# Patient Record
Sex: Female | Born: 1940 | Race: White | Hispanic: No | Marital: Married | Smoking: Never smoker
Health system: Southern US, Community
[De-identification: ages and names within clinical notes are randomized; demographics above are authoritative.]

## PROBLEM LIST (undated history)

## (undated) DIAGNOSIS — E039 Hypothyroidism, unspecified: Secondary | ICD-10-CM

## (undated) DIAGNOSIS — I350 Nonrheumatic aortic (valve) stenosis: Secondary | ICD-10-CM

## (undated) DIAGNOSIS — K589 Irritable bowel syndrome without diarrhea: Secondary | ICD-10-CM

## (undated) DIAGNOSIS — H3530 Unspecified macular degeneration: Secondary | ICD-10-CM

## (undated) DIAGNOSIS — E079 Disorder of thyroid, unspecified: Secondary | ICD-10-CM

## (undated) DIAGNOSIS — I10 Essential (primary) hypertension: Secondary | ICD-10-CM

## (undated) DIAGNOSIS — R52 Pain, unspecified: Secondary | ICD-10-CM

## (undated) DIAGNOSIS — M1990 Unspecified osteoarthritis, unspecified site: Secondary | ICD-10-CM

## (undated) DIAGNOSIS — E785 Hyperlipidemia, unspecified: Secondary | ICD-10-CM

## (undated) DIAGNOSIS — E538 Deficiency of other specified B group vitamins: Secondary | ICD-10-CM

## (undated) DIAGNOSIS — R011 Cardiac murmur, unspecified: Secondary | ICD-10-CM

## (undated) DIAGNOSIS — O039 Complete or unspecified spontaneous abortion without complication: Secondary | ICD-10-CM

## (undated) HISTORY — DX: Cardiac murmur, unspecified: R01.1

## (undated) HISTORY — PX: EYE SURGERY: SHX253

## (undated) HISTORY — DX: Disorder of thyroid, unspecified: E07.9

## (undated) HISTORY — DX: Hyperlipidemia, unspecified: E78.5

## (undated) HISTORY — DX: Nonrheumatic aortic (valve) stenosis: I35.0

## (undated) HISTORY — DX: Irritable bowel syndrome, unspecified: K58.9

## (undated) HISTORY — PX: CATARACT EXTRACTION: SUR2

## (undated) HISTORY — DX: Essential (primary) hypertension: I10

## (undated) HISTORY — PX: OTHER SURGICAL HISTORY: SHX169

## (undated) HISTORY — DX: Complete or unspecified spontaneous abortion without complication: O03.9

## (undated) HISTORY — DX: Pain, unspecified: R52

## (undated) HISTORY — PX: DILATION AND CURETTAGE OF UTERUS: SHX78

## (undated) HISTORY — DX: Unspecified macular degeneration: H35.30

## (undated) HISTORY — DX: Deficiency of other specified B group vitamins: E53.8

## (undated) HISTORY — PX: THYROIDECTOMY: SHX17

## (undated) HISTORY — PX: SHOULDER SURGERY: SHX246

---

## 1975-07-11 HISTORY — PX: ABDOMINAL HYSTERECTOMY: SHX81

## 1997-10-23 ENCOUNTER — Inpatient Hospital Stay (HOSPITAL_COMMUNITY): Admission: RE | Admit: 1997-10-23 | Discharge: 1997-10-24 | Payer: Self-pay

## 1997-10-26 ENCOUNTER — Ambulatory Visit (HOSPITAL_COMMUNITY): Admission: RE | Admit: 1997-10-26 | Discharge: 1997-10-26 | Payer: Self-pay

## 1997-11-11 ENCOUNTER — Ambulatory Visit (HOSPITAL_COMMUNITY): Admission: RE | Admit: 1997-11-11 | Discharge: 1997-11-11 | Payer: Self-pay

## 1997-12-24 ENCOUNTER — Other Ambulatory Visit: Admission: RE | Admit: 1997-12-24 | Discharge: 1997-12-24 | Payer: Self-pay | Admitting: Gynecology

## 1998-12-28 ENCOUNTER — Other Ambulatory Visit: Admission: RE | Admit: 1998-12-28 | Discharge: 1998-12-28 | Payer: Self-pay | Admitting: Gynecology

## 1999-01-13 ENCOUNTER — Encounter: Admission: RE | Admit: 1999-01-13 | Discharge: 1999-04-13 | Payer: Self-pay

## 2000-01-06 ENCOUNTER — Other Ambulatory Visit: Admission: RE | Admit: 2000-01-06 | Discharge: 2000-01-06 | Payer: Self-pay | Admitting: Gynecology

## 2000-03-25 ENCOUNTER — Other Ambulatory Visit: Admission: RE | Admit: 2000-03-25 | Discharge: 2000-03-25 | Payer: Self-pay | Admitting: Gynecology

## 2000-04-12 ENCOUNTER — Other Ambulatory Visit: Admission: RE | Admit: 2000-04-12 | Discharge: 2000-04-12 | Payer: Self-pay | Admitting: Gynecology

## 2000-04-12 ENCOUNTER — Encounter (INDEPENDENT_AMBULATORY_CARE_PROVIDER_SITE_OTHER): Payer: Self-pay | Admitting: Specialist

## 2001-02-01 ENCOUNTER — Other Ambulatory Visit: Admission: RE | Admit: 2001-02-01 | Discharge: 2001-02-01 | Payer: Self-pay | Admitting: Gynecology

## 2001-03-14 ENCOUNTER — Other Ambulatory Visit: Admission: RE | Admit: 2001-03-14 | Discharge: 2001-03-14 | Payer: Self-pay | Admitting: Gynecology

## 2001-05-22 ENCOUNTER — Encounter: Admission: RE | Admit: 2001-05-22 | Discharge: 2001-05-22 | Payer: Self-pay | Admitting: Family Medicine

## 2001-05-22 ENCOUNTER — Encounter: Payer: Self-pay | Admitting: Family Medicine

## 2002-02-12 ENCOUNTER — Other Ambulatory Visit: Admission: RE | Admit: 2002-02-12 | Discharge: 2002-02-12 | Payer: Self-pay | Admitting: Gynecology

## 2002-02-27 ENCOUNTER — Ambulatory Visit (HOSPITAL_BASED_OUTPATIENT_CLINIC_OR_DEPARTMENT_OTHER): Admission: RE | Admit: 2002-02-27 | Discharge: 2002-02-27 | Payer: Self-pay | Admitting: Orthopedic Surgery

## 2003-02-17 ENCOUNTER — Other Ambulatory Visit: Admission: RE | Admit: 2003-02-17 | Discharge: 2003-02-17 | Payer: Self-pay | Admitting: Gynecology

## 2004-02-18 ENCOUNTER — Other Ambulatory Visit: Admission: RE | Admit: 2004-02-18 | Discharge: 2004-02-18 | Payer: Self-pay | Admitting: Gynecology

## 2005-02-21 ENCOUNTER — Other Ambulatory Visit: Admission: RE | Admit: 2005-02-21 | Discharge: 2005-02-21 | Payer: Self-pay | Admitting: Gynecology

## 2005-02-27 ENCOUNTER — Ambulatory Visit (HOSPITAL_COMMUNITY): Admission: RE | Admit: 2005-02-27 | Discharge: 2005-02-27 | Payer: Self-pay | Admitting: Specialist

## 2005-02-27 ENCOUNTER — Encounter (INDEPENDENT_AMBULATORY_CARE_PROVIDER_SITE_OTHER): Payer: Self-pay | Admitting: Specialist

## 2005-02-27 ENCOUNTER — Ambulatory Visit (HOSPITAL_BASED_OUTPATIENT_CLINIC_OR_DEPARTMENT_OTHER): Admission: RE | Admit: 2005-02-27 | Discharge: 2005-02-27 | Payer: Self-pay | Admitting: Specialist

## 2005-08-17 ENCOUNTER — Encounter: Admission: RE | Admit: 2005-08-17 | Discharge: 2005-11-15 | Payer: Self-pay | Admitting: Family Medicine

## 2006-02-21 ENCOUNTER — Other Ambulatory Visit: Admission: RE | Admit: 2006-02-21 | Discharge: 2006-02-21 | Payer: Self-pay | Admitting: Gynecology

## 2007-02-25 ENCOUNTER — Other Ambulatory Visit: Admission: RE | Admit: 2007-02-25 | Discharge: 2007-02-25 | Payer: Self-pay | Admitting: Gynecology

## 2007-07-11 HISTORY — PX: DOPPLER ECHOCARDIOGRAPHY: SHX263

## 2008-01-28 ENCOUNTER — Ambulatory Visit: Payer: Self-pay | Admitting: Gastroenterology

## 2008-02-18 ENCOUNTER — Ambulatory Visit: Payer: Self-pay | Admitting: Gastroenterology

## 2008-02-18 ENCOUNTER — Encounter: Payer: Self-pay | Admitting: Gastroenterology

## 2008-02-21 ENCOUNTER — Encounter: Payer: Self-pay | Admitting: Gastroenterology

## 2008-02-24 ENCOUNTER — Other Ambulatory Visit: Admission: RE | Admit: 2008-02-24 | Discharge: 2008-02-24 | Payer: Self-pay | Admitting: Gynecology

## 2008-04-02 ENCOUNTER — Ambulatory Visit: Payer: Self-pay | Admitting: Gynecology

## 2008-08-19 ENCOUNTER — Encounter: Admission: RE | Admit: 2008-08-19 | Discharge: 2008-08-19 | Payer: Self-pay | Admitting: Internal Medicine

## 2008-10-07 ENCOUNTER — Encounter: Admission: RE | Admit: 2008-10-07 | Discharge: 2008-10-07 | Payer: Self-pay | Admitting: Orthopaedic Surgery

## 2009-03-02 ENCOUNTER — Encounter: Payer: Self-pay | Admitting: Gynecology

## 2009-03-02 ENCOUNTER — Ambulatory Visit: Payer: Self-pay | Admitting: Gynecology

## 2009-03-02 ENCOUNTER — Other Ambulatory Visit: Admission: RE | Admit: 2009-03-02 | Discharge: 2009-03-02 | Payer: Self-pay | Admitting: Gynecology

## 2009-04-06 ENCOUNTER — Ambulatory Visit: Payer: Self-pay | Admitting: Gynecology

## 2010-03-03 ENCOUNTER — Other Ambulatory Visit: Admission: RE | Admit: 2010-03-03 | Discharge: 2010-03-03 | Payer: Self-pay | Admitting: Gynecology

## 2010-03-03 ENCOUNTER — Ambulatory Visit: Payer: Self-pay | Admitting: Gynecology

## 2010-03-19 ENCOUNTER — Encounter: Admission: RE | Admit: 2010-03-19 | Discharge: 2010-03-19 | Payer: Self-pay | Admitting: Orthopaedic Surgery

## 2010-07-12 ENCOUNTER — Inpatient Hospital Stay (HOSPITAL_COMMUNITY)
Admission: RE | Admit: 2010-07-12 | Discharge: 2010-07-14 | Payer: Self-pay | Source: Home / Self Care | Attending: Orthopaedic Surgery | Admitting: Orthopaedic Surgery

## 2010-07-12 HISTORY — PX: TOTAL KNEE ARTHROPLASTY: SHX125

## 2010-07-13 LAB — CBC
HCT: 29.1 % — ABNORMAL LOW (ref 36.0–46.0)
Hemoglobin: 9.8 g/dL — ABNORMAL LOW (ref 12.0–15.0)
MCH: 29.5 pg (ref 26.0–34.0)
MCHC: 33.7 g/dL (ref 30.0–36.0)
MCV: 87.7 fL (ref 78.0–100.0)
Platelets: 264 10*3/uL (ref 150–400)
RBC: 3.32 MIL/uL — ABNORMAL LOW (ref 3.87–5.11)
RDW: 14.1 % (ref 11.5–15.5)
WBC: 7.7 10*3/uL (ref 4.0–10.5)

## 2010-07-13 LAB — BASIC METABOLIC PANEL
BUN: 12 mg/dL (ref 6–23)
CO2: 31 mEq/L (ref 19–32)
Calcium: 8.5 mg/dL (ref 8.4–10.5)
Chloride: 102 mEq/L (ref 96–112)
Creatinine, Ser: 0.74 mg/dL (ref 0.4–1.2)
GFR calc Af Amer: >60 mL/min (ref >60)
GFR calc non Af Amer: >60 mL/min (ref >60)
Glucose, Bld: 88 mg/dL (ref 70–99)
Potassium: 4.1 mEq/L (ref 3.5–5.1)
Sodium: 138 mEq/L (ref 135–145)

## 2010-07-13 LAB — HEMOGLOBIN A1C
Hgb A1c MFr Bld: 5.8 % — ABNORMAL HIGH (ref <5.7)
Mean Plasma Glucose: 120 mg/dL — ABNORMAL HIGH (ref <117)

## 2010-07-13 LAB — PROTIME-INR
INR: 1.08 (ref 0.00–1.49)
Prothrombin Time: 14.2 seconds (ref 11.6–15.2)

## 2010-07-13 LAB — GLUCOSE, CAPILLARY
Glucose-Capillary: 90 mg/dL (ref 70–99)
Glucose-Capillary: 121 mg/dL — ABNORMAL HIGH (ref 70–99)
Glucose-Capillary: 130 mg/dL — ABNORMAL HIGH (ref 70–99)

## 2010-07-14 LAB — PROTIME-INR
INR: 1.62 — ABNORMAL HIGH (ref 0.00–1.49)
Prothrombin Time: 19.4 seconds — ABNORMAL HIGH (ref 11.6–15.2)

## 2010-07-14 LAB — BASIC METABOLIC PANEL
BUN: 11 mg/dL (ref 6–23)
CO2: 32 mEq/L (ref 19–32)
Calcium: 8.5 mg/dL (ref 8.4–10.5)
Chloride: 100 mEq/L (ref 96–112)
Creatinine, Ser: 0.8 mg/dL (ref 0.4–1.2)
GFR calc Af Amer: >60 mL/min (ref >60)
GFR calc non Af Amer: >60 mL/min (ref >60)
Glucose, Bld: 116 mg/dL — ABNORMAL HIGH (ref 70–99)
Potassium: 4 mEq/L (ref 3.5–5.1)
Sodium: 137 mEq/L (ref 135–145)

## 2010-07-14 LAB — CBC
HCT: 27.3 % — ABNORMAL LOW (ref 36.0–46.0)
Hemoglobin: 9.1 g/dL — ABNORMAL LOW (ref 12.0–15.0)
MCH: 29.2 pg (ref 26.0–34.0)
MCHC: 33.3 g/dL (ref 30.0–36.0)
MCV: 87.5 fL (ref 78.0–100.0)
Platelets: 247 10*3/uL (ref 150–400)
RBC: 3.12 MIL/uL — ABNORMAL LOW (ref 3.87–5.11)
RDW: 14.3 % (ref 11.5–15.5)
WBC: 11.1 10*3/uL — ABNORMAL HIGH (ref 4.0–10.5)

## 2010-07-14 LAB — GLUCOSE, CAPILLARY
Glucose-Capillary: 106 mg/dL — ABNORMAL HIGH (ref 70–99)
Glucose-Capillary: 117 mg/dL — ABNORMAL HIGH (ref 70–99)

## 2010-08-04 NOTE — Discharge Summary (Addendum)
NAME:  Dana Sandoval, Dana Sandoval              ACCOUNT NO.:  [id number]  MEDICAL RECORD NO.:  [id number]          PATIENT TYPE:  INP  LOCATION:  5006                         FACILITY:  [facility]  PHYSICIAN:  Lubertha Basque. Krissia Schreier, M.D.DATE OF BIRTH:  Nov 14, 1940  DATE OF ADMISSION:  07/12/2010 DATE OF DISCHARGE:  07/15/2010                              DISCHARGE SUMMARY   ADMITTING DIAGNOSES: 1. Right knee end-stage degenerative joint disease. 2. Adult-onset diabetes mellitus. 3. Hypothyroidism. 4. Hypercholesterolemia. 5. Hypertension.  DISCHARGE DIAGNOSES: 1. Right knee end-stage degenerative joint disease. 2. Adult-onset diabetes mellitus. 3. Hypothyroidism. 4. Hypercholesterolemia. 5. Hypertension.  OPERATIONS:  Right total knee replacement.  BRIEF HISTORY:  Dana Sandoval is a patient well known to our practice, 70 years old.  She has had increasing right knee discomfort and pain, trouble ambulating, trouble sleeping at nighttime.  X-rays reveal end- stage DJD, bone-on-bone arthritis.  She has failed antiinflammatory medicines as well as viscosupplementation injections and cortisone.  PERTINENT LABORATORY DATA AND X-RAY FINDINGS:  Last INR 1.62. Hemoglobin 9.1, hematocrit 27.3, and WBCs 11.1.  Sodium 137, glucose 116, BUN 11, and creatinine 0.80.  HOSPITAL COURSE:  She was admitted postoperatively and placed on the preprinted order set sheets for total joint replacement protocol.  Diet was advanced as tolerated.  IV of lactated Ringer's Ancef 1 g q.8 h. x3 doses, Lovenox and Coumadin per pharmacy DVT prophylaxis, and appropriate p.o. and IV pain medications, antiemetics, antispasmodics, physical therapy, weightbearing as tolerated, knee-high TED, incentive spirometer all were incorporated.  [facility] was the agency designated to provide home health, physical therapy and blood draws, and Coumadin management with an INR between 2 and 3 for 14 days' total.  On the first day  postop, her vital signs were stable, blood pressure was normal, temperature 98.  Lungs were clear.  Abdomen was soft.  Right knee with a drain in place.  Normal neurovascular status without sign of infection.  Catheter was discontinued later that day, voiding on her own without difficulty.  On the second day postop, she progressed well with physical therapy, was walking some 90 feet, and was wanting to go home.  Lungs were clear.  Vital signs were stable. Hemoglobin 9.1, INR 1.62.  Dressing was changed.  Drain was pulled. Wound was noted to be benign without sign of infection.  Calf soft and nontender.  Abdomen with positive bowel sounds.  She was passing gas, but had no bowel movement.  CONDITION ON DISCHARGE:  Improved.  FOLLOWUP:  She will remain on a low-sodium, heart-healthy diet. Medicines will be inclusive in the discharge medicine manager summary. Weightbearing as tolerated.  Genevieve Norlander for physical therapy.  May change her dressing daily.  Return to Dr. Nolon Nations office in 2 weeks from the time of surgery for staple removal.  Any sign of infection which includes increasing redness, drainage, or pain, call the office immediately at [phone] for an immediate appointment.     Lindwood Qua, P.A.   ______________________________ Lubertha Basque. Jerl Santos, M.D.    MC/MEDQ  D:  07/14/2010  T:  07/15/2010  Job:  [id number]  Electronically Signed by Casimiro Needle CARNAGHI P.A.  on 07/19/2010 10:31:05 AM Electronically Signed by Marcene Corning M.D. on 08/04/2010 03:15:34 PM

## 2010-09-19 LAB — BASIC METABOLIC PANEL
CO2: 31 mEq/L (ref 19–32)
Calcium: 9 mg/dL (ref 8.4–10.5)
Chloride: 103 mEq/L (ref 96–112)
Creatinine, Ser: 0.69 mg/dL (ref 0.4–1.2)
Glucose, Bld: 87 mg/dL (ref 70–99)
Sodium: 141 mEq/L (ref 135–145)

## 2010-09-19 LAB — CBC
Hemoglobin: 11.9 g/dL — ABNORMAL LOW (ref 12.0–15.0)
MCH: 29 pg (ref 26.0–34.0)
MCHC: 32.9 g/dL (ref 30.0–36.0)
MCV: 88.3 fL (ref 78.0–100.0)

## 2010-09-19 LAB — GLUCOSE, CAPILLARY
Glucose-Capillary: 136 mg/dL — ABNORMAL HIGH (ref 70–99)
Glucose-Capillary: 142 mg/dL — ABNORMAL HIGH (ref 70–99)

## 2010-09-19 LAB — PROTIME-INR
INR: 0.98 (ref 0.00–1.49)
Prothrombin Time: 13.2 seconds (ref 11.6–15.2)

## 2010-09-19 LAB — SURGICAL PCR SCREEN: Staphylococcus aureus: POSITIVE — AB

## 2010-11-25 NOTE — Op Note (Signed)
NAME:  Dana Sandoval, Dana Sandoval                        ACCOUNT NO.:  [id number]   MEDICAL RECORD NO.:  [id number]                   PATIENT TYPE:  AMB   LOCATION:  DSC                                  FACILITY:  [facility]   PHYSICIAN:  Loreta Ave, M.D.              DATE OF BIRTH:  11-03-1940   DATE OF PROCEDURE:  02/27/2002  DATE OF DISCHARGE:                                 OPERATIVE REPORT   PREOPERATIVE DIAGNOSES:  Degenerative arthritis, chondromalacia of the  patella, and medial and lateral meniscus tears, left knee.   POSTOPERATIVE DIAGNOSES:  Degenerative arthritis, chondromalacia of the  patella, and medial and lateral meniscus tears, left knee.   PROCEDURES:  Left knee examination under anesthesia and arthroscopy with  partial medial and lateral meniscectomy with chondroplasty, medial and  lateral compartments.  More extensive chondroplasty, patellofemoral joint.   SURGEON:  Loreta Ave, M.D.   ASSISTANT:  Arlys John D. Petrarca, P.A.-C.   ANESTHESIA:  General.   ESTIMATED BLOOD LOSS:  Minimal.   SPECIMENS:  None.   CULTURES:  None.   COMPLICATIONS:  None.   DRESSING:  Soft compressive.   TOURNIQUET:  Not employed.   DESCRIPTION OF PROCEDURE:  The patient brought to the operating room and  placed on the operating table in supine position.  After adequate anesthesia  had been obtained, tourniquet and leg holder applied, left leg.  Full motion  and stable knee.  Patellofemoral crepitus but without instability.  After  being prepped and draped in the usual sterile fashion, three portals  created, one superolateral, one each medial and lateral peripatellar.  Inflow catheter introduced, knee distended, arthroscope introduced, knee  inspected.  Extensive grade 3 changes, patellofemoral joint, treated with  chondroplasty to a stable surface.  No maltracking, no tethering.  Medial  compartment accessed.  Extensive degenerative tearing, posterior half medial  meniscus.   Saucerized out with baskets, tapered in smoothly with a shaver,  removing most of the posterior half.  Chondroplasty of grade 2 and 3  changes, femoral condyle.  Cruciate ligaments intact.  Lateral compartment  revealed degenerative tearing with a large flap off the posterior half of  the lateral meniscus.  This was saucerized out as well as tearing in the  central part saucerized out, tapered in smoothly.  Remaining meniscus  intact.  Grade 2 changes on the plateau debrided.  Hypertrophic synovitis  removed  throughout.  The entire knee inspected and no other significant findings  appreciated.  Instruments and fluid removed.  Portals and knee injected with  Marcaine.  Sterile compressive dressing applied after portals closed with  nylon.  Anesthesia reversed.  Brought to the recovery room.  Tolerated the  surgery well with no complications.  Loreta Ave, M.D.    DFM/MEDQ  D:  02/27/2002  T:  03/03/2002  Job:  [id number]

## 2010-11-25 NOTE — Op Note (Signed)
NAME:  Dana Sandoval, Dana Sandoval              ACCOUNT NO.:  [id number]   MEDICAL RECORD NO.:  [id number]          PATIENT TYPE:  AMB   LOCATION:  DSC                          FACILITY:  [facility]   PHYSICIAN:  Earvin Hansen L. Truesdale, M.D.DATE OF BIRTH:  11-30-1940   DATE OF PROCEDURE:  02/27/2005  DATE OF DISCHARGE:                                 OPERATIVE REPORT   INDICATIONS:  The patient is a 70 year old lady with enlarging masses about  the inner knee areas. They are large, measuring approximately 12 x 18  inches.  The areas have caused problems with the patient flexion of her knee  as well as causing irritation and rubbing when she walks.  She has had to  use medications in the area including topical steroids, lubricants and talcs  to reduce the friction as she walks.  The areas have gotten to the point of  increased pain and discomfort.   OPERATION/PROCEDURE:  Excision of the areas, liposuction assistance.   ANESTHESIA:  General.   DESCRIPTION OF PROCEDURE:  Preoperatively the patient was stood up and drawn  for the total extent of the masses on the right and left inner knees.  She  then underwent general anesthesia, was intubated orally.  Prep was done to  the thigh, feet, and leg areas with Hibiclens solution, walled off with  sterile towels and drapes so as to make a sterile field.  Xylocaine 0.5%,  1:200,000 concentration was injected locally within each area.  Incisions  were made on the areas and able to remove the areas with liposuction  assistance except for the central core.  The central core seemed to be  lipomatous and was also under the superficial fascia.  Incisions were made  over that with a knife and then using Metzenbaum scissors, sharp and blunt  dissection.  We were then able to dissect out that area which measured  approximately 4 x 6 inches.  Liposuction assistance was then used to remove  the peripheral part and the margins to smooth out the contour.  Same  procedure was  carried out on both sides.  The incisions were closed with 5-0  nylon except one to allow drainage.  The wounds were covered with Xeroform,  4 x 4's, ABDs, Hypafix, and Kerlix wraps, and Ace wraps.  She withstood the  procedures very well and was taken to recovery in excellent condition.  Estimated blood loss was less than 50 mL.  Tissues removed:  Over 1000 mL.      Yaakov Guthrie. Shon Hough, M.D.  Electronically Signed     GLT/MEDQ  D:  02/27/2005  T:  02/27/2005  Job:  [id number]

## 2011-02-14 ENCOUNTER — Other Ambulatory Visit: Payer: Self-pay | Admitting: *Deleted

## 2011-02-14 DIAGNOSIS — Z139 Encounter for screening, unspecified: Secondary | ICD-10-CM

## 2011-02-16 ENCOUNTER — Telehealth: Payer: Self-pay | Admitting: Cardiology

## 2011-02-16 DIAGNOSIS — I350 Nonrheumatic aortic (valve) stenosis: Secondary | ICD-10-CM

## 2011-02-16 NOTE — Telephone Encounter (Signed)
Chart in box 

## 2011-02-16 NOTE — Telephone Encounter (Signed)
Scheduled echo and ov for patient

## 2011-02-16 NOTE — Telephone Encounter (Signed)
Pt was supposed to schedule a 1year OV in March, pt now calling to schedule however pt would like to know if she needs to have a certain test that she doesn't remember what it is called prior to scheduling her new yearly OV, please call patient back to confirm what she will need to do or schedule prior to scheduling a new yearly OV

## 2011-02-24 ENCOUNTER — Encounter: Payer: Self-pay | Admitting: *Deleted

## 2011-03-03 ENCOUNTER — Encounter: Payer: Self-pay | Admitting: Cardiology

## 2011-03-08 ENCOUNTER — Encounter: Payer: Self-pay | Admitting: Gynecology

## 2011-03-10 ENCOUNTER — Encounter: Payer: Self-pay | Admitting: Gynecology

## 2011-03-15 ENCOUNTER — Encounter: Payer: Self-pay | Admitting: Gynecology

## 2011-03-16 ENCOUNTER — Ambulatory Visit (HOSPITAL_COMMUNITY): Payer: Medicare Other | Attending: Cardiology | Admitting: Radiology

## 2011-03-16 DIAGNOSIS — E669 Obesity, unspecified: Secondary | ICD-10-CM | POA: Insufficient documentation

## 2011-03-16 DIAGNOSIS — E785 Hyperlipidemia, unspecified: Secondary | ICD-10-CM | POA: Insufficient documentation

## 2011-03-16 DIAGNOSIS — E119 Type 2 diabetes mellitus without complications: Secondary | ICD-10-CM | POA: Insufficient documentation

## 2011-03-16 DIAGNOSIS — I350 Nonrheumatic aortic (valve) stenosis: Secondary | ICD-10-CM

## 2011-03-16 DIAGNOSIS — I359 Nonrheumatic aortic valve disorder, unspecified: Secondary | ICD-10-CM

## 2011-03-16 DIAGNOSIS — I080 Rheumatic disorders of both mitral and aortic valves: Secondary | ICD-10-CM | POA: Insufficient documentation

## 2011-03-16 DIAGNOSIS — I079 Rheumatic tricuspid valve disease, unspecified: Secondary | ICD-10-CM | POA: Insufficient documentation

## 2011-03-17 ENCOUNTER — Encounter: Payer: Self-pay | Admitting: Gynecology

## 2011-03-17 ENCOUNTER — Other Ambulatory Visit (HOSPITAL_COMMUNITY)
Admission: RE | Admit: 2011-03-17 | Discharge: 2011-03-17 | Disposition: A | Discharge status: Home / Self Care | Payer: Medicare Other | Source: Ambulatory Visit | Attending: Gynecology | Admitting: Gynecology

## 2011-03-17 ENCOUNTER — Ambulatory Visit (INDEPENDENT_AMBULATORY_CARE_PROVIDER_SITE_OTHER): Payer: Medicare Other | Admitting: Gynecology

## 2011-03-17 DIAGNOSIS — M8580 Other specified disorders of bone density and structure, unspecified site: Secondary | ICD-10-CM

## 2011-03-17 DIAGNOSIS — M949 Disorder of cartilage, unspecified: Secondary | ICD-10-CM

## 2011-03-17 DIAGNOSIS — Z01419 Encounter for gynecological examination (general) (routine) without abnormal findings: Secondary | ICD-10-CM

## 2011-03-17 DIAGNOSIS — N952 Postmenopausal atrophic vaginitis: Secondary | ICD-10-CM

## 2011-03-17 DIAGNOSIS — Z124 Encounter for screening for malignant neoplasm of cervix: Secondary | ICD-10-CM | POA: Insufficient documentation

## 2011-03-17 DIAGNOSIS — Z1211 Encounter for screening for malignant neoplasm of colon: Secondary | ICD-10-CM

## 2011-03-17 LAB — POC HEMOCCULT BLD/STL (OFFICE/1-CARD/DIAGNOSTIC): Fecal Occult Blood, POC: NEGATIVE

## 2011-03-17 NOTE — Progress Notes (Signed)
Dana Sandoval 1941-01-19 [id number]   History:    70 y.o.  for presented to the office today for her gynecological exam and to discuss recent bone density study due to the fact she's had a history of osteopenia in the past. She's been followed by Dr. Merri Brunette her internist who has been following her for hypercholesterolemia type 2 diabetes and hypothyroidism. Lab work was done by Dr. Katrinka Blazing last month sono additional lab work will be drawn today. She had a right total knee replacement in January of 2012. Her mammogram was done August 30 which was normal. She does her monthly self breast examinations. Patient been followed by the cardiologist for an aortic stenosis and has a followup appointment tomorrow to discuss recent echocardiogram. Her bone density study was done 03/09/2011 with a low as bone mineralization was at the right hip with a T score of -1.0 when compared with previous study she is statistically significant increasing bone mineralization of the lumbar spine could be attributed to degenerative sclerosis there has been slight interval decrease and bone mineralization of the left femoral neck.Frax analysis to determine patient's 10 year or fracture risk was under threshold does not requiring any additional treatment besides calcium and vitamin D .patient's colonoscopy was 2 years ago with Dr. Jarold Motto patient states that it was normal I to have a copy of that report.   Past medical history,surgical history, family history and social history were all reviewed and documented in the EPIC chart.  ROS:  Was performed and pertinent positives and negatives are included in the history.  Exam: chaperone present BP 128/80  Ht 5' 2.5" (1.588 m)  Wt 211 lb (95.709 kg)  BMI 37.98 kg/m2  Body mass index is 37.98 kg/(m^2).  General appearance : Well developed well nourished female. No acute distress HEENT: Neck supple, trachea midline, no carotid bruits, no thyroidmegaly Lungs: Clear to  auscultation, no rhonchi or wheezes, or rib retractions  Heart: systolic ejection murmur grade 3/6 left parasternal border  Breast:Examined in sitting and supine position were symmetrical in appearance, no palpable masses or tenderness,  no skin retraction, no nipple inversion, no nipple discharge, no skin discoloration, no axillary or supraclavicular lymphadenopathy Abdomen: no palpable masses or tenderness, no rebound or guarding Extremities: no edema or skin discoloration or tenderness  Pelvic:  Bartholin, Urethra, Skene Glands: Within normal limits             Vagina: No gross lesions or discharge  Cervix: absent   Uterus  absent   Adnexa  Without masses or tenderness  Anus and perineum  normal   Rectovaginal  normal sphincter tone without palpated masses or tenderness             Hemoccult  done pending at time of this dictation      Assessment/Plan:  70 y.o. female with unremarkable gynecological examination with the exception of vaginal atrophy. And a grade 3/6 systolic ejection murmur left parasternal border which is being followed by the cardiologist. She was instructed to take her calcium vitamin D and continue to engage in weightbearing exercises. Pap smear and Hemoccult testing was done today results pending at time of this dictation. We'll see her in one year or when necessary.    Ok Edwards MD, 11:18 AM 03/17/2011

## 2011-03-20 ENCOUNTER — Encounter: Payer: Self-pay | Admitting: Cardiology

## 2011-03-20 ENCOUNTER — Ambulatory Visit (INDEPENDENT_AMBULATORY_CARE_PROVIDER_SITE_OTHER): Payer: Medicare Other | Admitting: Cardiology

## 2011-03-20 DIAGNOSIS — E119 Type 2 diabetes mellitus without complications: Secondary | ICD-10-CM | POA: Insufficient documentation

## 2011-03-20 DIAGNOSIS — E785 Hyperlipidemia, unspecified: Secondary | ICD-10-CM | POA: Insufficient documentation

## 2011-03-20 DIAGNOSIS — I350 Nonrheumatic aortic (valve) stenosis: Secondary | ICD-10-CM | POA: Insufficient documentation

## 2011-03-20 DIAGNOSIS — I359 Nonrheumatic aortic valve disorder, unspecified: Secondary | ICD-10-CM

## 2011-03-20 NOTE — Progress Notes (Signed)
Dana Sandoval Date of Birth:  Feb 19, 1941 [facility] [address]   [address] [place], [place]  [address] [phone]           Fax   [phone]  History of Present Illness: This pleasant 70 year old Caucasian female is seen for a scheduled one year followup office visit.  She has a history of aortic stenosis.  She does not have any history of ischemic heart disease.  She had a normal LexiScan Cardiolite stress test on 09/13/09 which showed no ischemia and showed an ejection fraction of 83% and no regional wall motion abnormalities.  Her last echocardiogram was 09/07/09 showing mild left ventricular hypertrophy with normal left ventricle systolic function and with impaired relaxation as well as moderate to severe aortic stenosis with mild aortic insufficiency and moderate mitral annular calcification with trace mitral regurgitation.  Her right ventricular systolic pressure was slightly elevated at 39 mm mercury.  Since last visit she's had no intercurrent cardiac symptoms.  She underwent a successful right knee replacement in January 2000 and  Current Outpatient Prescriptions  Medication Sig Dispense Refill  . aspirin 81 MG tablet Take 81 mg by mouth daily. Every other night      . calcium gluconate 500 MG tablet Take 500 mg by mouth every morning.        . Cholecalciferol (VITAMIN D PO) Take by mouth every morning.        . ezetimibe-simvastatin (VYTORIN) 10-40 MG per tablet Take 1 tablet by mouth daily.        . Latanoprost (XALATAN OP) Apply to eye every morning.        Marland Kitchen levothyroxine (SYNTHROID, LEVOTHROID) 100 MCG tablet Take 100 mcg by mouth daily.        Marland Kitchen NITROGLYCERIN PO Take by mouth as needed.        . Omega-3 Fatty Acids (FISH OIL PO) Take by mouth daily.        . pioglitazone-metformin (ACTOPLUS MET) 15-500 MG per tablet Take 1 tablet by mouth 2 (two) times daily with a meal.        . ramipril (ALTACE) 10 MG capsule Take 10 mg by mouth daily.           Allergies  Allergen Reactions  . Codeine Itching  . Dilaudid (Hydromorphone Hcl) Itching    Patient Active Problem List  Diagnoses  . Osteopenia of the elderly    History  Smoking status  . Never Smoker   Smokeless tobacco  . Never Used    History  Alcohol Use No    Family History  Problem Relation Age of Onset  . Osteoporosis Mother   . Heart disease Mother   . Heart disease Father   . Cancer Father     BRAIN TUMOR  . Diabetes Maternal Aunt   . Diabetes Maternal Uncle     Review of Systems: Constitutional: no fever chills diaphoresis or fatigue or change in weight.  Head and neck: no hearing loss, no epistaxis, no photophobia or visual disturbance. Respiratory: No cough, shortness of breath or wheezing. Cardiovascular: No chest pain peripheral edema, palpitations. Gastrointestinal: No abdominal distention, no abdominal pain, no change in bowel habits hematochezia or melena. Genitourinary: No dysuria, no frequency, no urgency, no nocturia. Musculoskeletal:No arthralgias, no back pain, no gait disturbance or myalgias. Neurological: No dizziness, no headaches, no numbness, no seizures, no syncope, no weakness, no tremors. Hematologic: No lymphadenopathy, no easy bruising. Psychiatric: No confusion, no hallucinations,  no sleep disturbance.    Physical Exam: Filed Vitals:   03/20/11 1016  BP: 136/60  Pulse: 84   The general appearance feels a well-developed well-nourished woman in no distress.Pupils equal and reactive.   Extraocular Movements are full.  There is no scleral icterus.  The mouth and pharynx are normal.  The neck is supple.  The carotids reveal no bruits.  The jugular venous pressure is normal.  The thyroid is not enlarged.  There is no lymphadenopathy.  The chest is clear to percussion and auscultation. There are no rales or rhonchi. Expansion of the chest is symmetrical.    The heart reveals a harsh grade 3/6 systolic ejection murmur at the  base radiating to both carotids.  Carotid upstroke is normal.  There is a soft early decrescendo murmur of aortic insufficiency.  There is no gallop or rub. The abdomen is soft and nontender. Bowel sounds are normal. The liver and spleen are not enlarged. There Are no abdominal masses. There are no bruits.  The pedal pulses are good.  There is no phlebitis or edema.  There is no cyanosis or clubbing.  Strength is normal and symmetrical in all extremities.  There is no lateralizing weakness.  There are no sensory deficits.  The skin is warm and dry.  There is no rash.    Assessment / Plan: We reviewed the results of her echocardiogram today.  She will continue her same medication and be rechecked in one year for an office visit and EKG and then depending on symptoms we may wish to schedule a subsequent echocardiogram.

## 2011-03-20 NOTE — Assessment & Plan Note (Signed)
The patient has known moderate to severe aortic stenosis with mild aortic insufficiency.  She had a recent update of her echocardiogram done on 03/16/11.  It showed that her aortic stenosis has not progressed.  Her main systolic gradient across the aortic valve had been 24 and presently is 23.  Her left ventricular function was normal with an ejection fraction of 55-60%.  She has not been experiencing any of the cardinal symptoms of symptomatic aortic stenosis such as exertional angina, congestive heart failure, or exertional dizziness or syncope

## 2011-03-20 NOTE — Assessment & Plan Note (Signed)
The patient is diabetic.  She is on oral agents.  She's not having any hypoglycemia.  Her diabetes is followed by Tomi Bamberger nurse practitioner and the patient states that her hemoglobin A1c has been satisfactory

## 2011-03-20 NOTE — Assessment & Plan Note (Addendum)
The patient has a past history of dyslipidemia.She is on Vytorin 10/40 one daily.  She is not having any side effects from the statin therapy

## 2011-04-07 LAB — GLUCOSE, CAPILLARY: Glucose-Capillary: 95

## 2011-08-03 ENCOUNTER — Encounter: Payer: Self-pay | Admitting: Cardiology

## 2011-10-18 ENCOUNTER — Other Ambulatory Visit: Payer: Self-pay | Admitting: Orthopaedic Surgery

## 2011-10-18 DIAGNOSIS — M25511 Pain in right shoulder: Secondary | ICD-10-CM

## 2011-10-21 ENCOUNTER — Ambulatory Visit
Admission: RE | Admit: 2011-10-21 | Discharge: 2011-10-21 | Disposition: A | Discharge status: Home / Self Care | Payer: Medicare Other | Source: Ambulatory Visit | Attending: Orthopaedic Surgery | Admitting: Orthopaedic Surgery

## 2011-10-21 DIAGNOSIS — M25511 Pain in right shoulder: Secondary | ICD-10-CM

## 2012-03-13 ENCOUNTER — Encounter: Payer: Self-pay | Admitting: Gynecology

## 2012-03-15 ENCOUNTER — Ambulatory Visit: Payer: Medicare Other | Admitting: Cardiology

## 2012-03-18 ENCOUNTER — Encounter: Payer: Self-pay | Admitting: Gynecology

## 2012-03-18 ENCOUNTER — Ambulatory Visit (INDEPENDENT_AMBULATORY_CARE_PROVIDER_SITE_OTHER): Payer: Medicare Other | Admitting: Gynecology

## 2012-03-18 VITALS — BP 140/86 | Ht 62.0 in | Wt 204.0 lb

## 2012-03-18 DIAGNOSIS — N952 Postmenopausal atrophic vaginitis: Secondary | ICD-10-CM

## 2012-03-18 DIAGNOSIS — R198 Other specified symptoms and signs involving the digestive system and abdomen: Secondary | ICD-10-CM

## 2012-03-18 DIAGNOSIS — R1900 Intra-abdominal and pelvic swelling, mass and lump, unspecified site: Secondary | ICD-10-CM

## 2012-03-18 DIAGNOSIS — R635 Abnormal weight gain: Secondary | ICD-10-CM

## 2012-03-18 DIAGNOSIS — Z8742 Personal history of other diseases of the female genital tract: Secondary | ICD-10-CM | POA: Insufficient documentation

## 2012-03-18 DIAGNOSIS — Z8262 Family history of osteoporosis: Secondary | ICD-10-CM | POA: Insufficient documentation

## 2012-03-18 DIAGNOSIS — M949 Disorder of cartilage, unspecified: Secondary | ICD-10-CM

## 2012-03-18 DIAGNOSIS — M8580 Other specified disorders of bone density and structure, unspecified site: Secondary | ICD-10-CM

## 2012-03-18 NOTE — Progress Notes (Addendum)
Dana Sandoval 06/30/1941 [id number]   History:    72 y.o.  who has a history of osteopenia and vaginal atrophy and weight gain for several years. She is under the care of Dr. Merri Brunette who has recently done her lab work since she has been monitoring patient's dyslipidemia and diabetes type 2. Dr. Simeon Craft (cardiologist) has been following patient for her history of aortic stenosis see previous entry in epic chart system for details. Patient's last colonoscopy was in 2009 which was normal but several years prior she had benign polyps. Back in 2009 she was followed for a small left ovarian cyst which eventually resolved and she had a normal CA 125. Last bone density study September 2012 with the following:  Right hip T score of -1.0 Frax analysis ten-year fracture risk of hip 0.6% and risk of any major osteoporotic fracture 7.5%. A value supple threshold not requiring any treatment at this time. Patient had been on Boniva in the past for 2 years only. 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 The left femoral neck had 1.7% decrease in bone mineralization. Mammogram early this month was normal. Patient states that she does her monthly self breast examination. Patient with no prior history of abnormal Pap smear. Review records indicated she was weighing 211 is now down to 204 pounds. She believes her dTap Vaccine is up-to-date and she will check with her primary physician. She has had issues in the past and vaginal dryness on and off.  Past medical history,surgical history, family history and social history were all reviewed and documented in the EPIC chart.  Gynecologic History No LMP recorded. Patient has had a hysterectomy. Contraception: none Last Pap: 2012. Results were: normal Last mammogram: 2013. Results were: normal  Obstetric History OB History    Grav Para Term  Preterm Abortions TAB SAB Ect Mult Living   6 4 4  2  2   4      # Outc Date GA Lbr Len/2nd Wgt Sex Del Anes PTL Lv   1 TRM     F SVD  No Yes   2 TRM     F SVD  No Yes   3 TRM     F SVD  No Yes   4 TRM     M SVD  No Yes   5 SAB            6 SAB                ROS: A ROS was performed and pertinent positives and negatives are included in the history.  GENERAL: No fevers or chills. HEENT: No change in vision, no earache, sore throat or sinus congestion. NECK: No pain or stiffness. CARDIOVASCULAR: No chest pain or pressure. No palpitations. PULMONARY: No shortness of breath, cough or wheeze. GASTROINTESTINAL: No abdominal pain, nausea, vomiting or diarrhea, melena or bright red blood per rectum. GENITOURINARY: No urinary frequency, urgency, hesitancy or dysuria. MUSCULOSKELETAL: No joint or muscle pain, no back pain, no recent trauma. DERMATOLOGIC: No rash, no itching, no lesions. ENDOCRINE: No polyuria, polydipsia, no heat or cold intolerance. No recent change in weight. HEMATOLOGICAL: No anemia or easy bruising or bleeding. NEUROLOGIC: No headache, seizures, numbness, tingling or weakness. PSYCHIATRIC: No depression, no loss of interest in normal activity or change in sleep pattern.     Exam: chaperone present  BP 140/86  Ht  5\' 2"  (1.575 m)  Wt 204 lb (92.534 kg)  BMI 37.31 kg/m2  Body mass index is 37.31 kg/(m^2).  General appearance : Well developed well nourished female. No acute distress HEENT: Neck supple, trachea midline, no carotid bruits, no thyroidmegaly Lungs: Clear to auscultation, no rhonchi or wheezes, or rib retractions  Heart: Systolic ejection murmur 3/6  Breast:Examined in sitting and supine position were symmetrical in appearance, no palpable masses or tenderness,  no skin retraction, no nipple inversion, no nipple discharge, no skin discoloration, no axillary or supraclavicular lymphadenopathy Abdomen: no palpable masses or tenderness, no rebound or  guarding Extremities: no edema or skin discoloration or tenderness  Pelvic:  Bartholin, Urethra, Skene Glands: Within normal limits             Vagina: No gross lesions or discharge  Cervix: Absent  Uterus  absent, limited exam due to patient's abdominal girth              Adnexa  no masses or tenderness, limited due to patient's abdominal girth  Anus and perineum  normal   Rectovaginal  normal sphincter tone without palpated masses or tenderness             Hemoccult cards provided for the patient to submit to the office for testing     Assessment/Plan:  71 y.o. with history of osteopenia but according to the Frax analysis she does not need to be put on any antiresorptive agent at the present time. She will continue with her calcium and vitamin D and active lifestyle. She is not due for her bone density study until next year. I've asked her to submit to the office the Hemoccult cards for testing. She will return back within the next week for an ultrasound for better assessment of her adnexa due to the fact that her abdominal girth makes it somewhat difficult and she has had history of ovarian cyst back in 2010. No lab work was done today.    Ok Edwards MD, 5:18 PM 03/18/2012

## 2012-03-18 NOTE — Patient Instructions (Addendum)
Cholesterol Control Diet  Cholesterol levels in your body are determined significantly by your diet. Cholesterol levels may also be related to heart disease. The following material helps to explain this relationship and discusses what you can do to help keep your heart healthy. Not all cholesterol is bad. Low-density lipoprotein (LDL) cholesterol is the "bad" cholesterol. It may cause fatty deposits to build up inside your arteries. High-density lipoprotein (HDL) cholesterol is "good." It helps to remove the "bad" LDL cholesterol from your blood. Cholesterol is a very important risk factor for heart disease. Other risk factors are high blood pressure, smoking, stress, heredity, and weight. The heart muscle gets its supply of blood through the coronary arteries. If your LDL cholesterol is high and your HDL cholesterol is low, you are at risk for having fatty deposits build up in your coronary arteries. This leaves less room through which blood can flow. Without sufficient blood and oxygen, the heart muscle cannot function properly and you may feel chest pains (angina pectoris). When a coronary artery closes up entirely, a part of the heart muscle may die, causing a heart attack (myocardial infarction). CHECKING CHOLESTEROL When your caregiver sends your blood to a lab to be analyzed for cholesterol, a complete lipid (fat) profile may be done. With this test, the total amount of cholesterol and levels of LDL and HDL are determined. Triglycerides are a type of fat that circulates in the blood and can also be used to determine heart disease risk. The list below describes what the numbers should be: Test: Total Cholesterol.  Less than 200 mg/dl.  Test: LDL "bad cholesterol."  Less than 100 mg/dl.   Less than 70 mg/dl if you are at very high risk of a heart attack or sudden cardiac death.  Test: HDL "good cholesterol."  Greater than 50 mg/dl for women.    Greater than 40 mg/dl for men.  Test: Triglycerides.  Less than 150 mg/dl.  CONTROLLING CHOLESTEROL WITH DIET Although exercise and lifestyle factors are important, your diet is key. That is because certain foods are known to raise cholesterol and others to lower it. The goal is to balance foods for their effect on cholesterol and more importantly, to replace saturated and trans fat with other types of fat, such as monounsaturated fat, polyunsaturated fat, and omega-3 fatty acids. On average, a person should consume no more than 15 to 17 g of saturated fat daily. Saturated and trans fats are considered "bad" fats, and they will raise LDL cholesterol. Saturated fats are primarily found in animal products such as meats, butter, and cream. However, that does not mean you need to sacrifice all your favorite foods. Today, there are good tasting, low-fat, low-cholesterol substitutes for most of the things you like to eat. Choose low-fat or nonfat alternatives. Choose round or loin cuts of red meat, since these types of cuts are lowest in fat and cholesterol. Chicken (without the skin), fish, veal, and ground [place] breast are excellent choices. Eliminate fatty meats, such as hot dogs and salami. Even shellfish have little or no saturated fat. Have a 3 oz (85 g) portion when you eat lean meat, poultry, or fish. Trans fats are also called "partially hydrogenated oils." They are oils that have been scientifically manipulated so that they are solid at room temperature resulting in a longer shelf life and improved taste and texture of foods in which they are added. Trans fats are found in stick margarine, some tub margarines, cookies, crackers, and baked goods.  When baking and cooking, oils are an excellent substitute for butter. The monounsaturated oils are especially beneficial since it is believed they lower LDL and raise HDL. The oils you should avoid entirely are saturated tropical oils, such as coconut and  palm.  Remember to eat liberally from food groups that are naturally free of saturated and trans fat, including fish, fruit, vegetables, beans, grains (barley, rice, couscous, bulgur wheat), and pasta (without cream sauces).  IDENTIFYING FOODS THAT LOWER CHOLESTEROL  Soluble fiber may lower your cholesterol. This type of fiber is found in fruits such as apples, vegetables such as broccoli, potatoes, and carrots, legumes such as beans, peas, and lentils, and grains such as barley. Foods fortified with plant sterols (phytosterol) may also lower cholesterol. You should eat at least 2 g per day of these foods for a cholesterol lowering effect.  Read package labels to identify low-saturated fats, trans fats free, and low-fat foods at the supermarket. Select cheeses that have only 2 to 3 g saturated fat per ounce. Use a heart-healthy tub margarine that is free of trans fats or partially hydrogenated oil. When buying baked goods (cookies, crackers), avoid partially hydrogenated oils. Breads and muffins should be made from whole grains (whole-wheat or whole oat flour, instead of "flour" or "enriched flour"). Buy non-creamy canned soups with reduced salt and no added fats.  FOOD PREPARATION TECHNIQUES  Never deep-fry. If you must fry, either stir-fry, which uses very little fat, or use non-stick cooking sprays. When possible, broil, bake, or roast meats, and steam vegetables. Instead of dressing vegetables with butter or margarine, use lemon and herbs, applesauce and cinnamon (for squash and sweet potatoes), nonfat yogurt, salsa, and low-fat dressings for salads.  LOW-SATURATED FAT / LOW-FAT FOOD SUBSTITUTES Meats / Saturated Fat (g)  Avoid: Steak, marbled (3 oz/85 g) / 11 g   Choose: Steak, lean (3 oz/85 g) / 4 g   Avoid: Hamburger (3 oz/85 g) / 7 g   Choose: Hamburger, lean (3 oz/85 g) / 5 g   Avoid: Ham (3 oz/85 g) / 6 g   Choose: Ham, lean cut (3 oz/85 g) / 2.4 g   Avoid: Chicken, with skin, dark  meat (3 oz/85 g) / 4 g   Choose: Chicken, skin removed, dark meat (3 oz/85 g) / 2 g   Avoid: Chicken, with skin, light meat (3 oz/85 g) / 2.5 g   Choose: Chicken, skin removed, light meat (3 oz/85 g) / 1 g  Dairy / Saturated Fat (g)  Avoid: Whole milk (1 cup) / 5 g   Choose: Low-fat milk, 2% (1 cup) / 3 g   Choose: Low-fat milk, 1% (1 cup) / 1.5 g   Choose: Skim milk (1 cup) / 0.3 g   Avoid: Hard cheese (1 oz/28 g) / 6 g   Choose: Skim milk cheese (1 oz/28 g) / 2 to 3 g   Avoid: Cottage cheese, 4% fat (1 cup) / 6.5 g   Choose: Low-fat cottage cheese, 1% fat (1 cup) / 1.5 g   Avoid: Ice cream (1 cup) / 9 g   Choose: Sherbet (1 cup) / 2.5 g   Choose: Nonfat frozen yogurt (1 cup) / 0.3 g   Choose: Frozen fruit bar / trace   Avoid: Whipped cream (1 tbs) / 3.5 g   Choose: Nondairy whipped topping (1 tbs) / 1 g  Condiments / Saturated Fat (g)  Avoid: Mayonnaise (1 tbs) / 2 g   Choose: Low-fat  mayonnaise (1 tbs) / 1 g   Avoid: Butter (1 tbs) / 7 g   Choose: Extra light margarine (1 tbs) / 1 g   Avoid: Coconut oil (1 tbs) / 11.8 g   Choose: Olive oil (1 tbs) / 1.8 g   Choose: Corn oil (1 tbs) / 1.7 g   Choose: Safflower oil (1 tbs) / 1.2 g   Choose: Sunflower oil (1 tbs) / 1.4 g   Choose: Soybean oil (1 tbs) / 2.4 g   Choose: Canola oil (1 tbs) / 1 g  Document Released: 06/26/2005 Document Revised: 03/08/2011 Document Reviewed: 12/15/2010 [facility] Patient Information 2012 [place], [place].  Exercise to Lose Weight Exercise and a healthy diet may help you lose weight. Your doctor may suggest specific exercises. EXERCISE IDEAS AND TIPS  Choose low-cost things you enjoy doing, such as walking, bicycling, or exercising to workout videos.   Take stairs instead of the elevator.   Walk during your lunch break.   Park your car further away from work or school.   Go to a gym or an exercise class.   Start with 5 to 10 minutes of exercise each day. Build up to  30 minutes of exercise 4 to 6 days a week.   Wear shoes with good support and comfortable clothes.   Stretch before and after working out.   Work out until you breathe harder and your heart beats faster.   Drink extra water when you exercise.   Do not do so much that you hurt yourself, feel dizzy, or get very short of breath.  Exercises that burn about 150 calories:  Running 1  miles in 15 minutes.   Playing volleyball for 45 to 60 minutes.   Washing and waxing a car for 45 to 60 minutes.   Playing touch football for 45 minutes.   Walking 1  miles in 35 minutes.   Pushing a stroller 1  miles in 30 minutes.   Playing basketball for 30 minutes.   Raking leaves for 30 minutes.   Bicycling 5 miles in 30 minutes.   Walking 2 miles in 30 minutes.   Dancing for 30 minutes.   Shoveling snow for 15 minutes.   Swimming laps for 20 minutes.   Walking up stairs for 15 minutes.   Bicycling 4 miles in 15 minutes.   Gardening for 30 to 45 minutes.   Jumping rope for 15 minutes.   Washing windows or floors for 45 to 60 minutes.  Document Released: 07/29/2010 Document Revised: 03/08/2011 Document Reviewed: 07/29/2010 [employer] Patient Information 2012 Exit  Diphtheria, Tetanus, and Pertussis (DTaP) Vaccine What You Need to Know WHY GET VACCINATED? Diphtheria, tetanus, and pertussis are serious diseases caused by bacteria. Diphtheria and pertussis are spread from person to person. Tetanus enters the body through cuts or wounds. Diphtheria causes a thick covering in the back of the throat.  It can lead to breathing problems, paralysis, heart failure, and even death.  Tetanus (Lockjaw) causes painful tightening of the muscles, usually all over the body.  It can lead to "locking" of the jaw so the victim cannot open his or her mouth or swallow. Tetanus leads to death in about 2 out of 10 cases.  Pertussis (Whooping Cough) causes coughing spells so bad that it is hard for  infants to eat, drink, or breathe. These spells can last for weeks.  It can lead to pneumonia, seizures (jerking and staring spells), brain damage, and death.  Diphtheria, tetanus,  and pertussis vaccine (DTaP) can help prevent these diseases. Most children who are vaccinated with DTaP will be protected throughout childhood. Many more children would get these diseases if we stopped vaccinating. DTaP is a safer version of an older vaccine called DTP. DTP is no longer used in the [place]. WHO SHOULD GET DTAP VACCINE AND WHEN? Children should get 5 doses of DTaP vaccine, 1 dose at each of the following ages:  2 months.   4 months.   6 months.   15 to 18 months.   4 to 6 years.  DTaP may be given at the same time as other vaccines. SOME CHILDREN SHOULD NOT GET DTAP VACCINE OR SHOULD WAIT  Children with minor illnesses, such as a cold, may be vaccinated. But children who are moderately or severely ill should usually wait until they recover before getting DTaP vaccine.   Any child who had a life-threatening allergic reaction after a dose of DTaP should not get another dose.   Any child who suffered a brain or nervous system disease within 7 days after a dose of DTaP should not get another dose.   Talk with your caregiver if your child:   Had a seizure or collapsed after a dose of DTaP.   Cried non-stop for 3 hours or more after a dose of DTaP.   Had a fever over 105 F (40.6 C) after a dose of DTaP.   Ask your caregiver for more information. Some of these children should not get another dose of pertussis vaccine, but may get a vaccine without pertussis, called DT.  OLDER CHILDREN AND ADULTS  DTaP is not licensed for adolescents, adults, or children 47 years of age and older.   But older people still need protection. A vaccine called Tdap is similar to DTaP. A single dose of Tdap is recommended for people 11 through 73 years of age. Another vaccine, called Td, protects against  tetanus and diphtheria, but not pertussis. It is recommended every 10 years.  WHAT ARE THE RISKS FROM DTAP VACCINE?  Getting diphtheria, tetanus, or pertussis disease is much riskier than getting DTaP vaccine.   However, a vaccine, like any medicine, is capable of causing serious problems, such as severe allergic reactions. The risk of DTaP vaccine causing serious harm, or death, is extremely small.  Mild Problems (Common)  Fever (up to about 1 child in 4).   Redness or swelling where the shot was given (up to about 1 child in 4).   Soreness or tenderness where the shot was given (up to about 1 child in 4).  These problems occur more often after the 4th and 5th doses of the DTaP series than after earlier doses. Sometimes the 4th or 5th dose of DTaP vaccine is followed by swelling of the entire arm or leg in which the shot was given, lasting 1 to 7 days (up to about 1 child in 30). Other mild problems include:  Fussiness (up to about 1 child in 3).   Tiredness or poor appetite (up to about 1 child in 10).   Vomiting (up to about 1 child in 50).  These problems generally occur 1 to 3 days after the shot. Moderate Problems (Uncommon)  Seizure (jerking or staring) (about 1 child out of 14,000).   Non-stop crying, for 3 hours or more (up to about 1 child out of 1,000).   High fever, over 105 F (40.6 C) (about 1 child out of 16,000).  Severe Problems (  Very Rare)  Serious allergic reaction (less than 1 out of a million doses).   Several other severe problems have been reported after DTaP vaccine. These include:   Long-term seizures, coma, or lowered consciousness.   Permanent brain damage.  These are so rare it is hard to tell if they are caused by the vaccine. Controlling fever is especially important for children who have had seizures, for any reason. It is also important if another family member has had seizures. You can reduce fever and pain by giving your child an aspirin-free  pain reliever when the shot is given, and for the next 24 hours, following the package instructions. WHAT IF THERE IS A MODERATE OR SEVERE REACTION? What should I look for? Any unusual conditions, such as a serious allergic reaction, high fever, or unusual behavior. Serious allergic reactions are extremely rare with any vaccine. If one were to occur, it would most likely be within a few minutes to a few hours after the shot. Signs can include difficulty breathing, hoarseness or wheezing, hives, paleness, weakness, a fast heartbeat, or dizziness. If a high fever or seizure were to occur, it would usually be within a week after the shot. What should I do?  Call your caregiver or get the person to a caregiver right away.   Tell the caregiver what happened, the date and time it happened, and when the vaccination was given.   Ask the caregiver, nurse, or health department to file a Vaccine Adverse Event Reporting System (VAERS) form. Or, you can file this report through the VAERS website at www.vaers.LAgents.no or by calling 1-7340873475.  VAERS does not provide medical advice. THE NATIONAL VACCINE INJURY COMPENSATION PROGRAM  In the rare event that you or your child has a serious reaction to a vaccine, a federal program has been created to help you pay for the care of those who have been harmed.   For details about the National Vaccine Injury Compensation Program, call 9123483976 or visit the program's website at SpiritualWord.at  HOW CAN I LEARN MORE?  Ask your caregiver. They can give you the vaccine package insert or suggest other sources of information.   Call your local or state health department's immunization program.   Contact the Centers for Disease Control and Prevention (CDC):   Call (534)568-9198 (1-800-CDC-INFO).   Visit the [employer] at PicCapture.uy  CDC Diphtheria, Tetanus, and Pertussis (DTaP) Vaccine VIS  (11/23/05) Document Released: 04/23/2006 Document Revised: 06/15/2011 Document Reviewed: 04/23/2006 [facility] Patient Information 2012 [place], [place].Marland Kitchen   Shingles Vaccine What You Need to Know WHAT IS SHINGLES?  Shingles is a painful skin rash, often with blisters. It is also called Herpes Zoster or just Zoster.   A shingles rash usually appears on one side of the face or body and lasts from 2 to 4 weeks. Its main symptom is pain, which can be quite severe. Other symptoms of shingles can include fever, headache, chills, and upset stomach. Very rarely, a shingles infection can lead to pneumonia, hearing problems, blindness, brain inflammation (encephalitis), or death.   For about 1 person in 5, severe pain can continue even after the rash clears up. This is called post-herpetic neuralgia.   Shingles is caused by the Varicella Zoster virus. This is the same virus that causes chickenpox. Only someone who has had a case of chickenpox or rarely, has gotten chickenpox vaccine, can get shingles. The virus stays in your body. It can reappear many years later to cause a case  of shingles.   You cannot catch shingles from another person with shingles. However, a person who has never had chickenpox (or chickenpox vaccine) could get chickenpox from someone with shingles. This is not very common.   Shingles is far more common in people 14 and older than in younger people. It is also more common in people whose immune systems are weakened because of a disease such as cancer or drugs such as steroids or chemotherapy.   At least 1 million people get shingles per year in the [place].  SHINGLES VACCINE  A vaccine for shingles was licensed in 2006. In clinical trials, the vaccine reduced the risk of shingles by 50%. It can also reduce the pain in people who still get shingles after being vaccinated.   A single dose of shingles vaccine is recommended for adults 5 years of age and older.  SOME PEOPLE  SHOULD NOT GET SHINGLES VACCINE OR SHOULD WAIT A person should not get shingles vaccine if he or she:  Has ever had a life-threatening allergic reaction to gelatin, the antibiotic neomycin, or any other component of shingles vaccine. Tell your caregiver if you have any severe allergies.   Has a weakened immune system because of current:   AIDS or another disease that affects the immune system.   Treatment with drugs that affect the immune system, such as prolonged use of high-dose steroids.   Cancer treatment, such as radiation or chemotherapy.   Cancer affecting the bone marrow or lymphatic system, such as leukemia or lymphoma.   Is pregnant, or might be pregnant. Women should not become pregnant until at least 4 weeks after getting shingles vaccine.  Someone with a minor illness, such as a cold, may be vaccinated. Anyone with a moderate or severe acute illness should usually wait until he or she recovers before getting the vaccine. This includes anyone with a temperature of 101.3 F (38 C) or higher. WHAT ARE THE RISKS FROM SHINGLES VACCINE?  A vaccine, like any medicine, could possibly cause serious problems, such as severe allergic reactions. However, the risk of a vaccine causing serious harm, or death, is extremely small.   No serious problems have been identified with shingles vaccine.  Mild Problems  Redness, soreness, swelling, or itching at the site of the injection (about 1 person in 3).   Headache (about 1 person in 70).  Like all vaccines, shingles vaccine is being closely monitored for unusual or severe problems. WHAT IF THERE IS A MODERATE OR SEVERE REACTION? What should I look for? Any unusual condition, such as a severe allergic reaction or a high fever. If a severe allergic reaction occurred, it would be within a few minutes to an hour after the shot. Signs of a serious allergic reaction can include difficulty breathing, weakness, hoarseness or wheezing, a fast  heartbeat, hives, dizziness, paleness, or swelling of the throat. What should I do?  Call your caregiver, or get the person to a caregiver right away.   Tell the caregiver what happened, the date and time it happened, and when the vaccination was given.   Ask the caregiver to report the reaction by filing a Vaccine Adverse Event Reporting System (VAERS) form. Or, you can file this report through the VAERS web site at www.vaers.LAgents.no or by calling 1-223-232-2249.  VAERS does not provide medical advice. HOW CAN I LEARN MORE?  Ask your caregiver. He or she can give you the vaccine package insert or suggest other sources of information.  Contact the Centers for Disease Control and Prevention (CDC):   Call 226-250-6237 (1-800-CDC-INFO).   Visit the CDC website at PicCapture.uy  CDC Shingles Vaccine VIS (04/14/08) Document Released: 04/23/2006 Document Revised: 06/15/2011 Document Reviewed: 04/14/2008 [facility] Patient Information 2012 [place], [place].

## 2012-03-19 ENCOUNTER — Encounter: Payer: Self-pay | Admitting: Gynecology

## 2012-03-25 ENCOUNTER — Ambulatory Visit (INDEPENDENT_AMBULATORY_CARE_PROVIDER_SITE_OTHER): Payer: Medicare Other | Admitting: Gynecology

## 2012-03-25 ENCOUNTER — Encounter: Payer: Self-pay | Admitting: Gynecology

## 2012-03-25 ENCOUNTER — Ambulatory Visit (INDEPENDENT_AMBULATORY_CARE_PROVIDER_SITE_OTHER): Payer: Medicare Other

## 2012-03-25 DIAGNOSIS — N8300 Follicular cyst of ovary, unspecified side: Secondary | ICD-10-CM

## 2012-03-25 DIAGNOSIS — N83339 Acquired atrophy of ovary and fallopian tube, unspecified side: Secondary | ICD-10-CM

## 2012-03-25 DIAGNOSIS — R1900 Intra-abdominal and pelvic swelling, mass and lump, unspecified site: Secondary | ICD-10-CM

## 2012-03-25 DIAGNOSIS — R635 Abnormal weight gain: Secondary | ICD-10-CM

## 2012-03-25 DIAGNOSIS — R198 Other specified symptoms and signs involving the digestive system and abdomen: Secondary | ICD-10-CM

## 2012-03-25 DIAGNOSIS — N83209 Unspecified ovarian cyst, unspecified side: Secondary | ICD-10-CM | POA: Insufficient documentation

## 2012-03-25 NOTE — Progress Notes (Signed)
Patient is a 71 year old was seen in the office on September 9 for her annual gynecological examination see into data information for that visit. Back in 2009 she was followed for a small left ovarian cyst which eventually resolved and she had a normal CA 125. Patient presented to the office today for followup ultrasound. Patient is otherwise been asymptomatic.  Ultrasound report today: Absent uterus. Right ovary normal. Left ovary continued presence of a 7 x 9 mm cyst with internal low level echo, negative color flow, negative cul-de-sac fluid. The cyst has not changed in size since 2009.  I discussed with Dana Sandoval that this appears to be very small insignificant cyst that has been present for 4 years. Prior C 125 was normal and we'll repeat one  today. The benefits to now outweigh the risk for laparoscopic bilateral subbing oophorectomy on this 71 year old patient. If her CA 125 is normal we'll continue to follow with annual ultrasounds and CA 125. If she develops any pain or symptoms of bloating she'll report to the office immediately. Literature information was provided on ovarian cyst. All questions are answered we'll follow accordingly.

## 2012-03-25 NOTE — Patient Instructions (Addendum)
Ovarian Cyst The ovaries are small organs that are on each side of the uterus. The ovaries are the organs that produce the female hormones, estrogen and progesterone. An ovarian cyst is a sac filled with fluid that can vary in its size. It is normal for a small cyst to form in women who are in the childbearing age and who have menstrual periods. This type of cyst is called a follicle cyst that becomes an ovulation cyst (corpus luteum cyst) after it produces the women's egg. It later goes away on its own if the woman does not become pregnant. There are other kinds of ovarian cysts that may cause problems and may need to be treated. The most serious problem is a cyst with cancer. It should be noted that menopausal women who have an ovarian cyst are at a higher risk of it being a cancer cyst. They should be evaluated very quickly, thoroughly and followed closely. This is especially true in menopausal women because of the high rate of ovarian cancer in women in menopause. CAUSES AND TYPES OF OVARIAN CYSTS:  FUNCTIONAL CYST: The follicle/corpus luteum cyst is a functional cyst that occurs every month during ovulation with the menstrual cycle. They go away with the next menstrual cycle if the woman does not get pregnant. Usually, there are no symptoms with a functional cyst.   ENDOMETRIOMA CYST: This cyst develops from the lining of the uterus tissue. This cyst gets in or on the ovary. It grows every month from the bleeding during the menstrual period. It is also called a "chocolate cyst" because it becomes filled with blood that turns brown. This cyst can cause pain in the lower abdomen during intercourse and with your menstrual period.   CYSTADENOMA CYST: This cyst develops from the cells on the outside of the ovary. They usually are not cancerous. They can get very big and cause lower abdomen pain and pain with intercourse. This type of cyst can twist on itself, cut off its blood supply and cause severe pain.  It also can easily rupture and cause a lot of pain.   DERMOID CYST: This type of cyst is sometimes found in both ovaries. They are found to have different kinds of body tissue in the cyst. The tissue includes skin, teeth, hair, and/or cartilage. They usually do not have symptoms unless they get very big. Dermoid cysts are rarely cancerous.   POLYCYSTIC OVARY: This is a rare condition with hormone problems that produces many small cysts on both ovaries. The cysts are follicle-like cysts that never produce an egg and become a corpus luteum. It can cause an increase in body weight, infertility, acne, increase in body and facial hair and lack of menstrual periods or rare menstrual periods. Many women with this problem develop type 2 diabetes. The exact cause of this problem is unknown. A polycystic ovary is rarely cancerous.   THECA LUTEIN CYST: Occurs when too much hormone (human chorionic gonadotropin) is produced and over-stimulates the ovaries to produce an egg. They are frequently seen when doctors stimulate the ovaries for invitro-fertilization (test tube babies).   LUTEOMA CYST: This cyst is seen during pregnancy. Rarely it can cause an obstruction to the birth canal during labor and delivery. They usually go away after delivery.  SYMPTOMS   Pelvic pain or pressure.   Pain during sexual intercourse.   Increasing girth (swelling) of the abdomen.   Abnormal menstrual periods.   Increasing pain with menstrual periods.   You stop having   menstrual periods and you are not pregnant.  DIAGNOSIS  The diagnosis can be made during:  Routine or annual pelvic examination (common).   Ultrasound.   X-ray of the pelvis.   CT Scan.   MRI.   Blood tests.  TREATMENT   Treatment may only be to follow the cyst monthly for 2 to 3 months with your caregiver. Many go away on their own, especially functional cysts.   May be aspirated (drained) with a long needle with ultrasound, or by laparoscopy  (inserting a tube into the pelvis through a small incision).   The whole cyst can be removed by laparoscopy.   Sometimes the cyst may need to be removed through an incision in the lower abdomen.   Hormone treatment is sometimes used to help dissolve certain cysts.   Birth control pills are sometimes used to help dissolve certain cysts.  HOME CARE INSTRUCTIONS  Follow your caregiver's advice regarding:  Medicine.   Follow up visits to evaluate and treat the cyst.   You may need to come back or make an appointment with another caregiver, to find the exact cause of your cyst, if your caregiver is not a gynecologist.   Get your yearly and recommended pelvic examinations and Pap tests.   Let your caregiver know if you have had an ovarian cyst in the past.  SEEK MEDICAL CARE IF:   Your periods are late, irregular, they stop, or are painful.   Your stomach (abdomen) or pelvic pain does not go away.   Your stomach becomes larger or swollen.   You have pressure on your bladder or trouble emptying your bladder completely.   You have painful sexual intercourse.   You have feelings of fullness, pressure, or discomfort in your stomach.   You lose weight for no apparent reason.   You feel generally ill.   You become constipated.   You lose your appetite.   You develop acne.   You have an increase in body and facial hair.   You are gaining weight, without changing your exercise and eating habits.   You think you are pregnant.  SEEK IMMEDIATE MEDICAL CARE IF:   You have increasing abdominal pain.   You feel sick to your stomach (nausea) and/or vomit.   You develop a fever that comes on suddenly.   You develop abdominal pain during a bowel movement.   Your menstrual periods become heavier than usual.  Document Released: 06/26/2005 Document Revised: 06/15/2011 Document Reviewed: 04/29/2009 [employer] Patient Information 2012 [employer]. 

## 2012-03-26 ENCOUNTER — Telehealth: Payer: Self-pay | Admitting: Gynecology

## 2012-03-26 ENCOUNTER — Encounter: Payer: Self-pay | Admitting: Gynecology

## 2012-03-26 LAB — CA 125: CA 125: 5.7 U/mL (ref 0.0–30.2)

## 2012-03-26 NOTE — Telephone Encounter (Signed)
Patient informed CA-125 test was normal.

## 2012-03-27 ENCOUNTER — Other Ambulatory Visit: Payer: Self-pay | Admitting: Anesthesiology

## 2012-03-27 DIAGNOSIS — Z1211 Encounter for screening for malignant neoplasm of colon: Secondary | ICD-10-CM

## 2012-04-01 ENCOUNTER — Other Ambulatory Visit: Payer: Self-pay | Admitting: Gynecology

## 2012-04-01 DIAGNOSIS — Z1211 Encounter for screening for malignant neoplasm of colon: Secondary | ICD-10-CM

## 2012-04-03 ENCOUNTER — Encounter: Payer: Self-pay | Admitting: Cardiology

## 2012-04-03 ENCOUNTER — Ambulatory Visit (INDEPENDENT_AMBULATORY_CARE_PROVIDER_SITE_OTHER): Payer: Medicare Other | Admitting: Cardiology

## 2012-04-03 VITALS — BP 146/56 | HR 73 | Ht 62.0 in | Wt 205.1 lb

## 2012-04-03 DIAGNOSIS — I359 Nonrheumatic aortic valve disorder, unspecified: Secondary | ICD-10-CM

## 2012-04-03 DIAGNOSIS — I350 Nonrheumatic aortic (valve) stenosis: Secondary | ICD-10-CM

## 2012-04-03 NOTE — Assessment & Plan Note (Signed)
The patient has not been having any symptoms from her aortic stenosis.  She denies any chest pain or shortness of breath.  She's had no dizziness or syncope

## 2012-04-03 NOTE — Progress Notes (Signed)
Dana Sandoval Date of Birth:  14-Oct-1940 [facility] [address] [place], [place]  [address] [phone]  Fax   [phone]  HPI: This pleasant 71 year old Caucasian female is seen for a scheduled one year followup office visit. She has a history of aortic stenosis. She does not have any history of ischemic heart disease. She had a normal LexiScan Cardiolite stress test on 09/13/09 which showed no ischemia and showed an ejection fraction of 83% and no regional wall motion abnormalities. Her last echocardiogram was done on 03/16/11 and showed an ejection fraction of 55-60% with moderate aortic stenosis.   Current Outpatient Prescriptions  Medication Sig Dispense Refill  . aspirin 81 MG tablet Take 81 mg by mouth daily. Every other night      . calcium gluconate 500 MG tablet Take 500 mg by mouth every morning.        . Cholecalciferol (VITAMIN D PO) Take by mouth every morning.        . ezetimibe-simvastatin (VYTORIN) 10-40 MG per tablet Take 1 tablet by mouth daily.        . Latanoprost (XALATAN OP) Apply to eye every morning.        Marland Kitchen levothyroxine (SYNTHROID, LEVOTHROID) 100 MCG tablet Take 112 mcg by mouth daily.       Marland Kitchen NITROGLYCERIN PO Take by mouth as needed.        . Omega-3 Fatty Acids (FISH OIL PO) Take by mouth daily.        . pioglitazone-metformin (ACTOPLUS MET) 15-500 MG per tablet Take 1 tablet by mouth 2 (two) times daily with a meal.        . ramipril (ALTACE) 10 MG capsule Take 10 mg by mouth daily.          Allergies  Allergen Reactions  . Codeine Itching  . Dilaudid (Hydromorphone Hcl) Itching    Patient Active Problem List  Diagnosis  . Osteopenia of the elderly  . Aortic stenosis, moderate  . Dyslipidemia  . Diabetes mellitus  . Weight gain  . Family history of osteoporosis  . History of ovarian cyst  . Ovarian cyst    History  Smoking status  . Never Smoker   Smokeless tobacco  . Never Used    History  Alcohol Use  No    Family History  Problem Relation Age of Onset  . Osteoporosis Mother   . Heart disease Mother   . Heart disease Father   . Cancer Father     BRAIN TUMOR  . Diabetes Maternal Aunt   . Diabetes Maternal Uncle   . Cancer Brother     BRAIN TUMOR    Review of Systems: The patient denies any heat or cold intolerance.  No weight gain or weight loss.  The patient denies headaches or blurry vision.  There is no cough or sputum production.  The patient denies dizziness.  There is no hematuria or hematochezia.  The patient denies any muscle aches or arthritis.  The patient denies any rash.  The patient denies frequent falling or instability.  There is no history of depression or anxiety.  All other systems were reviewed and are negative.   Physical Exam: Filed Vitals:   04/03/12 1001  BP: 146/56  Pulse: 73   the general appearance reveals a well-developed well-nourished woman in no distress.The head and neck exam reveals pupils equal and reactive.  Extraocular movements are full.  There is no scleral icterus.  The mouth and  pharynx are normal.  The neck is supple.  The carotids reveal no bruits.  The jugular venous pressure is normal.  The  thyroid is not enlarged.  There is no lymphadenopathy.  The chest is clear to percussion and auscultation.  There are no rales or rhonchi.  Expansion of the chest is symmetrical.  The precordium is quiet.  The first heart sound is normal.  The second heart sound is physiologically split.  There is a grade 2/6 systolic ejection murmur of aortic stenosis loudest at the base.  There is no abnormal lift or heave.  The abdomen is soft and nontender.  The bowel sounds are normal.  The liver and spleen are not enlarged.  There are no abdominal masses.  There are no abdominal bruits.  Extremities reveal good pedal pulses.  There is no phlebitis or edema.  There is no cyanosis or clubbing.  Strength is normal and symmetrical in all extremities.  There is no  lateralizing weakness.  There are no sensory deficits.  The skin is warm and dry.  There is no rash.   EKG today shows normal sinus rhythm and no ischemic changes and is within normal limits  Assessment / Plan: The patient is to continue on present medication.  She will return in one year for followup office visit and she will get a two-dimensional echo before her visit

## 2012-04-03 NOTE — Patient Instructions (Addendum)
Your physician recommends that you continue on your current medications as directed. Please refer to the Current Medication list given to you today.  Your physician wants you to follow-up in: 1 year. You will receive a reminder letter in the mail two months in advance. If you don't receive a letter, please call our office to schedule the follow-up appointment.  

## 2012-04-09 ENCOUNTER — Ambulatory Visit: Payer: Medicare Other | Admitting: Cardiology

## 2012-09-26 ENCOUNTER — Encounter: Payer: Self-pay | Admitting: Cardiology

## 2013-03-19 ENCOUNTER — Encounter: Payer: Self-pay | Admitting: Gynecology

## 2013-04-03 ENCOUNTER — Ambulatory Visit (HOSPITAL_COMMUNITY): Payer: Medicare Other | Attending: Cardiology

## 2013-04-03 DIAGNOSIS — I350 Nonrheumatic aortic (valve) stenosis: Secondary | ICD-10-CM

## 2013-04-03 DIAGNOSIS — I359 Nonrheumatic aortic valve disorder, unspecified: Secondary | ICD-10-CM | POA: Insufficient documentation

## 2013-04-03 DIAGNOSIS — I079 Rheumatic tricuspid valve disease, unspecified: Secondary | ICD-10-CM | POA: Insufficient documentation

## 2013-04-03 DIAGNOSIS — I059 Rheumatic mitral valve disease, unspecified: Secondary | ICD-10-CM | POA: Insufficient documentation

## 2013-04-03 NOTE — Progress Notes (Signed)
Echocardiogram performed.  

## 2013-04-07 ENCOUNTER — Encounter: Payer: Self-pay | Admitting: Cardiology

## 2013-04-07 ENCOUNTER — Ambulatory Visit (INDEPENDENT_AMBULATORY_CARE_PROVIDER_SITE_OTHER): Payer: Medicare Other | Admitting: Cardiology

## 2013-04-07 VITALS — BP 122/60 | HR 71 | Ht 62.0 in | Wt 185.0 lb

## 2013-04-07 DIAGNOSIS — IMO0001 Reserved for inherently not codable concepts without codable children: Secondary | ICD-10-CM

## 2013-04-07 DIAGNOSIS — E7800 Pure hypercholesterolemia, unspecified: Secondary | ICD-10-CM

## 2013-04-07 DIAGNOSIS — I359 Nonrheumatic aortic valve disorder, unspecified: Secondary | ICD-10-CM

## 2013-04-07 DIAGNOSIS — I350 Nonrheumatic aortic (valve) stenosis: Secondary | ICD-10-CM

## 2013-04-07 DIAGNOSIS — E785 Hyperlipidemia, unspecified: Secondary | ICD-10-CM

## 2013-04-07 NOTE — Patient Instructions (Signed)
Your physician recommends that you continue on your current medications as directed. Please refer to the Current Medication list given to you today.  Your physician wants you to follow-up in: 1 year ov You will receive a reminder letter in the mail two months in advance. If you don't receive a letter, please call our office to schedule the follow-up appointment.  

## 2013-04-07 NOTE — Assessment & Plan Note (Signed)
The patient brought in copies of recent lab work done at Dr. Merri Brunette office.  Her lab work is excellent with total cholesterol 118 and LDL cholesterol 63.  She has lost 20 pounds since last year and this has also helped her lipid panel status.

## 2013-04-07 NOTE — Assessment & Plan Note (Signed)
The patient is not having any symptoms from her diabetes.  No hypoglycemic episodes

## 2013-04-07 NOTE — Addendum Note (Signed)
Addended by: Regis Bill B on: 04/07/2013 11:24 AM   Modules accepted: Orders

## 2013-04-07 NOTE — Progress Notes (Signed)
Dana Sandoval Date of Birth:  05/02/1941 [facility] [address] [place], [place]  [address] [phone]  Fax   [phone]  HPI: This pleasant 72 year old Caucasian female is seen for a scheduled one year followup office visit. She has a history of aortic stenosis. She does not have any history of ischemic heart disease. She had a normal LexiScan Cardiolite stress test on 09/13/09 which showed no ischemia and showed an ejection fraction of 83% and no regional wall motion abnormalities.  The patient has a history of diabetes.  She is not having any hypoglycemic episodes.  She has a history of hypercholesterolemia.  Since last visit one year ago she has intentionally lost 20 pounds.  Current Outpatient Prescriptions  Medication Sig Dispense Refill  . ACCU-CHEK AVIVA PLUS test strip       . aspirin 81 MG tablet Take 81 mg by mouth daily. Every other night      . atorvastatin (LIPITOR) 40 MG tablet       . calcium gluconate 500 MG tablet Take 500 mg by mouth every morning.        . Cholecalciferol (VITAMIN D PO) Take by mouth every morning.        Marland Kitchen levothyroxine (SYNTHROID, LEVOTHROID) 100 MCG tablet Take 112 mcg by mouth daily.       . metFORMIN (GLUCOPHAGE-XR) 500 MG 24 hr tablet       . NITROGLYCERIN PO Take by mouth as needed.        . Omega-3 Fatty Acids (FISH OIL PO) Take by mouth daily.        . ramipril (ALTACE) 10 MG capsule Take 10 mg by mouth daily.         No current facility-administered medications for this visit.    Allergies  Allergen Reactions  . Codeine Itching  . Dilaudid [Hydromorphone Hcl] Itching    Patient Active Problem List   Diagnosis Date Noted  . Ovarian cyst 03/25/2012  . Weight gain 03/18/2012  . Family history of osteoporosis 03/18/2012  . History of ovarian cyst 03/18/2012  . Aortic stenosis, moderate 03/20/2011  . Dyslipidemia 03/20/2011  . Diabetes mellitus 03/20/2011  . Osteopenia of the elderly 03/17/2011     History  Smoking status  . Never Smoker   Smokeless tobacco  . Never Used    History  Alcohol Use No    Family History  Problem Relation Age of Onset  . Osteoporosis Mother   . Heart disease Mother   . Heart disease Father   . Cancer Father     BRAIN TUMOR  . Diabetes Maternal Aunt   . Diabetes Maternal Uncle   . Cancer Brother     BRAIN TUMOR    Review of Systems: The patient denies any heat or cold intolerance.  No weight gain or weight loss.  The patient denies headaches or blurry vision.  There is no cough or sputum production.  The patient denies dizziness.  There is no hematuria or hematochezia.  The patient denies any muscle aches or arthritis.  The patient denies any rash.  The patient denies frequent falling or instability.  There is no history of depression or anxiety.  All other systems were reviewed and are negative.   Physical Exam: Filed Vitals:   04/07/13 1007  BP: 122/60  Pulse: 71   the general appearance reveals a well-developed well-nourished woman in no distress.The head and neck exam reveals pupils equal and reactive.  Extraocular movements  are full.  There is no scleral icterus.  The mouth and pharynx are normal.  The neck is supple.  The carotids reveal no bruits.  The jugular venous pressure is normal.  The  thyroid is not enlarged.  There is no lymphadenopathy.  The chest is clear to percussion and auscultation.  There are no rales or rhonchi.  Expansion of the chest is symmetrical.  The precordium is quiet.  The first heart sound is normal.  The second heart sound is physiologically split.  There is a grade 2/6 systolic ejection murmur of aortic stenosis loudest at the base.  There is no abnormal lift or heave.  The abdomen is soft and nontender.  The bowel sounds are normal.  The liver and spleen are not enlarged.  There are no abdominal masses.  There are no abdominal bruits.  Extremities reveal good pedal pulses.  There is no phlebitis or edema.   There is no cyanosis or clubbing.  Strength is normal and symmetrical in all extremities.  There is no lateralizing weakness.  There are no sensory deficits.  The skin is warm and dry.  There is no rash.   EKG today shows normal sinus rhythm and no ischemic changes and is within normal limits  Assessment / Plan: The patient is to continue on present medication.  She will return in one year for followup office visit and EKG.  Continue careful diet

## 2013-04-07 NOTE — Assessment & Plan Note (Signed)
The patient has moderate aortic stenosis.  She is not having any symptoms of heart failure, angina, or exertional dizziness or syncope.  We have been checking echo is every 2 years.  Her echocardiogram last week shows peak aortic gradient of 43.  In 2012 her peak gradient was 36.  Last week her mean gradient across the aortic valve was 21 anion 2012 it was 23.  There has been no significant worsening of her aortic stenosis over the past 2 years.

## 2014-04-08 ENCOUNTER — Encounter: Payer: Self-pay | Admitting: Cardiology

## 2014-04-08 ENCOUNTER — Ambulatory Visit (INDEPENDENT_AMBULATORY_CARE_PROVIDER_SITE_OTHER): Payer: Medicare Other | Admitting: Cardiology

## 2014-04-08 VITALS — BP 124/68 | HR 71 | Ht 62.0 in | Wt 193.8 lb

## 2014-04-08 DIAGNOSIS — IMO0001 Reserved for inherently not codable concepts without codable children: Secondary | ICD-10-CM

## 2014-04-08 DIAGNOSIS — E785 Hyperlipidemia, unspecified: Secondary | ICD-10-CM

## 2014-04-08 DIAGNOSIS — E7800 Pure hypercholesterolemia, unspecified: Secondary | ICD-10-CM

## 2014-04-08 DIAGNOSIS — E1165 Type 2 diabetes mellitus with hyperglycemia: Secondary | ICD-10-CM

## 2014-04-08 DIAGNOSIS — I350 Nonrheumatic aortic (valve) stenosis: Secondary | ICD-10-CM

## 2014-04-08 DIAGNOSIS — I359 Nonrheumatic aortic valve disorder, unspecified: Secondary | ICD-10-CM

## 2014-04-08 NOTE — Assessment & Plan Note (Signed)
The patient has not been experiencing any chest pain or shortness of breath.  No exertional dizziness or syncope.  No palpitations.

## 2014-04-08 NOTE — Assessment & Plan Note (Signed)
The patient is on statin therapy.  She's not having any myalgias.  Her lab work is followed by Dr. Merri Brunetteandace Smith.

## 2014-04-08 NOTE — Assessment & Plan Note (Signed)
The patient has not been having any hypoglycemic episodes 

## 2014-04-08 NOTE — Progress Notes (Signed)
Dana Sandoval Date of Birth:  1941-01-29 [facility] [address] [place], [place]  [address] [phone]        Fax   [phone]   History of Present Illness: This pleasant 73 year old Caucasian female is seen for a scheduled one year followup office visit.  She is a medical patient of Dr. Merri Brunette. She has a history of aortic moderate stenosis. She does not have any history of ischemic heart disease. She had a normal LexiScan Cardiolite stress test on 09/13/09 which showed no ischemia and showed an ejection fraction of 83% and no regional wall motion abnormalities. The patient has a history of diabetes. She is not having any hypoglycemic episodes. She has a history of hypercholesterolemia.  Her last echocardiogram on 04/03/13 showed normal left ventricular systolic function with ejection fraction 55-65% and grade 2 diastolic dysfunction.  She had moderate aortic stenosis with peak aortic gradient of 43 and a mean gradient of 21.  Current Outpatient Prescriptions  Medication Sig Dispense Refill  . aspirin 81 MG tablet Take 81 mg by mouth daily. Every other night      . atorvastatin (LIPITOR) 40 MG tablet Take 40 mg by mouth daily at 6 PM.       . calcium gluconate 500 MG tablet Take 500 mg by mouth every morning.        . Cholecalciferol (VITAMIN D PO) Take by mouth every morning.       Marland Kitchen levothyroxine (SYNTHROID, LEVOTHROID) 100 MCG tablet Take 112 mcg by mouth daily.       . metFORMIN (GLUCOPHAGE-XR) 500 MG 24 hr tablet       . NITROGLYCERIN PO Take by mouth as needed.        . Omega-3 Fatty Acids (FISH OIL PO) Take by mouth daily.        . ramipril (ALTACE) 10 MG capsule Take 10 mg by mouth daily.         No current facility-administered medications for this visit.    Allergies  Allergen Reactions  . Codeine Itching  . Dilaudid [Hydromorphone Hcl] Itching    Patient Active Problem List   Diagnosis Date Noted  . Ovarian cyst 03/25/2012  .  Weight gain 03/18/2012  . Family history of osteoporosis 03/18/2012  . History of ovarian cyst 03/18/2012  . Aortic stenosis, moderate 03/20/2011  . Dyslipidemia 03/20/2011  . Type II or unspecified type diabetes mellitus without mention of complication, uncontrolled 03/20/2011  . Osteopenia of the elderly 03/17/2011    History  Smoking status  . Never Smoker   Smokeless tobacco  . Never Used    History  Alcohol Use No    Family History  Problem Relation Age of Onset  . Osteoporosis Mother   . Heart disease Mother   . Heart disease Father   . Cancer Father     BRAIN TUMOR  . Diabetes Maternal Aunt   . Diabetes Maternal Uncle   . Cancer Brother     BRAIN TUMOR    Review of Systems: Constitutional: no fever chills diaphoresis or fatigue or change in weight.  Head and neck: no hearing loss, no epistaxis, no photophobia or visual disturbance. Respiratory: No cough, shortness of breath or wheezing. Cardiovascular: No chest pain peripheral edema, palpitations. Gastrointestinal: No abdominal distention, no abdominal pain, no change in bowel habits hematochezia or melena. Genitourinary: No dysuria, no frequency, no urgency, no nocturia. Musculoskeletal:No arthralgias, no back pain, no gait disturbance  or myalgias. Neurological: No dizziness, no headaches, no numbness, no seizures, no syncope, no weakness, no tremors. Hematologic: No lymphadenopathy, no easy bruising. Psychiatric: No confusion, no hallucinations, no sleep disturbance.    Physical Exam: Filed Vitals:   04/08/14 0730  BP: 124/68  Pulse: 71  The patient appears to be in no distress.  Head and neck exam reveals that the pupils are equal and reactive.  The extraocular movements are full.  There is no scleral icterus.  Mouth and pharynx are benign.  No lymphadenopathy.  No carotid bruits.  The jugular venous pressure is normal.  Thyroid is not enlarged or tender.  Chest is clear to percussion and  auscultation.  No rales or rhonchi.  Expansion of the chest is symmetrical.  Heart reveals no abnormal lift or heave.  First and second heart sounds are normal.  There is grade 2/6 systolic ejection murmur at the base.  The carotid upstroke is normal.  The abdomen is soft and nontender.  Bowel sounds are normoactive.  There is no hepatosplenomegaly or mass.  There are no abdominal bruits.  Extremities reveal no phlebitis or edema.  Pedal pulses are good.  There is no cyanosis or clubbing.  Neurologic exam is normal strength and no lateralizing weakness.  No sensory deficits.  Integument reveals no rash  EKG shows normal sinus rhythm with sinus arrhythmia and is unchanged since 04/07/13  Assessment / Plan: 1. moderate aortic stenosis. 2. Hypercholesterolemia 3. diabetes mellitus 4. exogenous obesity  Disposition: Continue on same medication.  Work harder on weight loss and diet.  Recheck in one year for office visit and EKG and she will get an echocardiogram about one week prior to her next office visit.

## 2014-04-08 NOTE — Patient Instructions (Signed)
Your physician recommends that you continue on your current medications as directed. Please refer to the Current Medication list given to you today.  Your physician wants you to follow-up in: 1 year ov/ekg  You will receive a reminder letter in the mail two months in advance. If you don't receive a letter, please call our office to schedule the follow-up appointment.   Your physician has requested that you have an echocardiogram. Echocardiography is a painless test that uses sound waves to create images of your heart. It provides your doctor with information about the size and shape of your heart and how well your heart's chambers and valves are working. This procedure takes approximately one hour. There are no restrictions for this procedure. IN 1 YEAR ABOUT A WEEK BEFORE YOUR OFFICE VISIT

## 2014-05-11 ENCOUNTER — Encounter: Payer: Self-pay | Admitting: Cardiology

## 2014-06-17 ENCOUNTER — Encounter: Payer: Self-pay | Admitting: Gynecology

## 2014-06-17 ENCOUNTER — Ambulatory Visit (INDEPENDENT_AMBULATORY_CARE_PROVIDER_SITE_OTHER): Payer: Medicare Other | Admitting: Gynecology

## 2014-06-17 VITALS — BP 126/74 | Ht 62.0 in | Wt 198.8 lb

## 2014-06-17 DIAGNOSIS — N83201 Unspecified ovarian cyst, right side: Secondary | ICD-10-CM

## 2014-06-17 DIAGNOSIS — N952 Postmenopausal atrophic vaginitis: Secondary | ICD-10-CM

## 2014-06-17 DIAGNOSIS — E663 Overweight: Secondary | ICD-10-CM

## 2014-06-17 DIAGNOSIS — M8580 Other specified disorders of bone density and structure, unspecified site: Secondary | ICD-10-CM

## 2014-06-17 DIAGNOSIS — N8320 Unspecified ovarian cysts: Secondary | ICD-10-CM

## 2014-06-17 NOTE — Patient Instructions (Signed)

## 2014-06-17 NOTE — Progress Notes (Signed)
Dana JarvisJannette B Sandoval [birth year] [id number]   History:    73 y.o.  for GYN exam and follow-up. Patient is asymptomatic. Patient with known history of osteopenia and vaginal atrophy and has had issues with weight gain for several years. She is under the care of Dr. Merri Brunetteandace Smith who has recently done her lab work since she has been monitoring patient's dyslipidemia and diabetes type 2. Dr. Simeon CraftBlackburn (cardiologist) has been following patient for her history of aortic stenosis see previous entry in epic chart system for details. Patient's last colonoscopy was in 2009 which was normal but several years prior she had benign polyps. Back in 2009 she was followed for a small left ovarian cyst which eventually resolved and she had a normal CA 125. Last bone density study September 2012 with the following:  Right hip T score of -1.0 Frax analysis ten-year fracture risk of hip 0.6% and risk of any major osteoporotic fracture 7.5%. A value supple threshold not requiring any treatment at this time. Patient had been on Boniva in the past for 2 years only. 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 The left femoral neck had 1.7% decrease in bone mineralization. Patient is due for her bone density study.  Patient with past history of total abdominal hysterectomy  for leiomyomatous uteri. Ovaries were not removed. Patient had an ultrasound due to the fact that because of her abdominal girth a complete pelvic pelvic exam was not possible. Report of the ultrasound as follows:  Ultrasound report today: Absent uterus. Right ovary normal. Left ovary continued presence of a 7 x 9 mm cyst with internal low level echo, negative color flow, negative cul-de-sac fluid. The cyst has not changed in size since 2009. Her CA-125 was 5.7  Patient states that she does her monthly self breast examination. Patient with  no prior history of abnormal Pap smear.  Past medical history,surgical history, family history and social history were all reviewed and documented in the EPIC chart.  Gynecologic History No LMP recorded. Patient has had a hysterectomy. Contraception: status post hysterectomy Last Pap: 2012. Results were: normal Last mammogram: 2015. Results were: normal  Obstetric History OB History  Gravida Para Term Preterm AB SAB TAB Ectopic Multiple Living  6 4 4  2 2    4     # Outcome Date GA Lbr Len/2nd Weight Sex Delivery Anes PTL Lv  6 SAB           5 SAB           4 Term     M Vag-Spont  N Y  3 Term     F Vag-Spont  N Y  2 Term     F Vag-Spont  N Y  1 Term     F Vag-Spont  N Y       ROS: A ROS was performed and pertinent positives and negatives are included in the history.  GENERAL: No fevers or chills. HEENT: No change in vision, no earache, sore throat or sinus congestion. NECK: No pain or stiffness. CARDIOVASCULAR: No chest pain or pressure. No palpitations. PULMONARY: No shortness of breath, cough or wheeze. GASTROINTESTINAL: No abdominal pain, nausea, vomiting or diarrhea, melena or bright red blood per rectum. GENITOURINARY: No urinary frequency, urgency, hesitancy or dysuria. MUSCULOSKELETAL: No joint or muscle pain, no back pain, no recent trauma. DERMATOLOGIC: No rash, no itching, no lesions.  ENDOCRINE: No polyuria, polydipsia, no heat or cold intolerance. No recent change in weight. HEMATOLOGICAL: No anemia or easy bruising or bleeding. NEUROLOGIC: No headache, seizures, numbness, tingling or weakness. PSYCHIATRIC: No depression, no loss of interest in normal activity or change in sleep pattern.     Exam: chaperone present  BP 126/74 mmHg  Ht 5\' 2"  (1.575 m)  Wt 198 lb 12.8 oz (90.175 kg)  BMI 36.35 kg/m2  Body mass index is 36.35 kg/(m^2).  General appearance : Well developed well nourished female. No acute distress HEENT: Neck supple, trachea midline, no carotid bruits,  no thyroidmegaly Lungs: Clear to auscultation, no rhonchi or wheezes, or rib retractions  Heart: Regular rate and rhythm, no murmurs or gallops Breast:Examined in sitting and supine position were symmetrical in appearance, no palpable masses or tenderness,  no skin retraction, no nipple inversion, no nipple discharge, no skin discoloration, no axillary or supraclavicular lymphadenopathy Abdomen: no palpable masses or tenderness, no rebound or guarding Extremities: no edema or skin discoloration or tenderness  Pelvic:  Bartholin, Urethra, Skene Glands: Within normal limits             Vagina: No gross lesions or discharge, vaginal atrophy  Cervix: Absent  Uterus  absent, normal size, shape and consistency, non-tender and mobile  Adnexano tenderness but due to her abdominal girth exam complaining she will return for ultrasound.us and perineum  normal   Rectovaginal  normal sphincter tone without palpated masses or tenderness             Hemoccult PCP provides     Assessment/Plan:  73 y.o. female   with normal GYN exam with the exception of mild vaginal atrophy for which patient is asymptomatic. Patient overdue for bone density study her last bone density study in 2012 demonstrated osteopenia but according to the Frax analysis she does not need to be put on any antiresorptive agent at the present time. She will continue with her calcium and vitamin D and active lifestyle. Pap smear not done today in accordance to the guidelines.Patient will return to the office for pelvic ultrasound to compare with previous study 2 years ago of this very small insignificant 7 x 9 mm right cyst with low-level echoes negative color flow.  Ok EdwardsFERNANDEZ,JUAN H MD, 10:00 AM 06/17/2014

## 2014-07-09 ENCOUNTER — Other Ambulatory Visit: Payer: Self-pay | Admitting: Gynecology

## 2014-07-09 ENCOUNTER — Encounter: Payer: Self-pay | Admitting: Gynecology

## 2014-07-09 ENCOUNTER — Ambulatory Visit (INDEPENDENT_AMBULATORY_CARE_PROVIDER_SITE_OTHER): Payer: Medicare Other

## 2014-07-09 ENCOUNTER — Ambulatory Visit (INDEPENDENT_AMBULATORY_CARE_PROVIDER_SITE_OTHER): Payer: Medicare Other | Admitting: Gynecology

## 2014-07-09 DIAGNOSIS — N83202 Unspecified ovarian cyst, left side: Secondary | ICD-10-CM

## 2014-07-09 DIAGNOSIS — N83201 Unspecified ovarian cyst, right side: Secondary | ICD-10-CM

## 2014-07-09 DIAGNOSIS — N8320 Unspecified ovarian cysts: Secondary | ICD-10-CM

## 2014-07-09 NOTE — Progress Notes (Signed)
   73 year old patient presented to the office to discuss her ultrasound. She was seen in the office on December 9 for her annual exam. Please see previous note for details as to her history.. Back in 2009 she was followed for a small left ovarian cyst which eventually resolved and she had a normal CA 125. Last year due to her abdominal girth and ultrasound had been done which demonstrated that her right ovary was normal left ovary had a continued presence of a 7 x 9 mm cyst with low-level internal echoes with negative color flow that been present since 2009. Her CA-125 was 5.7.  Ultrasound today absence of uterus from previous hysterectomy. Ovary slightly enlarged but appeared normal with the persistence of the left ovarian echo-free thinwall avascular cyst measuring 7 x 6 x 6 mm decreasing in size from previous scans reported. The right and left ovarian volume has fluctuated through the years and he could be attributed to the angle for the vaginal probe at time of ultrasound. A CA-125 will be done today if his in normal range we'll continue to follow on an annual basis at time of her annual exam. She was reassured and all questions are answered. Patient was otherwise asymptomatic.

## 2014-07-10 LAB — CA 125: CA 125: 8 U/mL (ref <35)

## 2014-10-08 ENCOUNTER — Ambulatory Visit
Admission: RE | Admit: 2014-10-08 | Discharge: 2014-10-08 | Disposition: A | Discharge status: Home / Self Care | Payer: Medicare Other | Source: Ambulatory Visit | Attending: Nurse Practitioner | Admitting: Nurse Practitioner

## 2014-10-08 ENCOUNTER — Other Ambulatory Visit: Payer: Self-pay | Admitting: Nurse Practitioner

## 2014-10-08 DIAGNOSIS — W19XXXA Unspecified fall, initial encounter: Secondary | ICD-10-CM

## 2015-03-19 ENCOUNTER — Encounter: Payer: Self-pay | Admitting: Women's Health

## 2015-03-25 ENCOUNTER — Encounter: Payer: Self-pay | Admitting: Gynecology

## 2015-04-02 ENCOUNTER — Telehealth (HOSPITAL_COMMUNITY): Payer: Self-pay | Admitting: *Deleted

## 2015-04-05 ENCOUNTER — Other Ambulatory Visit (HOSPITAL_COMMUNITY): Payer: Self-pay | Admitting: Nurse Practitioner

## 2015-04-05 ENCOUNTER — Encounter: Payer: Self-pay | Admitting: Gynecology

## 2015-04-05 DIAGNOSIS — R0989 Other specified symptoms and signs involving the circulatory and respiratory systems: Secondary | ICD-10-CM

## 2015-04-07 ENCOUNTER — Ambulatory Visit (HOSPITAL_COMMUNITY)
Admission: RE | Admit: 2015-04-07 | Discharge: 2015-04-07 | Disposition: A | Discharge status: Home / Self Care | Payer: Medicare Other | Source: Ambulatory Visit | Attending: Cardiovascular Disease | Admitting: Cardiovascular Disease

## 2015-04-07 ENCOUNTER — Other Ambulatory Visit: Payer: Self-pay

## 2015-04-07 ENCOUNTER — Other Ambulatory Visit: Payer: Self-pay | Admitting: Cardiology

## 2015-04-07 ENCOUNTER — Ambulatory Visit (HOSPITAL_BASED_OUTPATIENT_CLINIC_OR_DEPARTMENT_OTHER): Payer: Medicare Other

## 2015-04-07 DIAGNOSIS — I340 Nonrheumatic mitral (valve) insufficiency: Secondary | ICD-10-CM | POA: Diagnosis not present

## 2015-04-07 DIAGNOSIS — I350 Nonrheumatic aortic (valve) stenosis: Secondary | ICD-10-CM | POA: Diagnosis not present

## 2015-04-07 DIAGNOSIS — R0989 Other specified symptoms and signs involving the circulatory and respiratory systems: Secondary | ICD-10-CM | POA: Diagnosis not present

## 2015-04-07 DIAGNOSIS — Z6836 Body mass index (BMI) 36.0-36.9, adult: Secondary | ICD-10-CM | POA: Diagnosis not present

## 2015-04-07 DIAGNOSIS — E119 Type 2 diabetes mellitus without complications: Secondary | ICD-10-CM | POA: Diagnosis not present

## 2015-04-07 DIAGNOSIS — E785 Hyperlipidemia, unspecified: Secondary | ICD-10-CM | POA: Diagnosis not present

## 2015-04-07 DIAGNOSIS — I6523 Occlusion and stenosis of bilateral carotid arteries: Secondary | ICD-10-CM | POA: Diagnosis not present

## 2015-04-07 DIAGNOSIS — E669 Obesity, unspecified: Secondary | ICD-10-CM | POA: Diagnosis not present

## 2015-04-14 ENCOUNTER — Ambulatory Visit (INDEPENDENT_AMBULATORY_CARE_PROVIDER_SITE_OTHER): Payer: Medicare Other | Admitting: Cardiology

## 2015-04-14 ENCOUNTER — Encounter: Payer: Self-pay | Admitting: Cardiology

## 2015-04-14 VITALS — BP 140/60 | HR 70 | Ht 62.5 in | Wt 185.8 lb

## 2015-04-14 DIAGNOSIS — R0989 Other specified symptoms and signs involving the circulatory and respiratory systems: Secondary | ICD-10-CM | POA: Diagnosis not present

## 2015-04-14 DIAGNOSIS — I350 Nonrheumatic aortic (valve) stenosis: Secondary | ICD-10-CM

## 2015-04-14 NOTE — Patient Instructions (Signed)
Medication Instructions:  Your physician recommends that you continue on your current medications as directed. Please refer to the Current Medication list given to you today.  Labwork: NONE  Testing/Procedures: NONE  Follow-Up: Your physician wants you to follow-up in: 1 YEAR OV/EKG  You will receive a reminder letter in the mail two months in advance. If you don't receive a letter, please call our office to schedule the follow-up appointment.    

## 2015-04-14 NOTE — Progress Notes (Signed)
Cardiology Office Note   Date:  04/14/2015   ID:  Dana Sandoval, DOB [birth year], MRN [id number]  PCP:  Dwana Melena, MD  Cardiologist: Cassell Clement MD  No chief complaint on file.     History of Present Illness: Dana Sandoval is a 74 y.o. female who presents for a one-year office visit.  This pleasant 74 year old Caucasian female is seen for a scheduled one year followup office visit. She is a medical patient of Dr. Catalina Pizza. She has a history of aortic moderate stenosis. She does not have any history of ischemic heart disease. She had a normal LexiScan Cardiolite stress test on 09/13/09 which showed no ischemia and showed an ejection fraction of 83% and no regional wall motion abnormalities. The patient has a history of diabetes. She is not having any hypoglycemic episodes. She has a history of hypercholesterolemia.  Her  echocardiogram on 04/03/13 showed normal left ventricular systolic function with ejection fraction 55-65% and grade 2 diastolic dysfunction. She had moderate aortic stenosis with peak aortic gradient of 43 and a mean gradient of 21.  She had an update of her echocardiogram on 04/07/15 which showed stable mild to moderate aortic stenosis.  Her peak gradient was 37 and mean gradient was 20 and her ejection fraction was 65-70%. The patient also had a recent carotid Doppler because of bilateral carotid bruits.  This showed no significant obstruction and no increased velocities were noted.  There was minimal plaque noted.  A repeat ultrasound in 2 years was recommended. Symptomatically the patient has been fine.  She is not having any chest pain or shortness of breath.  No orthopnea or peripheral edema. Her weight is down 13 pounds since last year.  Past Medical History  Diagnosis Date  . Ischemic heart disease   . Pain     INTRASCAPULAR PAIN  . Diabetes mellitus   . Thyroid disease     HYPOTHYROIDISM  . Hyperlipidemia   . IBS (irritable bowel syndrome)   .  SAB (spontaneous abortion)     X 2  . NSVD (normal spontaneous vaginal delivery)     X 4  . Heart murmur     aortic stenosis    Past Surgical History  Procedure Laterality Date  . Doppler echocardiography  2009    HAD AN ECHOCARDIOGRAM THAT SHOWED MODERATE MITRAL  ANNULAR CALCIFICATION AND A NORMAL EJECTION FRACTION OF 55%  . Other surgical history      HYSTERECTOMY  . Thyroidectomy    . Shoulder surgery      RIGHT SHOULDER SURGERY  . Dilation and curettage of uterus    . Abdominal hysterectomy  1977    TAH  (FIBROIDS)  . Total knee arthroplasty  07/12/2010    RIGHT     Current Outpatient Prescriptions  Medication Sig Dispense Refill  . aspirin 81 MG tablet Take 81 mg by mouth every other day.     Marland Kitchen atorvastatin (LIPITOR) 40 MG tablet Take 40 mg by mouth daily at 6 PM.     . calcium gluconate 500 MG tablet Take 500 mg by mouth every morning.      Marland Kitchen CALCIUM-MAGNESIUM-ZINC PO Take 1 tablet by mouth daily.    Marland Kitchen levothyroxine (SYNTHROID, LEVOTHROID) 100 MCG tablet Take 100 mcg by mouth daily.     . metFORMIN (GLUCOPHAGE-XR) 500 MG 24 hr tablet Take 500 mg by mouth 2 (two) times daily.     Marland Kitchen NITROGLYCERIN PO Take 1 tablet by mouth  as needed (chest pain (pt doesnt know dose. never had to use it)).     . Omega-3 Fatty Acids (FISH OIL PO) Take 1 capsule by mouth daily.     . ramipril (ALTACE) 10 MG capsule Take 10 mg by mouth daily.       No current facility-administered medications for this visit.    Allergies:   Codeine and Dilaudid    Social History:  The patient  reports that she has never smoked. She has never used smokeless tobacco. She reports that she does not drink alcohol or use illicit drugs.   Family History:  The patient's family history includes Cancer in her brother and father; Diabetes in her maternal aunt and maternal uncle; Heart disease in her father and mother; Osteoporosis in her mother.    ROS:  Please see the history of present illness.   Otherwise,  review of systems are positive for none.   All other systems are reviewed and negative.    PHYSICAL EXAM: VS:  BP 140/60 mmHg  Pulse 70  Ht 5' 2.5" (1.588 m)  Wt 185 lb 12.8 oz (84.278 kg)  BMI 33.42 kg/m2 , BMI Body mass index is 33.42 kg/(m^2). GEN: Well nourished, well developed, in no acute distress HEENT: normal Neck: no JVD, carotid bruits, or masses Cardiac: RRR; there is a grade 3/6 harsh systolic ejection murmur at the aortic area which radiates up to both carotids. Respiratory:  clear to auscultation bilaterally, normal work of breathing GI: soft, nontender, nondistended, + BS MS: no deformity or atrophy Skin: warm and dry, no rash Neuro:  Strength and sensation are intact Psych: euthymic mood, full affect   EKG:  EKG is ordered today. The ekg ordered today demonstrates normal sinus rhythm and is within normal limits   Recent Labs: No results found for requested labs within last 365 days.    Lipid Panel No results found for: CHOL, TRIG, HDL, CHOLHDL, VLDL, LDLCALC, LDLDIRECT    Wt Readings from Last 3 Encounters:  04/14/15 185 lb 12.8 oz (84.278 kg)  06/17/14 198 lb 12.8 oz (90.175 kg)  04/08/14 193 lb 12.8 oz (87.907 kg)         ASSESSMENT AND PLAN:  1.  Mild-moderate aortic stenosis. 2. Hypercholesterolemia 3. diabetes mellitus 4. exogenous obesity  Disposition: Continue on same medication.Recheck in one year for follow-up office visit and EKG   Current medicines are reviewed at length with the patient today.  The patient does not have concerns regarding medicines.  The following changes have been made:  no change  Labs/ tests ordered today include:  No orders of the defined types were placed in this encounter.       Karie Schwalbe MD 04/14/2015 5:36 PM    [facility] [address] [place], [place], [place]  [address] Phone: [phone]; Fax: [phone]

## 2015-06-21 ENCOUNTER — Encounter: Payer: Self-pay | Admitting: Gynecology

## 2015-06-21 ENCOUNTER — Ambulatory Visit (INDEPENDENT_AMBULATORY_CARE_PROVIDER_SITE_OTHER): Payer: Medicare Other | Admitting: Gynecology

## 2015-06-21 VITALS — BP 126/72 | Ht 62.0 in | Wt 189.0 lb

## 2015-06-21 DIAGNOSIS — M8580 Other specified disorders of bone density and structure, unspecified site: Secondary | ICD-10-CM

## 2015-06-21 DIAGNOSIS — Z01419 Encounter for gynecological examination (general) (routine) without abnormal findings: Secondary | ICD-10-CM | POA: Diagnosis not present

## 2015-06-21 DIAGNOSIS — Z8742 Personal history of other diseases of the female genital tract: Secondary | ICD-10-CM | POA: Diagnosis not present

## 2015-06-21 NOTE — Progress Notes (Signed)
Dana Sandoval January 11, 1941 [id number]   History:    74 y.o.  for annual gyn exam with no complaints today. Patient with history of osteopenia. Her last bone density study 2016 at a different facility then hours had indicated that her right femoral neck T score was -1.1 with normal Frax analysis there was statistically significant decrease of bone mineralization of the left femoral neck of 5% and the right hip and spine with no statistical change. Her PCP is Dr. Margo Aye was been doing her blood work. She was recently evaluated by her cardiologist with demonstrated she has mild/moderate aortic stenosis along with her hypercholesterolemia and diabetes mellitus. They have been doing her blood work. Her last colonoscopy was normal in 2009. Patient with prior history of abdominal hysterectomy and no prior history of abnormal Pap smears. All her vaccines are up-to-date.  Past medical history,surgical history, family history and social history were all reviewed and documented in the EPIC chart.  Gynecologic History No LMP recorded. Patient has had a hysterectomy. Contraception: status post hysterectomy Last Pap: 2012. Results were: normal Last mammogram: 2016. Results were: normal  Obstetric History OB History  Gravida Para Term Preterm AB SAB TAB Ectopic Multiple Living  # Outcome Date GA Lbr Len/2nd Weight Sex Delivery Anes PTL Lv  6 SAB           5 SAB           4 Term     M Vag-Spont  N Y  3 Term     F Vag-Spont  N Y  2 Term     F Vag-Spont  N Y  1 Term     F Vag-Spont  N Y       ROS: A ROS was performed and pertinent positives and negatives are included in the history.  GENERAL: No fevers or chills. HEENT: No change in vision, no earache, sore throat or sinus congestion. NECK: No pain or stiffness. CARDIOVASCULAR: No chest pain or pressure. No palpitations. PULMONARY: No shortness of breath, cough or wheeze. GASTROINTESTINAL: No abdominal pain, nausea, vomiting or  diarrhea, melena or bright red blood per rectum. GENITOURINARY: No urinary frequency, urgency, hesitancy or dysuria. MUSCULOSKELETAL: No joint or muscle pain, no back pain, no recent trauma. DERMATOLOGIC: No rash, no itching, no lesions. ENDOCRINE: No polyuria, polydipsia, no heat or cold intolerance. No recent change in weight. HEMATOLOGICAL: No anemia or easy bruising or bleeding. NEUROLOGIC: No headache, seizures, numbness, tingling or weakness. PSYCHIATRIC: No depression, no loss of interest in normal activity or change in sleep pattern.     Exam: chaperone present  BP 126/72 mmHg  Ht  (1.575 m)  Wt 189 lb (85.73 kg)  BMI 34.56 kg/m2  Body mass index is 34.56 kg/(m^2).  General appearance : Well developed well nourished female. No acute distress HEENT: Eyes: no retinal hemorrhage or exudates,  Neck supple, trachea midline, no carotid bruits, no thyroidmegaly Lungs: Clear to auscultation, no rhonchi or wheezes, or rib retractions  Heart: Regular rate and rhythm, no murmurs or gallops Breast:Examined in sitting and supine position were symmetrical in appearance, no palpable masses or tenderness,  no skin retraction, no nipple inversion, no nipple discharge, no skin discoloration, no axillary or supraclavicular lymphadenopathy Abdomen: no palpable masses or tenderness, no rebound or guarding Extremities: no edema or skin discoloration or tenderness  Pelvic:  Bartholin, Urethra, Skene Glands: Within normal limits  Vagina: No gross lesions or discharge, atrophic changes  Cervix: Absent  Uterus  absent  Adnexa  Without masses or tenderness  Anus and perineum  normal   Rectovaginal  normal sphincter tone without palpated masses or tenderness             Hemoccult PCP provides     Assessment/Plan:  74 y.o. female for annual exam with history of osteopenia. PCP currently treating her for vitamin D deficiency for which she's currently on 50,000 units every weekly. Patient  not sexually active vaginal atrophy not giving her any problems. Patient on no HRT. We discussed importance of calcium vitamin D and weightbearing exercises for osteoporosis prevention. PCP has been doing her blood work. All vaccines up-to-date. We'll see her back in one year or when necessary.   Ok EdwardsFERNANDEZ,JUAN H MD, 10:58 AM 06/21/2015

## 2015-07-13 ENCOUNTER — Telehealth: Payer: Self-pay | Admitting: Cardiology

## 2015-07-13 NOTE — Telephone Encounter (Signed)
New message     Dr.Fuller calling want to know why a recent dx of CHF on patient chart.

## 2015-07-13 NOTE — Telephone Encounter (Signed)
She does not have clinical heart failure. She has diastolic dysfunction by echo.

## 2015-07-14 ENCOUNTER — Encounter: Payer: Self-pay | Admitting: Cardiology

## 2015-07-14 NOTE — Telephone Encounter (Signed)
Spoke with Tomi BambergerSusan Fuller PA and she received last ov from  Dr. Patty SermonsBrackbill  On patient history ischemic heart disease listed but stated no ischemic heart disease in office note Discussed with  Dr. Patty SermonsBrackbill and no ischemic heart disease as dictated, Celesta GentileSusan F PA aware

## 2015-07-19 ENCOUNTER — Other Ambulatory Visit: Payer: Medicare Other

## 2015-07-19 ENCOUNTER — Ambulatory Visit: Payer: Medicare Other | Admitting: Gynecology

## 2015-07-30 ENCOUNTER — Encounter: Payer: Self-pay | Admitting: Gynecology

## 2015-07-30 ENCOUNTER — Other Ambulatory Visit: Payer: Self-pay | Admitting: Gynecology

## 2015-07-30 ENCOUNTER — Ambulatory Visit (INDEPENDENT_AMBULATORY_CARE_PROVIDER_SITE_OTHER): Payer: Medicare Other | Admitting: Gynecology

## 2015-07-30 ENCOUNTER — Ambulatory Visit (INDEPENDENT_AMBULATORY_CARE_PROVIDER_SITE_OTHER): Payer: Medicare Other

## 2015-07-30 VITALS — BP 136/78

## 2015-07-30 DIAGNOSIS — Z8742 Personal history of other diseases of the female genital tract: Secondary | ICD-10-CM

## 2015-07-30 DIAGNOSIS — N83202 Unspecified ovarian cyst, left side: Secondary | ICD-10-CM

## 2015-07-30 NOTE — Progress Notes (Signed)
   Patient is a 75 year old was seen the office for annual exam December 2016. She is here today for follow-up on a small ovarian cyst that she's had present for many years. Patient with past history of abdominal hysterectomy.  Ultrasound today: Absent uterus. Right ovary normal echo pattern. Left ovary with a small echo-free cyst measuring 6 x 4 mm negative color flow ultrasound from 2009 was compared which time it measured 11 x 10 mm. No fluid in the cul-de-sac normal. Adnexal masses.  Assessment/plan: Postmenopausal patient with small echo-free cyst decreased in size from 2009. Last year she had a normal CA 125 will repeat this year. Patient otherwise will be seen in one year or when necessary. Patient is asymptomatic. Features of the cyst appeared completely normal with decreasing size of the past few years. Patient reassured.

## 2015-08-02 LAB — CA 125: CA 125: 5 U/mL (ref <35)

## 2015-11-16 ENCOUNTER — Encounter: Payer: Self-pay | Admitting: Gastroenterology

## 2016-03-21 ENCOUNTER — Encounter: Payer: Self-pay | Admitting: Gynecology

## 2016-06-21 ENCOUNTER — Encounter: Payer: Self-pay | Admitting: Gynecology

## 2016-06-21 ENCOUNTER — Ambulatory Visit (INDEPENDENT_AMBULATORY_CARE_PROVIDER_SITE_OTHER): Payer: Medicare Other | Admitting: Gynecology

## 2016-06-21 VITALS — BP 136/74 | Ht 62.0 in | Wt 180.4 lb

## 2016-06-21 DIAGNOSIS — Z01419 Encounter for gynecological examination (general) (routine) without abnormal findings: Secondary | ICD-10-CM

## 2016-06-21 NOTE — Progress Notes (Signed)
Dana Sandoval [birth year] [id number]   History:    75 y.o.  for annual gyn exam with no complaints today. Patient is on no hormone replacement therapy. Patient with history of osteopenia. Her last bone density study 2016 at a different facility then hours had indicated that her right femoral neck T score was -1.1 with normal Frax analysis there was statistically significant decrease of bone mineralization of the left femoral neck of 5% and the right hip and spine with no statistical change. Her PCP is Dr. Margo AyeHall was been doing her blood work. She was recently evaluated by her cardiologist with demonstrated she has mild/moderate aortic stenosis along with her hypercholesterolemia and diabetes mellitus. They have been doing her blood work. Her last colonoscopy was normal in 2009 and she is on 10 year recall.Patient with prior history of abdominal hysterectomy and no prior history of abnormal Pap smears. All her vaccines are up-to-date.   Past medical history,surgical history, family history and social history were all reviewed and documented in the EPIC chart.  Gynecologic History No LMP recorded. Patient has had a hysterectomy. Contraception: status post hysterectomy Last Pap: Several years ago. Results were: normal Last mammogram: 2017. Results were: normal  Obstetric History OB History  Gravida Para Term Preterm AB Living  6 4 4   2 4   SAB TAB Ectopic Multiple Live Births  2       4    # Outcome Date GA Lbr Len/2nd Weight Sex Delivery Anes PTL Lv  6 SAB           5 SAB           4 Term     M Vag-Spont  N LIV  3 Term     F Vag-Spont  N LIV  2 Term     F Vag-Spont  N LIV  1 Term     F Vag-Spont  N LIV       ROS: A ROS was performed and pertinent positives and negatives are included in the history.  GENERAL: No fevers or chills. HEENT: No change in vision, no earache, sore throat or sinus congestion. NECK: No pain or stiffness. CARDIOVASCULAR: No chest pain or pressure. No  palpitations. PULMONARY: No shortness of breath, cough or wheeze. GASTROINTESTINAL: No abdominal pain, nausea, vomiting or diarrhea, melena or bright red blood per rectum. GENITOURINARY: No urinary frequency, urgency, hesitancy or dysuria. MUSCULOSKELETAL: No joint or muscle pain, no back pain, no recent trauma. DERMATOLOGIC: No rash, no itching, no lesions. ENDOCRINE: No polyuria, polydipsia, no heat or cold intolerance. No recent change in weight. HEMATOLOGICAL: No anemia or easy bruising or bleeding. NEUROLOGIC: No headache, seizures, numbness, tingling or weakness. PSYCHIATRIC: No depression, no loss of interest in normal activity or change in sleep pattern.     Exam: chaperone present  BP 136/74   Ht 5\' 2"  (1.575 m)   Wt 180 lb 6.4 oz (81.8 kg)   BMI 33.00 kg/m   Body mass index is 33 kg/m.  General appearance : Well developed well nourished female. No acute distress HEENT: Eyes: no retinal hemorrhage or exudates,  Neck supple, trachea midline, no carotid bruits, no thyroidmegaly Lungs: Clear to auscultation, no rhonchi or wheezes, or rib retractions  Heart: Regular rate and rhythm, no murmurs or gallops Breast:Examined in sitting and supine position were symmetrical in appearance, no palpable masses or tenderness,  no skin retraction, no nipple inversion, no nipple discharge, no skin discoloration, no axillary or  supraclavicular lymphadenopathy Abdomen: no palpable masses or tenderness, no rebound or guarding Extremities: no edema or skin discoloration or tenderness  Pelvic:  Bartholin, Urethra, Skene Glands: Within normal limits             Vagina: No gross lesions or discharge  Cervix: Absent  Uterus  absent  Adnexa  Without masses or tenderness  Anus and perineum  normal   Rectovaginal  normal sphincter tone without palpated masses or tenderness             Hemoccult PCP provides     Assessment/Plan:  75 y.o. female for annual exam will no longer need Pap smears according  to the new guidelines. Patient has had her blood work and her vaccines by her PCP. Patient was encouraged to continue to take her calcium vitamin D and weightbearing exercises for osteoporosis prevention.   Ok EdwardsFERNANDEZ,Caryssa Elzey H MD, 9:16 AM 06/21/2016

## 2016-11-22 ENCOUNTER — Encounter: Payer: Self-pay | Admitting: Gynecology

## 2017-03-22 ENCOUNTER — Encounter: Payer: Self-pay | Admitting: Obstetrics & Gynecology

## 2017-03-29 ENCOUNTER — Encounter: Payer: Self-pay | Admitting: Anesthesiology

## 2017-05-17 LAB — HM DIABETES EYE EXAM

## 2017-06-22 ENCOUNTER — Encounter: Payer: Self-pay | Admitting: Obstetrics & Gynecology

## 2017-06-22 ENCOUNTER — Ambulatory Visit (INDEPENDENT_AMBULATORY_CARE_PROVIDER_SITE_OTHER): Payer: Medicare Other | Admitting: Obstetrics & Gynecology

## 2017-06-22 VITALS — BP 132/70 | Ht 62.0 in | Wt 183.0 lb

## 2017-06-22 DIAGNOSIS — E6609 Other obesity due to excess calories: Secondary | ICD-10-CM

## 2017-06-22 DIAGNOSIS — M8580 Other specified disorders of bone density and structure, unspecified site: Secondary | ICD-10-CM | POA: Diagnosis not present

## 2017-06-22 DIAGNOSIS — Z780 Asymptomatic menopausal state: Secondary | ICD-10-CM | POA: Diagnosis not present

## 2017-06-22 DIAGNOSIS — Z01411 Encounter for gynecological examination (general) (routine) with abnormal findings: Secondary | ICD-10-CM

## 2017-06-22 DIAGNOSIS — Z1272 Encounter for screening for malignant neoplasm of vagina: Secondary | ICD-10-CM

## 2017-06-22 DIAGNOSIS — Z6833 Body mass index (BMI) 33.0-33.9, adult: Secondary | ICD-10-CM

## 2017-06-22 NOTE — Patient Instructions (Signed)
1. Encounter for gynecological examination with abnormal finding Gynecologic exam status post hysterectomy.  Pap reflex done at the vaginal vault.  Breast exam normal.  Screening mammogram benign September 2018.  Will repeat colonoscopy next year.  2. Menopause present Menopause, well without hormone replacement therapy.  Status post hysterectomy.  3. Osteopenia, unspecified location Osteopenia with T score at -1.2.  Will continue with vitamin D supplements, calcium rich nutrition and will add weightbearing physical activity after her left knee surgery is completed.  4. Class 1 obesity due to excess calories without serious comorbidity with body mass index (BMI) of 33.0 to 33.9 in adult Lower calorie and carb diet.  Encouraged to do upper body exercises with small weight lifting and stretching exercises until left knee surgery is completed.  Will do more aerobic physical activity after that.  Dana Sandoval, it was a pleasure meeting you today!  I will inform you of your results as soon as available.   Health Maintenance for Postmenopausal Women Menopause is a normal process in which your reproductive ability comes to an end. This process happens gradually over a span of months to years, usually between the ages of 69 and 14. Menopause is complete when you have missed 12 consecutive menstrual periods. It is important to talk with your health care provider about some of the most common conditions that affect postmenopausal women, such as heart disease, cancer, and bone loss (osteoporosis). Adopting a healthy lifestyle and getting preventive care can help to promote your health and wellness. Those actions can also lower your chances of developing some of these common conditions. What should I know about menopause? During menopause, you may experience a number of symptoms, such as:  Moderate-to-severe hot flashes.  Night sweats.  Decrease in sex drive.  Mood  swings.  Headaches.  Tiredness.  Irritability.  Memory problems.  Insomnia.  Choosing to treat or not to treat menopausal changes is an individual decision that you make with your health care provider. What should I know about hormone replacement therapy and supplements? Hormone therapy products are effective for treating symptoms that are associated with menopause, such as hot flashes and night sweats. Hormone replacement carries certain risks, especially as you become older. If you are thinking about using estrogen or estrogen with progestin treatments, discuss the benefits and risks with your health care provider. What should I know about heart disease and stroke? Heart disease, heart attack, and stroke become more likely as you age. This may be due, in part, to the hormonal changes that your body experiences during menopause. These can affect how your body processes dietary fats, triglycerides, and cholesterol. Heart attack and stroke are both medical emergencies. There are many things that you can do to help prevent heart disease and stroke:  Have your blood pressure checked at least every 1-2 years. High blood pressure causes heart disease and increases the risk of stroke.  If you are 38-56 years old, ask your health care provider if you should take aspirin to prevent a heart attack or a stroke.  Do not use any tobacco products, including cigarettes, chewing tobacco, or electronic cigarettes. If you need help quitting, ask your health care provider.  It is important to eat a healthy diet and maintain a healthy weight. ? Be sure to include plenty of vegetables, fruits, low-fat dairy products, and lean protein. ? Avoid eating foods that are high in solid fats, added sugars, or salt (sodium).  Get regular exercise. This is one of the most  important things that you can do for your health. ? Try to exercise for at least 150 minutes each week. The type of exercise that you do should  increase your heart rate and make you sweat. This is known as moderate-intensity exercise. ? Try to do strengthening exercises at least twice each week. Do these in addition to the moderate-intensity exercise.  Know your numbers.Ask your health care provider to check your cholesterol and your blood glucose. Continue to have your blood tested as directed by your health care provider.  What should I know about cancer screening? There are several types of cancer. Take the following steps to reduce your risk and to catch any cancer development as early as possible. Breast Cancer  Practice breast self-awareness. ? This means understanding how your breasts normally appear and feel. ? It also means doing regular breast self-exams. Let your health care provider know about any changes, no matter how small.  If you are 19 or older, have a clinician do a breast exam (clinical breast exam or CBE) every year. Depending on your age, family history, and medical history, it may be recommended that you also have a yearly breast X-ray (mammogram).  If you have a family history of breast cancer, talk with your health care provider about genetic screening.  If you are at high risk for breast cancer, talk with your health care provider about having an MRI and a mammogram every year.  Breast cancer (BRCA) gene test is recommended for women who have family members with BRCA-related cancers. Results of the assessment will determine the need for genetic counseling and BRCA1 and for BRCA2 testing. BRCA-related cancers include these types: ? Breast. This occurs in males or females. ? Ovarian. ? Tubal. This may also be called fallopian tube cancer. ? Cancer of the abdominal or pelvic lining (peritoneal cancer). ? Prostate. ? Pancreatic.  Cervical, Uterine, and Ovarian Cancer Your health care provider may recommend that you be screened regularly for cancer of the pelvic organs. These include your ovaries, uterus,  and vagina. This screening involves a pelvic exam, which includes checking for microscopic changes to the surface of your cervix (Pap test).  For women ages 21-65, health care providers may recommend a pelvic exam and a Pap test every three years. For women ages 74-65, they may recommend the Pap test and pelvic exam, combined with testing for human papilloma virus (HPV), every five years. Some types of HPV increase your risk of cervical cancer. Testing for HPV may also be done on women of any age who have unclear Pap test results.  Other health care providers may not recommend any screening for nonpregnant women who are considered low risk for pelvic cancer and have no symptoms. Ask your health care provider if a screening pelvic exam is right for you.  If you have had past treatment for cervical cancer or a condition that could lead to cancer, you need Pap tests and screening for cancer for at least 20 years after your treatment. If Pap tests have been discontinued for you, your risk factors (such as having a new sexual partner) need to be reassessed to determine if you should start having screenings again. Some women have medical problems that increase the chance of getting cervical cancer. In these cases, your health care provider may recommend that you have screening and Pap tests more often.  If you have a family history of uterine cancer or ovarian cancer, talk with your health care provider about genetic  screening.  If you have vaginal bleeding after reaching menopause, tell your health care provider.  There are currently no reliable tests available to screen for ovarian cancer.  Lung Cancer Lung cancer screening is recommended for adults 62-55 years old who are at high risk for lung cancer because of a history of smoking. A yearly low-dose CT scan of the lungs is recommended if you:  Currently smoke.  Have a history of at least 30 pack-years of smoking and you currently smoke or have quit  within the past 15 years. A pack-year is smoking an average of one pack of cigarettes per day for one year.  Yearly screening should:  Continue until it has been 15 years since you quit.  Stop if you develop a health problem that would prevent you from having lung cancer treatment.  Colorectal Cancer  This type of cancer can be detected and can often be prevented.  Routine colorectal cancer screening usually begins at age 20 and continues through age 29.  If you have risk factors for colon cancer, your health care provider may recommend that you be screened at an earlier age.  If you have a family history of colorectal cancer, talk with your health care provider about genetic screening.  Your health care provider may also recommend using home test kits to check for hidden blood in your stool.  A small camera at the end of a tube can be used to examine your colon directly (sigmoidoscopy or colonoscopy). This is done to check for the earliest forms of colorectal cancer.  Direct examination of the colon should be repeated every 5-10 years until age 60. However, if early forms of precancerous polyps or small growths are found or if you have a family history or genetic risk for colorectal cancer, you may need to be screened more often.  Skin Cancer  Check your skin from head to toe regularly.  Monitor any moles. Be sure to tell your health care provider: ? About any new moles or changes in moles, especially if there is a change in a mole's shape or color. ? If you have a mole that is larger than the size of a pencil eraser.  If any of your family members has a history of skin cancer, especially at a young age, talk with your health care provider about genetic screening.  Always use sunscreen. Apply sunscreen liberally and repeatedly throughout the day.  Whenever you are outside, protect yourself by wearing long sleeves, pants, a wide-brimmed hat, and sunglasses.  What should I know  about osteoporosis? Osteoporosis is a condition in which bone destruction happens more quickly than new bone creation. After menopause, you may be at an increased risk for osteoporosis. To help prevent osteoporosis or the bone fractures that can happen because of osteoporosis, the following is recommended:  If you are 60-50 years old, get at least 1,000 mg of calcium and at least 600 mg of vitamin D per day.  If you are older than age 28 but younger than age 73, get at least 1,200 mg of calcium and at least 600 mg of vitamin D per day.  If you are older than age 36, get at least 1,200 mg of calcium and at least 800 mg of vitamin D per day.  Smoking and excessive alcohol intake increase the risk of osteoporosis. Eat foods that are rich in calcium and vitamin D, and do weight-bearing exercises several times each week as directed by your health care provider.  What should I know about how menopause affects my mental health? Depression may occur at any age, but it is more common as you become older. Common symptoms of depression include:  Low or sad mood.  Changes in sleep patterns.  Changes in appetite or eating patterns.  Feeling an overall lack of motivation or enjoyment of activities that you previously enjoyed.  Frequent crying spells.  Talk with your health care provider if you think that you are experiencing depression. What should I know about immunizations? It is important that you get and maintain your immunizations. These include:  Tetanus, diphtheria, and pertussis (Tdap) booster vaccine.  Influenza every year before the flu season begins.  Pneumonia vaccine.  Shingles vaccine.  Your health care provider may also recommend other immunizations. This information is not intended to replace advice given to you by your health care provider. Make sure you discuss any questions you have with your health care provider. Document Released: 08/18/2005 Document Revised: 01/14/2016  Document Reviewed: 03/30/2015 Elsevier Interactive Patient Education  2018 [employer].

## 2017-06-22 NOTE — Addendum Note (Signed)
Addended by: Berna SpareASTILLO, Lynsey Ange A on: 06/22/2017 09:25 AM   Modules accepted: Orders

## 2017-06-22 NOTE — Progress Notes (Signed)
Dana Sandoval Dana Sandoval 1940-07-18 [id number]   History:    76 y.o. [name]  Married.  Has grandchildren and great grandchildren.  RP:  Established patient presenting for annual gyn exam   HPI:  Menopause, well on no HRT.  S/P Hysterectomy.  No pelvic pain.  Breasts wnl.  Urine/BMs wnl.  Not exercising very much currently, left knee surgery to be performed soon.  Body mass index 33.47.  Past medical history,surgical history, family history and social history were all reviewed and documented in the EPIC chart.  Gynecologic History No LMP recorded. Patient has had a hysterectomy. Contraception: status post hysterectomy8 Last Pap: 2012. Results were: normal Last mammogram: 03/2017. Results were: Benign Colono 2009, repeat next year Bone Density 03/2017:  Osteopenia T-Score -1.2  Obstetric History OB History  Gravida Para Term Preterm AB Living  6 4 4   2 4   SAB TAB Ectopic Multiple Live Births  2       4    # Outcome Date GA Lbr Len/2nd Weight Sex Delivery Anes PTL Lv  6 SAB           5 SAB           4 Term     M Vag-Spont  N LIV  3 Term     F Vag-Spont  N LIV  2 Term     F Vag-Spont  N LIV  1 Term     F Vag-Spont  N LIV       ROS: A ROS was performed and pertinent positives and negatives are included in the history.  GENERAL: No fevers or chills. HEENT: No change in vision, no earache, sore throat or sinus congestion. NECK: No pain or stiffness. CARDIOVASCULAR: No chest pain or pressure. No palpitations. PULMONARY: No shortness of breath, cough or wheeze. GASTROINTESTINAL: No abdominal pain, nausea, vomiting or diarrhea, melena or bright red blood per rectum. GENITOURINARY: No urinary frequency, urgency, hesitancy or dysuria. MUSCULOSKELETAL: No joint or muscle pain, no back pain, no recent trauma. DERMATOLOGIC: No rash, no itching, no lesions. ENDOCRINE: No polyuria, polydipsia, no heat or cold intolerance. No recent change in weight. HEMATOLOGICAL: No anemia or easy bruising or  bleeding. NEUROLOGIC: No headache, seizures, numbness, tingling or weakness. PSYCHIATRIC: No depression, no loss of interest in normal activity or change in sleep pattern.     Exam:   Ht 5\' 2"  (1.575 m)   Wt 183 lb (83 kg)   BMI 33.47 kg/m   Body mass index is 33.47 kg/m.  General appearance : Well developed well nourished female. No acute distress HEENT: Eyes: no retinal hemorrhage or exudates,  Neck supple, trachea midline, no carotid bruits, no thyroidmegaly Lungs: Clear to auscultation, no rhonchi or wheezes, or rib retractions  Heart: Regular rate and rhythm, no murmurs or gallops Breast:Examined in sitting and supine position were symmetrical in appearance, no palpable masses or tenderness,  no skin retraction, no nipple inversion, no nipple discharge, no skin discoloration, no axillary or supraclavicular lymphadenopathy Abdomen: no palpable masses or tenderness, no rebound or guarding Extremities: no edema or skin discoloration or tenderness  Pelvic: Vulva normal  Bartholin, Urethra, Skene Glands: Within normal limits             Vagina: No gross lesions or discharge.  Pap reflex done at vaginal vault.  Cervix/Uterus absent  Adnexa  Without masses or tenderness  Anus and perineum  normal    Assessment/Plan:  76 y.o. female for annual exam  1. Encounter for gynecological examination with abnormal finding Gynecologic exam status post hysterectomy.  Pap reflex done at the vaginal vault.  Breast exam normal.  Screening mammogram benign September 2018.  Will repeat colonoscopy next year.  2. Menopause present Menopause, well without hormone replacement therapy.  Status post hysterectomy.  3. Osteopenia, unspecified location Osteopenia with T score at -1.2.  Will continue with vitamin D supplements, calcium rich nutrition and will add weightbearing physical activity after her left knee surgery is completed.  4. Class 1 obesity due to excess calories without serious  comorbidity with body mass index (BMI) of 33.0 to 33.9 in adult Lower calorie and carb diet.  Encouraged to do upper body exercises with small weight lifting and stretching exercises until left knee surgery is completed.  Will do more aerobic physical activity after that.  Counseling on above issues more than 50% for 15 minutes.  Genia Dana Evelyn Aguinaldo MD, 8:41 AM 06/22/2017

## 2017-06-26 LAB — PAP IG W/ RFLX HPV ASCU

## 2017-11-14 LAB — COLOGUARD: COLOGUARD: POSITIVE

## 2018-02-13 ENCOUNTER — Encounter: Payer: Self-pay | Admitting: Gastroenterology

## 2018-03-20 ENCOUNTER — Encounter: Payer: Self-pay | Admitting: Family Medicine

## 2018-03-20 ENCOUNTER — Encounter (INDEPENDENT_AMBULATORY_CARE_PROVIDER_SITE_OTHER): Payer: Self-pay

## 2018-03-20 ENCOUNTER — Ambulatory Visit: Payer: Medicare Other | Admitting: Family Medicine

## 2018-03-20 VITALS — BP 122/50 | HR 85 | Temp 98.3°F | Ht 61.5 in | Wt 176.0 lb

## 2018-03-20 DIAGNOSIS — Z7689 Persons encountering health services in other specified circumstances: Secondary | ICD-10-CM

## 2018-03-20 DIAGNOSIS — Z23 Encounter for immunization: Secondary | ICD-10-CM

## 2018-03-20 DIAGNOSIS — E039 Hypothyroidism, unspecified: Secondary | ICD-10-CM

## 2018-03-20 DIAGNOSIS — E119 Type 2 diabetes mellitus without complications: Secondary | ICD-10-CM | POA: Diagnosis not present

## 2018-03-20 DIAGNOSIS — E785 Hyperlipidemia, unspecified: Secondary | ICD-10-CM | POA: Diagnosis not present

## 2018-03-20 DIAGNOSIS — R42 Dizziness and giddiness: Secondary | ICD-10-CM

## 2018-03-20 MED ORDER — GLUCOSE BLOOD VI STRP: ORAL_STRIP | 12 refills | Status: DC

## 2018-03-20 MED ORDER — LEVOTHYROXINE SODIUM 100 MCG PO TABS: 100.0000 ug | ORAL_TABLET | Freq: Every day | ORAL | 3 refills | Status: DC

## 2018-03-20 MED ORDER — METFORMIN HCL ER 500 MG PO TB24: 500.0000 mg | ORAL_TABLET | Freq: Every day | ORAL | 3 refills | Status: DC

## 2018-03-20 MED ORDER — ATORVASTATIN CALCIUM 40 MG PO TABS: 40.0000 mg | ORAL_TABLET | Freq: Every day | ORAL | 3 refills | Status: DC

## 2018-03-20 MED ORDER — RAMIPRIL 10 MG PO CAPS: 10.0000 mg | ORAL_CAPSULE | Freq: Every day | ORAL | 3 refills | Status: DC

## 2018-03-20 NOTE — Progress Notes (Addendum)
Subjective:    Patient ID: Dana Sandoval, female    DOB: 19-Apr-1941, 77 y.o.   MRN: [id number]  HPI This is a 77 yo female who presents today to establish care. Previously seen by Dr. Hal Hope- was dismissed. She is Glenna Durand grandmother. She is accompanied by her husband who is also establishing care. She enjoys going to [employer], doing puzzles. [place]. She cooks big lunch every day.   Last CPE- 12/18- gyn, last labs April. Last Hgba1c 5.9 Mammo- 03/22/17, mammogram scheduled for next week Pap- 06/22/17 Colonoscopy- Dr. Loreta Ave 6/19 Tdap- 07/09/12 Flu- annual Eye- annual Exercise- housework, nothing else.   DM type 2- got non exteded release metformin and blood sugars a little higher. Checks her blood sugar a couple of times a week Runs low 100s.   Poor balance-  Had a couple of episodes of vertigo x 2 weeks when lying on left side.  Saw Dr. Hal Hope and was referred for PT and had 2 sessions. Took 4 days of her DIL HCTZ and did not have further symptoms. Requests HCTZ today in case vertigo returns.   Aortic stenosis- sees Dr. Jacinto Halim  Knee pain- sees ortho. Needs replacement.   Denies chest pain, SOB, headaches, abdominal pain, diarrhea/constipation, leg swelling, dysuria, hematuria, frequency.   Past Medical History:  Diagnosis Date  . Diabetes mellitus   . Heart murmur    aortic stenosis  . Hyperlipidemia   . IBS (irritable bowel syndrome)   . NSVD (normal spontaneous vaginal delivery)    X 4  . Pain    INTRASCAPULAR PAIN  . SAB (spontaneous abortion)    X 2  . Thyroid disease    HYPOTHYROIDISM   Past Surgical History:  Procedure Laterality Date  . ABDOMINAL HYSTERECTOMY  1977   TAH  (FIBROIDS)  . DILATION AND CURETTAGE OF UTERUS    . DOPPLER ECHOCARDIOGRAPHY  2009   HAD AN ECHOCARDIOGRAM THAT SHOWED MODERATE MITRAL  ANNULAR CALCIFICATION AND A NORMAL EJECTION FRACTION OF 55%  . OTHER SURGICAL HISTORY     HYSTERECTOMY  . SHOULDER SURGERY     RIGHT SHOULDER SURGERY  . THYROIDECTOMY    . 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   RIGHT   Family History  Problem Relation Age of Onset  . Osteoporosis Mother   . Heart disease Mother   . Heart disease Father   . Cancer Father        BRAIN TUMOR  . Diabetes Maternal Aunt   . Diabetes Maternal Uncle   . Cancer Brother        BRAIN TUMOR   Social History   Tobacco Use  . Smoking status: Never Smoker  . Smokeless tobacco: Never Used  Substance Use Topics  . Alcohol use: No    Alcohol/week: 0.0 standard drinks  . Drug use: No      Past Medical History:  Diagnosis Date  . Diabetes mellitus   . Heart murmur    aortic stenosis  . Hyperlipidemia   . IBS (irritable bowel syndrome)   . NSVD (normal spontaneous vaginal delivery)    X 4  . Pain    INTRASCAPULAR PAIN  . SAB (spontaneous abortion)    X 2  . Thyroid disease    HYPOTHYROIDISM   Past Surgical History:  Procedure Laterality Date  . ABDOMINAL HYSTERECTOMY  1977   TAH  (FIBROIDS)  . DILATION AND CURETTAGE OF UTERUS    . DOPPLER ECHOCARDIOGRAPHY  2009  HAD AN ECHOCARDIOGRAM THAT SHOWED MODERATE MITRAL  ANNULAR CALCIFICATION AND A NORMAL EJECTION FRACTION OF 55%  . OTHER SURGICAL HISTORY     HYSTERECTOMY  . SHOULDER SURGERY     RIGHT SHOULDER SURGERY  . THYROIDECTOMY    . 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   RIGHT   Family History  Problem Relation Age of Onset  . Osteoporosis Mother   . Heart disease Mother   . Heart disease Father   . Cancer Father        BRAIN TUMOR  . Diabetes Maternal Aunt   . Diabetes Maternal Uncle   . Cancer Brother        BRAIN TUMOR   Social History   Tobacco Use  . Smoking status: Never Smoker  . Smokeless tobacco: Never Used  Substance Use Topics  . Alcohol use: No    Alcohol/week: 0.0 standard drinks  . Drug use: No      Review of Systems Per HPI    Objective:   Physical Exam  Constitutional: She is oriented to person, place, and time. She appears  well-developed and well-nourished. No distress.  HENT:  Head: Normocephalic and atraumatic.  Eyes: Conjunctivae are normal.  Cardiovascular: Normal rate and regular rhythm.  Murmur (4/6 harsh, SEM) heard. Pulmonary/Chest: Effort normal and breath sounds normal.  Musculoskeletal: She exhibits edema (trace pedal).  Neurological: She is alert and oriented to person, place, and time.  Skin: Skin is warm and dry. She is not diaphoretic.  Psychiatric: She has a normal mood and affect. Her behavior is normal. Judgment and thought content normal.  Vitals reviewed.     BP (!) 122/50 (BP Location: Right Arm, Patient Position: Sitting, Cuff Size: Normal)   Pulse 85   Temp 98.3 F (36.8 C) (Oral)   Ht 5' 1.5" (1.562 m)   Wt 176 lb (79.8 kg)   SpO2 97%   BMI 32.72 kg/m  Wt Readings from Last 3 Encounters:  03/20/18 176 lb (79.8 kg)  06/22/17 183 lb (83 kg)  06/21/16 180 lb 6.4 oz (81.8 kg)       Assessment & Plan:  1. Encounter to establish care - will request records from prior PCP - follow up in 4 months for labs/OV - Fill medication prescriptions  2. Controlled type 2 diabetes mellitus without complication, without long-term current use of insulin (HCC) - glucose blood (ACCU-CHEK AVIVA PLUS) test strip; Use as instructed  Dispense: 100 each; Refill: 12 - metFORMIN (GLUCOPHAGE-XR) 500 MG 24 hr tablet; Take 1 tablet (500 mg total) by mouth daily with breakfast.  Dispense: 90 tablet; Refill: 3 - atorvastatin (LIPITOR) 40 MG tablet; Take 1 tablet (40 mg total) by mouth daily at 6 PM.  Dispense: 90 tablet; Refill: 3 - ramipril (ALTACE) 10 MG capsule; Take 1 capsule (10 mg total) by mouth daily.  Dispense: 90 capsule; Refill: 3  3. Need for influenza vaccination - Flu vaccine HIGH DOSE PF  4. Dyslipidemia - atorvastatin (LIPITOR) 40 MG tablet; Take 1 tablet (40 mg total) by mouth daily at 6 PM.  Dispense: 90 tablet; Refill: 3  5. Acquired hypothyroidism - levothyroxine (SYNTHROID,  LEVOTHROID) 100 MCG tablet; Take 1 tablet (100 mcg total) by mouth daily.  Dispense: 90 tablet; Refill: 3  6. Vertigo - not currently having symptoms, discussed need for her to be seen if symptoms recur, that I can't send in HCTZ.    Olean Ree, FNP-BC  [facility], [facility]  03/22/2018 3:47 PM

## 2018-03-20 NOTE — Patient Instructions (Signed)
Good to see you today  Follow up in 4 months for labs and a check up

## 2018-03-22 ENCOUNTER — Encounter: Payer: Self-pay | Admitting: Family Medicine

## 2018-03-22 DIAGNOSIS — E039 Hypothyroidism, unspecified: Secondary | ICD-10-CM | POA: Insufficient documentation

## 2018-03-25 LAB — HM MAMMOGRAPHY

## 2018-04-02 ENCOUNTER — Encounter: Payer: Self-pay | Admitting: Family Medicine

## 2018-06-24 ENCOUNTER — Encounter: Payer: Self-pay | Admitting: Obstetrics & Gynecology

## 2018-06-24 ENCOUNTER — Ambulatory Visit: Payer: Medicare Other | Admitting: Obstetrics & Gynecology

## 2018-06-24 VITALS — BP 136/84 | Ht 62.0 in | Wt 177.0 lb

## 2018-06-24 DIAGNOSIS — Z90710 Acquired absence of both cervix and uterus: Secondary | ICD-10-CM | POA: Diagnosis not present

## 2018-06-24 DIAGNOSIS — Z6832 Body mass index (BMI) 32.0-32.9, adult: Secondary | ICD-10-CM

## 2018-06-24 DIAGNOSIS — M85852 Other specified disorders of bone density and structure, left thigh: Secondary | ICD-10-CM

## 2018-06-24 DIAGNOSIS — E6609 Other obesity due to excess calories: Secondary | ICD-10-CM

## 2018-06-24 DIAGNOSIS — M8588 Other specified disorders of bone density and structure, other site: Secondary | ICD-10-CM | POA: Diagnosis not present

## 2018-06-24 DIAGNOSIS — Z01419 Encounter for gynecological examination (general) (routine) without abnormal findings: Secondary | ICD-10-CM | POA: Diagnosis not present

## 2018-06-24 DIAGNOSIS — Z780 Asymptomatic menopausal state: Secondary | ICD-10-CM

## 2018-06-24 NOTE — Patient Instructions (Signed)
1. Well female exam with routine gynecological exam Gynecologic exam status post TAH in menopause.  Pap test on December 2018 was negative, no indication to repeat this year.  Breast exam normal.  Screening mammogram benign in September 2019.  Colonoscopy in 2009, will decide with family physician on indication to repeat.  Health labs with family physician.  2. S/P TAH (total abdominal hysterectomy)  3. Postmenopausal Well on no hormone replacement therapy.  4. Osteopenia of left hip Will repeat a bone density in September 2020.  Continue with vitamin D supplements, calcium intake of 1.5 g/day and try to increase weightbearing physical activity as much as the left knee allows.  Recommend a brace for the left knee to improve stability until decision is made as to proceed with surgery or not. - DG Bone Density; Future  5. Class 1 obesity due to excess calories without serious comorbidity with body mass index (BMI) of 32.0 to 32.9 in adult Lower calorie/carb diet such as [place] [place] diet recommended.  Increase fitness activities as much as possible per left knee status, water aerobic activities would be a suggestion.  Dana Sandoval, it was a pleasure seeing you today!

## 2018-06-24 NOTE — Progress Notes (Signed)
Elyse JarvisJannette B Egolf 1941/04/01 [id number]   History:    77 y.o. [id number] Married.  [place]  RP:  Established patient presenting for annual gyn exam   HPI: S/P TAH.  Postmenopausal on no HRT.  No pelvic pain.  No IC, husband had prostate cancer.  Urine/BMs normal.  Breasts normal.  Postponing Lt knee surgery for many years.  Not physically active.  BMI 32.37.  Health labs with Fam MD.  Past medical history,surgical history, family history and social history were all reviewed and documented in the EPIC chart.  Gynecologic History No LMP recorded. Patient has had a hysterectomy. Contraception: status post hysterectomy Last Pap: 06/2017. Results were: Negative Last mammogram: 03/2018. Results were: Benign Bone Density: 03/2017 Osteopenia T-Score -1.2 Colonoscopy: 2009  Obstetric History OB History  Gravida Para Term Preterm AB Living  6 4 4   2 4   SAB TAB Ectopic Multiple Live Births  2       4    # Outcome Date GA Lbr Len/2nd Weight Sex Delivery Anes PTL Lv  6 SAB           5 SAB           4 Term     M Vag-Spont  N LIV  3 Term     F Vag-Spont  N LIV  2 Term     F Vag-Spont  N LIV  1 Term     F Vag-Spont  N LIV     ROS: A ROS was performed and pertinent positives and negatives are included in the history.  GENERAL: No fevers or chills. HEENT: No change in vision, no earache, sore throat or sinus congestion. NECK: No pain or stiffness. CARDIOVASCULAR: No chest pain or pressure. No palpitations. PULMONARY: No shortness of breath, cough or wheeze. GASTROINTESTINAL: No abdominal pain, nausea, vomiting or diarrhea, melena or bright red blood per rectum. GENITOURINARY: No urinary frequency, urgency, hesitancy or dysuria. MUSCULOSKELETAL: No joint or muscle pain, no back pain, no recent trauma. DERMATOLOGIC: No rash, no itching, no lesions. ENDOCRINE: No polyuria, polydipsia, no heat or cold intolerance. No recent change in weight. HEMATOLOGICAL: No anemia or easy bruising or bleeding.  NEUROLOGIC: No headache, seizures, numbness, tingling or weakness. PSYCHIATRIC: No depression, no loss of interest in normal activity or change in sleep pattern.     Exam:   BP 136/84   Ht 5\' 2"  (1.575 m)   Wt 177 lb (80.3 kg)   BMI 32.37 kg/m   Body mass index is 32.37 kg/m.  General appearance : Well developed well nourished female. No acute distress HEENT: Eyes: no retinal hemorrhage or exudates,  Neck supple, trachea midline, no carotid bruits, no thyroidmegaly Lungs: Clear to auscultation, no rhonchi or wheezes, or rib retractions  Heart: Regular rate and rhythm, no murmurs or gallops Breast:Examined in sitting and supine position were symmetrical in appearance, no palpable masses or tenderness,  no skin retraction, no nipple inversion, no nipple discharge, no skin discoloration, no axillary or supraclavicular lymphadenopathy Abdomen: no palpable masses or tenderness, no rebound or guarding Extremities: no edema or skin discoloration or tenderness  Pelvic: Vulva: Normal             Vagina: No gross lesions or discharge  Cervix/Uterus absent  Adnexa  Without masses or tenderness  Anus: Normal   Assessment/Plan:  77 y.o. female for annual exam   1. Well female exam with routine gynecological exam Gynecologic exam status post TAH in menopause.  Pap test on December 2018 was negative, no indication to repeat this year.  Breast exam normal.  Screening mammogram benign in September 2019.  Colonoscopy in 2009, will decide with family physician on indication to repeat.  Health labs with family physician.  2. S/P TAH (total abdominal hysterectomy)  3. Postmenopausal Well on no hormone replacement therapy.  4. Osteopenia of left hip Will repeat a bone density in September 2020.  Continue with vitamin D supplements, calcium intake of 1.5 g/day and try to increase weightbearing physical activity as much as the left knee allows.  Recommend a brace for the left knee to improve  stability until decision is made as to proceed with surgery or not. - DG Bone Density; Future  5. Class 1 obesity due to excess calories without serious comorbidity with body mass index (BMI) of 32.0 to 32.9 in adult Lower calorie/carb diet such as [employer] recommended.  Increase fitness activities as much as possible per left knee status, water aerobic activities would be a suggestion.  Genia Del MD, 8:53 AM 06/24/2018

## 2018-07-12 ENCOUNTER — Other Ambulatory Visit: Payer: Self-pay | Admitting: Family Medicine

## 2018-07-12 DIAGNOSIS — E785 Hyperlipidemia, unspecified: Secondary | ICD-10-CM

## 2018-07-12 DIAGNOSIS — E039 Hypothyroidism, unspecified: Secondary | ICD-10-CM

## 2018-07-12 DIAGNOSIS — M8580 Other specified disorders of bone density and structure, unspecified site: Secondary | ICD-10-CM

## 2018-07-12 DIAGNOSIS — E119 Type 2 diabetes mellitus without complications: Secondary | ICD-10-CM

## 2018-07-12 NOTE — Progress Notes (Signed)
Labs entered for upcoming appointment.

## 2018-07-19 ENCOUNTER — Other Ambulatory Visit (INDEPENDENT_AMBULATORY_CARE_PROVIDER_SITE_OTHER): Payer: Medicare Other

## 2018-07-19 DIAGNOSIS — E785 Hyperlipidemia, unspecified: Secondary | ICD-10-CM

## 2018-07-19 DIAGNOSIS — M8580 Other specified disorders of bone density and structure, unspecified site: Secondary | ICD-10-CM

## 2018-07-19 DIAGNOSIS — E119 Type 2 diabetes mellitus without complications: Secondary | ICD-10-CM | POA: Diagnosis not present

## 2018-07-19 DIAGNOSIS — E789 Disorder of lipoprotein metabolism, unspecified: Secondary | ICD-10-CM

## 2018-07-19 DIAGNOSIS — E039 Hypothyroidism, unspecified: Secondary | ICD-10-CM

## 2018-07-19 LAB — LIPID PANEL
Cholesterol: 140 mg/dL (ref 0–200)
HDL: 38.2 mg/dL — ABNORMAL LOW (ref >39.00)
LDL Cholesterol: 79 mg/dL (ref 0–99)
NONHDL: 101.79
Total CHOL/HDL Ratio: 4
Triglycerides: 116 mg/dL (ref 0.0–149.0)
VLDL: 23.2 mg/dL (ref 0.0–40.0)

## 2018-07-19 LAB — TSH: TSH: 0.4 u[IU]/mL (ref 0.35–4.50)

## 2018-07-19 LAB — COMPREHENSIVE METABOLIC PANEL
ALT: 12 U/L (ref 0–35)
AST: 15 U/L (ref 0–37)
Albumin: 3.7 g/dL (ref 3.5–5.2)
Alkaline Phosphatase: 69 U/L (ref 39–117)
BUN: 22 mg/dL (ref 6–23)
CO2: 29 mEq/L (ref 19–32)
Calcium: 9 mg/dL (ref 8.4–10.5)
Chloride: 105 mEq/L (ref 96–112)
Creatinine, Ser: 0.8 mg/dL (ref 0.40–1.20)
GFR: 73.5 mL/min (ref >60.00)
Glucose, Bld: 140 mg/dL — ABNORMAL HIGH (ref 70–99)
Potassium: 4.1 mEq/L (ref 3.5–5.1)
Sodium: 142 mEq/L (ref 135–145)
Total Bilirubin: 0.5 mg/dL (ref 0.2–1.2)
Total Protein: 6.2 g/dL (ref 6.0–8.3)

## 2018-07-19 LAB — VITAMIN D 25 HYDROXY (VIT D DEFICIENCY, FRACTURES): VITD: 42.68 ng/mL (ref 30.00–100.00)

## 2018-07-19 LAB — MICROALBUMIN / CREATININE URINE RATIO
Creatinine,U: 117.8 mg/dL
Microalb Creat Ratio: 2.5 mg/g (ref 0.0–30.0)
Microalb, Ur: 2.9 mg/dL — ABNORMAL HIGH (ref 0.0–1.9)

## 2018-07-19 LAB — HEMOGLOBIN A1C: Hgb A1c MFr Bld: 6.5 % (ref 4.6–6.5)

## 2018-07-22 ENCOUNTER — Ambulatory Visit: Payer: Medicare Other | Admitting: Family Medicine

## 2018-07-22 ENCOUNTER — Encounter: Payer: Self-pay | Admitting: Family Medicine

## 2018-07-22 VITALS — BP 128/70 | HR 74 | Temp 98.3°F | Ht 61.5 in | Wt 183.8 lb

## 2018-07-22 DIAGNOSIS — E119 Type 2 diabetes mellitus without complications: Secondary | ICD-10-CM

## 2018-07-22 DIAGNOSIS — R42 Dizziness and giddiness: Secondary | ICD-10-CM

## 2018-07-22 DIAGNOSIS — E039 Hypothyroidism, unspecified: Secondary | ICD-10-CM

## 2018-07-22 DIAGNOSIS — E785 Hyperlipidemia, unspecified: Secondary | ICD-10-CM | POA: Diagnosis not present

## 2018-07-22 NOTE — Patient Instructions (Signed)
Good to see you today  Let me know if you need a referral for therapy for your vertigo  Follow up with Dr. Jerl Santos for your knee  Schedule a follow up in 6 months

## 2018-07-22 NOTE — Progress Notes (Signed)
Subjective:    Patient ID: Dana Sandoval, female    DOB: 02/15/1941, 78 y.o.   MRN: [id number]  HPI This is a [age] yo female who presents today for follow up of DM type 2, hyperlipidemia, hypothyroidism.   DM type 2- hgba1c from last week was 6.5. She is currently on metformin xr 500 mg daily.   Hyperlipidemia- taking atorvastatin 40 mg.   Hypothyroidism- currently on levothyroxine 100 mcg daily. TSH 0.40  Some increased incidents of vertigo. Has had this in the past. Restarted last week, no URI symptoms- cough, fever, nasal drainage, ear pain. Has noticed when she turns to the left and some with doing word search. Feels like it is coming on, but has not had full blown sensation of spinning except once.   Knee pain- chronic, follows with Dr. Yisroel Rammingaldorf.   ROS- she denies chest pain, SOB, leg swelling  Past Medical History:  Diagnosis Date  . Diabetes mellitus   . Heart murmur    aortic stenosis  . Hyperlipidemia   . IBS (irritable bowel syndrome)   . NSVD (normal spontaneous vaginal delivery)    X 4  . Pain    INTRASCAPULAR PAIN  . SAB (spontaneous abortion)    X 2  . Thyroid disease    HYPOTHYROIDISM   Past Surgical History:  Procedure Laterality Date  . ABDOMINAL HYSTERECTOMY  1977   TAH  (FIBROIDS)  . DILATION AND CURETTAGE OF UTERUS    . DOPPLER ECHOCARDIOGRAPHY  2009   HAD AN ECHOCARDIOGRAM THAT SHOWED MODERATE MITRAL  ANNULAR CALCIFICATION AND A NORMAL EJECTION FRACTION OF 55%  . OTHER SURGICAL HISTORY     HYSTERECTOMY  . SHOULDER SURGERY     RIGHT SHOULDER SURGERY  . THYROIDECTOMY    . 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   RIGHT   Family History  Problem Relation Age of Onset  . Osteoporosis Mother   . Heart disease Mother   . Heart disease Father   . Cancer Father        BRAIN TUMOR  . Diabetes Maternal Aunt   . Diabetes Maternal Uncle   . Cancer Brother        BRAIN TUMOR   Social History   Tobacco Use  . Smoking status: Never Smoker  .  Smokeless tobacco: Never Used  Substance Use Topics  . Alcohol use: No    Alcohol/week: 0.0 standard drinks  . Drug use: No      Review of Systems Per HPI    Objective:   Physical Exam Physical Exam  Constitutional: Oriented to person, place, and time. She appears well-developed and well-nourished.  HENT:  Head: Normocephalic and atraumatic.  Eyes: Conjunctivae are normal.  Neck: Normal range of motion. Neck supple.  Cardiovascular: Normal rate, regular rhythm. Murmur.   Pulmonary/Chest: Effort normal and breath sounds normal.  Musculoskeletal: No edema. Normal gait.  Neurological: Alert and oriented to person, place, and time.  Skin: Skin is warm and dry.  Psychiatric: Normal mood and affect. Behavior is normal. Judgment and thought content normal.  Vitals reviewed.  BP 128/70   Pulse 74   Temp 98.3 F (36.8 C) (Oral)   Ht 5' 1.5" (1.562 m)   Wt 183 lb 12 oz (83.3 kg)   SpO2 96%   BMI 34.16 kg/m  Wt Readings from Last 3 Encounters:  07/22/18 183 lb 12 oz (83.3 kg)  06/24/18 177 lb (80.3 kg)  03/20/18 176 lb (79.8 kg)  Assessment & Plan:  1. Controlled type 2 diabetes mellitus without complication, without long-term current use of insulin (HCC) - hgba1c at goal, continue current meds  2. Dyslipidemia - reviewed labs, continue atorvastatin  3. Acquired hypothyroidism - continue current dose   4. Vertigo - she will let me know if she needs referral to PT for vestibular work again  - follow up for AWV/CPE in 6 months   Olean Ree, FNP-BC  [facility], [facility]  07/22/2018 9:40 AM

## 2018-08-19 ENCOUNTER — Other Ambulatory Visit: Payer: Self-pay | Admitting: Orthopaedic Surgery

## 2018-08-28 ENCOUNTER — Other Ambulatory Visit (HOSPITAL_COMMUNITY): Payer: Self-pay | Admitting: *Deleted

## 2018-08-28 NOTE — Progress Notes (Signed)
heamglobin a1c 07-19-2018 epic

## 2018-08-28 NOTE — Patient Instructions (Addendum)
Reggie Gabhart Mendez  08/28/2018   Your procedure is scheduled on: 09-10-2018  Report to [facility] Main  Entrance  Report to admitting at 720 AM    Call this number if you have problems the morning of surgery [phone]   Remember: Do not eat food or drink liquids :After Midnight. BRUSH YOUR TEETH MORNING OF SURGERY AND RINSE YOUR MOUTH OUT, NO CHEWING GUM CANDY OR MINTS.  How to Manage Your Diabetes Before and After Surgery  Why is it important to control my blood sugar before and after surgery? . Improving blood sugar levels before and after surgery helps healing and can limit problems. . A way of improving blood sugar control is eating a healthy diet by: o  Eating less sugar and carbohydrates o  Increasing activity/exercise o  Talking with your doctor about reaching your blood sugar goals . High blood sugars (greater than 180 mg/dL) can raise your risk of infections and slow your recovery, so you will need to focus on controlling your diabetes during the weeks before surgery. . Make sure that the doctor who takes care of your diabetes knows about your planned surgery including the date and location.  How do I manage my blood sugar before surgery? . Check your blood sugar at least 4 times a day, starting 2 days before surgery, to make sure that the level is not too high or low. o Check your blood sugar the morning of your surgery when you wake up and every 2 hours until you get to the Short Stay unit. . If your blood sugar is less than 70 mg/dL, you will need to treat for low blood sugar: o Do not take insulin. o Treat a low blood sugar (less than 70 mg/dL) with  cup of clear juice (cranberry or Golubski), 4 glucose tablets, OR glucose gel. o Recheck blood sugar in 15 minutes after treatment (to make sure it is greater than 70 mg/dL). If your blood sugar is not greater than 70 mg/dL on recheck, call [phone] for further instructions. . Report your blood sugar to  the short stay nurse when you get to Short Stay.  . If you are admitted to the hospital after surgery: o Your blood sugar will be checked by the staff and you will probably be given insulin after surgery (instead of oral diabetes medicines) to make sure you have good blood sugar levels. o The goal for blood sugar control after surgery is 80-180 mg/dL.   WHAT DO I DO ABOUT MY DIABETES MEDICATION?  Marland Kitchen Do not take oral diabetes medicines (pills) the morning of surgery.  . THE  DAY  BEFORE SURGERY TAKE METFORMIN AS USUAL      . THE MORNING OF SURGERY DO NOT TAKE METFORMIN  . The day of surgery, do not take other diabetes injectables, including Byetta (exenatide), Bydureon (exenatide ER), Victoza (liraglutide), or Trulicity (dulaglutide).  I Reviewed and Endorsed by [facility] Patient Education Committee, August 2015   Take these medicines the morning of surgery with A SIP OF WATER: levothyroxine DO NOT TAKE ANY DIABETIC MEDICATIONS DAY OF YOUR SURGERY                               You may not have any metal on your body including hair pins and  piercings  Do not wear jewelry, make-up, lotions, powders or perfumes, deodorant             Do not wear nail polish.  Do not shave  48 hours prior to surgery.              Men may shave face and neck.   Do not bring valuables to the hospital. [facility] IS NOT             RESPONSIBLE   FOR VALUABLES.  Contacts, dentures or bridgework may not be worn into surgery.  Leave suitcase in the car. After surgery it may be brought to your room.   _____________________________________________________________________             [facility] - Preparing for Surgery Before surgery, you can play an important role.  Because skin is not sterile, your skin needs to be as free of germs as possible.  You can reduce the number of germs on your skin by washing with CHG (chlorahexidine gluconate) soap before surgery.  CHG is an antiseptic cleaner  which kills germs and bonds with the skin to continue killing germs even after washing. Please DO NOT use if you have an allergy to CHG or antibacterial soaps.  If your skin becomes reddened/irritated stop using the CHG and inform your nurse when you arrive at Short Stay. Do not shave (including legs and underarms) for at least 48 hours prior to the first CHG shower.  You may shave your face/neck. Please follow these instructions carefully:  1.  Shower with CHG Soap the night before surgery and the  morning of Surgery.  2.  If you choose to wash your hair, wash your hair first as usual with your  normal  shampoo.  3.  After you shampoo, rinse your hair and body thoroughly to remove the  shampoo.                           4.  Use CHG as you would any other liquid soap.  You can apply chg directly  to the skin and wash                       Gently with a scrungie or clean washcloth.  5.  Apply the CHG Soap to your body ONLY FROM THE NECK DOWN.   Do not use on face/ open                           Wound or open sores. Avoid contact with eyes, ears mouth and genitals (private parts).                       Wash face,  Genitals (private parts) with your normal soap.             6.  Wash thoroughly, paying special attention to the area where your surgery  will be performed.  7.  Thoroughly rinse your body with warm water from the neck down.  8.  DO NOT shower/wash with your normal soap after using and rinsing off  the CHG Soap.                9.  Pat yourself dry with a clean towel.            10.  Wear clean pajamas.  11.  Place clean sheets on your bed the night of your first shower and do not  sleep with pets. Day of Surgery : Do not apply any lotions/deodorants the morning of surgery.  Please wear clean clothes to the hospital/surgery center.  FAILURE TO FOLLOW THESE INSTRUCTIONS MAY RESULT IN THE CANCELLATION OF YOUR SURGERY PATIENT SIGNATURE_________________________________  NURSE  SIGNATURE__________________________________  ________________________________________________________________________   Adam Phenix  An incentive spirometer is a tool that can help keep your lungs clear and active. This tool measures how well you are filling your lungs with each breath. Taking long deep breaths may help reverse or decrease the chance of developing breathing (pulmonary) problems (especially infection) following:  A long period of time when you are unable to move or be active. BEFORE THE PROCEDURE   If the spirometer includes an indicator to show your [place] effort, your nurse or respiratory therapist will set it to a desired goal.  If possible, sit up straight or lean slightly forward. Try not to slouch.  Hold the incentive spirometer in an upright position. INSTRUCTIONS FOR USE  1. Sit on the edge of your bed if possible, or sit up as far as you can in bed or on a chair. 2. Hold the incentive spirometer in an upright position. 3. Breathe out normally. 4. Place the mouthpiece in your mouth and seal your lips tightly around it. 5. Breathe in slowly and as deeply as possible, raising the piston or the ball toward the top of the column. 6. Hold your breath for 3-5 seconds or for as long as possible. Allow the piston or ball to fall to the bottom of the column. 7. Remove the mouthpiece from your mouth and breathe out normally. 8. Rest for a few seconds and repeat Steps 1 through 7 at least 10 times every 1-2 hours when you are awake. Take your time and take a few normal breaths between deep breaths. 9. The spirometer may include an indicator to show your [place] effort. Use the indicator as a goal to work toward during each repetition. 10. After each set of 10 deep breaths, practice coughing to be sure your lungs are clear. If you have an incision (the cut made at the time of surgery), support your incision when coughing by placing a pillow or rolled up towels firmly  against it. Once you are able to get out of bed, walk around indoors and cough well. You may stop using the incentive spirometer when instructed by your caregiver.  RISKS AND COMPLICATIONS  Take your time so you do not get dizzy or light-headed.  If you are in pain, you may need to take or ask for pain medication before doing incentive spirometry. It is harder to take a deep breath if you are having pain. AFTER USE  Rest and breathe slowly and easily.  It can be helpful to keep track of a log of your progress. Your caregiver can provide you with a simple table to help with this. If you are using the spirometer at home, follow these instructions: [place] IF:   You are having difficultly using the spirometer.  You have trouble using the spirometer as often as instructed.  Your pain medication is not giving enough relief while using the spirometer.  You develop fever of 100.5 F (38.1 C) or higher. SEEK IMMEDIATE MEDICAL CARE IF:   You cough up bloody sputum that had not been present before.  You develop fever of 102 F (38.9 C)  or greater.  You develop worsening pain at or near the incision site. MAKE SURE YOU:   Understand these instructions.  Will watch your condition.  Will get help right away if you are not doing well or get worse. Document Released: 11/06/2006 Document Revised: 09/18/2011 Document Reviewed: 01/07/2007 ExitCare Patient Information 2014 Exit[employer], [place].   ________________________________________________________________________  WHAT IS A BLOOD TRANSFUSION? Blood Transfusion Information  A transfusion is the replacement of blood or some of its parts. Blood is made up of multiple cells which provide different functions.  Red blood cells carry oxygen and are used for blood loss replacement.  White blood cells fight against infection.  Platelets control bleeding.  Plasma helps clot blood.  Other blood products are available for  specialized needs, such as hemophilia or other clotting disorders. BEFORE THE TRANSFUSION  Who gives blood for transfusions?   Healthy volunteers who are fully evaluated to make sure their blood is safe. This is blood bank blood. Transfusion therapy is the safest it has ever been in the practice of medicine. Before blood is taken from a donor, a complete history is taken to make sure that person has no history of diseases nor engages in risky social behavior (examples are intravenous drug use or sexual activity with multiple partners). The donor's travel history is screened to minimize risk of transmitting infections, such as malaria. The donated blood is tested for signs of infectious diseases, such as HIV and hepatitis. The blood is then tested to be sure it is compatible with you in order to minimize the chance of a transfusion reaction. If you or a relative donates blood, this is often done in anticipation of surgery and is not appropriate for emergency situations. It takes many days to process the donated blood. RISKS AND COMPLICATIONS Although transfusion therapy is very safe and saves many lives, the main dangers of transfusion include:   Getting an infectious disease.  Developing a transfusion reaction. This is an allergic reaction to something in the blood you were given. Every precaution is taken to prevent this. The decision to have a blood transfusion has been considered carefully by your caregiver before blood is given. Blood is not given unless the benefits outweigh the risks. AFTER THE TRANSFUSION  Right after receiving a blood transfusion, you will usually feel much better and more energetic. This is especially true if your red blood cells have gotten low (anemic). The transfusion raises the level of the red blood cells which carry oxygen, and this usually causes an energy increase.  The nurse administering the transfusion will monitor you carefully for complications. HOME CARE  INSTRUCTIONS  No special instructions are needed after a transfusion. You may find your energy is better. Speak with your caregiver about any limitations on activity for underlying diseases you may have. SEEK MEDICAL CARE IF:   Your condition is not improving after your transfusion.  You develop redness or irritation at the intravenous (IV) site. SEEK IMMEDIATE MEDICAL CARE IF:  Any of the following symptoms occur over the next 12 hours:  Shaking chills.  You have a temperature by mouth above 102 F (38.9 C), not controlled by medicine.  Chest, back, or muscle pain.  People around you feel you are not acting correctly or are confused.  Shortness of breath or difficulty breathing.  Dizziness and fainting.  You get a rash or develop hives.  You have a decrease in urine output.  Your urine turns a dark color or changes to pink, red,  or brown. Any of the following symptoms occur over the next 10 days:  You have a temperature by mouth above 102 F (38.9 C), not controlled by medicine.  Shortness of breath.  Weakness after normal activity.  The white part of the eye turns yellow (jaundice).  You have a decrease in the amount of urine or are urinating less often.  Your urine turns a dark color or changes to pink, red, or brown. Document Released: 06/23/2000 Document Revised: 09/18/2011 Document Reviewed: 02/10/2008 [facility] Patient Information 2014 [place], [place].  _______________________________________________________________________

## 2018-08-29 ENCOUNTER — Other Ambulatory Visit: Payer: Self-pay | Admitting: Orthopaedic Surgery

## 2018-09-02 ENCOUNTER — Other Ambulatory Visit: Payer: Self-pay

## 2018-09-02 ENCOUNTER — Encounter (HOSPITAL_COMMUNITY): Payer: Self-pay

## 2018-09-02 ENCOUNTER — Encounter (HOSPITAL_COMMUNITY)
Admission: RE | Admit: 2018-09-02 | Discharge: 2018-09-02 | Disposition: A | Discharge status: Home / Self Care | Payer: Medicare Other | Source: Ambulatory Visit | Attending: Orthopaedic Surgery | Admitting: Orthopaedic Surgery

## 2018-09-02 ENCOUNTER — Ambulatory Visit (HOSPITAL_COMMUNITY)
Admission: RE | Admit: 2018-09-02 | Discharge: 2018-09-02 | Disposition: A | Discharge status: Home / Self Care | Payer: Medicare Other | Source: Ambulatory Visit | Attending: Orthopaedic Surgery | Admitting: Orthopaedic Surgery

## 2018-09-02 DIAGNOSIS — Z01818 Encounter for other preprocedural examination: Secondary | ICD-10-CM | POA: Insufficient documentation

## 2018-09-02 HISTORY — DX: Hypothyroidism, unspecified: E03.9

## 2018-09-02 HISTORY — DX: Unspecified osteoarthritis, unspecified site: M19.90

## 2018-09-02 HISTORY — DX: Nonrheumatic aortic (valve) stenosis: I35.0

## 2018-09-02 LAB — URINALYSIS, ROUTINE W REFLEX MICROSCOPIC
Bilirubin Urine: NEGATIVE
Glucose, UA: NEGATIVE mg/dL
Hgb urine dipstick: NEGATIVE
Ketones, ur: NEGATIVE mg/dL
Nitrite: NEGATIVE
PROTEIN: NEGATIVE mg/dL
Specific Gravity, Urine: 1.011 (ref 1.005–1.030)
pH: 7 (ref 5.0–8.0)

## 2018-09-02 LAB — BASIC METABOLIC PANEL
ANION GAP: 7 (ref 5–15)
BUN: 24 mg/dL — ABNORMAL HIGH (ref 8–23)
CO2: 27 mmol/L (ref 22–32)
Calcium: 8.8 mg/dL — ABNORMAL LOW (ref 8.9–10.3)
Chloride: 105 mmol/L (ref 98–111)
Creatinine, Ser: 0.8 mg/dL (ref 0.44–1.00)
GFR calc Af Amer: >60 mL/min (ref >60)
GFR calc non Af Amer: >60 mL/min (ref >60)
Glucose, Bld: 129 mg/dL — ABNORMAL HIGH (ref 70–99)
Potassium: 4 mmol/L (ref 3.5–5.1)
SODIUM: 139 mmol/L (ref 135–145)

## 2018-09-02 LAB — CBC WITH DIFFERENTIAL/PLATELET
Abs Immature Granulocytes: 0.03 10*3/uL (ref 0.00–0.07)
Basophils Absolute: 0.1 10*3/uL (ref 0.0–0.1)
Basophils Relative: 1 %
Eosinophils Absolute: 0.2 10*3/uL (ref 0.0–0.5)
Eosinophils Relative: 4 %
HCT: 36.7 % (ref 36.0–46.0)
Hemoglobin: 11.7 g/dL — ABNORMAL LOW (ref 12.0–15.0)
Immature Granulocytes: 0 %
Lymphocytes Relative: 17 %
Lymphs Abs: 1.2 10*3/uL (ref 0.7–4.0)
MCH: 28.2 pg (ref 26.0–34.0)
MCHC: 31.9 g/dL (ref 30.0–36.0)
MCV: 88.4 fL (ref 80.0–100.0)
Monocytes Absolute: 0.7 10*3/uL (ref 0.1–1.0)
Monocytes Relative: 10 %
NEUTROS PCT: 68 %
Neutro Abs: 4.7 10*3/uL (ref 1.7–7.7)
PLATELETS: 368 10*3/uL (ref 150–400)
RBC: 4.15 MIL/uL (ref 3.87–5.11)
RDW: 13.2 % (ref 11.5–15.5)
WBC: 6.9 10*3/uL (ref 4.0–10.5)
nRBC: 0 % (ref 0.0–0.2)

## 2018-09-02 LAB — SURGICAL PCR SCREEN
MRSA, PCR: NEGATIVE
Staphylococcus aureus: POSITIVE — AB

## 2018-09-02 LAB — GLUCOSE, CAPILLARY: Glucose-Capillary: 131 mg/dL — ABNORMAL HIGH (ref 70–99)

## 2018-09-02 LAB — ABO/RH: ABO/RH(D): A POS

## 2018-09-02 LAB — PROTIME-INR
INR: 1
Prothrombin Time: 13.2 seconds (ref 11.4–15.2)

## 2018-09-02 LAB — APTT: aPTT: 32 seconds (ref 24–36)

## 2018-09-03 NOTE — Progress Notes (Addendum)
Anesthesia Chart Review:   Case:  [id number] Date/Time:  09/10/18 0954   Procedure:  TOTAL KNEE ARTHROPLASTY (Left )   Anesthesia type:  Spinal   Pre-op diagnosis:  Left Degnerative Joint Disease   Location:  [facility] / [facility]   Surgeon:  Marcene Corning, MD      DISCUSSION:  Pt is a 78 year old female with hx aortic stenosis (moderate by 02/2017 echo; per Dr. Verl Dicker notes, will recheck echo 03/2019), B carotid bruits (per Dr. Verl Dicker notes, will recheck carotids 03/2019), HTN, DM  - Ms. Arn Medal, [place] reviewed pt's 02/22/17 echo with Dr. Bradley Ferris, who felt pt could proceed as scheduled.    VS: BP (!) 154/50 (BP Location: Left Arm)   Pulse 63   Temp 36.7 C (Oral)   Resp 18   Ht 5\' 2"  (1.575 m)   Wt 83.1 kg   BMI 33.51 kg/m    PROVIDERS: - PCP is Emi Belfast, FNP -Cardiologist is Delrae Rend, MD. Last office visit 03/07/18 - note indicates he was aware pt was considering knee replacement.    LABS: Labs reviewed: Acceptable for surgery.  - HbA1c was 6.5 on 07/19/18  (all labs ordered are listed, but only abnormal results are displayed)  Labs Reviewed  SURGICAL PCR SCREEN - Abnormal; Notable for the following components:      Result Value   Staphylococcus aureus POSITIVE (*)    All other components within normal limits  BASIC METABOLIC PANEL - Abnormal; Notable for the following components:   Glucose, Bld 129 (*)    BUN 24 (*)    Calcium 8.8 (*)    All other components within normal limits  CBC WITH DIFFERENTIAL/PLATELET - Abnormal; Notable for the following components:   Hemoglobin 11.7 (*)    All other components within normal limits  URINALYSIS, ROUTINE W REFLEX MICROSCOPIC - Abnormal; Notable for the following components:   Leukocytes,Ua MODERATE (*)    Bacteria, UA RARE (*)    All other components within normal limits  GLUCOSE, CAPILLARY - Abnormal; Notable for the following components:   Glucose-Capillary 131 (*)    All other components within normal  limits  APTT  PROTIME-INR  TYPE AND SCREEN  ABO/RH     IMAGES:  CXR 09/02/18: No active cardiopulmonary disease   EKG 03/07/18 [facility]):  NSR. PAC x1.     CV:  Echo 02/22/17 [facility]):  1. LV cavity normal in size. Normal global wall motion. Calculated EF 60% 2. LA cavity mildly dilated.  3. Probably trileaflet aortic valve with severe calcification. Moderate aortic stenosis. Mean PG 17 mmHg, AVA 1.3 cm2, AVAi 0.65 cm2 4. Mild mitral regurgitation 5. 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  6. Mild tricuspid regurgitation. PASP estimated at 20-25 mmHg 7. Compared with hospital echo dated 04/07/15, no significant change noted.   Carotid duplex 04/07/15:  - Heterogeneous plaque, bilaterally. - 1-39% bilateral ICA stenosis. - Patent vertebral arteries with antegrade flow. - Normal subclavian arteries, bilaterally. - 2 year f/u   Past Medical History:  Diagnosis Date  . Aortic stenosis   . Arthritis   . Diabetes mellitus    type 2  . Heart murmur    aortic stenosis  . Hyperlipidemia   . Hypothyroidism   . IBS (irritable bowel syndrome)   . NSVD (normal spontaneous vaginal delivery)    X 4  . Pain    INTRASCAPULAR PAIN  . SAB (spontaneous abortion)    X  2  . Thyroid disease    HYPOTHYROIDISM    Past Surgical History:  Procedure Laterality Date  . ABDOMINAL HYSTERECTOMY  1977   TAH  (FIBROIDS)  . DILATION AND CURETTAGE OF UTERUS    . DOPPLER ECHOCARDIOGRAPHY  2009   HAD AN ECHOCARDIOGRAM THAT SHOWED MODERATE MITRAL  ANNULAR CALCIFICATION AND A NORMAL EJECTION FRACTION OF 55%  . OTHER SURGICAL HISTORY     HYSTERECTOMY  . SHOULDER SURGERY     RIGHT SHOULDER SURGERY  . THYROIDECTOMY    . 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   RIGHT    MEDICATIONS: . aspirin 81 MG tablet  . atorvastatin (LIPITOR) 40 MG tablet  . Cholecalciferol (VITAMIN D) 50 MCG (2000 UT) tablet  . glucose blood (ACCU-CHEK AVIVA PLUS) test strip   . levothyroxine (SYNTHROID, LEVOTHROID) 100 MCG tablet  . metFORMIN (GLUCOPHAGE-XR) 500 MG 24 hr tablet  . Multiple Vitamins-Minerals (PRESERVISION AREDS 2 PO)  . NITROGLYCERIN PO  . ramipril (ALTACE) 10 MG capsule   No current facility-administered medications for this encounter.     If no changes, I anticipate pt can proceed with surgery as scheduled.   Rica Mast, FNP-BC [facility]/Anesthesiology Phone: [phone] 09/04/2018 7:31 PM

## 2018-09-03 NOTE — Progress Notes (Signed)
PCP: zach hall  CARDIOLOGIST: dr Jacinto Halim  INFO IN Epic:hemaglobin a1c 07-19-18, cbc with dif, cmet, pt, ptt, ua (abnormal) type and screen done 09-02-18  INFO ON CHART:lov and ekg dr Jacinto Halim 03-07-28, echo 02-22-18 on chart Moderate aortic stenosis  BLOOD THINNERS AND LAST DOSES: none ____________________________________  PATIENT SYMPTOMS AT TIME OF PREOP: none

## 2018-09-03 NOTE — Progress Notes (Signed)
ekg 03-07-18 dr Jacinto Halim on chart Echo 02-22-18 dr Jacinto Halim on chart lov dr Jacinto Halim 03-07-18 on chart

## 2018-09-04 NOTE — H&P (Signed)
TOTAL KNEE ADMISSION H&P  Patient is being admitted for left total knee arthroplasty.  Subjective:  Chief Complaint:left knee pain.  HPI: Dana B Oftedahl, 78 y.o. female, has a history of pain and functional disability in the left knee due to arthritis and has failed non-surgical conservative treatments for greater than 12 weeks to includeNSAID's and/or analgesics, corticosteriod injections, flexibility and strengthening excercises, supervised PT with diminished ADL's post treatment, use of assistive devices, weight reduction as appropriate and activity modification.  Onset of symptoms was gradual, starting 5 years ago with gradually worsening course since that time. The patient noted no past surgery on the left knee(s).  Patient currently rates pain in the left knee(s) at 10 out of 10 with activity. Patient has night pain, worsening of pain with activity and weight bearing, pain that interferes with activities of daily living, crepitus and joint swelling.  Patient has evidence of subchondral cysts, subchondral sclerosis, periarticular osteophytes and joint space narrowing by imaging studies. There is no active infection.  Patient Active Problem List   Diagnosis Date Noted  . Acquired hypothyroidism 03/22/2018  . Ovarian cyst, left 07/09/2014  . Weight gain 03/18/2012  . Family history of osteoporosis 03/18/2012  . Aortic stenosis, moderate 03/20/2011  . Dyslipidemia 03/20/2011  . Controlled type 2 diabetes mellitus without complication, without long-term current use of insulin (HCC) 03/20/2011  . Osteopenia of the elderly 03/17/2011   Past Medical History:  Diagnosis Date  . Aortic stenosis   . Arthritis   . Diabetes mellitus    type 2  . Heart murmur    aortic stenosis  . Hyperlipidemia   . Hypothyroidism   . IBS (irritable bowel syndrome)   . NSVD (normal spontaneous vaginal delivery)    X 4  . Pain    INTRASCAPULAR PAIN  . SAB (spontaneous abortion)    X 2  . Thyroid  disease    HYPOTHYROIDISM    Past Surgical History:  Procedure Laterality Date  . ABDOMINAL HYSTERECTOMY  1977   TAH  (FIBROIDS)  . DILATION AND CURETTAGE OF UTERUS    . DOPPLER ECHOCARDIOGRAPHY  2009   HAD AN ECHOCARDIOGRAM THAT SHOWED MODERATE MITRAL  ANNULAR CALCIFICATION AND A NORMAL EJECTION FRACTION OF 55%  . OTHER SURGICAL HISTORY     HYSTERECTOMY  . SHOULDER SURGERY     RIGHT SHOULDER SURGERY  . THYROIDECTOMY    . 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   RIGHT    No current facility-administered medications for this encounter.    Current Outpatient Medications  Medication Sig Dispense Refill Last Dose  . aspirin 81 MG tablet Take 81 mg by mouth every other day.    Taking  . atorvastatin (LIPITOR) 40 MG tablet Take 1 tablet (40 mg total) by mouth daily at 6 PM. 90 tablet 3 Taking  . Cholecalciferol (VITAMIN D) 50 MCG (2000 UT) tablet Take 2,000 Units by mouth every other day.   Taking  . levothyroxine (SYNTHROID, LEVOTHROID) 100 MCG tablet Take 1 tablet (100 mcg total) by mouth daily. 90 tablet 3 Taking  . metFORMIN (GLUCOPHAGE-XR) 500 MG 24 hr tablet Take 1 tablet (500 mg total) by mouth daily with breakfast. 90 tablet 3 Taking  . Multiple Vitamins-Minerals (PRESERVISION AREDS 2 PO) Take 1 tablet by mouth 2 (two) times daily.    Taking  . NITROGLYCERIN PO Take 0.4 mg by mouth every 5 (five) minutes as needed (chest pain).    Taking  . ramipril (ALTACE) 10 MG capsule Take  1 capsule (10 mg total) by mouth daily. 90 capsule 3 Taking  . glucose blood (ACCU-CHEK AVIVA PLUS) test strip Use as instructed 100 each 12 Taking   Allergies  Allergen Reactions  . Codeine Itching    After 3 days  . Dilaudid [Hydromorphone Hcl] Itching    After 3 days    Social History   Tobacco Use  . Smoking status: Never Smoker  . Smokeless tobacco: Never Used  Substance Use Topics  . Alcohol use: No    Alcohol/week: 0.0 standard drinks    Family History  Problem Relation Age of Onset  .  Osteoporosis Mother   . Heart disease Mother   . Heart disease Father   . Cancer Father        BRAIN TUMOR  . Diabetes Maternal Aunt   . Diabetes Maternal Uncle   . Cancer Brother        BRAIN TUMOR     Review of Systems  Musculoskeletal: Positive for joint pain.       Left knee  All other systems reviewed and are negative.   Objective:  Physical Exam  Constitutional: She is oriented to person, place, and time. She appears well-developed and well-nourished.  HENT:  Head: Normocephalic and atraumatic.  Eyes: Pupils are equal, round, and reactive to light.  Neck: Normal range of motion.  Cardiovascular: Normal rate and regular rhythm.  Respiratory: Effort normal.  GI: Soft.  Musculoskeletal:     Comments: Left knee motion is about 5-95 and she has medial joint line pain and crepitation.  I do not feel an effusion.  Her skin is benign.  Respirations are normal and unlabored.  She has intact sensation and motor function in her feet with palpable pulses on both sides.    Neurological: She is alert and oriented to person, place, and time.  Skin: Skin is warm and dry.  Psychiatric: She has a normal mood and affect. Her behavior is normal. Judgment and thought content normal.    Vital signs in last 24 hours:    Labs:   Estimated body mass index is 33.51 kg/m as calculated from the following:   Height as of 09/02/18: 5\' 2"  (1.575 m).   Weight as of 09/02/18: 83.1 kg.   Imaging Review Plain radiographs demonstrate severe degenerative joint disease of the left knee(s). The overall alignment isneutral. The bone quality appears to be good for age and reported activity level.      Assessment/Plan:  End stage primary arthritis, left knee   The patient history, physical examination, clinical judgment of the provider and imaging studies are consistent with end stage degenerative joint disease of the left knee(s) and total knee arthroplasty is deemed medically necessary. The  treatment options including medical management, injection therapy arthroscopy and arthroplasty were discussed at length. The risks and benefits of total knee arthroplasty were presented and reviewed. The risks due to aseptic loosening, infection, stiffness, patella tracking problems, thromboembolic complications and other imponderables were discussed. The patient acknowledged the explanation, agreed to proceed with the plan and consent was signed. Patient is being admitted for inpatient treatment for surgery, pain control, PT, OT, prophylactic antibiotics, VTE prophylaxis, progressive ambulation and ADL's and discharge planning. The patient is planning to be discharged home with home health services  Patient's anticipated LOS is less than 2 midnights, meeting these requirements: - Younger than 49 - Lives within 1 hour of care - Has a competent adult at home to recover with post-op recover -  NO history of  - Chronic pain requiring opiods  - Diabetes  - Coronary Artery Disease  - Heart failure  - Heart attack  - Stroke  - DVT/VTE  - Cardiac arrhythmia  - Respiratory Failure/COPD  - Renal failure  - Anemia  - Advanced Liver disease

## 2018-09-06 ENCOUNTER — Telehealth: Payer: Self-pay

## 2018-09-09 MED ORDER — BUPIVACAINE LIPOSOME 1.3 % IJ SUSP
20.0000 mL | Freq: Once | INTRAMUSCULAR | Status: DC
  Filled 2018-09-09: qty 20

## 2018-09-09 MED ORDER — TRANEXAMIC ACID 1000 MG/10ML IV SOLN
2000.0000 mg | INTRAVENOUS | Status: DC
  Filled 2018-09-09: qty 20

## 2018-09-10 ENCOUNTER — Observation Stay (HOSPITAL_COMMUNITY)
Admission: RE | Admit: 2018-09-10 | Discharge: 2018-09-11 | Disposition: A | Discharge status: Home / Self Care | Payer: Medicare Other | Source: Ambulatory Visit | Attending: Orthopaedic Surgery | Admitting: Orthopaedic Surgery

## 2018-09-10 ENCOUNTER — Ambulatory Visit (HOSPITAL_COMMUNITY): Payer: Medicare Other | Admitting: Emergency Medicine

## 2018-09-10 ENCOUNTER — Encounter (HOSPITAL_COMMUNITY): Payer: Self-pay

## 2018-09-10 ENCOUNTER — Encounter (HOSPITAL_COMMUNITY)
Admission: RE | Disposition: A | Discharge status: Home / Self Care | Payer: Self-pay | Source: Ambulatory Visit | Attending: Orthopaedic Surgery

## 2018-09-10 ENCOUNTER — Other Ambulatory Visit: Payer: Self-pay

## 2018-09-10 ENCOUNTER — Ambulatory Visit (HOSPITAL_COMMUNITY): Payer: Medicare Other | Admitting: Anesthesiology

## 2018-09-10 DIAGNOSIS — Z8262 Family history of osteoporosis: Secondary | ICD-10-CM | POA: Insufficient documentation

## 2018-09-10 DIAGNOSIS — I350 Nonrheumatic aortic (valve) stenosis: Secondary | ICD-10-CM | POA: Insufficient documentation

## 2018-09-10 DIAGNOSIS — M1712 Unilateral primary osteoarthritis, left knee: Principal | ICD-10-CM | POA: Diagnosis present

## 2018-09-10 DIAGNOSIS — Z7982 Long term (current) use of aspirin: Secondary | ICD-10-CM | POA: Insufficient documentation

## 2018-09-10 DIAGNOSIS — K589 Irritable bowel syndrome without diarrhea: Secondary | ICD-10-CM | POA: Diagnosis not present

## 2018-09-10 DIAGNOSIS — E119 Type 2 diabetes mellitus without complications: Secondary | ICD-10-CM | POA: Insufficient documentation

## 2018-09-10 DIAGNOSIS — Z833 Family history of diabetes mellitus: Secondary | ICD-10-CM | POA: Insufficient documentation

## 2018-09-10 DIAGNOSIS — Z79890 Hormone replacement therapy (postmenopausal): Secondary | ICD-10-CM | POA: Insufficient documentation

## 2018-09-10 DIAGNOSIS — Z885 Allergy status to narcotic agent status: Secondary | ICD-10-CM | POA: Diagnosis not present

## 2018-09-10 DIAGNOSIS — Z7984 Long term (current) use of oral hypoglycemic drugs: Secondary | ICD-10-CM | POA: Diagnosis not present

## 2018-09-10 DIAGNOSIS — M6281 Muscle weakness (generalized): Secondary | ICD-10-CM | POA: Insufficient documentation

## 2018-09-10 DIAGNOSIS — M8580 Other specified disorders of bone density and structure, unspecified site: Secondary | ICD-10-CM | POA: Insufficient documentation

## 2018-09-10 DIAGNOSIS — E785 Hyperlipidemia, unspecified: Secondary | ICD-10-CM | POA: Insufficient documentation

## 2018-09-10 DIAGNOSIS — E039 Hypothyroidism, unspecified: Secondary | ICD-10-CM | POA: Diagnosis not present

## 2018-09-10 DIAGNOSIS — R2689 Other abnormalities of gait and mobility: Secondary | ICD-10-CM | POA: Insufficient documentation

## 2018-09-10 DIAGNOSIS — Z96651 Presence of right artificial knee joint: Secondary | ICD-10-CM | POA: Diagnosis not present

## 2018-09-10 DIAGNOSIS — Z79899 Other long term (current) drug therapy: Secondary | ICD-10-CM | POA: Diagnosis not present

## 2018-09-10 HISTORY — PX: TOTAL KNEE ARTHROPLASTY: SHX125

## 2018-09-10 LAB — TYPE AND SCREEN
ABO/RH(D): A POS
Antibody Screen: NEGATIVE

## 2018-09-10 LAB — GLUCOSE, CAPILLARY
Glucose-Capillary: 145 mg/dL — ABNORMAL HIGH (ref 70–99)
Glucose-Capillary: 157 mg/dL — ABNORMAL HIGH (ref 70–99)
Glucose-Capillary: 201 mg/dL — ABNORMAL HIGH (ref 70–99)
Glucose-Capillary: 279 mg/dL — ABNORMAL HIGH (ref 70–99)

## 2018-09-10 SURGERY — ARTHROPLASTY, KNEE, TOTAL
Anesthesia: General | Laterality: Left

## 2018-09-10 MED ORDER — ASPIRIN EC 325 MG PO TBEC
325.0000 mg | DELAYED_RELEASE_TABLET | Freq: Two times a day (BID) | ORAL | Status: DC
  Administered 2018-09-11: 325 mg via ORAL
  Filled 2018-09-10: qty 1

## 2018-09-10 MED ORDER — SODIUM CHLORIDE (PF) 0.9 % IJ SOLN
INTRAMUSCULAR | Status: AC
  Filled 2018-09-10: qty 50

## 2018-09-10 MED ORDER — TRANEXAMIC ACID-NACL 1000-0.7 MG/100ML-% IV SOLN
1000.0000 mg | INTRAVENOUS | Status: AC
  Administered 2018-09-10: 1000 mg via INTRAVENOUS
  Filled 2018-09-10: qty 100

## 2018-09-10 MED ORDER — SODIUM CHLORIDE 0.9 % IR SOLN
Status: DC | PRN
  Administered 2018-09-10: 1000 mL

## 2018-09-10 MED ORDER — ATORVASTATIN CALCIUM 40 MG PO TABS
40.0000 mg | ORAL_TABLET | Freq: Every day | ORAL | Status: DC
  Administered 2018-09-10: 40 mg via ORAL
  Filled 2018-09-10: qty 1

## 2018-09-10 MED ORDER — METFORMIN HCL ER 500 MG PO TB24
500.0000 mg | ORAL_TABLET | Freq: Every day | ORAL | Status: DC
  Administered 2018-09-11: 500 mg via ORAL
  Filled 2018-09-10: qty 1

## 2018-09-10 MED ORDER — CEFAZOLIN SODIUM-DEXTROSE 2-4 GM/100ML-% IV SOLN
2.0000 g | Freq: Four times a day (QID) | INTRAVENOUS | Status: AC
  Administered 2018-09-10 (×2): 2 g via INTRAVENOUS
  Filled 2018-09-10 (×2): qty 100

## 2018-09-10 MED ORDER — RAMIPRIL 10 MG PO CAPS
10.0000 mg | ORAL_CAPSULE | Freq: Every day | ORAL | Status: DC
  Administered 2018-09-10 – 2018-09-11 (×2): 10 mg via ORAL
  Filled 2018-09-10 (×2): qty 1

## 2018-09-10 MED ORDER — ACETAMINOPHEN 325 MG PO TABS: 325.0000 mg | ORAL_TABLET | Freq: Four times a day (QID) | ORAL | Status: DC | PRN

## 2018-09-10 MED ORDER — ALUM & MAG HYDROXIDE-SIMETH 200-200-20 MG/5ML PO SUSP: 30.0000 mL | ORAL | Status: DC | PRN

## 2018-09-10 MED ORDER — PHENYLEPHRINE 40 MCG/ML (10ML) SYRINGE FOR IV PUSH (FOR BLOOD PRESSURE SUPPORT)
PREFILLED_SYRINGE | INTRAVENOUS | Status: AC
  Filled 2018-09-10: qty 10

## 2018-09-10 MED ORDER — SUGAMMADEX SODIUM 200 MG/2ML IV SOLN
INTRAVENOUS | Status: DC | PRN
  Administered 2018-09-10: 200 mg via INTRAVENOUS

## 2018-09-10 MED ORDER — FENTANYL CITRATE (PF) 100 MCG/2ML IJ SOLN
25.0000 ug | INTRAMUSCULAR | Status: DC | PRN
  Administered 2018-09-10 (×3): 50 ug via INTRAVENOUS

## 2018-09-10 MED ORDER — DIPHENHYDRAMINE HCL 12.5 MG/5ML PO ELIX: 12.5000 mg | ORAL_SOLUTION | ORAL | Status: DC | PRN

## 2018-09-10 MED ORDER — KETOROLAC TROMETHAMINE 15 MG/ML IJ SOLN
7.5000 mg | Freq: Four times a day (QID) | INTRAMUSCULAR | Status: DC
  Administered 2018-09-10 – 2018-09-11 (×3): 7.5 mg via INTRAVENOUS
  Filled 2018-09-10 (×3): qty 1

## 2018-09-10 MED ORDER — METHOCARBAMOL 500 MG PO TABS
500.0000 mg | ORAL_TABLET | Freq: Four times a day (QID) | ORAL | Status: DC | PRN
  Administered 2018-09-10: 500 mg via ORAL
  Filled 2018-09-10: qty 1

## 2018-09-10 MED ORDER — ROPIVACAINE HCL 7.5 MG/ML IJ SOLN
INTRAMUSCULAR | Status: DC | PRN
  Administered 2018-09-10: 20 mL via PERINEURAL

## 2018-09-10 MED ORDER — ONDANSETRON HCL 4 MG/2ML IJ SOLN
INTRAMUSCULAR | Status: DC | PRN
  Administered 2018-09-10: 4 mg via INTRAVENOUS

## 2018-09-10 MED ORDER — HYDROCODONE-ACETAMINOPHEN 5-325 MG PO TABS
1.0000 | ORAL_TABLET | ORAL | Status: DC | PRN
  Administered 2018-09-10 (×2): 1 via ORAL
  Filled 2018-09-10: qty 1
  Filled 2018-09-10: qty 2
  Filled 2018-09-10: qty 1

## 2018-09-10 MED ORDER — FENTANYL CITRATE (PF) 100 MCG/2ML IJ SOLN
INTRAMUSCULAR | Status: AC
  Filled 2018-09-10: qty 2

## 2018-09-10 MED ORDER — ONDANSETRON HCL 4 MG PO TABS: 4.0000 mg | ORAL_TABLET | Freq: Four times a day (QID) | ORAL | Status: DC | PRN

## 2018-09-10 MED ORDER — PROPOFOL 10 MG/ML IV BOLUS
INTRAVENOUS | Status: AC
  Filled 2018-09-10: qty 40

## 2018-09-10 MED ORDER — METOPROLOL TARTRATE 5 MG/5ML IV SOLN
INTRAVENOUS | Status: DC | PRN
  Administered 2018-09-10 (×2): 1 mg via INTRAVENOUS

## 2018-09-10 MED ORDER — MIDAZOLAM HCL 2 MG/2ML IJ SOLN
INTRAMUSCULAR | Status: AC
  Filled 2018-09-10: qty 2

## 2018-09-10 MED ORDER — DEXAMETHASONE SODIUM PHOSPHATE 10 MG/ML IJ SOLN
INTRAMUSCULAR | Status: DC | PRN
  Administered 2018-09-10: 4 mg via INTRAVENOUS

## 2018-09-10 MED ORDER — TRANEXAMIC ACID 1000 MG/10ML IV SOLN
INTRAVENOUS | Status: DC | PRN
  Administered 2018-09-10: 2000 mg via TOPICAL

## 2018-09-10 MED ORDER — LACTATED RINGERS IV SOLN
INTRAVENOUS | Status: DC
  Administered 2018-09-10 (×2): via INTRAVENOUS

## 2018-09-10 MED ORDER — MENTHOL 3 MG MT LOZG: 1.0000 | LOZENGE | OROMUCOSAL | Status: DC | PRN

## 2018-09-10 MED ORDER — LIDOCAINE 2% (20 MG/ML) 5 ML SYRINGE
INTRAMUSCULAR | Status: DC | PRN
  Administered 2018-09-10: 60 mg via INTRAVENOUS

## 2018-09-10 MED ORDER — TRANEXAMIC ACID-NACL 1000-0.7 MG/100ML-% IV SOLN
1000.0000 mg | Freq: Once | INTRAVENOUS | Status: AC
  Administered 2018-09-10: 1000 mg via INTRAVENOUS
  Filled 2018-09-10: qty 100

## 2018-09-10 MED ORDER — ONDANSETRON HCL 4 MG/2ML IJ SOLN
INTRAMUSCULAR | Status: AC
  Filled 2018-09-10: qty 2

## 2018-09-10 MED ORDER — BUPIVACAINE-EPINEPHRINE (PF) 0.5% -1:200000 IJ SOLN
INTRAMUSCULAR | Status: DC | PRN
  Administered 2018-09-10: 30 mL

## 2018-09-10 MED ORDER — BUPIVACAINE LIPOSOME 1.3 % IJ SUSP
INTRAMUSCULAR | Status: DC | PRN
  Administered 2018-09-10: 20 mL

## 2018-09-10 MED ORDER — SODIUM CHLORIDE 0.9 % IJ SOLN
INTRAMUSCULAR | Status: DC | PRN
  Administered 2018-09-10: 30 mL

## 2018-09-10 MED ORDER — METOCLOPRAMIDE HCL 5 MG/ML IJ SOLN: 5.0000 mg | Freq: Three times a day (TID) | INTRAMUSCULAR | Status: DC | PRN

## 2018-09-10 MED ORDER — METOCLOPRAMIDE HCL 5 MG/ML IJ SOLN: 10.0000 mg | Freq: Once | INTRAMUSCULAR | Status: DC | PRN

## 2018-09-10 MED ORDER — OXYCODONE HCL 5 MG PO TABS
5.0000 mg | ORAL_TABLET | Freq: Once | ORAL | Status: AC | PRN
  Administered 2018-09-10: 5 mg via ORAL

## 2018-09-10 MED ORDER — MORPHINE SULFATE (PF) 2 MG/ML IV SOLN: 0.5000 mg | INTRAVENOUS | Status: DC | PRN

## 2018-09-10 MED ORDER — METOCLOPRAMIDE HCL 5 MG PO TABS: 5.0000 mg | ORAL_TABLET | Freq: Three times a day (TID) | ORAL | Status: DC | PRN

## 2018-09-10 MED ORDER — PROPOFOL 10 MG/ML IV BOLUS
INTRAVENOUS | Status: AC
  Filled 2018-09-10: qty 20

## 2018-09-10 MED ORDER — FENTANYL CITRATE (PF) 100 MCG/2ML IJ SOLN
50.0000 ug | INTRAMUSCULAR | Status: DC
  Administered 2018-09-10: 100 ug via INTRAVENOUS
  Filled 2018-09-10: qty 2

## 2018-09-10 MED ORDER — FENTANYL CITRATE (PF) 250 MCG/5ML IJ SOLN
INTRAMUSCULAR | Status: DC | PRN
  Administered 2018-09-10 (×4): 50 ug via INTRAVENOUS

## 2018-09-10 MED ORDER — FENTANYL CITRATE (PF) 100 MCG/2ML IJ SOLN
INTRAMUSCULAR | Status: AC
  Administered 2018-09-10: 50 ug via INTRAVENOUS
  Filled 2018-09-10: qty 4

## 2018-09-10 MED ORDER — ACETAMINOPHEN 500 MG PO TABS
500.0000 mg | ORAL_TABLET | Freq: Four times a day (QID) | ORAL | Status: DC
  Filled 2018-09-10: qty 1

## 2018-09-10 MED ORDER — ACETAMINOPHEN 10 MG/ML IV SOLN
INTRAVENOUS | Status: AC
  Filled 2018-09-10: qty 100

## 2018-09-10 MED ORDER — PHENOL 1.4 % MT LIQD
1.0000 | OROMUCOSAL | Status: DC | PRN
  Filled 2018-09-10: qty 177

## 2018-09-10 MED ORDER — LACTATED RINGERS IV SOLN: INTRAVENOUS | Status: DC

## 2018-09-10 MED ORDER — CEFAZOLIN SODIUM-DEXTROSE 2-4 GM/100ML-% IV SOLN
2.0000 g | INTRAVENOUS | Status: AC
  Administered 2018-09-10: 2 g via INTRAVENOUS
  Filled 2018-09-10: qty 100

## 2018-09-10 MED ORDER — ROCURONIUM BROMIDE 10 MG/ML (PF) SYRINGE
PREFILLED_SYRINGE | INTRAVENOUS | Status: DC | PRN
  Administered 2018-09-10: 80 mg via INTRAVENOUS

## 2018-09-10 MED ORDER — LACTATED RINGERS IV SOLN
INTRAVENOUS | Status: DC
  Administered 2018-09-10: 08:00:00 via INTRAVENOUS

## 2018-09-10 MED ORDER — METHOCARBAMOL 500 MG IVPB - SIMPLE MED
500.0000 mg | Freq: Four times a day (QID) | INTRAVENOUS | Status: DC | PRN
  Administered 2018-09-10: 500 mg via INTRAVENOUS
  Filled 2018-09-10: qty 50

## 2018-09-10 MED ORDER — METOPROLOL TARTRATE 5 MG/5ML IV SOLN
INTRAVENOUS | Status: AC
  Filled 2018-09-10: qty 5

## 2018-09-10 MED ORDER — PHENYLEPHRINE 40 MCG/ML (10ML) SYRINGE FOR IV PUSH (FOR BLOOD PRESSURE SUPPORT)
PREFILLED_SYRINGE | INTRAVENOUS | Status: DC | PRN
  Administered 2018-09-10: 40 ug via INTRAVENOUS

## 2018-09-10 MED ORDER — DEXAMETHASONE SODIUM PHOSPHATE 10 MG/ML IJ SOLN
INTRAMUSCULAR | Status: AC
  Filled 2018-09-10: qty 1

## 2018-09-10 MED ORDER — DOCUSATE SODIUM 100 MG PO CAPS
100.0000 mg | ORAL_CAPSULE | Freq: Two times a day (BID) | ORAL | Status: DC
  Administered 2018-09-10 – 2018-09-11 (×2): 100 mg via ORAL
  Filled 2018-09-10 (×2): qty 1

## 2018-09-10 MED ORDER — PROPOFOL 10 MG/ML IV BOLUS
INTRAVENOUS | Status: DC | PRN
  Administered 2018-09-10: 10 mg via INTRAVENOUS
  Administered 2018-09-10: 90 mg via INTRAVENOUS

## 2018-09-10 MED ORDER — INSULIN ASPART 100 UNIT/ML ~~LOC~~ SOLN
0.0000 [IU] | Freq: Three times a day (TID) | SUBCUTANEOUS | Status: DC
  Administered 2018-09-11: 3 [IU] via SUBCUTANEOUS

## 2018-09-10 MED ORDER — CHLORHEXIDINE GLUCONATE 4 % EX LIQD: 60.0000 mL | Freq: Once | CUTANEOUS | Status: DC

## 2018-09-10 MED ORDER — MIDAZOLAM HCL 2 MG/2ML IJ SOLN
1.0000 mg | INTRAMUSCULAR | Status: DC
  Administered 2018-09-10: 2 mg via INTRAVENOUS
  Filled 2018-09-10: qty 2

## 2018-09-10 MED ORDER — SODIUM CHLORIDE 0.9 % IR SOLN
Status: DC | PRN
  Administered 2018-09-10: 3000 mL

## 2018-09-10 MED ORDER — STERILE WATER FOR IRRIGATION IR SOLN
Status: DC | PRN
  Administered 2018-09-10: 2000 mL

## 2018-09-10 MED ORDER — MEPERIDINE HCL 50 MG/ML IJ SOLN: 6.2500 mg | INTRAMUSCULAR | Status: DC | PRN

## 2018-09-10 MED ORDER — ONDANSETRON HCL 4 MG/2ML IJ SOLN: 4.0000 mg | Freq: Four times a day (QID) | INTRAMUSCULAR | Status: DC | PRN

## 2018-09-10 MED ORDER — FENTANYL CITRATE (PF) 100 MCG/2ML IJ SOLN
50.0000 ug | Freq: Once | INTRAMUSCULAR | Status: AC | PRN
  Administered 2018-09-10: 50 ug via INTRAVENOUS

## 2018-09-10 MED ORDER — LIDOCAINE 2% (20 MG/ML) 5 ML SYRINGE
INTRAMUSCULAR | Status: AC
  Filled 2018-09-10: qty 5

## 2018-09-10 MED ORDER — BISACODYL 5 MG PO TBEC: 5.0000 mg | DELAYED_RELEASE_TABLET | Freq: Every day | ORAL | Status: DC | PRN

## 2018-09-10 MED ORDER — ACETAMINOPHEN 10 MG/ML IV SOLN
1000.0000 mg | Freq: Once | INTRAVENOUS | Status: AC
  Administered 2018-09-10: 1000 mg via INTRAVENOUS

## 2018-09-10 MED ORDER — LEVOTHYROXINE SODIUM 100 MCG PO TABS
100.0000 ug | ORAL_TABLET | Freq: Every day | ORAL | Status: DC
  Administered 2018-09-11: 100 ug via ORAL
  Filled 2018-09-10: qty 1

## 2018-09-10 MED ORDER — OXYCODONE HCL 5 MG PO TABS
ORAL_TABLET | ORAL | Status: AC
  Filled 2018-09-10: qty 1

## 2018-09-10 MED ORDER — METHOCARBAMOL 500 MG IVPB - SIMPLE MED
INTRAVENOUS | Status: AC
  Filled 2018-09-10: qty 50

## 2018-09-10 SURGICAL SUPPLY — 52 items
BAG DECANTER FOR FLEXI CONT (MISCELLANEOUS) ×3 IMPLANT
BAG SPEC THK2 15X12 ZIP CLS (MISCELLANEOUS) ×1
BAG ZIPLOCK 12X15 (MISCELLANEOUS) ×3 IMPLANT
BANDAGE ACE 6X5 VEL STRL LF (GAUZE/BANDAGES/DRESSINGS) ×3 IMPLANT
BANDAGE ELASTIC 6 VELCRO ST LF (GAUZE/BANDAGES/DRESSINGS) ×2 IMPLANT
BLADE SAGITTAL 25.0X1.19X90 (BLADE) ×2 IMPLANT
BLADE SAGITTAL 25.0X1.19X90MM (BLADE) ×1
BLADE SAW SGTL 13.0X1.19X90.0M (BLADE) ×3 IMPLANT
BOWL SMART MIX CTS (DISPOSABLE) ×3 IMPLANT
CEMENT HV SMART SET (Cement) ×6 IMPLANT
CHLORAPREP W/TINT 26ML (MISCELLANEOUS) ×6 IMPLANT
COMP FEM CEM STD LT LCS (Orthopedic Implant) ×3 IMPLANT
COMPONENT FEM CEM STD LT LCS (Orthopedic Implant) IMPLANT
COVER SURGICAL LIGHT HANDLE (MISCELLANEOUS) ×3 IMPLANT
COVER WAND RF STERILE (DRAPES) IMPLANT
CUFF TOURN SGL QUICK 34 (TOURNIQUET CUFF) ×3
CUFF TRNQT CYL 34X4.125X (TOURNIQUET CUFF) ×1 IMPLANT
DECANTER SPIKE VIAL GLASS SM (MISCELLANEOUS) ×6 IMPLANT
DRAPE SHEET LG 3/4 BI-LAMINATE (DRAPES) ×3 IMPLANT
DRAPE U-SHAPE 47X51 STRL (DRAPES) ×3 IMPLANT
DRSG AQUACEL AG ADV 3.5X10 (GAUZE/BANDAGES/DRESSINGS) ×3 IMPLANT
ELECT REM PT RETURN 15FT ADLT (MISCELLANEOUS) ×3 IMPLANT
GLOVE BIO SURGEON STRL SZ8 (GLOVE) ×6 IMPLANT
GLOVE BIOGEL PI IND STRL 8 (GLOVE) ×2 IMPLANT
GLOVE BIOGEL PI INDICATOR 8 (GLOVE) ×4
GOWN STRL REUS W/TWL XL LVL3 (GOWN DISPOSABLE) ×6 IMPLANT
HANDPIECE INTERPULSE COAX TIP (DISPOSABLE) ×3
HOLDER FOLEY CATH W/STRAP (MISCELLANEOUS) IMPLANT
HOOD PEEL AWAY FLYTE STAYCOOL (MISCELLANEOUS) ×9 IMPLANT
INSERT TIB LCS RP STD 12.5 (Knees) ×2 IMPLANT
MANIFOLD NEPTUNE II (INSTRUMENTS) ×3 IMPLANT
NS IRRIG 1000ML POUR BTL (IV SOLUTION) ×3 IMPLANT
PACK TOTAL KNEE CUSTOM (KITS) ×3 IMPLANT
PAD ARMBOARD 7.5X6 YLW CONV (MISCELLANEOUS) ×3 IMPLANT
PATELLA DOME PFC 38MM (Knees) ×2 IMPLANT
PIN STEINMAN FIXATION KNEE (PIN) ×2 IMPLANT
PROTECTOR NERVE ULNAR (MISCELLANEOUS) ×3 IMPLANT
SET HNDPC FAN SPRY TIP SCT (DISPOSABLE) ×1 IMPLANT
SUT STRATAFIX 0 PDS 27 VIOLET (SUTURE)
SUT VIC AB 0 CT1 36 (SUTURE) ×3 IMPLANT
SUT VIC AB 2-0 CT1 27 (SUTURE) ×3
SUT VIC AB 2-0 CT1 TAPERPNT 27 (SUTURE) ×1 IMPLANT
SUT VIC AB 3-0 CT1 27 (SUTURE) ×3
SUT VIC AB 3-0 CT1 TAPERPNT 27 (SUTURE) ×1 IMPLANT
SUT VIC AB 3-0 SH 27 (SUTURE) ×3
SUT VIC AB 3-0 SH 27X BRD (SUTURE) IMPLANT
SUT VLOC 180 0 24IN GS25 (SUTURE) ×2 IMPLANT
SUTURE STRATFX 0 PDS 27 VIOLET (SUTURE) ×1 IMPLANT
TRAY FOLEY MTR SLVR 16FR STAT (SET/KITS/TRAYS/PACK) IMPLANT
TRAY REVISION SZ 3 (Knees) ×2 IMPLANT
WATER STERILE IRR 1000ML POUR (IV SOLUTION) ×5 IMPLANT
WRAP KNEE MAXI GEL POST OP (GAUZE/BANDAGES/DRESSINGS) ×3 IMPLANT

## 2018-09-10 NOTE — Progress Notes (Signed)
AssistedDr. Carignan with left, ultrasound guided, adductor canal block. Side rails up, monitors on throughout procedure. See vital signs in flow sheet. Tolerated Procedure well.  

## 2018-09-10 NOTE — Interval H&P Note (Signed)
History and Physical Interval Note:  09/10/2018 9:05 AM  Dana Sandoval  has presented today for surgery, with the diagnosis of Left Degnerative Joint Disease  The various methods of treatment have been discussed with the patient and family. After consideration of risks, benefits and other options for treatment, the patient has consented to  Procedure(s): TOTAL KNEE ARTHROPLASTY (Left) as a surgical intervention .  The patient's history has been reviewed, patient examined, no change in status, stable for surgery.  I have reviewed the patient's chart and labs.  Questions were answered to the patient's satisfaction.     Velna Ochs

## 2018-09-10 NOTE — Transfer of Care (Signed)
Immediate Anesthesia Transfer of Care Note  Patient: Dana Sandoval  Procedure(s) Performed: TOTAL KNEE ARTHROPLASTY (Left )  Patient Location: PACU  Anesthesia Type:General  Level of Consciousness: awake, alert  and patient cooperative  Airway & Oxygen Therapy: Patient Spontanous Breathing and Patient connected to face mask oxygen  Post-op Assessment: Report given to RN and Post -op Vital signs reviewed and stable  Post vital signs: Reviewed and stable  Last Vitals:  Vitals Value Taken Time  BP 142/116 09/10/2018 12:15 PM  Temp    Pulse 77 09/10/2018 12:16 PM  Resp 13 09/10/2018 12:16 PM  SpO2 100 % 09/10/2018 12:16 PM  Vitals shown include unvalidated device data.  Last Pain:  Vitals:   09/10/18 0951  TempSrc:   PainSc: 0-No pain         Complications: No apparent anesthesia complications

## 2018-09-10 NOTE — Anesthesia Procedure Notes (Signed)
Procedure Name: Intubation Date/Time: 09/10/2018 10:10 AM Performed by: Eben Burow, CRNA Pre-anesthesia Checklist: Patient identified, Emergency Drugs available, Suction available, Patient being monitored and Timeout performed Patient Re-evaluated:Patient Re-evaluated prior to induction Oxygen Delivery Method: Circle system utilized Preoxygenation: Pre-oxygenation with 100% oxygen Induction Type: IV induction Ventilation: Mask ventilation without difficulty Laryngoscope Size: Mac and 4 Grade View: Grade I Tube type: Oral Number of attempts: 1 Airway Equipment and Method: Stylet Placement Confirmation: ETT inserted through vocal cords under direct vision,  positive ETCO2 and breath sounds checked- equal and bilateral Secured at: 19 cm Tube secured with: Tape Dental Injury: Teeth and Oropharynx as per pre-operative assessment

## 2018-09-10 NOTE — Evaluation (Signed)
Physical Therapy Evaluation Patient Details Name: Dana Sandoval MRN: [id number] DOB: [birth year] Today's Date: 09/10/2018   History of Present Illness  78 yo female s/p L TKA 09/10/18. Hx of R TKA  Clinical Impression  On eval POD 0, pt required Min-Mod assist for mobility. She walked ~50 feet with a RW. Moderate pain with activity. Will follow and progress activity as tolerated. Per chart, d/c plan is for home with HHPT f/u.     Follow Up Recommendations Follow surgeon's recommendation for DC plan and follow-up therapies    Equipment Recommendations  None recommended by PT    Recommendations for Other Services       Precautions / Restrictions Precautions Precautions: Fall;Knee Restrictions Weight Bearing Restrictions: No Other Position/Activity Restrictions: WBAT      Mobility  Bed Mobility               General bed mobility comments: sitting EOB at start of session  Transfers Overall transfer level: Needs assistance Equipment used: Rolling walker (2 wheeled) Transfers: Sit to/from Stand Sit to Stand: Mod assist         General transfer comment: VCS safety, technique, hand/LE placement. Assist to rise, stabilize, control descent.   Ambulation/Gait Ambulation/Gait assistance: Min assist Gait Distance (Feet): 50 Feet Assistive device: Rolling walker (2 wheeled) Gait Pattern/deviations: Step-to pattern     General Gait Details: Assist to stabilize throughout distance. Slow gait speed. VCs safety, sequence.   Stairs            Wheelchair Mobility    Modified Rankin (Stroke Patients Only)       Balance Overall balance assessment: Mild deficits observed, not formally tested                                           Pertinent Vitals/Pain Pain Assessment: 0-10 Pain Score: 7  Pain Location: L knee Pain Descriptors / Indicators: Aching;Sore Pain Intervention(s): Monitored during session;Repositioned;Ice applied    Home  Living Family/patient expects to be discharged to:: Private residence Living Arrangements: Spouse/significant other Available Help at Discharge: Family Type of Home: House Home Access: Ramped entrance     Home Layout: Able to live on main level with bedroom/bathroom Home Equipment: Walker - 2 wheels;Cane - single point;Crutches      Prior Function Level of Independence: Independent               Hand Dominance        Extremity/Trunk Assessment   Upper Extremity Assessment Upper Extremity Assessment: Generalized weakness    Lower Extremity Assessment Lower Extremity Assessment: Generalized weakness(s/p L TKA)    Cervical / Trunk Assessment Cervical / Trunk Assessment: Kyphotic  Communication   Communication: No difficulties  Cognition Arousal/Alertness: Awake/alert Behavior During Therapy: WFL for tasks assessed/performed Overall Cognitive Status: Within Functional Limits for tasks assessed                                        General Comments      Exercises     Assessment/Plan    PT Assessment Patient needs continued PT services  PT Problem List Decreased strength;Decreased range of motion;Decreased mobility;Decreased balance;Pain;Decreased knowledge of use of DME;Decreased activity tolerance       PT Treatment Interventions DME instruction;Therapeutic activities;Gait training;Functional mobility training;Balance  training;Patient/family education;Therapeutic exercise    PT Goals (Current goals can be found in the Care Plan section)  Acute Rehab PT Goals Patient Stated Goal: less pain. regain PLOF.  PT Goal Formulation: With patient Time For Goal Achievement: 09/24/18 Potential to Achieve Goals: Good    Frequency 7X/week   Barriers to discharge        Co-evaluation               AM-PAC PT "6 Clicks" Mobility  Outcome Measure Help needed turning from your back to your side while in a flat bed without using bedrails?: A  Little Help needed moving from lying on your back to sitting on the side of a flat bed without using bedrails?: A Little Help needed moving to and from a bed to a chair (including a wheelchair)?: A Little Help needed standing up from a chair using your arms (e.g., wheelchair or bedside chair)?: A Lot Help needed to walk in hospital room?: A Little Help needed climbing 3-5 steps with a railing? : A Lot 6 Click Score: 16    End of Session Equipment Utilized During Treatment: Gait belt Activity Tolerance: Patient tolerated treatment well Patient left: in chair;with call bell/phone within reach;with family/visitor present   PT Visit Diagnosis: Pain;Muscle weakness (generalized) (M62.81);Other abnormalities of gait and mobility (R26.89) Pain - Right/Left: Left Pain - part of body: Knee    Time: 5093-2671 PT Time Calculation (min) (ACUTE ONLY): 17 min   Charges:   PT Evaluation $PT Eval Low Complexity: 1 Low            Rebeca Alert, PT Acute Rehabilitation Services Pager: [phone] Office: [phone]

## 2018-09-10 NOTE — Progress Notes (Signed)
Received report from Edrick Oh in PACU. 20 minutes called.

## 2018-09-10 NOTE — Anesthesia Preprocedure Evaluation (Signed)
Anesthesia Evaluation  Patient identified by MRN, date of birth, ID band Patient awake    Reviewed: Allergy & Precautions, NPO status , Patient's Chart, lab work & pertinent test results  Airway Mallampati: II  TM Distance: >3 FB Neck ROM: Full    Dental no notable dental hx.    Pulmonary neg pulmonary ROS,    Pulmonary exam normal breath sounds clear to auscultation       Cardiovascular Normal cardiovascular exam+ Valvular Problems/Murmurs (moderate AS on ECHO 2018 ) AS  Rhythm:Regular Rate:Normal     Neuro/Psych negative neurological ROS  negative psych ROS   GI/Hepatic negative GI ROS, Neg liver ROS,   Endo/Other  diabetes  Renal/GU negative Renal ROS  negative genitourinary   Musculoskeletal negative musculoskeletal ROS (+)   Abdominal   Peds negative pediatric ROS (+)  Hematology negative hematology ROS (+)   Anesthesia Other Findings   Reproductive/Obstetrics negative OB ROS                             Anesthesia Physical Anesthesia Plan  ASA: II  Anesthesia Plan: General   Post-op Pain Management:  Regional for Post-op pain   Induction: Intravenous  PONV Risk Score and Plan: 3  Airway Management Planned: Oral ETT  Additional Equipment:   Intra-op Plan:   Post-operative Plan: Extubation in OR  Informed Consent: I have reviewed the patients History and Physical, chart, labs and discussed the procedure including the risks, benefits and alternatives for the proposed anesthesia with the patient or authorized representative who has indicated his/her understanding and acceptance.     Dental advisory given  Plan Discussed with: CRNA  Anesthesia Plan Comments:         Anesthesia Quick Evaluation

## 2018-09-10 NOTE — Op Note (Signed)
PREOP DIAGNOSIS: DJD LEFT KNEE POSTOP DIAGNOSIS:  same PROCEDURE: LEFT TKR ANESTHESIA: general and block ATTENDING SURGEON: Velna Ochs ASSISTANT: Elodia Florence PA  INDICATIONS FOR PROCEDURE: Dana Sandoval is a 78 y.o. female who has struggled for a long time with pain due to degenerative arthritis of the left knee.  The patient has failed many conservative non-operative measures and at this point has pain which limits the ability to sleep and walk.  The patient is offered total knee replacement.  Informed operative consent was obtained after discussion of possible risks of anesthesia, infection, neurovascular injury, DVT, and death.  The importance of the post-operative rehabilitation protocol to optimize result was stressed extensively with the patient.  SUMMARY OF FINDINGS AND PROCEDURE:  Dana Sandoval was taken to the operative suite where under the above anesthesia a left knee replacement was performed.  There were advanced degenerative changes and the bone quality was good.  We used the DePuyLCS system and placed size standard femur, 3 MBT revision tibia, 38 mm all polyethylene patella, and a size 12.5 mm spacer.  Elodia Florence PA-C assisted throughout and was invaluable to the completion of the case in that he helped retract and maintain exposure while I placed the components.  He also helped close thereby minimizing OR time.  The patient was admitted for appropriate post-op care to include perioperative antibiotics and mechanical and pharmacologic measures for DVT prophylaxis.  DESCRIPTION OF PROCEDURE:  Dana Sandoval was taken to the operative suite where the above anesthesia was applied.  The patient was positioned supine and prepped and draped in normal sterile fashion.  An appropriate time out was performed.  After the administration of kefzol pre-op antibiotic the leg was elevated and exsanguinated and a tourniquet inflated.  A standard longitudinal incision was made on the anterior  knee.  Dissection was carried down to the extensor mechanism.  All appropriate anti-infective measures were used including the pre-operative antibiotic, betadine impregnated drape, and closed hooded exhaust systems for each member of the surgical team.  A medial parapatellar incision was made in the extensor mechanism and the knee cap flipped and the knee flexed.  Some residual meniscal tissues were removed along with any remaining ACL/PCL tissue.  A guide was placed on the tibia and a flat cut was made on it's superior surface.  An intramedullary guide was placed in the femur and was utilized to make anterior and posterior cuts creating an appropriate flexion gap.  A second intramedullary guide was placed in the femur to make a distal cut properly balancing the knee with an extension gap equal to the flexion gap.  The three bones sized to the above mentioned sizes and the appropriate guides were placed and utilized.  A trial reduction was done and the knee easily came to full extension and the patella tracked well on flexion.  The trial components were removed and all bones were cleaned with pulsatile lavage and then dried thoroughly.  Cement was mixed and was pressurized onto the bones followed by placement of the aforementioned components.  Excess cement was trimmed and pressure was held on the components until the cement had hardened.  The tourniquet was deflated and a small amount of bleeding was controlled with cautery and pressure.  The knee was irrigated thoroughly.  The extensor mechanism was re-approximated with V-loc suture in running fashion.  The knee was flexed and the repair was solid.  The subcutaneous tissues were re-approximated with #0 and #2-0 vicryl and  the skin closed with a subcuticular stitch and steristrips.  A sterile dressing was applied.  Intraoperative fluids, EBL, and tourniquet time can be obtained from anesthesia records.  DISPOSITION:  The patient was taken to recovery room in  stable condition and admitted for appropriate post-op care to include peri-operative antibiotic and DVT prophylaxis with mechanical and pharmacologic measures.  Dana Sandoval 09/10/2018, 11:41 AM

## 2018-09-10 NOTE — Telephone Encounter (Signed)
Error

## 2018-09-10 NOTE — Anesthesia Procedure Notes (Signed)
Anesthesia Regional Block: Adductor canal block   Pre-Anesthetic Checklist: ,, timeout performed, Correct Patient, Correct Site, Correct Laterality, Correct Procedure, Correct Position, site marked, Risks and benefits discussed,  Surgical consent,  Pre-op evaluation,  At surgeon's request and post-op pain management  Laterality: Left and Lower  Prep: Maximum Sterile Barrier Precautions used, chloraprep       Needles:  Injection technique: Single-shot  Needle Type: Echogenic Stimulator Needle     Needle Length: 10cm      Additional Needles:   Procedures:,,,, ultrasound used (permanent image in chart),,,,  Narrative:  Start time: 09/10/2018 9:15 AM End time: 09/10/2018 9:25 AM Injection made incrementally with aspirations every 5 mL.  Performed by: Personally  Anesthesiologist: Phillips Grout, MD  Additional Notes: Risks, benefits and alternative to block explained extensively.  Patient tolerated procedure well, without complications.

## 2018-09-11 ENCOUNTER — Encounter (HOSPITAL_COMMUNITY): Payer: Self-pay | Admitting: Orthopaedic Surgery

## 2018-09-11 DIAGNOSIS — M1712 Unilateral primary osteoarthritis, left knee: Secondary | ICD-10-CM | POA: Diagnosis not present

## 2018-09-11 LAB — GLUCOSE, CAPILLARY: Glucose-Capillary: 139 mg/dL — ABNORMAL HIGH (ref 70–99)

## 2018-09-11 MED ORDER — HYDROCODONE-ACETAMINOPHEN 5-325 MG PO TABS: 1.0000 | ORAL_TABLET | Freq: Four times a day (QID) | ORAL | 0 refills | Status: DC | PRN

## 2018-09-11 MED ORDER — ASPIRIN 325 MG PO TBEC: 325.0000 mg | DELAYED_RELEASE_TABLET | Freq: Two times a day (BID) | ORAL | 0 refills | Status: DC

## 2018-09-11 MED ORDER — TIZANIDINE HCL 2 MG PO TABS: 2.0000 mg | ORAL_TABLET | Freq: Four times a day (QID) | ORAL | 1 refills | Status: DC | PRN

## 2018-09-11 NOTE — Discharge Summary (Signed)
Patient ID: Dana Sandoval MRN: [id number] DOB/AGE: [birth year] 78 y.o.  Admit date: 09/10/2018 Discharge date: 09/11/2018  Admission Diagnoses:  Principal Problem:   Primary osteoarthritis of left knee   Discharge Diagnoses:  Same  Past Medical History:  Diagnosis Date  . Aortic stenosis   . Arthritis   . Diabetes mellitus    type 2  . Heart murmur    aortic stenosis  . Hyperlipidemia   . Hypothyroidism   . IBS (irritable bowel syndrome)   . NSVD (normal spontaneous vaginal delivery)    X 4  . Pain    INTRASCAPULAR PAIN  . SAB (spontaneous abortion)    X 2  . Thyroid disease    HYPOTHYROIDISM    Surgeries: Procedure(s): TOTAL KNEE ARTHROPLASTY on 09/10/2018   Consultants:   Discharged Condition: Improved  Hospital Course: Dana Sandoval is an 78 y.o. female who was admitted 09/10/2018 for operative treatment ofPrimary osteoarthritis of left knee. Patient has severe unremitting pain that affects sleep, daily activities, and work/hobbies. After pre-op clearance the patient was taken to the operating room on 09/10/2018 and underwent  Procedure(s): TOTAL KNEE ARTHROPLASTY.    Patient was given perioperative antibiotics:  Anti-infectives (From admission, onward)   Start     Dose/Rate Route Frequency Ordered Stop   09/10/18 1600  ceFAZolin (ANCEF) IVPB 2g/100 mL premix     2 g 200 mL/hr over 30 Minutes Intravenous Every 6 hours 09/10/18 1445 09/10/18 2238   09/10/18 0800  ceFAZolin (ANCEF) IVPB 2g/100 mL premix     2 g 200 mL/hr over 30 Minutes Intravenous On call to O.R. 09/10/18 0746 09/10/18 1026       Patient was given sequential compression devices, early ambulation, and chemoprophylaxis to prevent DVT.  Patient benefited maximally from hospital stay and there were no complications.    Recent vital signs:  Patient Vitals for the past 24 hrs:  BP Temp Temp src Pulse Resp SpO2 Height Weight  09/11/18 0542 (!) 139/47 98.2 F (36.8 C) Oral 89 16 98 % - -   09/11/18 0140 (!) 127/44 98.1 F (36.7 C) Oral 78 16 98 % - -  09/10/18 2034 (!) 145/47 98.1 F (36.7 C) Oral 79 16 99 % - -  09/10/18 1817 (!) 131/94 97.8 F (36.6 C) Oral 74 16 98 % - -  09/10/18 1711 (!) 144/45 (!) 97.5 F (36.4 C) Oral 69 16 98 % - -  09/10/18 1542 (!) 159/65 (!) 96.7 F (35.9 C) Axillary 69 17 100 % - -  09/10/18 1434 (!) 158/60 (!) 97.5 F (36.4 C) Oral 72 14 96 % - -  09/10/18 1415 (!) 147/56 (!) 97.4 F (36.3 C) - 68 12 99 % - -  09/10/18 1400 (!) 168/49 - - 69 12 100 % - -  09/10/18 1345 (!) 160/56 (!) 97.5 F (36.4 C) - 73 14 100 % - -  09/10/18 1330 (!) 158/58 - - 64 11 100 % - -  09/10/18 1315 (!) 162/85 - - 75 10 100 % - -  09/10/18 1300 (!) 160/59 - - 73 18 100 % - -  09/10/18 1245 (!) 150/62 - - 71 (!) 9 100 % - -  09/10/18 1230 (!) 155/62 - - 76 10 100 % - -  09/10/18 1215 (!) 178/60 97.7 F (36.5 C) - 78 13 100 % - -  09/10/18 0957 - - - 70 14 100 % - -  09/10/18 4098 - - -  71 13 100 % - -  09/10/18 0955 (!) 141/58 - - 73 11 100 % - -  09/10/18 0954 - - - 72 12 100 % - -  09/10/18 0953 - - - 70 12 100 % - -  09/10/18 0952 - - - 72 12 100 % - -  09/10/18 0951 136/64 - - 70 12 100 % - -  09/10/18 0950 - - - 75 12 100 % - -  09/10/18 0949 - - - 75 15 100 % - -  09/10/18 0948 - - - 76 12 100 % - -  09/10/18 0947 - - - 72 13 100 % - -  09/10/18 0946 (!) 137/47 - - 75 16 100 % - -  09/10/18 0945 - - - 73 16 100 % - -  09/10/18 0944 - - - 74 13 100 % - -  09/10/18 0943 - - - 73 16 100 % - -  09/10/18 0942 - - - 74 16 100 % - -  09/10/18 0941 - - - 71 11 100 % - -  09/10/18 0940 (!) 134/55 - - 74 13 100 % - -  09/10/18 0939 - - - 73 16 100 % - -  09/10/18 0938 - - - 71 12 100 % - -  09/10/18 0937 - - - 73 15 100 % - -  09/10/18 0936 - - - 72 14 100 % - -  09/10/18 0935 (!) 135/48 - - 72 16 100 % - -  09/10/18 0934 - - - 73 11 99 % - -  09/10/18 0933 - - - 76 16 99 % - -  09/10/18 0932 - - - 73 11 99 % - -  09/10/18 0931 - - - 73 12 99 % -  -  09/10/18 0930 (!) 128/52 - - 72 (!) 21 100 % - -  09/10/18 0929 - - - 71 14 100 % - -  09/10/18 0928 - - - 77 14 99 % - -  09/10/18 0927 (!) 131/54 - - 76 11 98 % - -  09/10/18 0926 - - - 71 12 99 % - -  09/10/18 0925 - - - 66 (!) 0 99 % - -  09/10/18 0924 - - - 71 13 99 % - -  09/10/18 0923 (!) 129/57 - - 66 14 99 % - -  09/10/18 0922 - - - 72 12 95 % - -  09/10/18 0921 - - - 74 13 92 % - -  09/10/18 0920 - - - 65 (!) 0 99 % - -  09/10/18 0919 - - - 67 - 99 % - -  09/10/18 0918 - - - 74 (!) 9 100 % - -  09/10/18 0917 (!) 130/58 98.2 F (36.8 C) Oral 80 (!) 9 100 % - -  09/10/18 0806 (!) 164/59 98.1 F (36.7 C) Oral 75 16 99 % - -  09/10/18 0748 - - - - - - 5\' 2"  (1.575 m) 83.1 kg     Recent laboratory studies: No results for input(s): WBC, HGB, HCT, PLT, NA, K, CL, CO2, BUN, CREATININE, GLUCOSE, INR, CALCIUM in the last 72 hours.  Invalid input(s): PT, 2   Discharge Medications:   Allergies as of 09/11/2018      Reactions   Codeine Itching   After 3 days   Dilaudid [hydromorphone Hcl] Itching   After 3 days      Medication List  STOP taking these medications   aspirin 81 MG tablet Replaced by:  aspirin 325 MG EC tablet     TAKE these medications   aspirin 325 MG EC tablet Take 1 tablet (325 mg total) by mouth 2 (two) times daily after a meal. Replaces:  aspirin 81 MG tablet   atorvastatin 40 MG tablet Commonly known as:  LIPITOR Take 1 tablet (40 mg total) by mouth daily at 6 PM.   glucose blood test strip Commonly known as:  ACCU-CHEK AVIVA PLUS Use as instructed   HYDROcodone-acetaminophen 5-325 MG tablet Commonly known as:  NORCO/VICODIN Take 1-2 tablets by mouth every 6 (six) hours as needed for moderate pain (pain score 4-6).   levothyroxine 100 MCG tablet Commonly known as:  SYNTHROID, LEVOTHROID Take 1 tablet (100 mcg total) by mouth daily.   metFORMIN 500 MG 24 hr tablet Commonly known as:  GLUCOPHAGE-XR Take 1 tablet (500 mg total) by  mouth daily with breakfast.   NITROGLYCERIN PO Take 0.4 mg by mouth every 5 (five) minutes as needed (chest pain).   PRESERVISION AREDS 2 PO Take 1 tablet by mouth 2 (two) times daily.   ramipril 10 MG capsule Commonly known as:  ALTACE Take 1 capsule (10 mg total) by mouth daily.   tiZANidine 2 MG tablet Commonly known as:  ZANAFLEX Take 1-2 tablets (2-4 mg total) by mouth every 6 (six) hours as needed for muscle spasms.   Vitamin D 50 MCG (2000 UT) tablet Take 2,000 Units by mouth every other day.            Durable Medical Equipment  (From admission, onward)         Start     Ordered   09/10/18 1446  DME Walker rolling  Once    Question:  Patient needs a walker to treat with the following condition  Answer:  Primary osteoarthritis of left knee   09/10/18 1445   09/10/18 1446  DME 3 n 1  Once     09/10/18 1445   09/10/18 1446  DME Bedside commode  Once    Question:  Patient needs a bedside commode to treat with the following condition  Answer:  Primary osteoarthritis of left knee   09/10/18 1445          Diagnostic Studies: Dg Chest 2 View  Result Date: 09/02/2018 CLINICAL DATA:  Preoperative study.  Aortic stenosis. EXAM: CHEST - 2 VIEW COMPARISON:  July 06, 2010 FINDINGS: The heart size and mediastinal contours are within normal limits. Both lungs are clear. The visualized skeletal structures are unremarkable. IMPRESSION: No active cardiopulmonary disease. Electronically Signed   By: Gerome Sam III M.D   On: 09/02/2018 09:57    Disposition: Discharge disposition: 01-Home or Self Care       Discharge Instructions    Call MD / Call 911   Complete by:  As directed    If you experience chest pain or shortness of breath, CALL 911 and be transported to the hospital emergency room.  If you develope a fever above 101 F, pus (white drainage) or increased drainage or redness at the wound, or calf pain, call your surgeon's office.   Constipation Prevention    Complete by:  As directed    Drink plenty of fluids.  Prune juice may be helpful.  You may use a stool softener, such as Colace (over the counter) 100 mg twice a day.  Use MiraLax (over the counter) for constipation as needed.  Diet - low sodium heart healthy   Complete by:  As directed    Discharge instructions   Complete by:  As directed    INSTRUCTIONS AFTER JOINT REPLACEMENT   Remove items at home which could result in a fall. This includes throw rugs or furniture in walking pathways ICE to the affected joint every three hours while awake for 30 minutes at a time, for at least the first 3-5 days, and then as needed for pain and swelling.  Continue to use ice for pain and swelling. You may notice swelling that will progress down to the foot and ankle.  This is normal after surgery.  Elevate your leg when you are not up walking on it.   Continue to use the breathing machine you got in the hospital (incentive spirometer) which will help keep your temperature down.  It is common for your temperature to cycle up and down following surgery, especially at night when you are not up moving around and exerting yourself.  The breathing machine keeps your lungs expanded and your temperature down.   DIET:  As you were doing prior to hospitalization, we recommend a well-balanced diet.  DRESSING / WOUND CARE / SHOWERING  You may shower 3 days after surgery, but keep the wounds dry during showering.  You may use an occlusive plastic wrap (Press'n Seal for example), NO SOAKING/SUBMERGING IN THE BATHTUB.  If the bandage gets wet, change with a clean dry gauze.  If the incision gets wet, pat the wound dry with a clean towel.  ACTIVITY  Increase activity slowly as tolerated, but follow the weight bearing instructions below.   No driving for 6 weeks or until further direction given by your physician.  You cannot drive while taking narcotics.  No lifting or carrying greater than 10 lbs. until further  directed by your surgeon. Avoid periods of inactivity such as sitting longer than an hour when not asleep. This helps prevent blood clots.  You may return to work once you are authorized by your doctor.     WEIGHT BEARING   Weight bearing as tolerated with assist device (walker, cane, etc) as directed, use it as long as suggested by your surgeon or therapist, typically at least 4-6 weeks.   EXERCISES  Results after joint replacement surgery are often greatly improved when you follow the exercise, range of motion and muscle strengthening exercises prescribed by your doctor. Safety measures are also important to protect the joint from further injury. Any time any of these exercises cause you to have increased pain or swelling, decrease what you are doing until you are comfortable again and then slowly increase them. If you have problems or questions, call your caregiver or physical therapist for advice.   Rehabilitation is important following a joint replacement. After just a few days of immobilization, the muscles of the leg can become weakened and shrink (atrophy).  These exercises are designed to build up the tone and strength of the thigh and leg muscles and to improve motion. Often times heat used for twenty to thirty minutes before working out will loosen up your tissues and help with improving the range of motion but do not use heat for the first two weeks following surgery (sometimes heat can increase post-operative swelling).   These exercises can be done on a training (exercise) mat, on the floor, on a table or on a bed. Use whatever works the best and is most comfortable for you.    Use  music or television while you are exercising so that the exercises are a pleasant break in your day. This will make your life better with the exercises acting as a break in your routine that you can look forward to.   Perform all exercises about fifteen times, three times per day or as directed.  You should  exercise both the operative leg and the other leg as well.   Exercises include:   Quad Sets - Tighten up the muscle on the front of the thigh (Quad) and hold for 5-10 seconds.   Straight Leg Raises - With your knee straight (if you were given a brace, keep it on), lift the leg to 60 degrees, hold for 3 seconds, and slowly lower the leg.  Perform this exercise against resistance later as your leg gets stronger.  Leg Slides: Lying on your back, slowly slide your foot toward your buttocks, bending your knee up off the floor (only go as far as is comfortable). Then slowly slide your foot back down until your leg is flat on the floor again.  Angel Wings: Lying on your back spread your legs to the side as far apart as you can without causing discomfort.  Hamstring Strength:  Lying on your back, push your heel against the floor with your leg straight by tightening up the muscles of your buttocks.  Repeat, but this time bend your knee to a comfortable angle, and push your heel against the floor.  You may put a pillow under the heel to make it more comfortable if necessary.   A rehabilitation program following joint replacement surgery can speed recovery and prevent re-injury in the future due to weakened muscles. Contact your doctor or a physical therapist for more information on knee rehabilitation.    CONSTIPATION  Constipation is defined medically as fewer than three stools per week and severe constipation as less than one stool per week.  Even if you have a regular bowel pattern at home, your normal regimen is likely to be disrupted due to multiple reasons following surgery.  Combination of anesthesia, postoperative narcotics, change in appetite and fluid intake all can affect your bowels.   YOU MUST use at least one of the following options; they are listed in order of increasing strength to get the job done.  They are all available over the counter, and you may need to use some, POSSIBLY even all of  these options:    Drink plenty of fluids (prune juice may be helpful) and high fiber foods Colace 100 mg by mouth twice a day  Senokot for constipation as directed and as needed Dulcolax (bisacodyl), take with full glass of water  Miralax (polyethylene glycol) once or twice a day as needed.  If you have tried all these things and are unable to have a bowel movement in the first 3-4 days after surgery call either your surgeon or your primary doctor.    If you experience loose stools or diarrhea, hold the medications until you stool forms back up.  If your symptoms do not get better within 1 week or if they get worse, check with your doctor.  If you experience "the worst abdominal pain ever" or develop nausea or vomiting, please contact the office immediately for further recommendations for treatment.   ITCHING:  If you experience itching with your medications, try taking only a single pain pill, or even half a pain pill at a time.  You can also use Benadryl over the counter  for itching or also to help with sleep.   TED HOSE STOCKINGS:  Use stockings on both legs until for at least 2 weeks or as directed by physician office. They may be removed at night for sleeping.  MEDICATIONS:  See your medication summary on the "After Visit Summary" that nursing will review with you.  You may have some home medications which will be placed on hold until you complete the course of blood thinner medication.  It is important for you to complete the blood thinner medication as prescribed.  PRECAUTIONS:  If you experience chest pain or shortness of breath - call 911 immediately for transfer to the hospital emergency department.   If you develop a fever greater that 101 F, purulent drainage from wound, increased redness or drainage from wound, foul odor from the wound/dressing, or calf pain - CONTACT YOUR SURGEON.                                                   FOLLOW-UP APPOINTMENTS:  If you do not already have  a post-op appointment, please call the office for an appointment to be seen by your surgeon.  Guidelines for how soon to be seen are listed in your "After Visit Summary", but are typically between 1-4 weeks after surgery.  OTHER INSTRUCTIONS:   Knee Replacement:  Do not place pillow under knee, focus on keeping the knee straight while resting. CPM instructions: 0-90 degrees, 2 hours in the morning, 2 hours in the afternoon, and 2 hours in the evening. Place foam block, curve side up under heel at all times except when in CPM or when walking.  DO NOT modify, tear, cut, or change the foam block in any way.  MAKE SURE YOU:  Understand these instructions.  Get help right away if you are not doing well or get worse.    Thank you for letting us be a part of your medical care team.  It is a privilege we respect greatly.  We hope these instructions will help you stay on track for a fast and full recovery!   Increase activity slowly as tolerated   Complete by:  As directed       Follow-up Information    Marcene Corning, MD. Schedule an appointment as soon as possible for a visit in 2 weeks.   Specialty:  Orthopedic Surgery Contact information: [address] [place] [address] [phone]            Signed: Ginger Organ Aimar [place] 09/11/2018, 7:29 AM

## 2018-09-11 NOTE — Progress Notes (Signed)
CSW consult- SNF Plan: Home Health RN case manager arranged Home care.

## 2018-09-11 NOTE — Care Management Obs Status (Signed)
MEDICARE OBSERVATION STATUS NOTIFICATION   Patient Details  Name: Dana Sandoval MRN: [id number] Date of Birth: 15-Sep-1940   Medicare Observation Status Notification Given:  Yes    Golda Acre, RN 09/11/2018, 11:18 AM

## 2018-09-11 NOTE — Progress Notes (Signed)
Physical Therapy Treatment Patient Details Name: Dana Sandoval MRN: [id number] DOB: 12-Nov-1940 Today's Date: 09/11/2018    History of Present Illness 78 yo female s/p L TKA 09/10/18. Hx of R TKA    PT Comments    Progressing well with mobility. Pt c/o L calf pain-pt stated she informed MD as well. Pt stated she received a call from someone saying they will come to hospital "to measure her for CPM.."??? All education completed-made Rn aware.     Follow Up Recommendations  Follow surgeon's recommendation for DC plan and follow-up therapies     Equipment Recommendations  None recommended by PT    Recommendations for Other Services       Precautions / Restrictions Precautions Precautions: Fall;Knee Restrictions Weight Bearing Restrictions: No Other Position/Activity Restrictions: WBAT    Mobility  Bed Mobility               General bed mobility comments: oob in recliner  Transfers Overall transfer level: Needs assistance Equipment used: Rolling walker (2 wheeled) Transfers: Sit to/from Stand Sit to Stand: Min assist         General transfer comment: Small amount of assist. VCs safety, hand placement. Increased time.   Ambulation/Gait Ambulation/Gait assistance: Min guard Gait Distance (Feet): 75 Feet Assistive device: Rolling walker (2 wheeled) Gait Pattern/deviations: Step-to pattern;Step-through pattern;Decreased stride length;Trunk flexed     General Gait Details: close guard for safety. slow gait speed.    Stairs             Wheelchair Mobility    Modified Rankin (Stroke Patients Only)       Balance                                            Cognition Arousal/Alertness: Awake/alert Behavior During Therapy: WFL for tasks assessed/performed Overall Cognitive Status: Within Functional Limits for tasks assessed                                        Exercises Total Joint Exercises Ankle  Circles/Pumps: AROM;Both;10 reps;Seated Quad Sets: AROM;10 reps;Seated Hip ABduction/ADduction: AROM;Left;10 reps;Seated Straight Leg Raises: AROM;Left;10 reps;Seated Knee Flexion: AROM;Left;10 reps;Seated Goniometric ROM: ~10-65 degrees    General Comments        Pertinent Vitals/Pain Pain Assessment: 0-10 Pain Score: 7  Pain Location: L calf Pain Descriptors / Indicators: Aching;Sore;Tender Pain Intervention(s): Monitored during session;Repositioned;Ice applied    Home Living                      Prior Function            PT Goals (current goals can now be found in the care plan section) Progress towards PT goals: Progressing toward goals    Frequency    7X/week      PT Plan Current plan remains appropriate    Co-evaluation              AM-PAC PT "6 Clicks" Mobility   Outcome Measure  Help needed turning from your back to your side while in a flat bed without using bedrails?: A Little Help needed moving from lying on your back to sitting on the side of a flat bed without using bedrails?: A Little Help needed moving to and from  a bed to a chair (including a wheelchair)?: A Little Help needed standing up from a chair using your arms (e.g., wheelchair or bedside chair)?: A Little Help needed to walk in hospital room?: A Little Help needed climbing 3-5 steps with a railing? : A Lot 6 Click Score: 17    End of Session Equipment Utilized During Treatment: Gait belt Activity Tolerance: Patient tolerated treatment well Patient left: in bed;with call bell/phone within reach;with family/visitor present   PT Visit Diagnosis: Pain;Muscle weakness (generalized) (M62.81);Other abnormalities of gait and mobility (R26.89) Pain - Right/Left: Left Pain - part of body: Knee     Time: 0626-9485 PT Time Calculation (min) (ACUTE ONLY): 21 min  Charges:  $Gait Training: 8-22 mins                       Rebeca Alert, PT Acute Rehabilitation  Services Pager: [phone] Office: [phone]

## 2018-09-11 NOTE — Care Management Note (Signed)
Case Management Note  Patient Details  Name: Dana Sandoval MRN: [id number] Date of Birth: 1941/03/01  Subjective/Objective:                  DISCHARGE PLANNING  Action/Plan: HHC- PT THROUGH [facility]- HAS ROLLING WALKER, 3 IN 1 ORDERED THROUGH ADAPT  Expected Discharge Date:  09/11/18               Expected Discharge Plan:  Home w Home Health Services  In-House Referral:     Discharge planning Services  CM Consult  Post Acute Care Choice:  Durable Medical Equipment, Home Health Choice offered to:  Patient  DME Arranged:  3-N-1, Walker rolling DME Agency:  [facility]  HH Arranged:  PT HH Agency:  [facility]  Status of Service:  Completed, signed off  If discussed at [employer], dates discussed:    Additional Comments:  Golda Acre, RN 09/11/2018, 9:58 AM

## 2018-09-11 NOTE — Progress Notes (Signed)
Subjective: 1 Day Post-Op Procedure(s) (LRB): TOTAL KNEE ARTHROPLASTY (Left)   Patient feels well and is hoping to go home today.  Activity level:  wbat Diet tolerance:  ok Voiding:  ok Patient reports pain as mild.    Objective: Vital signs in last 24 hours: Temp:  [96.7 F (35.9 C)-98.2 F (36.8 C)] 98.2 F (36.8 C) (03/04 0542) Pulse Rate:  [64-89] 89 (03/04 0542) Resp:  [0-21] 16 (03/04 0542) BP: (127-178)/(44-94) 139/47 (03/04 0542) SpO2:  [92 %-100 %] 98 % (03/04 0542) Weight:  [83.1 kg] 83.1 kg (03/03 0748)  Labs: No results for input(s): HGB in the last 72 hours. No results for input(s): WBC, RBC, HCT, PLT in the last 72 hours. No results for input(s): NA, K, CL, CO2, BUN, CREATININE, GLUCOSE, CALCIUM in the last 72 hours. No results for input(s): LABPT, INR in the last 72 hours.  Physical Exam:  Neurologically intact ABD soft Neurovascular intact Sensation intact distally Intact pulses distally Dorsiflexion/Plantar flexion intact Incision: dressing C/D/I and no drainage No cellulitis present Compartment soft  Assessment/Plan:  1 Day Post-Op Procedure(s) (LRB): TOTAL KNEE ARTHROPLASTY (Left) Advance diet Up with therapy D/C IV fluids Discharge home with home health today after PT if doing well and cleared.  Continue on ASA 325mg  BID x 2 weeks post op. Follow up in office 2 weeks post op.  Patient's anticipated LOS is less than 2 midnights, meeting these requirements: - Younger than 12 - Lives within 1 hour of care - Has a competent adult at home to recover with post-op recover - NO history of  - Chronic pain requiring opiods  - Diabetes  - Coronary Artery Disease  - Heart failure  - Heart attack  - Stroke  - DVT/VTE  - Cardiac arrhythmia  - Respiratory Failure/COPD  - Renal failure  - Anemia  - Advanced Liver disease  Dana Sandoval 09/11/2018, 7:25 AM

## 2018-09-11 NOTE — Anesthesia Postprocedure Evaluation (Signed)
Anesthesia Post Note  Patient: Dana Sandoval  Procedure(s) Performed: TOTAL KNEE ARTHROPLASTY (Left )     Patient location during evaluation: PACU Anesthesia Type: General Level of consciousness: awake and alert Pain management: pain level controlled Vital Signs Assessment: post-procedure vital signs reviewed and stable Respiratory status: spontaneous breathing, nonlabored ventilation, respiratory function stable and patient connected to nasal cannula oxygen Cardiovascular status: blood pressure returned to baseline and stable Postop Assessment: no apparent nausea or vomiting Anesthetic complications: no    Last Vitals:  Vitals:   09/11/18 0542 09/11/18 0918  BP: (!) 139/47 (!) 116/43  Pulse: 89 72  Resp: 16 16  Temp: 36.8 C 36.5 C  SpO2: 98% 98%    Last Pain:  Vitals:   09/11/18 1000  TempSrc:   PainSc: 0-No pain                 Phillips Grout

## 2018-09-11 NOTE — Plan of Care (Signed)
  Problem: Activity: Goal: Ability to avoid complications of mobility impairment will improve Outcome: Progressing   Problem: Pain Management: Goal: Pain level will decrease with appropriate interventions Outcome: Progressing   Problem: Education: Goal: Knowledge of the prescribed therapeutic regimen will improve Outcome: Progressing   

## 2018-10-30 ENCOUNTER — Other Ambulatory Visit: Payer: Self-pay | Admitting: Cardiology

## 2018-10-30 DIAGNOSIS — R0989 Other specified symptoms and signs involving the circulatory and respiratory systems: Secondary | ICD-10-CM

## 2018-10-30 DIAGNOSIS — I350 Nonrheumatic aortic (valve) stenosis: Secondary | ICD-10-CM

## 2018-10-30 DIAGNOSIS — R011 Cardiac murmur, unspecified: Secondary | ICD-10-CM

## 2018-12-16 ENCOUNTER — Telehealth: Payer: Self-pay | Admitting: *Deleted

## 2018-12-16 ENCOUNTER — Other Ambulatory Visit: Payer: Self-pay | Admitting: Family Medicine

## 2018-12-16 DIAGNOSIS — E119 Type 2 diabetes mellitus without complications: Secondary | ICD-10-CM

## 2018-12-16 MED ORDER — METFORMIN HCL 500 MG PO TABS: 500.0000 mg | ORAL_TABLET | Freq: Two times a day (BID) | ORAL | 3 refills | Status: DC

## 2018-12-16 NOTE — Telephone Encounter (Signed)
Patient advised of everything and verbalized understanding 

## 2018-12-16 NOTE — Telephone Encounter (Signed)
Please call patient and tell her that only the extended release metformin was recalled. I have sent her in some of the regular metformin- she is to take one with breakfast and one with dinner. It is a generic and should not be expensive. It is ok for her to the supplement but it will not treat her diabetes in the same way the metformin will.

## 2018-12-16 NOTE — Progress Notes (Signed)
Metformin XR 500 mg changed to meformin 500 mg BID due to medication being recalled.

## 2018-12-16 NOTE — Telephone Encounter (Signed)
Patient called stating that she got a call from her pharmacy Saturday telling her not to take her Metformin because it has been recalled. Patient stated that her daughter gave her some Berberine to take that is suppose to help with cholesterol, sugar and all kinds of things. Patient stated that she is coming in next month and doesn't really want to start another expensive medication at this point. Patient requested a call back. [place]

## 2019-01-23 ENCOUNTER — Other Ambulatory Visit (INDEPENDENT_AMBULATORY_CARE_PROVIDER_SITE_OTHER): Payer: Medicare Other

## 2019-01-23 ENCOUNTER — Other Ambulatory Visit: Payer: Self-pay

## 2019-01-23 ENCOUNTER — Ambulatory Visit: Payer: Medicare Other

## 2019-01-23 DIAGNOSIS — E039 Hypothyroidism, unspecified: Secondary | ICD-10-CM | POA: Diagnosis not present

## 2019-01-23 DIAGNOSIS — Z0000 Encounter for general adult medical examination without abnormal findings: Secondary | ICD-10-CM

## 2019-01-23 DIAGNOSIS — E119 Type 2 diabetes mellitus without complications: Secondary | ICD-10-CM | POA: Diagnosis not present

## 2019-01-23 DIAGNOSIS — E559 Vitamin D deficiency, unspecified: Secondary | ICD-10-CM

## 2019-01-23 LAB — CBC WITH DIFFERENTIAL/PLATELET
Basophils Absolute: 0.1 10*3/uL (ref 0.0–0.1)
Basophils Relative: 1.2 % (ref 0.0–3.0)
Eosinophils Absolute: 0.4 10*3/uL (ref 0.0–0.7)
Eosinophils Relative: 6 % — ABNORMAL HIGH (ref 0.0–5.0)
HCT: 34.6 % — ABNORMAL LOW (ref 36.0–46.0)
Hemoglobin: 11.6 g/dL — ABNORMAL LOW (ref 12.0–15.0)
Lymphocytes Relative: 12.8 % (ref 12.0–46.0)
Lymphs Abs: 1 10*3/uL (ref 0.7–4.0)
MCHC: 33.6 g/dL (ref 30.0–36.0)
MCV: 81.1 fl (ref 78.0–100.0)
Monocytes Absolute: 0.7 10*3/uL (ref 0.1–1.0)
Monocytes Relative: 9.9 % (ref 3.0–12.0)
Neutro Abs: 5.2 10*3/uL (ref 1.4–7.7)
Neutrophils Relative %: 70.1 % (ref 43.0–77.0)
Platelets: 348 10*3/uL (ref 150.0–400.0)
RBC: 4.26 Mil/uL (ref 3.87–5.11)
RDW: 14.8 % (ref 11.5–15.5)
WBC: 7.5 10*3/uL (ref 4.0–10.5)

## 2019-01-23 LAB — COMPREHENSIVE METABOLIC PANEL
ALT: 9 U/L (ref 0–35)
AST: 12 U/L (ref 0–37)
Albumin: 3.8 g/dL (ref 3.5–5.2)
Alkaline Phosphatase: 72 U/L (ref 39–117)
BUN: 24 mg/dL — ABNORMAL HIGH (ref 6–23)
CO2: 29 mEq/L (ref 19–32)
Calcium: 8.6 mg/dL (ref 8.4–10.5)
Chloride: 103 mEq/L (ref 96–112)
Creatinine, Ser: 0.85 mg/dL (ref 0.40–1.20)
GFR: 64.4 mL/min (ref >60.00)
Glucose, Bld: 144 mg/dL — ABNORMAL HIGH (ref 70–99)
Potassium: 4.2 mEq/L (ref 3.5–5.1)
Sodium: 140 mEq/L (ref 135–145)
Total Bilirubin: 0.6 mg/dL (ref 0.2–1.2)
Total Protein: 5.9 g/dL — ABNORMAL LOW (ref 6.0–8.3)

## 2019-01-23 LAB — MICROALBUMIN / CREATININE URINE RATIO
Creatinine,U: 101.3 mg/dL
Microalb Creat Ratio: 0.8 mg/g (ref 0.0–30.0)
Microalb, Ur: 0.8 mg/dL (ref 0.0–1.9)

## 2019-01-23 LAB — LIPID PANEL
Cholesterol: 135 mg/dL (ref 0–200)
HDL: 31.1 mg/dL — ABNORMAL LOW (ref >39.00)
LDL Cholesterol: 69 mg/dL (ref 0–99)
NonHDL: 104.01
Total CHOL/HDL Ratio: 4
Triglycerides: 175 mg/dL — ABNORMAL HIGH (ref 0.0–149.0)
VLDL: 35 mg/dL (ref 0.0–40.0)

## 2019-01-23 LAB — HEMOGLOBIN A1C: Hgb A1c MFr Bld: 7.1 % — ABNORMAL HIGH (ref 4.6–6.5)

## 2019-01-23 LAB — VITAMIN D 25 HYDROXY (VIT D DEFICIENCY, FRACTURES): VITD: 36.17 ng/mL (ref 30.00–100.00)

## 2019-01-23 LAB — TSH: TSH: 1.06 u[IU]/mL (ref 0.35–4.50)

## 2019-01-29 ENCOUNTER — Ambulatory Visit (INDEPENDENT_AMBULATORY_CARE_PROVIDER_SITE_OTHER): Payer: Medicare Other | Admitting: Family Medicine

## 2019-01-29 ENCOUNTER — Other Ambulatory Visit: Payer: Self-pay

## 2019-01-29 ENCOUNTER — Encounter: Payer: Self-pay | Admitting: Family Medicine

## 2019-01-29 VITALS — BP 124/60 | HR 84 | Temp 98.1°F | Ht 62.0 in | Wt 188.2 lb

## 2019-01-29 DIAGNOSIS — E119 Type 2 diabetes mellitus without complications: Secondary | ICD-10-CM | POA: Diagnosis not present

## 2019-01-29 DIAGNOSIS — Z0000 Encounter for general adult medical examination without abnormal findings: Secondary | ICD-10-CM | POA: Diagnosis not present

## 2019-01-29 NOTE — Progress Notes (Signed)
Subjective:   Dana Sandoval is a 78 y.o. female who presents for Medicare Annual (Subsequent) preventive examination.  Review of Systems:  Review of Systems  Constitutional: Negative.   HENT: Negative.   Eyes: Negative.   Respiratory: Negative.   Cardiovascular: Negative.   Gastrointestinal: Negative.   Genitourinary: Negative.   Musculoskeletal: Positive for joint pain (left knee, recent TKR, pulled knee using low toilet seat. Has follow up with ortho in September. ).  Skin: Negative.   Neurological: Negative.   Endo/Heme/Allergies: Negative.   Psychiatric/Behavioral: Negative.           Objective:    Physical Exam  Vitals reviewed. Constitutional: Oriented to person, place, and time. Appears well-developed and well-nourished.  HENT:  Head: Normocephalic and atraumatic.  Eyes: Conjunctivae are normal.  Neck: Normal range of motion. Neck supple.  Cardiovascular: Normal rate.   Pulmonary/Chest: Effort normal.  Neurological: Alert and oriented to person, place, and time.  Skin: Skin is warm and dry.  Psychiatric: Normal mood and affect. Behavior is normal. Judgment and thought content normal.     Vitals: BP 124/60 (BP Location: Left Arm, Patient Position: Sitting, Cuff Size: Normal)   Pulse 84   Temp 98.1 F (36.7 C) (Temporal)   Ht 5\' 2"  (1.575 m)   Wt 188 lb 4 oz (85.4 kg)   SpO2 97%   BMI 34.43 kg/m   Body mass index is 34.43 kg/m. Wt Readings from Last 3 Encounters:  01/29/19 188 lb 4 oz (85.4 kg)  09/10/18 183 lb 3 oz (83.1 kg)  09/02/18 183 lb 3 oz (83.1 kg)    Advanced Directives 09/10/2018 09/02/2018  Does Patient Have a Medical Advance Directive? Yes Yes  Type of Estate agentAdvance Directive Healthcare Power of [place]orney;Living will Healthcare Power of [place]orney;Living will  Does patient want to make changes to medical advance directive? No - Patient declined No - Patient declined  Copy of Healthcare Power of Attorney in Chart? No - copy requested -   Patient  would like brief CPR/ ventilation if there is a chance she will get better. She does not want prolonged treatment if no chance of improvement.   Tobacco Social History   Tobacco Use  Smoking Status Never Smoker  Smokeless Tobacco Never Used     Counseling given: Not Answered   Clinical Intake:  Past Medical History:  Diagnosis Date  . Aortic stenosis   . Arthritis   . Diabetes mellitus    type 2  . Heart murmur    aortic stenosis  . Hyperlipidemia   . Hypothyroidism   . IBS (irritable bowel syndrome)   . NSVD (normal spontaneous vaginal delivery)    X 4  . Pain    INTRASCAPULAR PAIN  . SAB (spontaneous abortion)    X 2  . Thyroid disease    HYPOTHYROIDISM   Past Surgical History:  Procedure Laterality Date  . ABDOMINAL HYSTERECTOMY  1977   TAH  (FIBROIDS)  . DILATION AND CURETTAGE OF UTERUS    . DOPPLER ECHOCARDIOGRAPHY  2009   HAD AN ECHOCARDIOGRAM THAT SHOWED MODERATE MITRAL  ANNULAR CALCIFICATION AND A NORMAL EJECTION FRACTION OF 55%  . OTHER SURGICAL HISTORY     HYSTERECTOMY  . SHOULDER SURGERY     RIGHT SHOULDER SURGERY  . THYROIDECTOMY    . 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 TOTAL KNEE ARTHROPLASTY Left 09/10/2018   Procedure: TOTAL KNEE ARTHROPLASTY;  Surgeon: Marcene Corningalldorf, Peter, MD;  [facility];  Service: Orthopedics;  Laterality: Left;   Family History  Problem Relation Age of Onset  . Osteoporosis Mother   . Heart disease Mother   . Heart disease Father   . Cancer Father        BRAIN TUMOR  . Diabetes Maternal Aunt   . Diabetes Maternal Uncle   . Cancer Brother        BRAIN TUMOR   Social History   Socioeconomic History  . Marital status: Married    Spouse name: Not on file  . Number of children: Not on file  . Years of education: Not on file  . Highest education level: Not on file  Occupational History  . Not on file  Social Needs  . Financial resource strain: Not on file  . Food insecurity    Worry: Not on file     Inability: Not on file  . Transportation needs    Medical: Not on file    Non-medical: Not on file  Tobacco Use  . Smoking status: Never Smoker  . Smokeless tobacco: Never Used  Substance and Sexual Activity  . Alcohol use: No    Alcohol/week: 0.0 standard drinks  . Drug use: No  . Sexual activity: Yes    Partners: Male    Birth control/protection: Surgical    Comment: 1st intercourse- 89, partners- 76, married- 57 yrs   Lifestyle  . Physical activity    Days per week: Not on file    Minutes per session: Not on file  . Stress: Not on file  Relationships  . Social Herbalist on phone: Not on file    Gets together: Not on file    Attends religious service: Not on file    Active member of club or organization: Not on file    Attends meetings of clubs or organizations: Not on file    Relationship status: Not on file  Other Topics Concern  . Not on file  Social History Narrative  . Not on file    Outpatient Encounter Medications as of 01/29/2019  Medication Sig  . aspirin EC 325 MG EC tablet Take 1 tablet (325 mg total) by mouth 2 (two) times daily after a meal.  . atorvastatin (LIPITOR) 40 MG tablet Take 1 tablet (40 mg total) by mouth daily at 6 PM.  . Cholecalciferol (VITAMIN D) 50 MCG (2000 UT) tablet Take 2,000 Units by mouth every other day.  Marland Kitchen glucose blood (ACCU-CHEK AVIVA PLUS) test strip Use as instructed  . HYDROcodone-acetaminophen (NORCO/VICODIN) 5-325 MG tablet Take 1-2 tablets by mouth every 6 (six) hours as needed for moderate pain (pain score 4-6).  Marland Kitchen levothyroxine (SYNTHROID, LEVOTHROID) 100 MCG tablet Take 1 tablet (100 mcg total) by mouth daily.  . metFORMIN (GLUCOPHAGE) 500 MG tablet Take 1 tablet (500 mg total) by mouth 2 (two) times daily with a meal.  . Multiple Vitamins-Minerals (PRESERVISION AREDS 2 PO) Take 1 tablet by mouth 2 (two) times daily.   Marland Kitchen NITROGLYCERIN PO Take 0.4 mg by mouth every 5 (five) minutes as needed (chest pain).   .  ramipril (ALTACE) 10 MG capsule Take 1 capsule (10 mg total) by mouth daily.  Marland Kitchen tiZANidine (ZANAFLEX) 2 MG tablet Take 1-2 tablets (2-4 mg total) by mouth every 6 (six) hours as needed for muscle spasms.   No facility-administered encounter medications on file as of 01/29/2019.     Activities of Daily Living In your present state of health,  do you have any difficulty performing the following activities: 09/10/2018 09/10/2018  Hearing? N N  Vision? N N  Difficulty concentrating or making decisions? N N  Walking or climbing stairs? Y Y  Dressing or bathing? N N  Doing errands, shopping? N -  Some recent data might be hidden    Patient Care Team: Emi BelfastGessner, Chandelle Harkey B, FNP as PCP - General (Nurse Practitioner)   Dr. Jerl Santosalldorf, orhthopedics Assessment:   This is a routine wellness examination for Dana Sandoval.  Exercise Activities and Dietary recommendations    Goals   Increase walking as tolerated with left knee pain. Work on decreasing simple starches, artificial sweeteners.      Fall Risk Fall Risk  01/29/2019 03/20/2018  Falls in the past year? 0 No   Is the patient's home free of loose throw rugs in walkways, pet beds, electrical cords, etc?   yes      Grab bars in the bathroom? yes      Handrails on the stairs?   yes      Adequate lighting?   yes    Depression Screen PHQ 2/9 Scores 01/29/2019 03/20/2018  PHQ - 2 Score 0 0     Cognitive Function   Alert and oriented x 3  Mini-Cog - 01/29/19 1739    Normal clock drawing test?  yes    How many words correct?  2            Immunization History  Administered Date(s) Administered  . Influenza, High Dose Seasonal PF 03/20/2018  . Pneumococcal Polysaccharide-23 04/10/2008, 04/22/2018  . Tdap 07/09/2012   Patient reports that she had Prevnar 13 one year prior to Pneumococcal 23- will try to get information from her pharmacy.   Qualifies for Shingles Vaccine- was advised to inquire at her local pharmacy  Screening Tests  Health Maintenance  Topic Date Due  . OPHTHALMOLOGY EXAM  05/17/2018  . PAP SMEAR-Modifier  06/22/2018  . INFLUENZA VACCINE  02/08/2019  . MAMMOGRAM  03/26/2019  . PNA vac Low Risk Adult (2 of 2 - PCV13) 04/23/2019  . FOOT EXAM  07/23/2019  . HEMOGLOBIN A1C  07/26/2019  . TETANUS/TDAP  07/09/2022  . DEXA SCAN  Completed    Cancer Screenings: Lung: Low Dose CT Chest recommended if Age 64-80 years, 30 pack-year currently smoking OR have quit w/in 15years. Patient does not qualify. Breast:  Up to date on Mammogram? Yes   Up to date of Bone Density/Dexa? Yes   Additional Screenings: : Hepatitis C Screening:      Plan:    I have personally reviewed and noted the following in the patient's chart:   . Medical and social history . Use of alcohol, tobacco or illicit drugs  . Current medications and supplements . Functional ability and status . Nutritional status . Physical activity . Advanced directives . List of other physicians . Hospitalizations, surgeries, and ER visits in previous 12 months . Vitals . Screenings to include cognitive, depression, and falls . Referrals and appointments  In addition, I have reviewed and discussed with patient certain preventive protocols, quality metrics, and best practice recommendations. A written personalized care plan for preventive services as well as general preventive health recommendations were provided to patient.   Follow up [id number] in 6 months  Emi BelfastDeborah B Kemani Demarais, FNP  01/29/2019

## 2019-01-29 NOTE — Patient Instructions (Signed)
  Dana Sandoval , Thank you for taking time to come for your Medicare Wellness Visit. I appreciate your ongoing commitment to your health goals. Please review the following plan we discussed and let me know if I can assist you in the future.   These are the goals we discussed: Goals   Make healthy food choices, move as much as possible     This is a list of the screening recommended for you and due dates:  Health Maintenance  Topic Date Due  . Eye exam for diabetics  05/17/2018  . Pap Smear  06/22/2018  . Flu Shot  02/08/2019  . Mammogram  03/26/2019  . Pneumonia vaccines (2 of 2 - PCV13) 04/23/2019  . Complete foot exam   07/23/2019  . Hemoglobin A1C  07/26/2019  . Tetanus Vaccine  07/09/2022  . DEXA scan (bone density measurement)  Completed

## 2019-03-31 LAB — HM DEXA SCAN

## 2019-03-31 LAB — HM MAMMOGRAPHY

## 2019-04-02 ENCOUNTER — Telehealth: Payer: Self-pay | Admitting: Family Medicine

## 2019-04-02 NOTE — Telephone Encounter (Signed)
Patient notified of results & had no further questions at this time.

## 2019-04-02 NOTE — Telephone Encounter (Signed)
Please call patient and let her know that her bone density scan was stable from the previous one. 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 We will repeat in 2 years. Paper copy from [facility] sent to be abstracted and scanned to EMR.

## 2019-04-03 ENCOUNTER — Ambulatory Visit: Payer: Medicare Other

## 2019-04-03 ENCOUNTER — Other Ambulatory Visit: Payer: Self-pay

## 2019-04-03 ENCOUNTER — Ambulatory Visit (INDEPENDENT_AMBULATORY_CARE_PROVIDER_SITE_OTHER): Payer: Medicare Other

## 2019-04-03 DIAGNOSIS — R011 Cardiac murmur, unspecified: Secondary | ICD-10-CM

## 2019-04-03 DIAGNOSIS — R0989 Other specified symptoms and signs involving the circulatory and respiratory systems: Secondary | ICD-10-CM | POA: Diagnosis not present

## 2019-04-03 DIAGNOSIS — I350 Nonrheumatic aortic (valve) stenosis: Secondary | ICD-10-CM

## 2019-04-07 ENCOUNTER — Encounter: Payer: Self-pay | Admitting: Anesthesiology

## 2019-04-09 ENCOUNTER — Other Ambulatory Visit: Payer: Self-pay

## 2019-04-09 ENCOUNTER — Encounter: Payer: Self-pay | Admitting: Cardiology

## 2019-04-09 ENCOUNTER — Ambulatory Visit: Payer: Medicare Other | Admitting: Cardiology

## 2019-04-09 DIAGNOSIS — E782 Mixed hyperlipidemia: Secondary | ICD-10-CM

## 2019-04-09 DIAGNOSIS — I351 Nonrheumatic aortic (valve) insufficiency: Secondary | ICD-10-CM | POA: Diagnosis not present

## 2019-04-09 DIAGNOSIS — I6523 Occlusion and stenosis of bilateral carotid arteries: Secondary | ICD-10-CM

## 2019-04-09 DIAGNOSIS — I350 Nonrheumatic aortic (valve) stenosis: Secondary | ICD-10-CM | POA: Diagnosis not present

## 2019-04-09 DIAGNOSIS — I10 Essential (primary) hypertension: Secondary | ICD-10-CM | POA: Diagnosis not present

## 2019-04-09 NOTE — Progress Notes (Signed)
Primary Physician/Referring:  Emi Belfast, FNP  Patient ID: Dana Sandoval, female    DOB: 05-08-1941, 78 y.o.   MRN: [id number]  Chief Complaint  Patient presents with  . Follow-up  . Aortic Stenosis   HPI:    Dana Sandoval  is a 78 y.o. Caucasian female with history of hyperlipidemia, controlled diabetes, HTN, hypothyroidism, and mild to moderate aortic stenosis and mild to moderate AI. She had a normal stress test in 2011 with no evidence of ischemia and LVEF of 83%. 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 Her daughter is Dr. Tomi Bamberger.  She underwent left knee replacement in March 2020 and has now fully recovered.  She is feeling well, no complaints today.  Tolerating medications well.  She recently had echocardiogram carotid duplex and labs with presents to discuss results.   Past Medical History:  Diagnosis Date  . Aortic stenosis   . Arthritis   . Diabetes mellitus    type 2  . Heart murmur    aortic stenosis  . Hyperlipidemia   . Hypothyroidism   . IBS (irritable bowel syndrome)   . NSVD (normal spontaneous vaginal delivery)    X 4  . Pain    INTRASCAPULAR PAIN  . SAB (spontaneous abortion)    X 2  . Thyroid disease    HYPOTHYROIDISM   Past Surgical History:  Procedure Laterality Date  . ABDOMINAL HYSTERECTOMY  1977   TAH  (FIBROIDS)  . DILATION AND CURETTAGE OF UTERUS    . DOPPLER ECHOCARDIOGRAPHY  2009   HAD AN ECHOCARDIOGRAM THAT SHOWED MODERATE MITRAL  ANNULAR CALCIFICATION AND A NORMAL EJECTION FRACTION OF 55%  . OTHER SURGICAL HISTORY     HYSTERECTOMY  . SHOULDER SURGERY     RIGHT SHOULDER SURGERY  . THYROIDECTOMY    . 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 TOTAL KNEE ARTHROPLASTY Left 09/10/2018   Procedure: TOTAL KNEE ARTHROPLASTY;  Surgeon: Marcene Corning, MD;  Location: [facility];  Service: Orthopedics;  Laterality: Left;   Social History    Socioeconomic History  . Marital status: Married    Spouse name: Not on file  . Number of children: 4  . Years of education: Not on file  . Highest education level: Not on file  Occupational History  . Not on file  Social Needs  . Financial resource strain: Not on file  . Food insecurity    Worry: Not on file    Inability: Not on file  . Transportation needs    Medical: Not on file    Non-medical: Not on file  Tobacco Use  . Smoking status: Never Smoker  . Smokeless tobacco: Never Used  Substance and Sexual Activity  . Alcohol use: No    Alcohol/week: 0.0 standard drinks  . Drug use: No  . Sexual activity: Yes    Partners: Male    Birth control/protection: Surgical    Comment: 1st intercourse- 18, partners- 1, married- 59 yrs   Lifestyle  . Physical activity    Days per week: Not on file    Minutes per session: Not on file  . Stress: Not on file  Relationships  . Social Musician on phone: Not on file    Gets together: Not on file    Attends religious service: Not on file    Active member of  club or organization: Not on file    Attends meetings of clubs or organizations: Not on file    Relationship status: Not on file  . Intimate partner violence    Fear of current or ex partner: Not on file    Emotionally abused: Not on file    Physically abused: Not on file    Forced sexual activity: Not on file  Other Topics Concern  . Not on file  Social History Narrative  . Not on file   ROS  Review of Systems  Constitution: Negative for chills, decreased appetite, malaise/fatigue and weight gain.  Cardiovascular: Negative for dyspnea on exertion, leg swelling and syncope.  Endocrine: Negative for cold intolerance.  Hematologic/Lymphatic: Does not bruise/bleed easily.  Musculoskeletal: Positive for arthritis and joint pain. Negative for joint swelling.  Gastrointestinal: Negative for abdominal pain, anorexia, change in bowel habit, hematochezia and melena.   Neurological: Negative for headaches and light-headedness.  Psychiatric/Behavioral: Negative for depression and substance abuse.  All other systems reviewed and are negative.  Objective   Vitals with BMI 01/29/2019 09/11/2018 09/11/2018  Height 5\' 2"  - -  Weight 188 lbs 4 oz - -  BMI 34.42 - -  Systolic 124 116  Diastolic 60 43 47  Pulse 84 72 89    There were no vitals taken for this visit. There is no height or weight on file to calculate BMI.   Physical Exam  Constitutional: She appears well-developed and well-nourished. No distress.  HENT:  Head: Atraumatic.  Eyes: Conjunctivae are normal.  Neck: Neck supple. No JVD present. No thyromegaly present.  Cardiovascular: Normal rate, regular rhythm, intact distal pulses and normal pulses. Exam reveals no gallop.  Murmur heard.  Harsh midsystolic murmur is present with a grade of 2/6 at the upper right sternal border radiating to the neck. High-pitched blowing decrescendo early diastolic murmur is present with a grade of 3/6 at the upper right sternal border radiating to the apex. Pulses:      Carotid pulses are on the right side with bruit and on the left side with bruit. Pulmonary/Chest: Effort normal and breath sounds normal.  Abdominal: Soft. Bowel sounds are normal.  Musculoskeletal: Normal range of motion.  Neurological: She is alert.  Skin: Skin is warm and dry.  Psychiatric: She has a normal mood and affect.   Radiology: No results found.  Laboratory examination:   Recent Labs    07/19/18 0803 09/02/18 0850 01/23/19 1117  NA 142 139 140  K 4.1 4.0 4.2  CL 105 105 103  CO2 29 27 29   GLUCOSE 140* 129* 144*  BUN 22 24* 24*  CREATININE 0.80 0.80 0.85  CALCIUM 9.0 8.8* 8.6  GFRNONAA  --  >60  --   GFRAA  --  >60  --    CMP Latest Ref Rng & Units 01/23/2019 09/02/2018 07/19/2018  Glucose 70 - 99 mg/dL 09/04/2018) 09/17/2018) 683(M)  BUN 6 - 23 mg/dL 196(Q) 229(N) 22  Creatinine 0.40 - 1.20 mg/dL 98(X 21(J 9.41  Sodium 135  - 145 mEq/L 140 139 142  Potassium 3.5 - 5.1 mEq/L 4.2 4.0 4.1  Chloride 96 - 112 mEq/L 103 105 105  CO2 19 - 32 mEq/L 29 27 29   Calcium 8.4 - 10.5 mg/dL 8.6 7.40) 9.0  Total Protein 6.0 - 8.3 g/dL 5.9(L) - 6.2  Total Bilirubin 0.2 - 1.2 mg/dL 0.6 - 0.5  Alkaline Phos 39 - 117 U/L 72 - 69  AST 0 -  37 U/L 12 - 15  ALT 0 - 35 U/L 9 - 12   CBC Latest Ref Rng & Units 01/23/2019 09/02/2018 07/14/2010  WBC 4.0 - 10.5 K/uL 7.5 6.9 11.1(H)  Hemoglobin 12.0 - 15.0 g/dL 11.6(L) 11.7(L) 9.1(L)  Hematocrit 36.0 - 46.0 % 34.6(L) 36.7 27.3(L)  Platelets 150.0 - 400.0 K/uL 348.0 368 247   Lipid Panel     Component Value Date/Time   CHOL 135 01/23/2019 1117   TRIG 175.0 (H) 01/23/2019 1117   HDL 31.10 (L) 01/23/2019 1117   CHOLHDL 4 01/23/2019 1117   VLDL 35.0 01/23/2019 1117   LDLCALC 69 01/23/2019 1117   HEMOGLOBIN A1C Lab Results  Component Value Date   HGBA1C 7.1 (H) 01/23/2019   MPG 120 (H) 07/13/2010   TSH Recent Labs    07/19/18 0803 01/23/19 1117  TSH 0.40 1.06   Medications   Allergies  Allergen Reactions  . Codeine Itching    After 3 days  . Dilaudid [Hydromorphone Hcl] Itching    After 3 days     Prior to Admission medications   Medication Sig Start Date End Date Taking? Authorizing Provider  aspirin EC 325 MG EC tablet Take 1 tablet (325 mg total) by mouth 2 (two) times daily after a meal. 09/11/18   Loni Dolly, PA-C  atorvastatin (LIPITOR) 40 MG tablet Take 1 tablet (40 mg total) by mouth daily at 6 PM. 03/20/18   Elby Beck, FNP  Cholecalciferol (VITAMIN D) 50 MCG (2000 UT) tablet Take 2,000 Units by mouth every other day.    [provider]  glucose blood (ACCU-CHEK AVIVA PLUS) test strip Use as instructed 03/20/18   Elby Beck, FNP  HYDROcodone-acetaminophen (NORCO/VICODIN) 5-325 MG tablet Take 1-2 tablets by mouth every 6 (six) hours as needed for moderate pain (pain score 4-6). 09/11/18   Loni Dolly, PA-C  levothyroxine (SYNTHROID,  LEVOTHROID) 100 MCG tablet Take 1 tablet (100 mcg total) by mouth daily. 03/20/18   Elby Beck, FNP  metFORMIN (GLUCOPHAGE) 500 MG tablet Take 1 tablet (500 mg total) by mouth 2 (two) times daily with a meal. 12/16/18   Elby Beck, FNP  Multiple Vitamins-Minerals (PRESERVISION AREDS 2 PO) Take 1 tablet by mouth 2 (two) times daily.     [provider]  NITROGLYCERIN PO Take 0.4 mg by mouth every 5 (five) minutes as needed (chest pain).     [provider]  ramipril (ALTACE) 10 MG capsule Take 1 capsule (10 mg total) by mouth daily. 03/20/18   Elby Beck, FNP  tiZANidine (ZANAFLEX) 2 MG tablet Take 1-2 tablets (2-4 mg total) by mouth every 6 (six) hours as needed for muscle spasms. 09/11/18 09/11/19  Loni Dolly, PA-C     Current Outpatient Medications  Medication Instructions  . aspirin EC 81 mg, Oral, Daily  . atorvastatin (LIPITOR) 40 mg, Oral, Daily-1800  . glucose blood (ACCU-CHEK AVIVA PLUS) test strip Use as instructed  . levothyroxine (SYNTHROID) 100 mcg, Oral, Daily  . metFORMIN (GLUCOPHAGE) 500 mg, Oral, 2 times daily with meals  . Multiple Vitamins-Minerals (PRESERVISION AREDS 2 PO) 1 tablet, Oral, 2 times daily  . NITROGLYCERIN PO 0.4 mg, Oral, Every 5 min PRN  . ramipril (ALTACE) 10 mg, Oral, Daily  . VITAMIN D PO 1,000 Units, Oral, Daily    Cardiac Studies:   Echocardiogram 04/03/2019:  Normal LV systolic function with visual EF 60-65%. Left ventricle cavity is normal in size. Normal global wall motion. Unable  to evaluate diastolic function due to mitral apparatus calcification.  Appears to suggest Grade II diastolic dysfunction.  Calculated EF 77%. Left atrial cavity is moderately dilated by volume. Right atrial cavity is mild to moderately dilated. Trileaflet aortic valve. Mild to moderate aortic regurgitation. Moderate aortic valve leaflet calcification. Mild aortic valve stenosis. Aortic valve peak pressure gradient of 33.6 and mean  gradient of 20 mmHg, calculated aortic valve area 1.1 cm. Moderate calcification of the mitral valve annulus. Moderate posterior mitral valve leaflet calcification. Mild (Grade I) mitral regurgitation. Structurally normal tricuspid valve. Mild to moderate tricuspid regurgitation. Mild pulmonary hypertension. Estimated pulmonary artery systolic pressure is 39 mm Hg with RA pressure estimated at 3 mm Hg. RVSP measures 39 mmHg. No significant change from 04/07/2015 report in AV gradients.   Carotid artery duplex  04/03/19  No hemodynamically significant arterial disease in the internal carotid artery bilaterally.  Antegrade right vertebral artery flow. Antegrade left vertebral artery flow. Compared to 04/01/2018, right ICA stenosis of 15-49% not present.    Assessment     ICD-10-CM   1. Aortic stenosis, moderate  I35.0 EKG 12-Lead  2. Moderate aortic regurgitation  I35.1 EKG 12-Lead  3. Carotid atherosclerosis, bilateral  I65.23   4. Essential hypertension  I10   5. Mixed hyperlipidemia  E78.2     EKG 03/07/2018: Normal sinus rhythm at rate of 68 bpm, normal axis, no evidence of ischemia. Single PAC. Normal EKG.  Recommendations:   Patient presents here for annual visit and follow-up of moderate aortic regurgitation and mild to moderate aortic, she remained stable from cardiac standpoint without evidence of heart failure or angina pectoris.  Continue observation, no changes were done with medications.  She does have mild carotid atherosclerosis, previously noted right ICA stenosis is no longer present.  She is on a statin, continue the same.  Lipids are controlled except mild triglyceride elevation is related to her diet, this was discussed with the patient.  Blood pressure is well controlled.  She is on appropriate medical therapy.  I'll see her back in a year.  Toniann FailAshton Haynes , MSN, APRN, FNP-C [facility] Office: [phone] Fax: [phone]

## 2019-04-16 ENCOUNTER — Other Ambulatory Visit: Payer: Self-pay | Admitting: Family Medicine

## 2019-04-16 DIAGNOSIS — E119 Type 2 diabetes mellitus without complications: Secondary | ICD-10-CM

## 2019-04-16 DIAGNOSIS — E785 Hyperlipidemia, unspecified: Secondary | ICD-10-CM

## 2019-04-17 NOTE — Telephone Encounter (Signed)
Best number [phone] Pt calling to check on rx's She would like to pick these up in the morning she will be near that area  Please advise when called

## 2019-04-18 NOTE — Telephone Encounter (Signed)
Pt advised. Nothing further needed.   

## 2019-05-26 ENCOUNTER — Encounter: Payer: Self-pay | Admitting: Family Medicine

## 2019-05-26 NOTE — Progress Notes (Signed)
Duplicate report

## 2019-06-03 LAB — HM DIABETES EYE EXAM

## 2019-06-10 ENCOUNTER — Other Ambulatory Visit: Payer: Self-pay | Admitting: Family Medicine

## 2019-06-10 DIAGNOSIS — E039 Hypothyroidism, unspecified: Secondary | ICD-10-CM

## 2019-06-17 ENCOUNTER — Encounter: Payer: Self-pay | Admitting: Family Medicine

## 2019-06-17 ENCOUNTER — Telehealth: Payer: Self-pay

## 2019-06-17 ENCOUNTER — Ambulatory Visit (INDEPENDENT_AMBULATORY_CARE_PROVIDER_SITE_OTHER): Payer: Medicare Other | Admitting: Family Medicine

## 2019-06-17 DIAGNOSIS — Z20828 Contact with and (suspected) exposure to other viral communicable diseases: Secondary | ICD-10-CM | POA: Insufficient documentation

## 2019-06-17 DIAGNOSIS — Z20822 Contact with and (suspected) exposure to covid-19: Secondary | ICD-10-CM

## 2019-06-17 NOTE — Telephone Encounter (Signed)
pts husband dx with covid on 06/17/19.  pt having no symptoms.  Pt scheduled virtual appt with

## 2019-06-17 NOTE — Progress Notes (Signed)
Virtual Visit via Telephone Note  I connected with Clifton B Nuon on 06/17/19 at 11:30 AM EST by telephone and verified that I am speaking with the correct person using two identifiers.  Location:  Patient: home Provider: office   Parties involved in encounter  Patient  Dana Sandoval Treating physician   Roxy MannsMarne Tower MD    I discussed the limitations, risks, security and privacy concerns of performing an evaluation and management service by telephone and the availability of in person appointments. I also discussed with the patient that there may be a patient responsible charge related to this service. The patient expressed understanding and agreed to proceed.   History of Present Illness: Pt presents with hx of exposure to covid   Her husband tested positive on 06/16/19   (routine screen in prep for colonoscopy)  He has no symptoms at all -feels fine   She has no symptoms at all  She does not go out  She picks up groceries in parking lot at [employer]   She has had grand kids (one great grand child) over-they stay outside  When outdoors with them -do not mask   Patient Active Problem List   Diagnosis Date Noted  . Exposure to COVID-19 virus 06/17/2019  . Primary osteoarthritis of left knee 09/10/2018  . Acquired hypothyroidism 03/22/2018  . Ovarian cyst, left 07/09/2014  . Weight gain 03/18/2012  . Family history of osteoporosis 03/18/2012  . Aortic stenosis, moderate 03/20/2011  . Dyslipidemia 03/20/2011  . Controlled type 2 diabetes mellitus without complication, without long-term current use of insulin (HCC) 03/20/2011  . Osteopenia of the elderly 03/17/2011   Past Medical History:  Diagnosis Date  . Aortic stenosis   . Arthritis   . Diabetes mellitus    type 2  . Heart murmur    aortic stenosis  . Hyperlipidemia   . Hypothyroidism   . IBS (irritable bowel syndrome)   . NSVD (normal spontaneous vaginal delivery)    X 4  . Pain    INTRASCAPULAR PAIN  . SAB  (spontaneous abortion)    X 2  . Thyroid disease    HYPOTHYROIDISM   Past Surgical History:  Procedure Laterality Date  . ABDOMINAL HYSTERECTOMY  1977   TAH  (FIBROIDS)  . DILATION AND CURETTAGE OF UTERUS    . DOPPLER ECHOCARDIOGRAPHY  2009   HAD AN ECHOCARDIOGRAM THAT SHOWED MODERATE MITRAL  ANNULAR CALCIFICATION AND A NORMAL EJECTION FRACTION OF 55%  . OTHER SURGICAL HISTORY     HYSTERECTOMY  . SHOULDER SURGERY     RIGHT SHOULDER SURGERY  . THYROIDECTOMY    . 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 TOTAL KNEE ARTHROPLASTY Left 09/10/2018   Procedure: TOTAL KNEE ARTHROPLASTY;  Surgeon: Marcene Corningalldorf, Peter, MD;  Location: [facility];  Service: Orthopedics;  Laterality: Left;   Social History   Tobacco Use  . Smoking status: Never Smoker  . Smokeless tobacco: Never Used  Substance Use Topics  . Alcohol use: No    Alcohol/week: 0.0 standard drinks  . Drug use: No   Family History  Problem Relation Age of Onset  . Osteoporosis Mother   . Heart disease Mother   . Heart disease Father   . Cancer Father        BRAIN TUMOR  . Diabetes Maternal Aunt   . Diabetes Maternal Uncle   . Cancer Brother        BRAIN TUMOR   Allergies  Allergen Reactions  .  Codeine Itching    After 3 days  . Dilaudid [Hydromorphone Hcl] Itching    After 3 days   Current Outpatient Medications on File Prior to Visit  Medication Sig Dispense Refill  . aspirin EC 81 MG tablet Take 81 mg by mouth daily.    Marland Kitchen atorvastatin (LIPITOR) 40 MG tablet TAKE 1 TABLET BY MOUTH ONCE DAILY AT  6PM 90 tablet 0  . glucose blood (ACCU-CHEK AVIVA PLUS) test strip Use as instructed 100 each 12  . levothyroxine (SYNTHROID) 100 MCG tablet Take 1 tablet by mouth once daily 90 tablet 3  . metFORMIN (GLUCOPHAGE) 500 MG tablet Take 1 tablet (500 mg total) by mouth 2 (two) times daily with a meal. 180 tablet 3  . Multiple Vitamins-Minerals (PRESERVISION AREDS 2 PO) Take 1 tablet by mouth 2 (two) times daily.     Marland Kitchen  NITROGLYCERIN PO Take 0.4 mg by mouth every 5 (five) minutes as needed (chest pain).     . ramipril (ALTACE) 10 MG capsule Take 1 capsule by mouth once daily 90 capsule 0  . VITAMIN D PO Take 1,000 Units by mouth daily at 2 PM.     No current facility-administered medications on file prior to visit.     Review of Systems  Constitutional: Negative for chills, fever and malaise/fatigue.  HENT: Negative for congestion, ear pain, sinus pain and sore throat.   Eyes: Negative for blurred vision, discharge and redness.  Respiratory: Negative for cough, shortness of breath and stridor.   Cardiovascular: Negative for chest pain, palpitations and leg swelling.  Gastrointestinal: Negative for abdominal pain, diarrhea, nausea and vomiting.  Musculoskeletal: Negative for myalgias and neck pain.  Skin: Negative for rash.  Neurological: Negative for dizziness and headaches.    Observations/Objective: The patient sounds well and in no distress Voice is not hoarse, speech sounds normal  No cough /sniffling/throat clearing or sob noted during interview Nl mood (good) Nl cognition (good historian)   Assessment and Plan: Problem List Items Addressed This Visit      Other   Exposure to COVID-19 virus    Pt's husband was diagnosed yesterday (screening for upcoming procedure and no symptoms at all)  She herself also has no symptoms  inst to either quarantine form 14 days (or if he gets symptoms 14 days after her recovers)  Or  Get tested about 5 days after expected exposure  She will likely go ahead and get tested Friday or Monday at the [facility] site  She herself also has no symptoms Counseled on isolation of husband w/in the home as well as masking  Hand washing/sanitation discussed  Inst to watch for symptoms including nasal symptoms, cough, sob, loss of taste or smell, ST, or any GI symptoms  inst to call us if she develops any  She will also continue to check her temperature regularly            Follow Up Instructions: If you develop any symptoms of covid -19 please let us know  This includes nasal symptoms, sore throat, cough, fever, aches or chills, fatigue, loss of taste or smell, abdominal pain or nausea/vomiting or diarrhea   If no symptoms at all you can quarantine for 14 days past first exposure or get tested for covid after 5-7 days post exposure   ([place] site is fine-they test from Monday through Friday from 10 am to 3 pm)    You can also call [facility] or the health department to see  what their testing schedule is   Please keep [place] posted    I discussed the assessment and treatment plan with the patient. The patient was provided an opportunity to ask questions and all were answered. The patient agreed with the plan and demonstrated an understanding of the instructions.   The patient was advised to call back or seek an in-person evaluation if the symptoms worsen or if the condition fails to improve as anticipated.  I provided 12 minutes of non-face-to-face time during this encounter.   Loura Pardon, MD

## 2019-06-17 NOTE — Telephone Encounter (Signed)
I will see her then  

## 2019-06-17 NOTE — Assessment & Plan Note (Signed)
Pt's husband was diagnosed yesterday (screening for upcoming procedure and no symptoms at all)  She herself also has no symptoms  inst to either quarantine form 14 days (or if he gets symptoms 14 days after her recovers)  Or  Get tested about 5 days after expected exposure  She will likely go ahead and get tested Friday or Monday at the [facility] site  She herself also has no symptoms Counseled on isolation of husband w/in the home as well as masking  Hand washing/sanitation discussed  Inst to watch for symptoms including nasal symptoms, cough, sob, loss of taste or smell, ST, or any GI symptoms  inst to call us if she develops any  She will also continue to check her temperature regularly

## 2019-06-17 NOTE — Patient Instructions (Signed)
If you develop any symptoms of covid -19 please let us know  This includes nasal symptoms, sore throat, cough, fever, aches or chills, fatigue, loss of taste or smell, abdominal pain or nausea/vomiting or diarrhea   If no symptoms at all you can quarantine for 14 days past first exposure or get tested for covid after 5-7 days post exposure   ([place] site is fine-they test from Monday through Friday from 10 am to 3 pm)    You can also call [facility] or the health department to see what their testing schedule is   Please keep [place] posted

## 2019-06-17 NOTE — Telephone Encounter (Signed)
Pt's husband dx with + covid 06/17/19. Pt has no symptoms. Pt scheduled phone visit today at 11:30 with Dr Glori Bickers.pt is self quarantining and wants to know if can get referral for covid testing.UC & ED precautions given and pt voiced understanding. FYI to Dr Glori Bickers.

## 2019-06-23 ENCOUNTER — Telehealth: Payer: Self-pay

## 2019-06-23 NOTE — Telephone Encounter (Signed)
Patient left message earlier wanting a call back to give test result on virus.   I called patient back but was not able to leave a message. Need to try later again.

## 2019-06-26 ENCOUNTER — Encounter: Payer: Medicare Other | Admitting: Obstetrics & Gynecology

## 2019-06-26 NOTE — Telephone Encounter (Signed)
FYI,  Pt states that her test results came back POSITIVE Covid   Pt reports a 99 temp and 100 temp on two separate days over the last week - no temp since Sunday 12/13. Pt has not had any other symptoms and does not feels bad.   ----  Pt states that she had to cancel her OBGYN appt for this week due to having Covid and they were not able to reschedule her until march 2021. Pt states that they told her she needed a Pap and pelvic exam this year -- pt is wanting to know if this is still necessary at her age?? -Pt had hysterectomy in 1977 -No family hx of cervical cancer  Pt is asking if you would be able to do this for her IF she needs it done so that she doesn't have to wait until March?

## 2019-06-26 NOTE — Telephone Encounter (Signed)
Please call the patient and tell her that she no longer needs screening for cervical cancer. If she is having any vaginal concerns, I am happy to see her in the office and evaluate her.

## 2019-06-27 NOTE — Telephone Encounter (Signed)
ATC busy signal - will call back

## 2019-07-01 NOTE — Telephone Encounter (Signed)
Pt aware. Nothing further needed 

## 2019-07-02 ENCOUNTER — Encounter: Payer: Self-pay | Admitting: Family Medicine

## 2019-07-14 ENCOUNTER — Other Ambulatory Visit: Payer: Self-pay | Admitting: Family Medicine

## 2019-07-14 DIAGNOSIS — E785 Hyperlipidemia, unspecified: Secondary | ICD-10-CM

## 2019-07-14 DIAGNOSIS — E119 Type 2 diabetes mellitus without complications: Secondary | ICD-10-CM

## 2019-07-17 ENCOUNTER — Other Ambulatory Visit: Payer: Self-pay | Admitting: Family Medicine

## 2019-07-17 DIAGNOSIS — E119 Type 2 diabetes mellitus without complications: Secondary | ICD-10-CM

## 2019-08-01 ENCOUNTER — Other Ambulatory Visit: Payer: Self-pay

## 2019-08-01 ENCOUNTER — Other Ambulatory Visit (INDEPENDENT_AMBULATORY_CARE_PROVIDER_SITE_OTHER): Payer: Medicare PPO

## 2019-08-01 DIAGNOSIS — E119 Type 2 diabetes mellitus without complications: Secondary | ICD-10-CM | POA: Diagnosis not present

## 2019-08-01 LAB — HEMOGLOBIN A1C: Hgb A1c MFr Bld: 6.2 % (ref 4.6–6.5)

## 2019-08-25 ENCOUNTER — Telehealth: Payer: Self-pay | Admitting: Family Medicine

## 2019-08-25 NOTE — Telephone Encounter (Signed)
Error

## 2019-09-05 ENCOUNTER — Telehealth: Payer: Self-pay

## 2019-09-05 NOTE — Telephone Encounter (Signed)
Fax from [facility] asking if it is ok to change manufacturer for Levothyroxine medication.

## 2019-09-05 NOTE — Telephone Encounter (Signed)
That is fine 

## 2019-09-08 NOTE — Telephone Encounter (Signed)
Pt aware paperwork faxed

## 2019-09-08 NOTE — Telephone Encounter (Signed)
Called pharmacy to advised but was put on hold for a while. Sent fax with approval to the pharmacy instead.

## 2019-09-12 ENCOUNTER — Encounter: Payer: Medicare Other | Admitting: Obstetrics & Gynecology

## 2019-11-20 ENCOUNTER — Other Ambulatory Visit: Payer: Self-pay | Admitting: Family Medicine

## 2019-11-20 DIAGNOSIS — E785 Hyperlipidemia, unspecified: Secondary | ICD-10-CM

## 2019-11-20 DIAGNOSIS — E119 Type 2 diabetes mellitus without complications: Secondary | ICD-10-CM

## 2020-01-30 ENCOUNTER — Encounter: Payer: Medicare Other | Admitting: Family Medicine

## 2020-01-30 ENCOUNTER — Ambulatory Visit: Payer: Medicare Other

## 2020-02-05 ENCOUNTER — Other Ambulatory Visit: Payer: Self-pay

## 2020-02-05 ENCOUNTER — Ambulatory Visit (INDEPENDENT_AMBULATORY_CARE_PROVIDER_SITE_OTHER): Payer: Medicare PPO

## 2020-02-05 DIAGNOSIS — Z0000 Encounter for general adult medical examination without abnormal findings: Secondary | ICD-10-CM

## 2020-02-05 NOTE — Patient Instructions (Signed)
Dana Sandoval , Thank you for taking time to come for your Medicare Wellness Visit. I appreciate your ongoing commitment to your health goals. Please review the following plan we discussed and let me know if I can assist you in the future.   Screening recommendations/referrals: Colonoscopy: no longer required Mammogram: Up to date, completed 03/31/2019, due 02/2020 Bone Density: Up to date, completed 04/07/2019, due 03/2021 Recommended yearly ophthalmology/optometry visit for glaucoma screening and checkup Recommended yearly dental visit for hygiene and checkup  Vaccinations: Influenza vaccine: Up to date, completed 06/03/2019, due 02/2020 Pneumococcal vaccine: Completed series Tdap vaccine: Up to date, completed 07/09/2012, due 06/2022 Shingles vaccine: due, check with your insurance regarding coverage    Covid-19:Completed series  Advanced directives: Please bring a copy of your POA (Power of Attorney) and/or Living Will to your next appointment.   Conditions/risks identified: Diabetes, hyperlipidemia  Next appointment: Follow up in one year for your annual wellness visit    Preventive Care 65 Years and Older, Female Preventive care refers to lifestyle choices and visits with your health care provider that can promote health and wellness. What does preventive care include?  A yearly physical exam. This is also called an annual well check.  Dental exams once or twice a year.  Routine eye exams. Ask your health care provider how often you should have your eyes checked.  Personal lifestyle choices, including:  Daily care of your teeth and gums.  Regular physical activity.  Eating a healthy diet.  Avoiding tobacco and drug use.  Limiting alcohol use.  Practicing safe sex.  Taking low-dose aspirin every day.  Taking vitamin and mineral supplements as recommended by your health care provider. What happens during an annual well check? The services and screenings done by your  health care provider during your annual well check will depend on your age, overall health, lifestyle risk factors, and family history of disease. Counseling  Your health care provider may ask you questions about your:  Alcohol use.  Tobacco use.  Drug use.  Emotional well-being.  Home and relationship well-being.  Sexual activity.  Eating habits.  History of falls.  Memory and ability to understand (cognition).  Work and work Astronomer.  Reproductive health. Screening  You may have the following tests or measurements:  Height, weight, and BMI.  Blood pressure.  Lipid and cholesterol levels. These may be checked every 5 years, or more frequently if you are over 64 years old.  Skin check.  Lung cancer screening. You may have this screening every year starting at age 85 if you have a 30-pack-year history of smoking and currently smoke or have quit within the past 15 years.  Fecal occult blood test (FOBT) of the stool. You may have this test every year starting at age 43.  Flexible sigmoidoscopy or colonoscopy. You may have a sigmoidoscopy every 5 years or a colonoscopy every 10 years starting at age 76.  Hepatitis C blood test.  Hepatitis B blood test.  Sexually transmitted disease (STD) testing.  Diabetes screening. This is done by checking your blood sugar (glucose) after you have not eaten for a while (fasting). You may have this done every 1-3 years.  Bone density scan. This is done to screen for osteoporosis. You may have this done starting at age 7.  Mammogram. This may be done every 1-2 years. Talk to your health care provider about how often you should have regular mammograms. Talk with your health care provider about your test results, treatment  options, and if necessary, the need for more tests. Vaccines  Your health care provider may recommend certain vaccines, such as:  Influenza vaccine. This is recommended every year.  Tetanus, diphtheria, and  acellular pertussis (Tdap, Td) vaccine. You may need a Td booster every 10 years.  Zoster vaccine. You may need this after age 35.  Pneumococcal 13-valent conjugate (PCV13) vaccine. One dose is recommended after age 64.  Pneumococcal polysaccharide (PPSV23) vaccine. One dose is recommended after age 9. Talk to your health care provider about which screenings and vaccines you need and how often you need them. This information is not intended to replace advice given to you by your health care provider. Make sure you discuss any questions you have with your health care provider. Document Released: 07/23/2015 Document Revised: 03/15/2016 Document Reviewed: 04/27/2015 Elsevier Interactive Patient Education  2017 [place] Prevention in the Home Falls can cause injuries. They can happen to people of all ages. There are many things you can do to make your home safe and to help prevent falls. What can I do on the outside of my home?  Regularly fix the edges of walkways and driveways and fix any cracks.  Remove anything that might make you trip as you walk through a door, such as a raised step or threshold.  Trim any bushes or trees on the path to your home.  Use bright outdoor lighting.  Clear any walking paths of anything that might make someone trip, such as rocks or tools.  Regularly check to see if handrails are loose or broken. Make sure that both sides of any steps have handrails.  Any raised decks and porches should have guardrails on the edges.  Have any leaves, snow, or ice cleared regularly.  Use sand or salt on walking paths during winter.  Clean up any spills in your garage right away. This includes oil or grease spills. What can I do in the bathroom?  Use night lights.  Install grab bars by the toilet and in the tub and shower. Do not use towel bars as grab bars.  Use non-skid mats or decals in the tub or shower.  If you need to sit down in the shower, use  a plastic, non-slip stool.  Keep the floor dry. Clean up any water that spills on the floor as soon as it happens.  Remove soap buildup in the tub or shower regularly.  Attach bath mats securely with double-sided non-slip rug tape.  Do not have throw rugs and other things on the floor that can make you trip. What can I do in the bedroom?  Use night lights.  Make sure that you have a light by your bed that is easy to reach.  Do not use any sheets or blankets that are too big for your bed. They should not hang down onto the floor.  Have a firm chair that has side arms. You can use this for support while you get dressed.  Do not have throw rugs and other things on the floor that can make you trip. What can I do in the kitchen?  Clean up any spills right away.  Avoid walking on wet floors.  Keep items that you use a lot in easy-to-reach places.  If you need to reach something above you, use a strong step stool that has a grab bar.  Keep electrical cords out of the way.  Do not use floor polish or wax that makes floors slippery.  If you must use wax, use non-skid floor wax.  Do not have throw rugs and other things on the floor that can make you trip. What can I do with my stairs?  Do not leave any items on the stairs.  Make sure that there are handrails on both sides of the stairs and use them. Fix handrails that are broken or loose. Make sure that handrails are as long as the stairways.  Check any carpeting to make sure that it is firmly attached to the stairs. Fix any carpet that is loose or worn.  Avoid having throw rugs at the top or bottom of the stairs. If you do have throw rugs, attach them to the floor with carpet tape.  Make sure that you have a light switch at the top of the stairs and the bottom of the stairs. If you do not have them, ask someone to add them for you. What else can I do to help prevent falls?  Wear shoes that:  Do not have high heels.  Have  rubber bottoms.  Are comfortable and fit you well.  Are closed at the toe. Do not wear sandals.  If you use a stepladder:  Make sure that it is fully opened. Do not climb a closed stepladder.  Make sure that both sides of the stepladder are locked into place.  Ask someone to hold it for you, if possible.  Clearly mark and make sure that you can see:  Any grab bars or handrails.  First and last steps.  Where the edge of each step is.  Use tools that help you move around (mobility aids) if they are needed. These include:  Canes.  Walkers.  Scooters.  Crutches.  Turn on the lights when you go into a dark area. Replace any light bulbs as soon as they burn out.  Set up your furniture so you have a clear path. Avoid moving your furniture around.  If any of your floors are uneven, fix them.  If there are any pets around you, be aware of where they are.  Review your medicines with your doctor. Some medicines can make you feel dizzy. This can increase your chance of falling. Ask your doctor what other things that you can do to help prevent falls. This information is not intended to replace advice given to you by your health care provider. Make sure you discuss any questions you have with your health care provider. Document Released: 04/22/2009 Document Revised: 12/02/2015 Document Reviewed: 07/31/2014 Elsevier Interactive Patient Education  2017 [employer].

## 2020-02-05 NOTE — Progress Notes (Signed)
PCP notes:  Health Maintenance: Foot exam- due   Abnormal Screenings: none   Patient concerns: none   Nurse concerns: none   Next PCP appt.: 03/22/2020 @ 2:30 pm

## 2020-02-05 NOTE — Progress Notes (Signed)
Subjective:   Dana Sandoval is a 79 y.o. female who presents for Medicare Annual (Subsequent) preventive examination.  Review of Systems : N/A     I connected with the patient today by telephone and verified that I am speaking with the correct person using two identifiers. Location patient: home Location nurse: work Persons participating in the virtual visit: patient, Engineer, civil (consulting).   I discussed the limitations, risks, security and privacy concerns of performing an evaluation and management service by telephone and the availability of in person appointments. I also discussed with the patient that there may be a patient responsible charge related to this service. The patient expressed understanding and verbally consented to this telephonic visit.    Interactive audio and video telecommunications were attempted between this nurse and patient, however failed, due to patient having technical difficulties OR patient did not have access to video capability.  We continued and completed visit with audio only.     Cardiac Risk Factors include: advanced age (>37men, >11 women);diabetes mellitus;dyslipidemia     Objective:    Today's Vitals   There is no height or weight on file to calculate BMI.  Advanced Directives 02/05/2020 09/10/2018 09/02/2018  Does Patient Have a Medical Advance Directive? Yes Yes Yes  Type of Estate agent of [place];Living will Healthcare Power of [place];Living will Healthcare Power of [place];Living will  Does patient want to make changes to medical advance directive? - No - Patient declined No - Patient declined  Copy of Healthcare Power of Attorney in Chart? No - copy requested No - copy requested -    Current Medications (verified) Outpatient Encounter Medications as of 02/05/2020  Medication Sig  . ACCU-CHEK AVIVA PLUS test strip USE AS DIRECTED  . aspirin EC 81 MG tablet Take 81 mg by mouth daily.  Marland Kitchen atorvastatin (LIPITOR) 40 MG tablet  TAKE 1 TABLET BY MOUTH ONCE DAILY AT  6PM  . levothyroxine (SYNTHROID) 100 MCG tablet Take 1 tablet by mouth once daily  . metFORMIN (GLUCOPHAGE) 500 MG tablet TAKE 1 TABLET BY MOUTH TWICE DAILY WITH MEALS  . Multiple Vitamins-Minerals (PRESERVISION AREDS 2 PO) Take 1 tablet by mouth 2 (two) times daily.   Marland Kitchen NITROGLYCERIN PO Take 0.4 mg by mouth every 5 (five) minutes as needed (chest pain).   . ramipril (ALTACE) 10 MG capsule Take 1 capsule by mouth once daily  . VITAMIN D PO Take 1,000 Units by mouth daily at 2 PM.   No facility-administered encounter medications on file as of 02/05/2020.    Allergies (verified) Codeine and Dilaudid [hydromorphone hcl]   History: Past Medical History:  Diagnosis Date  . Aortic stenosis   . Arthritis   . Diabetes mellitus    type 2  . Heart murmur    aortic stenosis  . Hyperlipidemia   . Hypothyroidism   . IBS (irritable bowel syndrome)   . NSVD (normal spontaneous vaginal delivery)    X 4  . Pain    INTRASCAPULAR PAIN  . SAB (spontaneous abortion)    X 2  . Thyroid disease    HYPOTHYROIDISM   Past Surgical History:  Procedure Laterality Date  . ABDOMINAL HYSTERECTOMY  1977   TAH  (FIBROIDS)  . DILATION AND CURETTAGE OF UTERUS    . DOPPLER ECHOCARDIOGRAPHY  2009   HAD AN ECHOCARDIOGRAM THAT SHOWED MODERATE MITRAL  ANNULAR CALCIFICATION AND A NORMAL EJECTION FRACTION OF 55%  . OTHER SURGICAL HISTORY     HYSTERECTOMY  .  SHOULDER SURGERY     RIGHT SHOULDER SURGERY  . THYROIDECTOMY    . 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 TOTAL KNEE ARTHROPLASTY Left 09/10/2018   Procedure: TOTAL KNEE ARTHROPLASTY;  Surgeon: Marcene Corning, MD;  Location: [facility];  Service: Orthopedics;  Laterality: Left;   Family History  Problem Relation Age of Onset  . Osteoporosis Mother   . Heart disease Mother   . Heart disease Father   . Cancer Father        BRAIN TUMOR  . Diabetes Maternal Aunt   . Diabetes Maternal Uncle   . Cancer Brother          BRAIN TUMOR   Social History   Socioeconomic History  . Marital status: Married    Spouse name: Not on file  . Number of children: 4  . Years of education: Not on file  . Highest education level: Not on file  Occupational History  . Not on file  Tobacco Use  . Smoking status: Never Smoker  . Smokeless tobacco: Never Used  Vaping Use  . Vaping Use: Never used  Substance and Sexual Activity  . Alcohol use: No    Alcohol/week: 0.0 standard drinks  . Drug use: No  . Sexual activity: Yes    Partners: Male    Birth control/protection: Surgical    Comment: 1st intercourse- 83, partners- 1, married- 57 yrs   Other Topics Concern  . Not on file  Social History Narrative  . Not on file   Social Determinants of Health   Financial Resource Strain: Low Risk   . Difficulty of Paying Living Expenses: Not hard at all  Food Insecurity: No Food Insecurity  . Worried About Programme researcher, broadcasting/film/video in the Last Year: Never true  . Ran Out of Food in the Last Year: Never true  Transportation Needs: No Transportation Needs  . Lack of Transportation (Medical): No  . Lack of Transportation (Non-Medical): No  Physical Activity: Inactive  . Days of Exercise per Week: 0 days  . Minutes of Exercise per Session: 0 min  Stress: No Stress Concern Present  . Feeling of Stress : Not at all  Social Connections:   . Frequency of Communication with Friends and Family:   . Frequency of Social Gatherings with Friends and Family:   . Attends Religious Services:   . Active Member of Clubs or Organizations:   . Attends Banker Meetings:   Marland Kitchen Marital Status:     Tobacco Counseling Counseling given: Not Answered   Clinical Intake:  Pre-visit preparation completed: Yes  Pain : No/denies pain     Nutritional Risks: None Diabetes: Yes CBG done?: No Did pt. bring in CBG monitor from home?: No  How often do you need to have someone help you when you read instructions, pamphlets,  or other written materials from your doctor or pharmacy?: 1 - Never What is the last grade level you completed in school?: 1 year of college  Diabetic: Yes Nutrition Risk Assessment:  Has the patient had any N/V/D within the last 2 months?  No  Does the patient have any non-healing wounds?  No  Has the patient had any unintentional weight loss or weight gain?  No   Diabetes:  Is the patient diabetic?  Yes  If diabetic, was a CBG obtained today?  No  Did the patient bring in their glucometer from home?  No  How often do you monitor your  CBG's? Twice a week.   Financial Strains and Diabetes Management:  Are you having any financial strains with the device, your supplies or your medication? No .  Does the patient want to be seen by Chronic Care Management for management of their diabetes?  No  Would the patient like to be referred to a Nutritionist or for Diabetic Management?  No   Diabetic Exams:  Diabetic Eye Exam: Completed 06/03/2019 Diabetic Foot Exam: Overdue, Pt has been advised about the importance in completing this exam. Pt is scheduled for diabetic foot exam on 03/22/2020.   Interpreter Needed?: No  Information entered by :: CJohnson, LPN   Activities of Daily Living In your present state of health, do you have any difficulty performing the following activities: 02/05/2020  Hearing? N  Vision? N  Difficulty concentrating or making decisions? N  Walking or climbing stairs? N  Dressing or bathing? N  Doing errands, shopping? N  Preparing Food and eating ? N  Using the Toilet? N  In the past six months, have you accidently leaked urine? N  Do you have problems with loss of bowel control? N  Managing your Medications? N  Managing your Finances? N  Housekeeping or managing your Housekeeping? N  Some recent data might be hidden    Patient Care Team: Emi Belfast, FNP as PCP - General (Nurse Practitioner)  Indicate any recent Medical Services you may have  received from other than [facility] providers in the past year (date may be approximate).     Assessment:   This is a routine wellness examination for [place].  Hearing/Vision screen  Hearing Screening   125Hz  250Hz  500Hz  1000Hz  2000Hz  3000Hz  4000Hz  6000Hz  8000Hz   Right ear:           Left ear:           Vision Screening Comments: Patient gets annual eye exams.  Dietary issues and exercise activities discussed: Current Exercise Habits: The patient does not participate in regular exercise at present, Exercise limited by: None identified  Goals    . Patient Stated     02/05/2020, I will maintain and continue medications as prescribed.       Depression Screen PHQ 2/9 Scores 02/05/2020 01/29/2019 03/20/2018  PHQ - 2 Score 0 0 0  PHQ- 9 Score 0 - -    Fall Risk Fall Risk  02/05/2020 01/29/2019 03/20/2018  Falls in the past year? 0 0 No  Number falls in past yr: 0 - -  Injury with Fall? 0 - -  Risk for fall due to : Medication side effect - -  Follow up Falls evaluation completed;Falls prevention discussed - -    Any stairs in or around the home? Yes  If so, are there any without handrails? No  Home free of loose throw rugs in walkways, pet beds, electrical cords, etc? Yes  Adequate lighting in your home to reduce risk of falls? Yes   ASSISTIVE DEVICES UTILIZED TO PREVENT FALLS:  Life alert? No  Use of a cane, walker or w/c? No  Grab bars in the bathroom? No  Shower chair or bench in shower? No  Elevated toilet seat or a handicapped toilet? No   TIMED UP AND GO:  Was the test performed? N/A, telephonic visit.    Cognitive Function: MMSE - Mini Mental State Exam 02/05/2020  Orientation to time 5  Orientation to Place 5  Registration 3  Attention/ Calculation 5  Recall 3  Language- repeat 1  Mini Cog  Mini-Cog screen was completed. Maximum score is 22. A value of 0 denotes this part of the MMSE was not completed or the patient failed this part of the Mini-Cog  screening.  Immunizations Immunization History  Administered Date(s) Administered  . Fluad Quad(high Dose 65+) 05/07/2019, 06/03/2019  . Influenza, High Dose Seasonal PF 03/20/2018  . Moderna SARS-COVID-2 Vaccination 10/07/2019, 11/04/2019  . Pneumococcal Conjugate-13 04/22/2017  . Pneumococcal Polysaccharide-23 04/10/2008, 04/22/2018  . Tdap 07/09/2012    TDAP status: Up to date Flu Vaccine status: Up to date Pneumococcal vaccine status: Up to date Covid-19 vaccine status: Completed vaccines  Qualifies for Shingles Vaccine? Yes   Zostavax completed No   Shingrix Completed?: No.    Education has been provided regarding the importance of this vaccine. Patient has been advised to call insurance company to determine out of pocket expense if they have not yet received this vaccine. Advised may also receive vaccine at local pharmacy or Health Dept. Verbalized acceptance and understanding.  Screening Tests Health Maintenance  Topic Date Due  . FOOT EXAM  07/23/2019  . HEMOGLOBIN A1C  01/29/2020  . INFLUENZA VACCINE  02/08/2020  . MAMMOGRAM  03/30/2020  . OPHTHALMOLOGY EXAM  06/02/2020  . TETANUS/TDAP  07/09/2022  . DEXA SCAN  Completed  . COVID-19 Vaccine  Completed  . PNA vac Low Risk Adult  Completed  . PAP SMEAR-Modifier  Discontinued    Health Maintenance  Health Maintenance Due  Topic Date Due  . FOOT EXAM  07/23/2019  . HEMOGLOBIN A1C  01/29/2020    Colorectal cancer screening: No longer required.  Mammogram status: Completed 03/31/2019. Repeat every year Bone Density status: Completed 04/07/2019. Results reflect: Bone density results: OSTEOPENIA. Repeat every 2 years.  Lung Cancer Screening: (Low Dose CT Chest recommended if Age 80-80 years, 30 pack-year currently smoking OR have quit w/in 15years.) does not qualify.     Additional Screening:  Hepatitis C Screening: does not qualify; Completed N/A  Vision Screening: Recommended annual ophthalmology exams for  early detection of glaucoma and other disorders of the eye. Is the patient up to date with their annual eye exam?  Yes  Who is the provider or what is the name of the office in which the patient attends annual eye exams? Dr. Dione BoozeGroat  If pt is not established with a provider, would they like to be referred to a provider to establish care? No .   Dental Screening: Recommended annual dental exams for proper oral hygiene  Community Resource Referral / Chronic Care Management: CRR required this visit?  No   CCM required this visit?  No      Plan:     I have personally reviewed and noted the following in the patient's chart:   . Medical and social history . Use of alcohol, tobacco or illicit drugs  . Current medications and supplements . Functional ability and status . Nutritional status . Physical activity . Advanced directives . List of other physicians . Hospitalizations, surgeries, and ER visits in previous 12 months . Vitals . Screenings to include cognitive, depression, and falls . Referrals and appointments  In addition, I have reviewed and discussed with patient certain preventive protocols, quality metrics, and best practice recommendations. A written personalized care plan for preventive services as well as general preventive health recommendations were provided to patient.   Due to this being a telephonic visit, the after visit summary with patients personalized plan was offered to patient via mail or my-chart.  Patient preferred to pick up at office at next visit.   Janalyn Shy, LPN   03/25/9449

## 2020-02-06 ENCOUNTER — Encounter: Payer: Medicare Other | Admitting: Family Medicine

## 2020-02-25 ENCOUNTER — Other Ambulatory Visit: Payer: Self-pay | Admitting: Family Medicine

## 2020-02-25 DIAGNOSIS — E119 Type 2 diabetes mellitus without complications: Secondary | ICD-10-CM

## 2020-02-29 ENCOUNTER — Other Ambulatory Visit: Payer: Self-pay | Admitting: Family Medicine

## 2020-02-29 DIAGNOSIS — E119 Type 2 diabetes mellitus without complications: Secondary | ICD-10-CM

## 2020-02-29 DIAGNOSIS — E785 Hyperlipidemia, unspecified: Secondary | ICD-10-CM

## 2020-02-29 DIAGNOSIS — E039 Hypothyroidism, unspecified: Secondary | ICD-10-CM

## 2020-02-29 DIAGNOSIS — M8580 Other specified disorders of bone density and structure, unspecified site: Secondary | ICD-10-CM

## 2020-03-08 ENCOUNTER — Other Ambulatory Visit: Payer: Self-pay

## 2020-03-08 ENCOUNTER — Other Ambulatory Visit (INDEPENDENT_AMBULATORY_CARE_PROVIDER_SITE_OTHER): Payer: Medicare PPO

## 2020-03-08 DIAGNOSIS — E119 Type 2 diabetes mellitus without complications: Secondary | ICD-10-CM

## 2020-03-08 DIAGNOSIS — M8580 Other specified disorders of bone density and structure, unspecified site: Secondary | ICD-10-CM

## 2020-03-08 DIAGNOSIS — E039 Hypothyroidism, unspecified: Secondary | ICD-10-CM | POA: Diagnosis not present

## 2020-03-08 DIAGNOSIS — E785 Hyperlipidemia, unspecified: Secondary | ICD-10-CM | POA: Diagnosis not present

## 2020-03-08 LAB — CBC WITH DIFFERENTIAL/PLATELET
Basophils Absolute: 0.2 10*3/uL — ABNORMAL HIGH (ref 0.0–0.1)
Basophils Relative: 1.3 % (ref 0.0–3.0)
Eosinophils Absolute: 0.2 10*3/uL (ref 0.0–0.7)
Eosinophils Relative: 2 % (ref 0.0–5.0)
HCT: 38.1 % (ref 36.0–46.0)
Hemoglobin: 12.9 g/dL (ref 12.0–15.0)
Lymphocytes Relative: 8.6 % — ABNORMAL LOW (ref 12.0–46.0)
Lymphs Abs: 1 10*3/uL (ref 0.7–4.0)
MCHC: 33.8 g/dL (ref 30.0–36.0)
MCV: 84.8 fl (ref 78.0–100.0)
Monocytes Absolute: 1.2 10*3/uL — ABNORMAL HIGH (ref 0.1–1.0)
Monocytes Relative: 9.7 % (ref 3.0–12.0)
Neutro Abs: 9.5 10*3/uL — ABNORMAL HIGH (ref 1.4–7.7)
Neutrophils Relative %: 78.4 % — ABNORMAL HIGH (ref 43.0–77.0)
Platelets: 458 10*3/uL — ABNORMAL HIGH (ref 150.0–400.0)
RBC: 4.5 Mil/uL (ref 3.87–5.11)
RDW: 13.7 % (ref 11.5–15.5)
WBC: 12.2 10*3/uL — ABNORMAL HIGH (ref 4.0–10.5)

## 2020-03-08 LAB — MICROALBUMIN / CREATININE URINE RATIO
Creatinine,U: 167.5 mg/dL
Microalb Creat Ratio: 11.3 mg/g (ref 0.0–30.0)
Microalb, Ur: 18.9 mg/dL — ABNORMAL HIGH (ref 0.0–1.9)

## 2020-03-08 LAB — COMPREHENSIVE METABOLIC PANEL
ALT: 14 U/L (ref 0–35)
AST: 17 U/L (ref 0–37)
Albumin: 4 g/dL (ref 3.5–5.2)
Alkaline Phosphatase: 79 U/L (ref 39–117)
BUN: 24 mg/dL — ABNORMAL HIGH (ref 6–23)
CO2: 26 mEq/L (ref 19–32)
Calcium: 9.3 mg/dL (ref 8.4–10.5)
Chloride: 103 mEq/L (ref 96–112)
Creatinine, Ser: 0.84 mg/dL (ref 0.40–1.20)
GFR: 65.1 mL/min (ref >60.00)
Glucose, Bld: 160 mg/dL — ABNORMAL HIGH (ref 70–99)
Potassium: 4.3 mEq/L (ref 3.5–5.1)
Sodium: 140 mEq/L (ref 135–145)
Total Bilirubin: 0.5 mg/dL (ref 0.2–1.2)
Total Protein: 6.4 g/dL (ref 6.0–8.3)

## 2020-03-08 LAB — LIPID PANEL
Cholesterol: 125 mg/dL (ref 0–200)
HDL: 32 mg/dL — ABNORMAL LOW (ref >39.00)
LDL Cholesterol: 68 mg/dL (ref 0–99)
NonHDL: 92.92
Total CHOL/HDL Ratio: 4
Triglycerides: 127 mg/dL (ref 0.0–149.0)
VLDL: 25.4 mg/dL (ref 0.0–40.0)

## 2020-03-08 LAB — VITAMIN D 25 HYDROXY (VIT D DEFICIENCY, FRACTURES): VITD: 43.67 ng/mL (ref 30.00–100.00)

## 2020-03-08 LAB — VITAMIN B12: Vitamin B-12: 102 pg/mL — ABNORMAL LOW (ref 211–911)

## 2020-03-08 LAB — HEMOGLOBIN A1C: Hgb A1c MFr Bld: 6.6 % — ABNORMAL HIGH (ref 4.6–6.5)

## 2020-03-08 LAB — TSH: TSH: 0.11 u[IU]/mL — ABNORMAL LOW (ref 0.35–4.50)

## 2020-03-08 NOTE — Addendum Note (Signed)
Addended by: Alvina Chou on: 03/08/2020 09:01 AM   Modules accepted: Orders

## 2020-03-22 ENCOUNTER — Encounter: Payer: Self-pay | Admitting: Family Medicine

## 2020-03-22 ENCOUNTER — Other Ambulatory Visit: Payer: Self-pay

## 2020-03-22 ENCOUNTER — Ambulatory Visit (INDEPENDENT_AMBULATORY_CARE_PROVIDER_SITE_OTHER): Payer: Medicare PPO | Admitting: Family Medicine

## 2020-03-22 VITALS — BP 128/58 | HR 85 | Temp 97.6°F | Ht 61.5 in | Wt 179.3 lb

## 2020-03-22 DIAGNOSIS — E119 Type 2 diabetes mellitus without complications: Secondary | ICD-10-CM

## 2020-03-22 DIAGNOSIS — Z0000 Encounter for general adult medical examination without abnormal findings: Secondary | ICD-10-CM

## 2020-03-22 DIAGNOSIS — E538 Deficiency of other specified B group vitamins: Secondary | ICD-10-CM

## 2020-03-22 DIAGNOSIS — E785 Hyperlipidemia, unspecified: Secondary | ICD-10-CM

## 2020-03-22 DIAGNOSIS — E039 Hypothyroidism, unspecified: Secondary | ICD-10-CM | POA: Diagnosis not present

## 2020-03-22 DIAGNOSIS — M79671 Pain in right foot: Secondary | ICD-10-CM | POA: Diagnosis not present

## 2020-03-22 MED ORDER — LEVOTHYROXINE SODIUM 88 MCG PO TABS: 88.0000 ug | ORAL_TABLET | Freq: Every day | ORAL | 3 refills | Status: DC

## 2020-03-22 MED ORDER — TB SYRINGE 1 ML MISC: 1.0000 [IU] | 3 refills | Status: DC | PRN

## 2020-03-22 MED ORDER — CYANOCOBALAMIN 1000 MCG/ML IJ SOLN: 1000.0000 ug | Freq: Once | INTRAMUSCULAR | 3 refills | Status: AC

## 2020-03-22 NOTE — Progress Notes (Signed)
Subjective:    Patient ID: Dana Sandoval, female    DOB: 1940-11-02, 79 y.o.   MRN: [id number]  HPI Chief Complaint  Patient presents with  . Annual Exam    part 2   This is an 79 yo female who presents today for annual exam.  She is accompanied by her husband who is also having his annual exam.  Last CPE- 01/29/2019 Mammo- 03/31/2019 Pap- aged out Colonoscopy- 02/18/2008, aged out of screening Tdap- 07/09/2012 Flu-annual Covid 19 vaccine- fully vaccinated Eye- annual, 05/2019 Dental-regular Exercise- household chores  Right foot- bad cramp 2 months ago, lower leg into foot, went away but with foot soreness along medial arch. Got better now with pain across top. Some big toe pain, ? Small swelling, no redness. Symptoms come and go. No new shoes, goes barefoot most of the time in the house.   DM- does not check blood sugar at home. Taking metformin 500 mg twice daily, atorvastatin, ACE.  Hemoglobin A1c 6.6 prior to visit.  Review of Systems  Constitutional: Negative.   HENT: Negative.   Eyes: Negative.   Respiratory: Negative.   Cardiovascular: Negative.   Endocrine: Negative.   Genitourinary: Positive for urgency. Negative for difficulty urinating and dysuria.       Some increasing urgency with urination.  Drinks 4 to 5 glasses of liquid a day.  No worrisome nocturia.  Musculoskeletal:       Foot pain Per HPI  Skin: Negative.   Allergic/Immunologic: Negative.   Neurological: Negative.   Hematological: Negative.   Psychiatric/Behavioral: Negative.        Objective:   Physical Exam    BP (!) 128/58   Pulse 85   Temp 97.6 F (36.4 C) (Temporal)   Ht 5' 1.5" (1.562 m)   Wt 179 lb 5 oz (81.3 kg)   SpO2 97%   BMI 33.33 kg/m  Wt Readings from Last 3 Encounters:  03/22/20 179 lb 5 oz (81.3 kg)  06/17/19 186 lb (84.4 kg)  01/29/19 188 lb 4 oz (85.4 kg)   Depression screen [facility] 2/9 02/05/2020 01/29/2019 03/20/2018  Decreased Interest 0 0 0  Down, Depressed,  Hopeless 0 0 0  PHQ - 2 Score 0 0 0  Altered sleeping 0 - -  Tired, decreased energy 0 - -  Change in appetite 0 - -  Feeling bad or failure about yourself  0 - -  Trouble concentrating 0 - -  Moving slowly or fidgety/restless 0 - -  Suicidal thoughts 0 - -  PHQ-9 Score 0 - -  Difficult doing work/chores Not difficult at all - -   Diabetic Foot Exam - Simple   Simple Foot Form Diabetic Foot exam was performed with the following findings: Yes 03/22/2020  3:11 PM  Visual Inspection No deformities, no ulcerations, no other skin breakdown bilaterally: Yes Sensation Testing Intact to touch and monofilament testing bilaterally: Yes Pulse Check Posterior Tibialis and Dorsalis pulse intact bilaterally: Yes Comments        Assessment & Plan:  1. Annual physical exam - Discussed and encouraged healthy lifestyle choices- adequate sleep, regular exercise, stress management and healthy food choices.    2. Acquired hypothyroidism - TSH low, reduced dose levothyroxine sent to patient's pharmacy, will decrease from 100 to 88 mcg.  -Recheck TSH in 6 months  3. Vitamin B12 deficiency -Patient's daughter is a Publishing rights manager and can give her injections at home.  We will recheck level in 6 months -  cyanocobalamin (,VITAMIN B-12,) 1000 MCG/ML injection; Inject 1 mL (1,000 mcg total) into the muscle once for 1 dose. Once a week x 4 doses, then once a month  Dispense: 4 mL; Refill: 3 - Needles & Syringes (1CC TB SYRINGE) MISC; 1 Units by Does not apply route as needed.  Dispense: 4 each; Refill: 3  4. Right foot pain -Has been moving around and is intermittent in nature.  Advised that she can use some topical diclofenac gel.  Consider supportive footwear.  Follow-up if any worsening of symptoms, redness or swelling occur.  5. Controlled type 2 diabetes mellitus without complication, without long-term current use of insulin (HCC) -Well-controlled on Metformin 500 mg twice daily.  Encouraged her  to continue to watch her diet and monitor carbohydrate intake.  6. Dyslipidemia -At goal with LDL 68 on atorvastatin.  This visit occurred during the SARS-CoV-2 public health emergency.  Safety protocols were in place, including screening questions prior to the visit, additional usage of staff PPE, and extensive cleaning of exam room while observing appropriate contact time as indicated for disinfecting solutions.      Olean Ree, FNP-BC  [facility], [facility]  03/22/2020 5:12 PM

## 2020-03-22 NOTE — Patient Instructions (Addendum)
Voltaren gel- can use up to 3 times a day for foot pain  Please take vitamin B12 injections- one a week for 4 weeks then monthly, recheck in 6 months

## 2020-03-31 DIAGNOSIS — Z1231 Encounter for screening mammogram for malignant neoplasm of breast: Secondary | ICD-10-CM | POA: Diagnosis not present

## 2020-03-31 LAB — HM MAMMOGRAPHY

## 2020-04-08 ENCOUNTER — Other Ambulatory Visit: Payer: Self-pay

## 2020-04-08 ENCOUNTER — Encounter: Payer: Self-pay | Admitting: Cardiology

## 2020-04-08 ENCOUNTER — Ambulatory Visit: Payer: Medicare Other | Admitting: Cardiology

## 2020-04-08 VITALS — BP 150/55 | HR 73 | Resp 16 | Ht 61.0 in | Wt 178.0 lb

## 2020-04-08 DIAGNOSIS — I350 Nonrheumatic aortic (valve) stenosis: Secondary | ICD-10-CM | POA: Diagnosis not present

## 2020-04-08 DIAGNOSIS — I10 Essential (primary) hypertension: Secondary | ICD-10-CM | POA: Diagnosis not present

## 2020-04-08 DIAGNOSIS — E782 Mixed hyperlipidemia: Secondary | ICD-10-CM

## 2020-04-08 DIAGNOSIS — R1900 Intra-abdominal and pelvic swelling, mass and lump, unspecified site: Secondary | ICD-10-CM

## 2020-04-08 DIAGNOSIS — I351 Nonrheumatic aortic (valve) insufficiency: Secondary | ICD-10-CM

## 2020-04-08 NOTE — Progress Notes (Addendum)
Primary Physician/Referring:  Emi Belfast, FNP  Patient ID: Dana Sandoval, female    DOB: 1940-09-08, 79 y.o.   MRN: [id number]  Chief Complaint  Patient presents with  . Aortic stenosis, moderate  . Follow-up    1 year    HPI:    Dana Sandoval  is a 79 y.o. Caucasian femalemale with history of hyperlipidemia, controlled diabetes, HTN, hypothyroidism, and mild to moderate aortic stenosis and mild to moderate AI. She had a normal stress test in 2011 with no evidence of ischemia and LVEF of 83%. 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  Her last surgical exploration was in March 2020 with left knee replacement without periprocedural complications.    She presents for annual visit.  She is presently doing well and denies any chest pain, shortness of breath, PND or orthopnea.   Past Medical History:  Diagnosis Date  . Aortic stenosis   . Arthritis   . Diabetes mellitus    type 2  . Heart murmur    aortic stenosis  . Hyperlipidemia   . Hypothyroidism   . IBS (irritable bowel syndrome)   . NSVD (normal spontaneous vaginal delivery)    X 4  . Pain    INTRASCAPULAR PAIN  . SAB (spontaneous abortion)    X 2  . Thyroid disease    HYPOTHYROIDISM   Past Surgical History:  Procedure Laterality Date  . ABDOMINAL HYSTERECTOMY  1977   TAH  (FIBROIDS)  . DILATION AND CURETTAGE OF UTERUS    . DOPPLER ECHOCARDIOGRAPHY  2009   HAD AN ECHOCARDIOGRAM THAT SHOWED MODERATE MITRAL  ANNULAR CALCIFICATION AND A NORMAL EJECTION FRACTION OF 55%  . OTHER SURGICAL HISTORY     HYSTERECTOMY  . SHOULDER SURGERY     RIGHT SHOULDER SURGERY  . THYROIDECTOMY    . 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 TOTAL KNEE ARTHROPLASTY Left 09/10/2018   Procedure: TOTAL KNEE ARTHROPLASTY;  Surgeon: Marcene Corning, MD;  Location: [facility];  Service: Orthopedics;  Laterality: Left;   Social History   Tobacco Use    . Smoking status: Never Smoker  . Smokeless tobacco: Never Used  Substance Use Topics  . Alcohol use: No    Alcohol/week: 0.0 standard drinks   Marital Status: Married   ROS  Review of Systems  Cardiovascular: Negative for chest pain, dyspnea on exertion and leg swelling.  Musculoskeletal: Positive for arthritis and joint pain.  Gastrointestinal: Negative for melena.   Objective   Vitals with BMI 04/08/2020 03/22/2020 02/05/2020  Height 5\' 1"  5' 1.5" (No Data)  Weight 178 lbs 179 lbs 5 oz (No Data)  BMI 33.65 33.34 -  Systolic 150 128 (No Data)  Diastolic 55 58 (No Data)  Pulse 73 85 (No Data)    Blood pressure (!) 150/55, pulse 73, resp. rate 16, height 5\' 1"  (1.549 m), weight 178 lb (80.7 kg), SpO2 97 %. Body mass index is 33.63 kg/m.   Physical Exam Constitutional:      General: She is not in acute distress.    Appearance: She is well-developed.  Neck:     Thyroid: No thyromegaly.     Vascular: No JVD.  Cardiovascular:     Rate and Rhythm: Normal rate and regular rhythm.     Pulses: Normal pulses and intact distal pulses.  Carotid pulses are on the right side with bruit and on the left side with bruit.    Heart sounds: Murmur heard.  Harsh midsystolic murmur is present with a grade of 2/6 at the upper right sternal border radiating to the neck. High-pitched blowing decrescendo early diastolic murmur is present with a grade of 2/4 at the upper right sternal border radiating to the apex.  No gallop.      Comments: There is no pedal edema, there is no JVD. A very prominent abdominal aortic pulsation is felt, measures at least 4.5 cm. Pulmonary:     Effort: Pulmonary effort is normal.     Breath sounds: Normal breath sounds.  Abdominal:     General: Bowel sounds are normal.     Palpations: Abdomen is soft.    Radiology: No results found.  Laboratory examination:   Recent Labs    03/08/20 0819  NA 140  K 4.3  CL 103  CO2 26  GLUCOSE 160*  BUN 24*   CREATININE 0.84  CALCIUM 9.3   CMP Latest Ref Rng & Units 03/08/2020 01/23/2019 09/02/2018  Glucose 70 - 99 mg/dL 638(G) 665(L) 935(T)  BUN 6 - 23 mg/dL 01(X) 79(T) 90(Z)  Creatinine 0.40 - 1.20 mg/dL 0.09 2.33 0.07  Sodium 135 - 145 mEq/L 140 140 139  Potassium 3.5 - 5.1 mEq/L 4.3 4.2 4.0  Chloride 96 - 112 mEq/L 103 103 105  CO2 19 - 32 mEq/L 26 29 27   Calcium 8.4 - 10.5 mg/dL 9.3 8.6 )  Total Protein 6.0 - 8.3 g/dL 6.4 6.2(U) -  Total Bilirubin 0.2 - 1.2 mg/dL 0.5 0.6 -  Alkaline Phos 39 - 117 U/L 79 72 -  AST 0 - 37 U/L 17 12 -  ALT 0 - 35 U/L 14 9 -   CBC Latest Ref Rng & Units 03/08/2020 01/23/2019 09/02/2018  WBC 4.0 - 10.5 K/uL 12.2(H) 7.5 6.9  Hemoglobin 12.0 - 15.0 g/dL 09/04/2018 11.6(L) 11.7(L)  Hematocrit 36 - 46 % 38.1 34.6(L) 36.7  Platelets 150 - 400 K/uL 458.0(H) 348.0 368   Lipid Panel Recent Labs    03/08/20 0819  CHOL 125  TRIG 127.0  LDLCALC 68  VLDL 25.4  HDL 32.00*  CHOLHDL 4     HEMOGLOBIN A1C Lab Results  Component Value Date   HGBA1C 6.6 (H) 03/08/2020   MPG 120 (H) 07/13/2010   TSH Recent Labs    03/08/20 0819  TSH 0.11*   Medications   Allergies  Allergen Reactions  . Codeine Itching    After 3 days  . Dilaudid [Hydromorphone Hcl] Itching    After 3 days    Current Outpatient Medications on File Prior to Visit  Medication Sig Dispense Refill  . ACCU-CHEK AVIVA PLUS test strip USE AS DIRECTED 100 each 2  . aspirin EC 81 MG tablet Take 81 mg by mouth daily.    03/10/20 atorvastatin (LIPITOR) 40 MG tablet TAKE 1 TABLET BY MOUTH ONCE DAILY AT  6PM 90 tablet 0  . CARAFATE 1 GM/10ML suspension Take 1 g by mouth 4 (four) times daily as needed.    Marland Kitchen levothyroxine (SYNTHROID) 88 MCG tablet Take 1 tablet (88 mcg total) by mouth daily. 90 tablet 3  . metFORMIN (GLUCOPHAGE) 500 MG tablet TAKE 1 TABLET BY MOUTH TWICE DAILY WITH MEALS 180 tablet 0  . Multiple Vitamins-Minerals (PRESERVISION AREDS 2 PO) Take 1 tablet by mouth 2 (two) times daily.      . Needles &  Syringes (1CC TB SYRINGE) MISC 1 Units by Does not apply route as needed. 4 each 3  . NITROGLYCERIN PO Take 0.4 mg by mouth every 5 (five) minutes as needed (chest pain).      . ramipril (ALTACE) 10 MG capsule Take 1 capsule by mouth once daily 90 capsule 0  . valACYclovir (VALTREX) 500 MG tablet Take 500 mg by mouth daily.    Marland Kitchen VITAMIN D PO Take 1,000 Units by mouth daily at 2 PM.     No current facility-administered medications on file prior to visit.    Cardiac Studies:   Echocardiogram 04/03/2019:  Normal LV systolic function with visual EF 60-65%. Left ventricle cavity is normal in size. Normal global wall motion. Unable to evaluate diastolic function due to mitral apparatus calcification.  Appears to suggest Grade II diastolic dysfunction.  Calculated EF 77%. Left atrial cavity is moderately dilated by volume. Right atrial cavity is mild to moderately dilated. Trileaflet aortic valve. Mild to moderate aortic regurgitation. Moderate aortic valve leaflet calcification. Mild aortic valve stenosis. Aortic valve peak pressure gradient of 33.6 and mean gradient of 20 mmHg, calculated aortic valve area 1.1 cm. Moderate calcification of the mitral valve annulus. Moderate posterior mitral valve leaflet calcification. Mild (Grade I) mitral regurgitation. Structurally normal tricuspid valve. Mild to moderate tricuspid regurgitation. Mild pulmonary hypertension. Estimated pulmonary artery systolic pressure is 39 mm Hg with RA pressure estimated at 3 mm Hg. RVSP measures 39 mmHg. No significant change from 04/07/2015 report in AV gradients.   Carotid artery duplex  04/03/19  No hemodynamically significant arterial disease in the internal carotid artery bilaterally.  Antegrade right vertebral artery flow. Antegrade left vertebral artery flow. Compared to 04/01/2018, right ICA stenosis of 15-49% not present.   EKG   EKG 04/08/2020: Normal sinus rhythm at rate of 78 beats per min. Normal  EKG. No significant change from EKG 03/07/2018   Assessment     ICD-10-CM   1. Aortic stenosis, moderate  I35.0 EKG 12-Lead  2. Moderate aortic regurgitation  I35.1   3. Essential hypertension  I10   4. Mixed hyperlipidemia  E78.2   5. Pulsatile abdominal mass  R19.00 PCV AORTA DUPLEX    No orders of the defined types were placed in this encounter. There are no discontinued medications.    Recommendations:   Dana Sandoval  is a 79 y.o. Caucasian female with history of hyperlipidemia, controlled diabetes, HTN, hypothyroidism, and mild to moderate aortic stenosis and mild to moderate AI. She had a normal stress test in 2011 with no evidence of ischemia and LVEF of 83%.  Her last surgical exploration was in March 2020 with left knee replacement without periprocedural complications.  She is here on annual visit, states that her blood pressure is very well controlled at home and also with her PCP and it is the first time that it is elevated and she did not want me to make any changes.  I reviewed her labs, lipids are under excellent control and physical examination is on changed from previous with regard to aortic valve disorder.  She is not in any heart failure.  She continues to remain active. A very prominent abdominal aortic pulsation is felt, measures at least 4.5 cm.  I will schedule her for a abdominal aortic duplex.  Unless markedly abnormal, I will see her back in a year for follow-up.   Yates Decamp, MD, [facility] 04/08/2020, 5:05 PM Office: [phone]

## 2020-04-08 NOTE — Addendum Note (Signed)
Addended by: Delrae Rend on: 04/08/2020 05:06 PM   Modules accepted: Orders

## 2020-04-15 ENCOUNTER — Other Ambulatory Visit: Payer: Self-pay | Admitting: Family Medicine

## 2020-04-15 DIAGNOSIS — E119 Type 2 diabetes mellitus without complications: Secondary | ICD-10-CM

## 2020-04-15 DIAGNOSIS — E785 Hyperlipidemia, unspecified: Secondary | ICD-10-CM

## 2020-04-16 ENCOUNTER — Other Ambulatory Visit: Payer: Self-pay

## 2020-04-16 ENCOUNTER — Ambulatory Visit: Payer: Medicare PPO

## 2020-04-16 DIAGNOSIS — I77819 Aortic ectasia, unspecified site: Secondary | ICD-10-CM

## 2020-04-16 DIAGNOSIS — R1900 Intra-abdominal and pelvic swelling, mass and lump, unspecified site: Secondary | ICD-10-CM

## 2020-05-17 ENCOUNTER — Encounter: Payer: Self-pay | Admitting: Family Medicine

## 2020-06-02 DIAGNOSIS — H04123 Dry eye syndrome of bilateral lacrimal glands: Secondary | ICD-10-CM | POA: Diagnosis not present

## 2020-06-02 DIAGNOSIS — H40013 Open angle with borderline findings, low risk, bilateral: Secondary | ICD-10-CM | POA: Diagnosis not present

## 2020-06-02 DIAGNOSIS — H52203 Unspecified astigmatism, bilateral: Secondary | ICD-10-CM | POA: Diagnosis not present

## 2020-06-02 DIAGNOSIS — H26493 Other secondary cataract, bilateral: Secondary | ICD-10-CM | POA: Diagnosis not present

## 2020-06-02 DIAGNOSIS — E119 Type 2 diabetes mellitus without complications: Secondary | ICD-10-CM | POA: Diagnosis not present

## 2020-06-02 DIAGNOSIS — H5213 Myopia, bilateral: Secondary | ICD-10-CM | POA: Diagnosis not present

## 2020-06-02 DIAGNOSIS — Z961 Presence of intraocular lens: Secondary | ICD-10-CM | POA: Diagnosis not present

## 2020-06-02 DIAGNOSIS — H524 Presbyopia: Secondary | ICD-10-CM | POA: Diagnosis not present

## 2020-06-02 DIAGNOSIS — H353132 Nonexudative age-related macular degeneration, bilateral, intermediate dry stage: Secondary | ICD-10-CM | POA: Diagnosis not present

## 2020-06-02 LAB — HM DIABETES EYE EXAM

## 2020-06-15 ENCOUNTER — Other Ambulatory Visit: Payer: Self-pay | Admitting: Family Medicine

## 2020-06-15 DIAGNOSIS — E119 Type 2 diabetes mellitus without complications: Secondary | ICD-10-CM

## 2020-06-16 NOTE — Telephone Encounter (Signed)
Pharmacy requests refill on: Metformin 500 mg   LAST REFILL: 02/25/2020 (Q-180, R-0) LAST OV: 03/22/2020 NEXT OV: Not Scheduled  PHARMACY: [id number] [place], [place]  Hgb A1C (03/08/2020): 6.6

## 2020-06-22 ENCOUNTER — Telehealth: Payer: Self-pay | Admitting: Family Medicine

## 2020-06-22 NOTE — Telephone Encounter (Signed)
Wanted to know about getting TOC from [facility] to [facility] and it would not allow me to put them in there,  Her husband is 11[birth year]38 - harold Navarette

## 2020-07-20 ENCOUNTER — Encounter: Payer: Self-pay | Admitting: Family Medicine

## 2020-07-20 ENCOUNTER — Ambulatory Visit: Payer: Medicare PPO | Admitting: Family Medicine

## 2020-07-20 ENCOUNTER — Other Ambulatory Visit: Payer: Self-pay

## 2020-07-20 VITALS — BP 142/50 | HR 92 | Temp 95.1°F | Ht 62.0 in | Wt 175.0 lb

## 2020-07-20 DIAGNOSIS — E039 Hypothyroidism, unspecified: Secondary | ICD-10-CM | POA: Diagnosis not present

## 2020-07-20 DIAGNOSIS — E785 Hyperlipidemia, unspecified: Secondary | ICD-10-CM

## 2020-07-20 DIAGNOSIS — E538 Deficiency of other specified B group vitamins: Secondary | ICD-10-CM | POA: Diagnosis not present

## 2020-07-20 DIAGNOSIS — I350 Nonrheumatic aortic (valve) stenosis: Secondary | ICD-10-CM | POA: Diagnosis not present

## 2020-07-20 DIAGNOSIS — E119 Type 2 diabetes mellitus without complications: Secondary | ICD-10-CM

## 2020-07-20 DIAGNOSIS — Z7189 Other specified counseling: Secondary | ICD-10-CM

## 2020-07-20 LAB — HEMOGLOBIN A1C: Hgb A1c MFr Bld: 6.6 % — ABNORMAL HIGH (ref 4.6–6.5)

## 2020-07-20 LAB — VITAMIN B12: Vitamin B-12: 539 pg/mL (ref 211–911)

## 2020-07-20 LAB — TSH: TSH: 0.49 u[IU]/mL (ref 0.35–4.50)

## 2020-07-20 MED ORDER — RAMIPRIL 10 MG PO CAPS: 10.0000 mg | ORAL_CAPSULE | Freq: Every day | ORAL | 3 refills | Status: DC

## 2020-07-20 MED ORDER — ATORVASTATIN CALCIUM 40 MG PO TABS: ORAL_TABLET | ORAL | 3 refills | Status: DC

## 2020-07-20 NOTE — Progress Notes (Signed)
This visit occurred during the SARS-CoV-2 public health emergency.  Safety protocols were in place, including screening questions prior to the visit, additional usage of staff PPE, and extensive cleaning of exam room while observing appropriate contact time as indicated for disinfecting solutions.  Transfer of care.    Husband designated if patient were incapacitated.    B12 def.  Due for f/u labs.  She is taking 1 shot a month.  Done at home by her daughter.  Compliant.  Last dose was 6 days ago.    Hypothyroidism.  Compliant.  Due for f/u labs.  No dysphagia.  Routine use of med d/w pt.    Diabetes:  Using medications without difficulties: yes, metformin.   Hypoglycemic episodes: no Hyperglycemic episodes:no Feet problems:no Blood Sugars averaging: usually ~120 eye exam within last year: 06/02/20, Dr. Dione Booze.   H/o AS.  D/w pt.  Not lightheaded.  She had cardiac follow up.  No CP.  Aortic stenosis pathophysiology and treatment/blood pressure control discussed with patient.  Meds, vitals, and allergies reviewed.   ROS: Per HPI unless specifically indicated in ROS section   GEN: nad, alert and oriented HEENT: ncat NECK: supple w/o LA CV: rrr. SEM noted as expected.  Heard best at the right upper sternal border. PULM: ctab, no inc wob ABD: soft, +bs EXT: no edema SKIN: no acute rash

## 2020-07-20 NOTE — Patient Instructions (Addendum)
Take care.  Glad to see you. Go to the lab on the way out.   If you have mychart we'll likely use that to update you.    Update me as needed.   Plan on recheck in about 4 months.

## 2020-07-21 DIAGNOSIS — Z7189 Other specified counseling: Secondary | ICD-10-CM | POA: Insufficient documentation

## 2020-07-21 NOTE — Assessment & Plan Note (Signed)
No change in meds.  Eye exam up-to-date.  Requesting records.  See notes on labs.  Continue metformin.  Continue work on diet and exercise.

## 2020-07-21 NOTE — Assessment & Plan Note (Signed)
Continue B12 replacement as is.  See notes on labs.

## 2020-07-21 NOTE — Assessment & Plan Note (Signed)
Husband designated if patient were incapacitated. 

## 2020-07-21 NOTE — Assessment & Plan Note (Signed)
Not lightheaded.  She had cardiac follow up.  No CP.  Aortic stenosis pathophysiology and treatment/blood pressure control discussed with patient.  No change in meds at this point.

## 2020-07-21 NOTE — Assessment & Plan Note (Signed)
Compliant.  Due for f/u labs.  No dysphagia.  Routine use of med d/w pt.   continue levothyroxine.

## 2020-08-11 DIAGNOSIS — E119 Type 2 diabetes mellitus without complications: Secondary | ICD-10-CM | POA: Diagnosis not present

## 2020-08-11 DIAGNOSIS — H26493 Other secondary cataract, bilateral: Secondary | ICD-10-CM | POA: Diagnosis not present

## 2020-08-11 DIAGNOSIS — H43811 Vitreous degeneration, right eye: Secondary | ICD-10-CM | POA: Diagnosis not present

## 2020-08-11 DIAGNOSIS — H353132 Nonexudative age-related macular degeneration, bilateral, intermediate dry stage: Secondary | ICD-10-CM | POA: Diagnosis not present

## 2020-08-11 DIAGNOSIS — Z961 Presence of intraocular lens: Secondary | ICD-10-CM | POA: Diagnosis not present

## 2020-08-11 DIAGNOSIS — H04123 Dry eye syndrome of bilateral lacrimal glands: Secondary | ICD-10-CM | POA: Diagnosis not present

## 2020-08-11 DIAGNOSIS — H40013 Open angle with borderline findings, low risk, bilateral: Secondary | ICD-10-CM | POA: Diagnosis not present

## 2020-09-14 DIAGNOSIS — H40013 Open angle with borderline findings, low risk, bilateral: Secondary | ICD-10-CM | POA: Diagnosis not present

## 2020-09-14 DIAGNOSIS — Z961 Presence of intraocular lens: Secondary | ICD-10-CM | POA: Diagnosis not present

## 2020-09-14 DIAGNOSIS — H04123 Dry eye syndrome of bilateral lacrimal glands: Secondary | ICD-10-CM | POA: Diagnosis not present

## 2020-09-14 DIAGNOSIS — H353132 Nonexudative age-related macular degeneration, bilateral, intermediate dry stage: Secondary | ICD-10-CM | POA: Diagnosis not present

## 2020-09-14 DIAGNOSIS — H43811 Vitreous degeneration, right eye: Secondary | ICD-10-CM | POA: Diagnosis not present

## 2020-09-14 DIAGNOSIS — H26493 Other secondary cataract, bilateral: Secondary | ICD-10-CM | POA: Diagnosis not present

## 2020-09-14 DIAGNOSIS — E119 Type 2 diabetes mellitus without complications: Secondary | ICD-10-CM | POA: Diagnosis not present

## 2020-11-11 DIAGNOSIS — H43811 Vitreous degeneration, right eye: Secondary | ICD-10-CM | POA: Diagnosis not present

## 2020-11-11 DIAGNOSIS — Z961 Presence of intraocular lens: Secondary | ICD-10-CM | POA: Diagnosis not present

## 2020-11-11 DIAGNOSIS — H04123 Dry eye syndrome of bilateral lacrimal glands: Secondary | ICD-10-CM | POA: Diagnosis not present

## 2020-11-11 DIAGNOSIS — H353132 Nonexudative age-related macular degeneration, bilateral, intermediate dry stage: Secondary | ICD-10-CM | POA: Diagnosis not present

## 2020-11-11 DIAGNOSIS — H40013 Open angle with borderline findings, low risk, bilateral: Secondary | ICD-10-CM | POA: Diagnosis not present

## 2020-11-11 DIAGNOSIS — E119 Type 2 diabetes mellitus without complications: Secondary | ICD-10-CM | POA: Diagnosis not present

## 2020-11-11 DIAGNOSIS — H353210 Exudative age-related macular degeneration, right eye, stage unspecified: Secondary | ICD-10-CM | POA: Diagnosis not present

## 2020-11-11 DIAGNOSIS — H26493 Other secondary cataract, bilateral: Secondary | ICD-10-CM | POA: Diagnosis not present

## 2020-11-12 ENCOUNTER — Encounter (INDEPENDENT_AMBULATORY_CARE_PROVIDER_SITE_OTHER): Payer: Self-pay | Admitting: Ophthalmology

## 2020-11-12 ENCOUNTER — Other Ambulatory Visit: Payer: Self-pay

## 2020-11-12 ENCOUNTER — Ambulatory Visit (INDEPENDENT_AMBULATORY_CARE_PROVIDER_SITE_OTHER): Payer: Medicare PPO | Admitting: Ophthalmology

## 2020-11-12 DIAGNOSIS — H3581 Retinal edema: Secondary | ICD-10-CM

## 2020-11-12 DIAGNOSIS — I10 Essential (primary) hypertension: Secondary | ICD-10-CM | POA: Diagnosis not present

## 2020-11-12 DIAGNOSIS — H353211 Exudative age-related macular degeneration, right eye, with active choroidal neovascularization: Secondary | ICD-10-CM | POA: Diagnosis not present

## 2020-11-12 DIAGNOSIS — Z961 Presence of intraocular lens: Secondary | ICD-10-CM | POA: Diagnosis not present

## 2020-11-12 DIAGNOSIS — H353122 Nonexudative age-related macular degeneration, left eye, intermediate dry stage: Secondary | ICD-10-CM

## 2020-11-12 DIAGNOSIS — H35033 Hypertensive retinopathy, bilateral: Secondary | ICD-10-CM

## 2020-11-12 NOTE — Progress Notes (Signed)
[facility] Note  11/12/2020     CHIEF COMPLAINT Patient presents for Retina Evaluation   HISTORY OF PRESENT ILLNESS: Dana Sandoval is a 80 y.o. female who presents to the clinic today for:   HPI    Retina Evaluation    In right eye.  This started months ago.  Duration of months.  Associated Symptoms Flashes and Floaters.  Negative for Distortion, Blind Spot, Pain, Redness, Photophobia, Glare, Trauma, Scalp Tenderness, Jaw Claudication, Shoulder/Hip pain, Fever, Weight Loss and Fatigue.  Context:  distance vision, mid-range vision and near vision.  Treatments tried include no treatments.  I, the attending physician,  performed the HPI with the patient and updated documentation appropriately.          Comments    80 y/o female pt referred by Dr. Zenia Resides yesterday for eval of ret heme OD.  Pt has had a continuous "spot" in her vision OD for several mos; first noticed around the first of the year.  Dr. Katy Fitch has been monitoring spot every couple of months.  Pt states her VA OD has been gradually getting worse over the past several months.  VA OS good [place].  Denies pain, FOL.  No gtts.  BS unknown.  A1C 6.5 1.11.22.       Last edited by Bernarda Caffey, MD on 11/16/2020  1:21 AM. (History)    pt is here on the referral of Dr. Zenia Resides for concern of retinal heme OS, pt states she started to notice a change in her vision at the first of the year, pt saw Dr. Katy Fitch in February and was told to come back for a follow up in March, when she saw him in March there was no change in her vision, so she was told to come back for her regular follow up in November, pt states her vision was getting a little worse so she went back to see Dr. Katy Fitch yesterday, pt saw Dr. 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 [place],  [place] [address]  HISTORICAL  INFORMATION:   Selected notes from the [place] Referred by Dr. Zenia Resides for eval of ret heme OD   CURRENT MEDICATIONS: No current outpatient medications on file. (Ophthalmic Drugs)   No current facility-administered medications for this visit. (Ophthalmic Drugs)   Current Outpatient Medications (Other)  Medication Sig  . ACCU-CHEK AVIVA PLUS test strip USE AS DIRECTED  . aspirin EC 81 MG tablet Take 81 mg by mouth daily.  Marland Kitchen atorvastatin (LIPITOR) 40 MG tablet TAKE 1 TABLET BY MOUTH ONCE DAILY AT  6PM  . Cyanocobalamin 1000 MCG/ML KIT Inject 1,000 mcg as directed every 30 (thirty) days.  Marland Kitchen levothyroxine (SYNTHROID) 88 MCG tablet Take 1 tablet (88 mcg total) by mouth daily.  . metFORMIN (GLUCOPHAGE) 500 MG tablet TAKE 1 TABLET BY MOUTH TWICE DAILY WITH MEALS  . Multiple Vitamins-Minerals (PRESERVISION AREDS 2 PO) Take 1 tablet by mouth 2 (two) times daily.   . Needles & Syringes (1CC TB SYRINGE) MISC 1 Units by Does not apply route as needed.  Marland Kitchen NITROGLYCERIN PO Take 0.4 mg by mouth every 5 (five) minutes as needed (chest pain).    . ramipril (ALTACE) 10 MG capsule Take 1 capsule (10 mg total) by mouth daily.  . valACYclovir (VALTREX) 500 MG tablet Take 500  mg by mouth daily.  Marland Kitchen VITAMIN D PO Take 1,000 Units by mouth daily at 2 PM.   No current facility-administered medications for this visit. (Other)      REVIEW OF SYSTEMS: ROS    Positive for: Gastrointestinal, Musculoskeletal, Endocrine, Cardiovascular, Eyes   Negative for: Constitutional, Neurological, Skin, Genitourinary, HENT, Respiratory, Psychiatric, Allergic/Imm, Heme/Lymph   Last edited by Matthew Folks, COA on 11/12/2020  9:04 AM. (History)       ALLERGIES Allergies  Allergen Reactions  . Codeine Itching    After 3 days  . Dilaudid [Hydromorphone Hcl] Itching    After 3 days    PAST MEDICAL HISTORY Past Medical History:  Diagnosis Date  . Aortic stenosis   . Arthritis   . B12 deficiency   .  Diabetes mellitus    type 2  . Heart murmur    aortic stenosis  . Hyperlipidemia   . Hypothyroidism   . IBS (irritable bowel syndrome)   . NSVD (normal spontaneous vaginal delivery)    X 4  . Pain    INTRASCAPULAR PAIN  . SAB (spontaneous abortion)    X 2  . Thyroid disease    HYPOTHYROIDISM   Past Surgical History:  Procedure Laterality Date  . ABDOMINAL HYSTERECTOMY  1977   TAH  (FIBROIDS)  . CATARACT EXTRACTION Bilateral   . DILATION AND CURETTAGE OF UTERUS    . DOPPLER ECHOCARDIOGRAPHY  2009   HAD AN ECHOCARDIOGRAM THAT SHOWED MODERATE MITRAL  ANNULAR CALCIFICATION AND A NORMAL EJECTION FRACTION OF 55%  . EYE SURGERY Bilateral    Cat Sx  . OTHER SURGICAL HISTORY     HYSTERECTOMY  . SHOULDER SURGERY     RIGHT SHOULDER SURGERY  . THYROIDECTOMY    . 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 TOTAL KNEE ARTHROPLASTY Left 09/10/2018   Procedure: TOTAL KNEE ARTHROPLASTY;  Surgeon: Melrose Nakayama, MD;  Location: [facility];  Service: Orthopedics;  Laterality: Left;    FAMILY HISTORY Family History  Problem Relation Age of Onset  . Osteoporosis Mother   . Heart disease Mother   . Macular degeneration Mother   . Heart disease Father   . Cancer Father        BRAIN TUMOR  . Diabetes Maternal Aunt   . Diabetes Maternal Uncle   . Cancer Brother        BRAIN TUMOR  . Colon cancer Neg Hx   . Breast cancer Neg Hx     SOCIAL HISTORY Social History   Tobacco Use  . Smoking status: Never Smoker  . Smokeless tobacco: Never Used  Vaping Use  . Vaping Use: Never used  Substance Use Topics  . Alcohol use: No    Alcohol/week: 0.0 standard drinks  . Drug use: No         OPHTHALMIC EXAM:  Base Eye Exam    Visual Acuity (Snellen - Linear)      Right Left   Dist [place] 20/70 -2 20/25 -2   Dist ph [place] NI NI       Tonometry (Tonopen, 9:09 AM)      Right Left   Pressure 15 13       Pupils      Dark Light Shape React APD   Right 3 2 Round Slow None   Left 3 2  Round Slow None       Visual Fields (Counting fingers)      Left Right  Full Full       Extraocular Movement      Right Left    Full, Ortho Full, Ortho       Neuro/Psych    Oriented x3: Yes   Mood/Affect: Normal       Dilation    Both eyes: 1.0% Mydriacyl, 2.5% Phenylephrine @ 9:09 AM        Slit Lamp and Fundus Exam    Slit Lamp Exam      Right Left   Lids/Lashes Dermatochalasis - upper lid Dermatochalasis - upper lid   Conjunctiva/Sclera White and quiet White and quiet   Cornea 1+ Punctate epithelial erosions, fine endo pigment 1-2+ fine Punctate epithelial erosions, fine endo pigment   Anterior Chamber Deep and quiet Deep and quiet   Iris Round and dilated Round and dilated   Lens Toric PC IOL with marks at 1200 and 0600, 1+ Posterior capsular opacification PC IOL in good position, 1-2+ Posterior capsular opacification   Vitreous Vitreous syneresis, trace pigment, Posterior vitreous detachment, vitreous condensations Vitreous syneresis, Posterior vitreous detachment, vitreous condensations       Fundus Exam      Right Left   Disc mild Pallor, Sharp rim, +cupping, PPA mild Pallor, Sharp rim, +cupping, PPA   C/D Ratio 0.7 0.7   Macula Blunted foveal reflex, central CNV with subretinal heme along inferior aspect, RPE mottling, clumping and atrophy, Drusen Flat, Blunted foveal reflex, Drusen, RPE mottling, clumping and atrophy, No heme or edema   Vessels attenuated, Tortuous, AV crossing changes attenuated, Tortuous, AV crossing changes   Periphery Attached, reticular degeneration, paving stone degeneration IT quad Attached, reticular degeneration, mild inferior paving stone degeneration        Refraction    Manifest Refraction      Sphere Cylinder Axis Dist VA   Right [place] +0.25 020 20/70   Left -0.25 Sphere  20/25          IMAGING AND PROCEDURES  Imaging and Procedures for 11/12/2020  OCT, Retina - OU - Both Eyes       Right Eye Quality was good.  Central Foveal Thickness: 613. Progression has no prior data. Findings include abnormal foveal contour, intraretinal fluid, subretinal fluid, subretinal hyper-reflective material, pigment epithelial detachment, retinal drusen .   Left Eye Quality was good. Central Foveal Thickness: 225. Progression has no prior data. Findings include normal foveal contour, no IRF, no SRF, abnormal foveal contour, outer retinal atrophy.   Notes *Images captured and stored on drive  Diagnosis / Impression:  OD: exu ARMD  OS: non-exu ARMD   Clinical management:  See below  Abbreviations: NFP - Normal foveal profile. CME - cystoid macular edema. PED - pigment epithelial detachment. IRF - intraretinal fluid. SRF - subretinal fluid. EZ - ellipsoid zone. ERM - epiretinal membrane. ORA - outer retinal atrophy. ORT - outer retinal tubulation. SRHM - subretinal hyper-reflective material. IRHM - intraretinal hyper-reflective material        Intravitreal Injection, Pharmacologic Agent - OD - Right Eye       Time Out 11/12/2020. 9:40 AM. Confirmed correct patient, procedure, site, and patient consented.   Anesthesia Topical anesthesia was used. Anesthetic medications included Lidocaine 2%, Proparacaine 0.5%.   Procedure Preparation included 5% betadine to ocular surface, eyelid speculum (32g). A (32g) needle was used.   Injection:  1.25 mg Bevacizumab (AVASTIN) 1.34m/0.05mL SOLN   NDC: 597989-211-94 Lot:: [id number] Expiration date: 11/28/2020   Route: Intravitreal, Site: Right Eye, Waste: 0.05 mL  Post-op  Post injection exam found visual acuity of at least counting fingers. The patient tolerated the procedure well. There were no complications. The patient received written and verbal post procedure care education. Post injection medications were not given.               ASSESSMENT/PLAN:    ICD-10-CM   1. Exudative age-related macular degeneration of right eye with active choroidal neovascularization  (HCC)  H35.3211 Intravitreal Injection, Pharmacologic Agent - OD - Right Eye    Bevacizumab (AVASTIN) SOLN 1.25 mg  2. Retinal edema  H35.81 OCT, Retina - OU - Both Eyes  3. Intermediate stage nonexudative age-related macular degeneration of left eye  H35.3122   4. Essential hypertension  I10   5. Hypertensive retinopathy of both eyes  H35.033   6. Pseudophakia, both eyes  Z96.1     1,2. Exudative age related macular degeneration, OD  - The incidence pathology and anatomy of wet AMD discussed   - discussed treatment options including observation vs intravitreal anti-VEGF agents such as Avastin, Lucentis, Eylea.    - Risks of endophthalmitis and vascular occlusive events and atrophic changes discussed with patient  - OCT shows central CNV w/ +IRF and SRF  - recommend IVA OD #1 today, 05.06.22 - pt wishes to be treated with IVA - RBA of procedure discussed, questions answered - IVA informed consent obtained and signed, 05.06.22 (OD) - see procedure note  - f/u in 4 wks -- DFE/OCT, possible injection  3. Age related macular degeneration, non-exudative, OS  - The incidence, anatomy, and pathology of dry AMD, risk of progression, and the AREDS and AREDS 2 studies including smoking risks discussed with patient.  - Recommend amsler grid monitoring  - f/u 3 months  4,5. Hypertensive retinopathy OU - discussed importance of tight BP control - monitor  6. Pseudophakia OU  - s/p CE/IOL  - IOL in good position, doing well  - monitor   Ophthalmic Meds Ordered this visit:  Meds ordered this encounter  Medications  . Bevacizumab (AVASTIN) SOLN 1.25 mg       Return in about 4 weeks (around 12/10/2020) for f/u exu ARMD OD, DFE, OCT.  There are no Patient Instructions on file for this visit.   Explained the diagnoses, plan, and follow up with the patient and they expressed understanding.  Patient expressed understanding of the importance of proper follow up care.   This document serves  as a record of services personally performed by Gardiner Sleeper, MD, PhD. It was created on their behalf by Estill Bakes, COT an ophthalmic technician. The creation of this record is the provider's dictation and/or activities during the visit.    Electronically signed by: Estill Bakes, COT 5.6.22 @ 1:24 AM   This document serves as a record of services personally performed by Gardiner Sleeper, MD, PhD. It was created on their behalf by San Jetty. Owens Shark, OA an ophthalmic technician. The creation of this record is the provider's dictation and/or activities during the visit.    Electronically signed by: San Jetty. Owens Shark, [place] 05.06.2022 1:24 AM  Gardiner Sleeper, M.D., Ph.D. Diseases & Surgery of the Retina and Vitreous [facility] 5.6.22  I have reviewed the above documentation for accuracy and completeness, and I agree with the above. Gardiner Sleeper, M.D., Ph.D. 11/16/20 1:24 AM   Abbreviations: M myopia (nearsighted); A astigmatism; H hyperopia (farsighted); P presbyopia; Mrx spectacle prescription;  CTL contact lenses; OD right eye; OS  left eye; OU both eyes  XT exotropia; ET esotropia; PEK punctate epithelial keratitis; PEE punctate epithelial erosions; DES dry eye syndrome; MGD meibomian gland dysfunction; ATs artificial tears; PFAT's preservative free artificial tears; [place] nuclear sclerotic cataract; PSC posterior subcapsular cataract; ERM epi-retinal membrane; PVD posterior vitreous detachment; RD retinal detachment; DM diabetes mellitus; DR diabetic retinopathy; NPDR non-proliferative diabetic retinopathy; PDR proliferative diabetic retinopathy; CSME clinically significant macular edema; DME diabetic macular edema; dbh dot blot hemorrhages; CWS cotton wool spot; POAG primary open angle glaucoma; C/D cup-to-disc ratio; HVF humphrey visual field; GVF goldmann visual field; OCT optical coherence tomography; IOP intraocular pressure; BRVO Branch retinal vein occlusion; CRVO  central retinal vein occlusion; CRAO central retinal artery occlusion; BRAO branch retinal artery occlusion; RT retinal tear; SB scleral buckle; PPV pars plana vitrectomy; VH Vitreous hemorrhage; PRP panretinal laser photocoagulation; IVK intravitreal kenalog; VMT vitreomacular traction; MH Macular hole;  NVD neovascularization of the disc; NVE neovascularization elsewhere; AREDS age related eye disease study; ARMD age related macular degeneration; POAG primary open angle glaucoma; EBMD epithelial/anterior basement membrane dystrophy; ACIOL anterior chamber intraocular lens; IOL intraocular lens; PCIOL posterior chamber intraocular lens; Phaco/IOL phacoemulsification with intraocular lens placement; [place] photorefractive keratectomy; LASIK laser assisted in situ keratomileusis; HTN hypertension; DM diabetes mellitus; COPD chronic obstructive pulmonary disease

## 2020-11-16 ENCOUNTER — Encounter (INDEPENDENT_AMBULATORY_CARE_PROVIDER_SITE_OTHER): Payer: Self-pay | Admitting: Ophthalmology

## 2020-11-16 DIAGNOSIS — H35033 Hypertensive retinopathy, bilateral: Secondary | ICD-10-CM | POA: Diagnosis not present

## 2020-11-16 DIAGNOSIS — H3581 Retinal edema: Secondary | ICD-10-CM | POA: Diagnosis not present

## 2020-11-16 DIAGNOSIS — H353211 Exudative age-related macular degeneration, right eye, with active choroidal neovascularization: Secondary | ICD-10-CM

## 2020-11-16 DIAGNOSIS — Z961 Presence of intraocular lens: Secondary | ICD-10-CM | POA: Diagnosis not present

## 2020-11-16 DIAGNOSIS — H353122 Nonexudative age-related macular degeneration, left eye, intermediate dry stage: Secondary | ICD-10-CM | POA: Diagnosis not present

## 2020-11-16 DIAGNOSIS — I10 Essential (primary) hypertension: Secondary | ICD-10-CM | POA: Diagnosis not present

## 2020-11-16 MED ORDER — BEVACIZUMAB CHEMO INJECTION 1.25MG/0.05ML SYRINGE FOR KALEIDOSCOPE
1.2500 mg | INTRAVITREAL | Status: AC | PRN
  Administered 2020-11-16: 1.25 mg via INTRAVITREAL

## 2020-11-18 ENCOUNTER — Ambulatory Visit: Payer: Medicare PPO | Admitting: Family Medicine

## 2020-11-18 ENCOUNTER — Encounter: Payer: Self-pay | Admitting: Family Medicine

## 2020-11-18 ENCOUNTER — Other Ambulatory Visit: Payer: Self-pay

## 2020-11-18 VITALS — BP 124/60 | HR 87 | Temp 97.7°F | Wt 176.3 lb

## 2020-11-18 DIAGNOSIS — E119 Type 2 diabetes mellitus without complications: Secondary | ICD-10-CM

## 2020-11-18 DIAGNOSIS — E538 Deficiency of other specified B group vitamins: Secondary | ICD-10-CM

## 2020-11-18 LAB — VITAMIN B12: Vitamin B-12: 292 pg/mL (ref 211–911)

## 2020-11-18 LAB — HEMOGLOBIN A1C: Hgb A1c MFr Bld: 6.7 % — ABNORMAL HIGH (ref 4.6–6.5)

## 2020-11-18 MED ORDER — CYANOCOBALAMIN 1000 MCG/ML IJ KIT: PACK | INTRAMUSCULAR | Status: DC

## 2020-11-18 MED ORDER — ASPIRIN EC 81 MG PO TBEC: 81.0000 mg | DELAYED_RELEASE_TABLET | ORAL | Status: DC

## 2020-11-18 NOTE — Progress Notes (Signed)
This visit occurred during the SARS-CoV-2 public health emergency.  Safety protocols were in place, including screening questions prior to the visit, additional usage of staff PPE, and extensive cleaning of exam room while observing appropriate contact time as indicated for disinfecting solutions.  Diabetes:  Using medications without difficulties: yes Hypoglycemic episodes: no Hyperglycemic episodes: no Feet problems: no Blood Sugars averaging: 120-130s, lowest was 102.   eye exam within last year: yes, she has f/u pending.   A1c pending.   B12 shot last done early April, about 1 month ago.  She prev did B12 shots at home.  See notes on labs.  Meds, vitals, and allergies reviewed.  ROS: Per HPI unless specifically indicated in ROS section   GEN: nad, alert and oriented HEENT: ncat NECK: supple w/o LA CV: rrr.  Soft systolic murmur noted at baseline. PULM: ctab, no inc wob ABD: soft, +bs EXT: no edema SKIN: no acute rash

## 2020-11-18 NOTE — Patient Instructions (Signed)
Go to the lab on the way out.   If you have mychart we'll likely use that to update you.    Don't change your meds for now.  Take care.  Glad to see you. We'll see about when to get together again when I see your labs.   If you have any low sugars, cut the metformin back to 1 pill a day and let me know.

## 2020-11-21 ENCOUNTER — Other Ambulatory Visit: Payer: Self-pay | Admitting: Family Medicine

## 2020-11-21 DIAGNOSIS — M8580 Other specified disorders of bone density and structure, unspecified site: Secondary | ICD-10-CM

## 2020-11-21 DIAGNOSIS — E119 Type 2 diabetes mellitus without complications: Secondary | ICD-10-CM

## 2020-11-21 DIAGNOSIS — E538 Deficiency of other specified B group vitamins: Secondary | ICD-10-CM

## 2020-11-21 MED ORDER — CYANOCOBALAMIN 1000 MCG/ML IJ KIT: 1000.0000 ug | PACK | INTRAMUSCULAR | Status: DC

## 2020-11-21 NOTE — Assessment & Plan Note (Signed)
If any low sugars, cut the metformin back to 1 pill a day and let me know.  See notes on labs.  Okay for outpatient follow-up.  Otherwise continue metformin 1 tab twice a day for now.

## 2020-11-21 NOTE — Assessment & Plan Note (Signed)
See notes on labs. 

## 2020-11-24 ENCOUNTER — Telehealth: Payer: Self-pay

## 2020-11-24 DIAGNOSIS — E538 Deficiency of other specified B group vitamins: Secondary | ICD-10-CM

## 2020-11-24 MED ORDER — CYANOCOBALAMIN 1000 MCG/ML IJ KIT: 1000.0000 ug | PACK | INTRAMUSCULAR | 3 refills | Status: DC

## 2020-11-24 MED ORDER — TB SYRINGE 1 ML MISC: 1.0000 [IU] | 3 refills | Status: DC | PRN

## 2020-11-24 NOTE — Telephone Encounter (Signed)
Called patient back to see if patient does injections at home or get them in office. Patient states she does her injections at home and needs rx sent to [facility] pharmacy. Rxs have been sent.

## 2020-11-24 NOTE — Telephone Encounter (Signed)
Pt left v/m that she read my chart and Dr Damita Dunnings wants her to continue B12; pt said she has none and needs rx of B 12 to [facility]. Med list has B12 injection kit on it but has no print for order class. Pt request cb.

## 2020-12-08 NOTE — Progress Notes (Signed)
[facility] Note  12/10/2020     CHIEF COMPLAINT Patient presents for Retina Follow Up   HISTORY OF PRESENT ILLNESS: Dana Sandoval is a 80 y.o. female who presents to the clinic today for:   HPI    Retina Follow Up    Patient presents with  Wet AMD.  In right eye.  This started weeks ago.  Severity is moderate.  Duration of weeks.  Since onset it is stable.  I, the attending physician,  performed the HPI with the patient and updated documentation appropriately.          Comments    Pt states she does not see a difference in her vision OD since last visit.  Pt denies eye pain or discomfort.        Last edited by Bernarda Caffey, MD on 12/10/2020  9:31 AM. (History)    pt states no problems with injection at last visit, except for a little soreness, no change in vision yet  Referring physician: No referring provider defined for this encounter.  HISTORICAL INFORMATION:   Selected notes from the MEDICAL RECORD NUMBER Referred by Dr. Zenia Resides for eval of ret heme OD   CURRENT MEDICATIONS: No current outpatient medications on file. (Ophthalmic Drugs)   No current facility-administered medications for this visit. (Ophthalmic Drugs)   Current Outpatient Medications (Other)  Medication Sig  . ACCU-CHEK AVIVA PLUS test strip USE AS DIRECTED  . aspirin EC 81 MG tablet Take 1 tablet (81 mg total) by mouth every other day.  Marland Kitchen atorvastatin (LIPITOR) 40 MG tablet TAKE 1 TABLET BY MOUTH ONCE DAILY AT  6PM  . Cyanocobalamin 1000 MCG/ML KIT Inject 1,000 mcg into the muscle every 30 (thirty) days.  Marland Kitchen levothyroxine (SYNTHROID) 88 MCG tablet Take 1 tablet (88 mcg total) by mouth daily.  . metFORMIN (GLUCOPHAGE) 500 MG tablet TAKE 1 TABLET BY MOUTH TWICE DAILY WITH MEALS  . Multiple Vitamins-Minerals (PRESERVISION AREDS 2 PO) Take 1 tablet by mouth 2 (two) times daily.   . Needles & Syringes (1CC TB SYRINGE) MISC 1 Units by Does not apply route as needed.  Marland Kitchen  NITROGLYCERIN PO Take 0.4 mg by mouth every 5 (five) minutes as needed (chest pain).    . ramipril (ALTACE) 10 MG capsule Take 1 capsule (10 mg total) by mouth daily.  . valACYclovir (VALTREX) 500 MG tablet Take 500 mg by mouth daily.  Marland Kitchen VITAMIN D PO Take 1,000 Units by mouth daily at 2 PM.   No current facility-administered medications for this visit. (Other)      REVIEW OF SYSTEMS: ROS    Positive for: Gastrointestinal, Musculoskeletal, Endocrine, Cardiovascular, Eyes   Negative for: Constitutional, Neurological, Skin, Genitourinary, HENT, Respiratory, Psychiatric, Allergic/Imm, Heme/Lymph   Last edited by Doneen Poisson on 12/10/2020  8:01 AM. (History)       ALLERGIES Allergies  Allergen Reactions  . Codeine Itching    After 3 days  . Dilaudid [Hydromorphone Hcl] Itching    After 3 days    PAST MEDICAL HISTORY Past Medical History:  Diagnosis Date  . Aortic stenosis   . Arthritis   . B12 deficiency   . Diabetes mellitus    type 2  . Heart murmur    aortic stenosis  . Hyperlipidemia   . Hypothyroidism   . IBS (irritable bowel syndrome)   . NSVD (normal spontaneous vaginal delivery)    X 4  . Pain  INTRASCAPULAR PAIN  . SAB (spontaneous abortion)    X 2  . Thyroid disease    HYPOTHYROIDISM   Past Surgical History:  Procedure Laterality Date  . ABDOMINAL HYSTERECTOMY  1977   TAH  (FIBROIDS)  . CATARACT EXTRACTION Bilateral   . DILATION AND CURETTAGE OF UTERUS    . DOPPLER ECHOCARDIOGRAPHY  2009   HAD AN ECHOCARDIOGRAM THAT SHOWED MODERATE MITRAL  ANNULAR CALCIFICATION AND A NORMAL EJECTION FRACTION OF 55%  . EYE SURGERY Bilateral    Cat Sx  . OTHER SURGICAL HISTORY     HYSTERECTOMY  . SHOULDER SURGERY     RIGHT SHOULDER SURGERY  . THYROIDECTOMY    . 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 TOTAL KNEE ARTHROPLASTY Left 09/10/2018   Procedure: TOTAL KNEE ARTHROPLASTY;  Surgeon: Melrose Nakayama, MD;  Location: [facility];  Service: Orthopedics;   Laterality: Left;    FAMILY HISTORY Family History  Problem Relation Age of Onset  . Osteoporosis Mother   . Heart disease Mother   . Macular degeneration Mother   . Heart disease Father   . Cancer Father        BRAIN TUMOR  . Diabetes Maternal Aunt   . Diabetes Maternal Uncle   . Cancer Brother        BRAIN TUMOR  . Colon cancer Neg Hx   . Breast cancer Neg Hx     SOCIAL HISTORY Social History   Tobacco Use  . Smoking status: Never Smoker  . Smokeless tobacco: Never Used  Vaping Use  . Vaping Use: Never used  Substance Use Topics  . Alcohol use: No    Alcohol/week: 0.0 standard drinks  . Drug use: No         OPHTHALMIC EXAM:  Base Eye Exam    Visual Acuity (Snellen - Linear)      Right Left   Dist cc 20/70 +2 20/25 -2   Dist ph cc NI NI       Tonometry (Tonopen, 8:05 AM)      Right Left   Pressure 13 14       Pupils      Dark Light Shape React APD   Right 4 3 Round Brisk 0   Left 4 3 Round Brisk 0       Visual Fields      Left Right    Full Full       Extraocular Movement      Right Left    Full Full       Neuro/Psych    Oriented x3: Yes   Mood/Affect: Normal       Dilation    Both eyes: 1.0% Mydriacyl, 2.5% Phenylephrine @ 8:05 AM        Slit Lamp and Fundus Exam    Slit Lamp Exam      Right Left   Lids/Lashes Dermatochalasis - upper lid Dermatochalasis - upper lid   Conjunctiva/Sclera White and quiet White and quiet   Cornea 1+ Punctate epithelial erosions, fine endo pigment 1-2+ fine Punctate epithelial erosions, fine endo pigment   Anterior Chamber Deep and quiet Deep and quiet   Iris Round and dilated Round and dilated   Lens Toric PC IOL with marks at 1200 and 0600, 1+ Posterior capsular opacification PC IOL in good position, 1-2+ Posterior capsular opacification   Vitreous Vitreous syneresis, trace pigment, Posterior vitreous detachment, vitreous condensations Vitreous syneresis, Posterior vitreous detachment, vitreous  condensations  Fundus Exam      Right Left   Disc mild Pallor, Sharp rim, +cupping, PPA mild Pallor, Sharp rim, +cupping, PPA   C/D Ratio 0.7 0.7   Macula Blunted foveal reflex, central CNV with subretinal heme along inferior aspect -- improving, RPE mottling, clumping and atrophy, [place] Flat, Blunted foveal reflex, Drusen, RPE mottling, clumping and atrophy, No heme or edema   Vessels attenuated, Tortuous, AV crossing changes attenuated, Tortuous, AV crossing changes   Periphery Attached, reticular degeneration, paving stone degeneration IT quad, No heme  Attached, reticular degeneration, mild inferior paving stone degeneration          IMAGING AND PROCEDURES  Imaging and Procedures for 12/10/2020  OCT, Retina - OU - Both Eyes       Right Eye Quality was good. Central Foveal Thickness: 458. Progression has improved. Findings include abnormal foveal contour, intraretinal fluid, subretinal fluid, subretinal hyper-reflective material, pigment epithelial detachment, retinal drusen  (interval improvement in central edema, foveal contour and SRF).   Left Eye Quality was good. Central Foveal Thickness: 227. Progression has been stable. Findings include normal foveal contour, no IRF, no SRF, abnormal foveal contour, outer retinal atrophy.   Notes *Images captured and stored on drive  Diagnosis / Impression:  OD: interval improvement in central edema, foveal contour and SRF OS: non-exu ARMD   Clinical management:  See below  Abbreviations: NFP - Normal foveal profile. CME - cystoid macular edema. PED - pigment epithelial detachment. IRF - intraretinal fluid. SRF - subretinal fluid. EZ - ellipsoid zone. ERM - epiretinal membrane. ORA - outer retinal atrophy. ORT - outer retinal tubulation. SRHM - subretinal hyper-reflective material. IRHM - intraretinal hyper-reflective material        Intravitreal Injection, Pharmacologic Agent - OD - Right Eye       Time Out 12/10/2020.  8:36 AM. Confirmed correct patient, procedure, site, and patient consented.   Anesthesia Topical anesthesia was used. Anesthetic medications included Lidocaine 2%, Proparacaine 0.5%.   Procedure Preparation included 5% betadine to ocular surface, eyelid speculum (32g). A (32g) needle was used.   Injection:  1.25 mg Bevacizumab (AVASTIN) 1.64m/0.05mL SOLN   NDC: 572094-709-62 Lot: [id number]' , Expiration date: 01/19/2021   Route: Intravitreal, Site: Right Eye, Waste: 0 mL  Post-op Post injection exam found visual acuity of at least counting fingers. The patient tolerated the procedure well. There were no complications. The patient received written and verbal post procedure care education. Post injection medications were not given.               ASSESSMENT/PLAN:    ICD-10-CM   1. Exudative age-related macular degeneration of right eye with active choroidal neovascularization (HCC)  H35.3211 Intravitreal Injection, Pharmacologic Agent - OD - Right Eye    Bevacizumab (AVASTIN) SOLN 1.25 mg  2. Retinal edema  H35.81 OCT, Retina - OU - Both Eyes  3. Intermediate stage nonexudative age-related macular degeneration of left eye  H35.3122   4. Essential hypertension  I10   5. Hypertensive retinopathy of both eyes  H35.033   6. Pseudophakia, both eyes  Z96.1     1,2. Exudative age related macular degeneration, OD  - s/p IVA OD #1 (05.06.22)  - OCT shows central CNV w/ +IRF and SRF -- improved  - recommend IVA OD #2 today, 06.03.22 - pt wishes to be treated with IVA - RBA of procedure discussed, questions answered - IVA informed consent obtained and signed, 05.06.22 (OD) - see procedure note  - f/u in 4  wks -- DFE/OCT, possible injection  3. Age related macular degeneration, non-exudative, OS  - The incidence, anatomy, and pathology of dry AMD, risk of progression, and the AREDS and AREDS 2 studies including smoking risks discussed with patient.  - Recommend amsler grid  monitoring  - f/u 3 months  4,5. Hypertensive retinopathy OU - discussed importance of tight BP control - monitor  6. Pseudophakia OU  - s/p CE/IOL  - IOL in good position, doing well  - monitor   Ophthalmic Meds Ordered this visit:  Meds ordered this encounter  Medications  . Bevacizumab (AVASTIN) SOLN 1.25 mg       Return in about 4 weeks (around 01/07/2021) for f/u exu ARMD OD, DFE, OCT.  There are no Patient Instructions on file for this visit.   Explained the diagnoses, plan, and follow up with the patient and they expressed understanding.  Patient expressed understanding of the importance of proper follow up care.   This document serves as a record of services personally performed by Gardiner Sleeper, MD, PhD. It was created on their behalf by Leonie Douglas, an ophthalmic technician. The creation of this record is the provider's dictation and/or activities during the visit.    Electronically signed by: Leonie Douglas COA, 12/10/20  9:40 AM   This document serves as a record of services personally performed by Gardiner Sleeper, MD, PhD. It was created on their behalf by San Jetty. Owens Shark, OA an ophthalmic technician. The creation of this record is the provider's dictation and/or activities during the visit.    Electronically signed by: San Jetty. Owens Shark, [place] 06.03.2022 9:40 AM   Gardiner Sleeper, M.D., Ph.D. Diseases & Surgery of the Retina and [place] 12/10/2020  I have reviewed the above documentation for accuracy and completeness, and I agree with the above. Gardiner Sleeper, M.D., Ph.D. 12/10/20 9:40 AM   Abbreviations: M myopia (nearsighted); A astigmatism; H hyperopia (farsighted); P presbyopia; Mrx spectacle prescription;  CTL contact lenses; OD right eye; OS left eye; OU both eyes  XT exotropia; ET esotropia; PEK punctate epithelial keratitis; PEE punctate epithelial erosions; DES dry eye syndrome; MGD meibomian gland dysfunction; ATs  artificial tears; PFAT's preservative free artificial tears; [place] nuclear sclerotic cataract; PSC posterior subcapsular cataract; ERM epi-retinal membrane; PVD posterior vitreous detachment; RD retinal detachment; DM diabetes mellitus; DR diabetic retinopathy; NPDR non-proliferative diabetic retinopathy; PDR proliferative diabetic retinopathy; CSME clinically significant macular edema; DME diabetic macular edema; dbh dot blot hemorrhages; CWS cotton wool spot; POAG primary open angle glaucoma; C/D cup-to-disc ratio; HVF humphrey visual field; GVF goldmann visual field; OCT optical coherence tomography; IOP intraocular pressure; BRVO Branch retinal vein occlusion; CRVO central retinal vein occlusion; CRAO central retinal artery occlusion; BRAO branch retinal artery occlusion; RT retinal tear; SB scleral buckle; PPV pars plana vitrectomy; VH Vitreous hemorrhage; PRP panretinal laser photocoagulation; IVK intravitreal kenalog; VMT vitreomacular traction; MH Macular hole;  NVD neovascularization of the disc; NVE neovascularization elsewhere; AREDS age related eye disease study; ARMD age related macular degeneration; POAG primary open angle glaucoma; EBMD epithelial/anterior basement membrane dystrophy; ACIOL anterior chamber intraocular lens; IOL intraocular lens; PCIOL posterior chamber intraocular lens; Phaco/IOL phacoemulsification with intraocular lens placement; [place] photorefractive keratectomy; LASIK laser assisted in situ keratomileusis; HTN hypertension; DM diabetes mellitus; COPD chronic obstructive pulmonary disease

## 2020-12-10 ENCOUNTER — Other Ambulatory Visit: Payer: Self-pay

## 2020-12-10 ENCOUNTER — Ambulatory Visit (INDEPENDENT_AMBULATORY_CARE_PROVIDER_SITE_OTHER): Payer: Medicare PPO | Admitting: Ophthalmology

## 2020-12-10 ENCOUNTER — Encounter (INDEPENDENT_AMBULATORY_CARE_PROVIDER_SITE_OTHER): Payer: Self-pay | Admitting: Ophthalmology

## 2020-12-10 DIAGNOSIS — Z961 Presence of intraocular lens: Secondary | ICD-10-CM | POA: Diagnosis not present

## 2020-12-10 DIAGNOSIS — H3581 Retinal edema: Secondary | ICD-10-CM | POA: Diagnosis not present

## 2020-12-10 DIAGNOSIS — H35033 Hypertensive retinopathy, bilateral: Secondary | ICD-10-CM | POA: Diagnosis not present

## 2020-12-10 DIAGNOSIS — I10 Essential (primary) hypertension: Secondary | ICD-10-CM | POA: Diagnosis not present

## 2020-12-10 DIAGNOSIS — H353122 Nonexudative age-related macular degeneration, left eye, intermediate dry stage: Secondary | ICD-10-CM | POA: Diagnosis not present

## 2020-12-10 DIAGNOSIS — H353211 Exudative age-related macular degeneration, right eye, with active choroidal neovascularization: Secondary | ICD-10-CM | POA: Diagnosis not present

## 2020-12-10 MED ORDER — BEVACIZUMAB CHEMO INJECTION 1.25MG/0.05ML SYRINGE FOR KALEIDOSCOPE
1.2500 mg | INTRAVITREAL | Status: AC | PRN
  Administered 2020-12-10: 1.25 mg via INTRAVITREAL

## 2020-12-14 ENCOUNTER — Other Ambulatory Visit: Payer: Self-pay | Admitting: Family Medicine

## 2020-12-14 DIAGNOSIS — E119 Type 2 diabetes mellitus without complications: Secondary | ICD-10-CM

## 2021-01-04 NOTE — Progress Notes (Signed)
[facility] Note  01/07/2021     CHIEF COMPLAINT Patient presents for Retina Follow Up   HISTORY OF PRESENT ILLNESS: Dana Sandoval is a 80 y.o. 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  This started weeks ago.  Severity is moderate.  Duration of weeks.  Since onset it is stable.  I, the attending physician,  performed the HPI with the patient and updated documentation appropriately.        Comments   Pt states she has not seen an improvement in her vision OD since starting Tx.  Pt states vision OU seems to be the same.  Pt denies eye pain or discomfort and denies any new or worsening floaters or fol OU.      Last edited by Bernarda Caffey, MD on 01/07/2021  8:39 AM.     pt states she can't tell any difference in her vision, she states the circle she sees in her vision comes and goes  Referring physician: No referring provider defined for this encounter.  HISTORICAL INFORMATION:   Selected notes from the MEDICAL RECORD NUMBER Referred by Dr. Zenia Resides for eval of ret heme OD   CURRENT MEDICATIONS: No current outpatient medications on file. (Ophthalmic Drugs)   No current facility-administered medications for this visit. (Ophthalmic Drugs)   Current Outpatient Medications (Other)  Medication Sig   ACCU-CHEK AVIVA PLUS test strip USE AS DIRECTED   aspirin EC 81 MG tablet Take 1 tablet (81 mg total) by mouth every other day.   atorvastatin (LIPITOR) 40 MG tablet TAKE 1 TABLET BY MOUTH ONCE DAILY AT  6PM   Cyanocobalamin 1000 MCG/ML KIT Inject 1,000 mcg into the muscle every 30 (thirty) days.   levothyroxine (SYNTHROID) 88 MCG tablet Take 1 tablet (88 mcg total) by mouth daily.   metFORMIN (GLUCOPHAGE) 500 MG tablet TAKE 1 TABLET BY MOUTH TWICE A DAY WITH MEALS   Multiple Vitamins-Minerals (PRESERVISION AREDS 2 PO) Take 1 tablet by mouth 2 (two) times daily.     Needles & Syringes (1CC TB SYRINGE) MISC 1 Units by Does not apply route as needed.   NITROGLYCERIN PO Take 0.4 mg by mouth every 5 (five) minutes as needed (chest pain).     ramipril (ALTACE) 10 MG capsule Take 1 capsule (10 mg total) by mouth daily.   valACYclovir (VALTREX) 500 MG tablet Take 500 mg by mouth daily.   VITAMIN D PO Take 1,000 Units by mouth daily at 2 PM.   No current facility-administered medications for this visit. (Other)      REVIEW OF SYSTEMS: ROS   Positive for: Gastrointestinal, Musculoskeletal, Endocrine, Cardiovascular, Eyes Negative for: Constitutional, Neurological, Skin, Genitourinary, HENT, Respiratory, Psychiatric, Allergic/Imm, Heme/Lymph Last edited by Doneen Poisson on 01/07/2021  7:56 AM.        ALLERGIES Allergies  Allergen Reactions   Codeine Itching    After 3 days   Dilaudid [Hydromorphone Hcl] Itching    After 3 days    PAST MEDICAL HISTORY Past Medical History:  Diagnosis Date   Aortic stenosis    Arthritis    B12 deficiency    Diabetes mellitus    type 2   Heart murmur    aortic stenosis   Hyperlipidemia    Hypothyroidism    IBS (irritable bowel syndrome)    NSVD (normal  spontaneous vaginal delivery)    X 4   Pain    INTRASCAPULAR PAIN   SAB (spontaneous abortion)    X 2   Thyroid disease    HYPOTHYROIDISM   Past Surgical History:  Procedure Laterality Date   ABDOMINAL HYSTERECTOMY  1977   TAH  (FIBROIDS)   CATARACT EXTRACTION Bilateral    DILATION AND CURETTAGE OF UTERUS     DOPPLER ECHOCARDIOGRAPHY  2009   HAD AN ECHOCARDIOGRAM THAT SHOWED MODERATE MITRAL  ANNULAR CALCIFICATION AND A NORMAL EJECTION FRACTION OF 55%   EYE SURGERY Bilateral    Cat Sx   OTHER SURGICAL HISTORY     HYSTERECTOMY   SHOULDER SURGERY     RIGHT SHOULDER SURGERY   THYROIDECTOMY     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   RIGHT   TOTAL KNEE ARTHROPLASTY Left 09/10/2018   Procedure: TOTAL KNEE ARTHROPLASTY;  Surgeon: Melrose Nakayama,  MD;  Location: [facility];  Service: Orthopedics;  Laterality: Left;    FAMILY HISTORY Family History  Problem Relation Age of Onset   Osteoporosis Mother    Heart disease Mother    Macular degeneration Mother    Heart disease Father    Cancer Father        BRAIN TUMOR   Diabetes Maternal Aunt    Diabetes Maternal Uncle    Cancer Brother        BRAIN TUMOR   Colon cancer Neg Hx    Breast cancer Neg Hx     SOCIAL HISTORY Social History   Tobacco Use   Smoking status: Never   Smokeless tobacco: Never  Vaping Use   Vaping Use: Never used  Substance Use Topics   Alcohol use: No    Alcohol/week: 0.0 standard drinks   Drug use: No         OPHTHALMIC EXAM:  Base Eye Exam     Visual Acuity (Snellen - Linear)       Right Left   Dist [place] 20/80 -2 20/30 -   Dist ph [place] 20/25 -2         Tonometry (Tonopen, 7:59 AM)       Right Left   Pressure 13 16         Pupils       Dark Light Shape React APD   Right 3 2 Round Brisk 0   Left 3 2 Round Brisk 0         Visual Fields       Left Right    Full Full         Extraocular Movement       Right Left    Full Full         Neuro/Psych     Oriented x3: Yes   Mood/Affect: Normal         Dilation     Both eyes: 1.0% Mydriacyl, 2.5% Phenylephrine @ 7:59 AM           Slit Lamp and Fundus Exam     Slit Lamp Exam       Right Left   Lids/Lashes Dermatochalasis - upper lid Dermatochalasis - upper lid   Conjunctiva/Sclera White and quiet White and quiet   Cornea 1+ Punctate epithelial erosions, fine endo pigment 1-2+ fine Punctate epithelial erosions, fine endo pigment   Anterior Chamber Deep and quiet Deep and quiet   Iris Round and dilated Round and dilated   Lens Toric PC IOL with marks at  1200 and 0600, 1+ Posterior capsular opacification PC IOL in good position, 1-2+ Posterior capsular opacification   Vitreous Vitreous syneresis, trace pigment, Posterior vitreous detachment, vitreous  condensations Vitreous syneresis, Posterior vitreous detachment, vitreous condensations         Fundus Exam       Right Left   Disc mild Pallor, Sharp rim, +cupping, PPA mild Pallor, Sharp rim, +cupping, PPA   C/D Ratio 0.7 0.7   Macula Blunted foveal reflex, central CNV with subretinal heme along inferior aspect -- improving, RPE mottling, clumping and atrophy, [place] Flat, Blunted foveal reflex, Dr[place]en, RPE mottling, clumping and atrophy, No heme or edema   Vessels attenuated, Tortuous, AV crossing changes attenuated, Tortuous, AV crossing changes   Periphery Attached, reticular degeneration, paving stone degeneration IT quad, No heme  Attached, reticular degeneration, mild inferior paving stone degeneration            IMAGING AND PROCEDURES  Imaging and Procedures for 01/07/2021  OCT, Retina - OU - Both Eyes       Right Eye Quality was good. Central Foveal Thickness: 388. Progression has improved. Findings include abnormal foveal contour, intraretinal fluid, subretinal fluid, subretinal hyper-reflective material, pigment epithelial detachment, retinal drusen , outer retinal atrophy (interval improvement in SRF overlying PED).   Left Eye Quality was good. Central Foveal Thickness: 224. Progression has been stable. Findings include normal foveal contour, no IRF, no SRF, abnormal foveal contour, outer retinal atrophy.   Notes *Images captured and stored on drive  Diagnosis / Impression:  OD: ex ARMD - interval improvement in SRF overlying PED OS: non-exu ARMD   Clinical management:  See below  Abbreviations: NFP - Normal foveal profile. CME - cystoid macular edema. PED - pigment epithelial detachment. IRF - intraretinal fluid. SRF - subretinal fluid. EZ - ellipsoid zone. ERM - epiretinal membrane. ORA - outer retinal atrophy. ORT - outer retinal tubulation. SRHM - subretinal hyper-reflective material. IRHM - intraretinal hyper-reflective material      Intravitreal  Injection, Pharmacologic Agent - OD - Right Eye       Time Out 01/07/2021. 8:08 AM. Confirmed correct patient, procedure, site, and patient consented.   Anesthesia Topical anesthesia was used. Anesthetic medications included Lidocaine 2%, Proparacaine 0.5%.   Procedure Preparation included 5% betadine to ocular surface, eyelid speculum (32g). A (32g) needle was used.   Injection: 1.25 mg Bevacizumab 1.31m/0.05ml   Route: Intravitreal, Site: Right Eye   NDC:: 46503-546-56 Lot: [id number]' , Expiration date: 02/16/2021, Waste: 0 mL   Post-op Post injection exam found visual acuity of at least counting fingers. The patient tolerated the procedure well. There were no complications. The patient received written and verbal post procedure care education. Post injection medications were not given.             ASSESSMENT/PLAN:    ICD-10-CM   1. Exudative age-related macular degeneration of right eye with active choroidal neovascularization (HCC)  H35.3211 Intravitreal Injection, Pharmacologic Agent - OD - Right Eye    Bevacizumab (AVASTIN) SOLN 1.25 mg    2. Retinal edema  H35.81 OCT, Retina - OU - Both Eyes    3. Intermediate stage nonexudative age-related macular degeneration of left eye  H35.3122     4. Essential hypertension  I10     5. Hypertensive retinopathy of both eyes  H35.033     6. Pseudophakia, both eyes  Z96.1      1,2. Exudative age related macular degeneration, OD  - s/p IVA OD #1 (05.06.22), #2 (  06.03.22)  - OCT shows interval improvement in SRF overlying PED  - recommend IVA OD #3 today, 07.01.22 - pt wishes to be treated with IVA - RBA of procedure discussed, questions answered - IVA informed consent obtained and signed, 05.06.22 (OD) - see procedure note  - f/u in 4 wks -- DFE/OCT, possible injection  3. Age related macular degeneration, non-exudative, OS  - The incidence, anatomy, and pathology of dry AMD, risk of progression, and the AREDS and AREDS 2  studies including smoking risks discussed with patient.  - Recommend amsler grid monitoring  - f/u 3 months  4,5. Hypertensive retinopathy OU - discussed importance of tight BP control - monitor  6. Pseudophakia OU  - s/p CE/IOL  - IOL in good position, doing well  - monitor  Ophthalmic Meds Ordered this visit:  Meds ordered this encounter  Medications   Bevacizumab (AVASTIN) SOLN 1.25 mg      Return in about 4 weeks (around 02/04/2021) for f/u exu ARMD OD, DFE, OCT.  There are no Patient Instructions on file for this visit.   Explained the diagnoses, plan, and follow up with the patient and they expressed understanding.  Patient expressed understanding of the importance of proper follow up care.   This document serves as a record of services personally performed by Gardiner Sleeper, MD, PhD. It was created on their behalf by San Jetty. Owens Shark, OA an ophthalmic technician. The creation of this record is the provider's dictation and/or activities during the visit.    Electronically signed by: San Jetty. Owens Shark, [place] 06.28.2022 8:45 AM  Gardiner Sleeper, M.D., Ph.D. Diseases & Surgery of the Retina and Vitreous [facility]  I have reviewed the above documentation for accuracy and completeness, and I agree with the above. Gardiner Sleeper, M.D., Ph.D. 01/07/21 8:45 AM   Abbreviations: M myopia (nearsighted); A astigmatism; H hyperopia (farsighted); P presbyopia; Mrx spectacle prescription;  CTL contact lenses; OD right eye; OS left eye; OU both eyes  XT exotropia; ET esotropia; PEK punctate epithelial keratitis; PEE punctate epithelial erosions; DES dry eye syndrome; MGD meibomian gland dysfunction; ATs artificial tears; PFAT's preservative free artificial tears; [place] nuclear sclerotic cataract; PSC posterior subcapsular cataract; ERM epi-retinal membrane; PVD posterior vitreous detachment; RD retinal detachment; DM diabetes mellitus; DR diabetic retinopathy; NPDR  non-proliferative diabetic retinopathy; PDR proliferative diabetic retinopathy; CSME clinically significant macular edema; DME diabetic macular edema; dbh dot blot hemorrhages; CWS cotton wool spot; POAG primary open angle glaucoma; C/D cup-to-disc ratio; HVF humphrey visual field; GVF goldmann visual field; OCT optical coherence tomography; IOP intraocular pressure; BRVO Branch retinal vein occlusion; CRVO central retinal vein occlusion; CRAO central retinal artery occlusion; BRAO branch retinal artery occlusion; RT retinal tear; SB scleral buckle; PPV pars plana vitrectomy; VH Vitreous hemorrhage; PRP panretinal laser photocoagulation; IVK intravitreal kenalog; VMT vitreomacular traction; MH Macular hole;  NVD neovascularization of the disc; NVE neovascularization elsewhere; AREDS age related eye disease study; ARMD age related macular degeneration; POAG primary open angle glaucoma; EBMD epithelial/anterior basement membrane dystrophy; ACIOL anterior chamber intraocular lens; IOL intraocular lens; PCIOL posterior chamber intraocular lens; Phaco/IOL phacoemulsification with intraocular lens placement; [place] photorefractive keratectomy; LASIK laser assisted in situ keratomileusis; HTN hypertension; DM diabetes mellitus; COPD chronic obstructive pulmonary disease

## 2021-01-07 ENCOUNTER — Encounter (INDEPENDENT_AMBULATORY_CARE_PROVIDER_SITE_OTHER): Payer: Self-pay | Admitting: Ophthalmology

## 2021-01-07 ENCOUNTER — Ambulatory Visit (INDEPENDENT_AMBULATORY_CARE_PROVIDER_SITE_OTHER): Payer: Medicare PPO | Admitting: Ophthalmology

## 2021-01-07 ENCOUNTER — Other Ambulatory Visit: Payer: Self-pay

## 2021-01-07 DIAGNOSIS — H3581 Retinal edema: Secondary | ICD-10-CM

## 2021-01-07 DIAGNOSIS — H353122 Nonexudative age-related macular degeneration, left eye, intermediate dry stage: Secondary | ICD-10-CM

## 2021-01-07 DIAGNOSIS — H353211 Exudative age-related macular degeneration, right eye, with active choroidal neovascularization: Secondary | ICD-10-CM

## 2021-01-07 DIAGNOSIS — H35033 Hypertensive retinopathy, bilateral: Secondary | ICD-10-CM | POA: Diagnosis not present

## 2021-01-07 DIAGNOSIS — I10 Essential (primary) hypertension: Secondary | ICD-10-CM

## 2021-01-07 DIAGNOSIS — Z961 Presence of intraocular lens: Secondary | ICD-10-CM | POA: Diagnosis not present

## 2021-01-07 MED ORDER — BEVACIZUMAB CHEMO INJECTION 1.25MG/0.05ML SYRINGE FOR KALEIDOSCOPE
1.2500 mg | INTRAVITREAL | Status: AC | PRN
  Administered 2021-01-07: 1.25 mg via INTRAVITREAL

## 2021-02-01 NOTE — Progress Notes (Signed)
[facility] Note  02/04/2021     CHIEF COMPLAINT Patient presents for Retina Follow Up   HISTORY OF PRESENT ILLNESS: Dana Sandoval is a 80 y.o. 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  Duration of 4 weeks.  Since onset it is stable.  I, the attending physician,  performed the HPI with the patient and updated documentation appropriately.        Comments   4 week follow up Exu ARMD OD-  Vision appears stable OU, denies pain or discomfort.  BS 125, 2 days ago A1C 6.5, 11/2020      Last edited by Bernarda Caffey, MD on 02/04/2021  8:44 AM.    No change in [place] noticed.  Occasionally noticed brief blind spots in vision due to contrast.  Referring physician: Debbra Riding, MD [address] [place],  [place] [address]  HISTORICAL INFORMATION:   Selected notes from the MEDICAL RECORD NUMBER Referred by Dr. Zenia Resides for eval of ret heme OD   CURRENT MEDICATIONS: No current outpatient medications on file. (Ophthalmic Drugs)   No current facility-administered medications for this visit. (Ophthalmic Drugs)   Current Outpatient Medications (Other)  Medication Sig   aspirin EC 81 MG tablet Take 1 tablet (81 mg total) by mouth every other day.   atorvastatin (LIPITOR) 40 MG tablet TAKE 1 TABLET BY MOUTH ONCE DAILY AT  6PM   Cyanocobalamin 1000 MCG/ML KIT Inject 1,000 mcg into the muscle every 30 (thirty) days.   levothyroxine (SYNTHROID) 88 MCG tablet Take 1 tablet (88 mcg total) by mouth daily.   metFORMIN (GLUCOPHAGE) 500 MG tablet TAKE 1 TABLET BY MOUTH TWICE A DAY WITH MEALS   Multiple Vitamins-Minerals (PRESERVISION AREDS 2 PO) Take 1 tablet by mouth 2 (two) times daily.    NITROGLYCERIN PO Take 0.4 mg by mouth every 5 (five) minutes as needed (chest pain).     ramipril (ALTACE) 10 MG capsule Take 1 capsule (10 mg total) by mouth daily.   valACYclovir  (VALTREX) 500 MG tablet Take 500 mg by mouth daily.   VITAMIN D PO Take 1,000 Units by mouth daily at 2 PM.   ACCU-CHEK AVIVA PLUS test strip USE AS DIRECTED   Needles & Syringes (1CC TB SYRINGE) MISC 1 Units by Does not apply route as needed.   No current facility-administered medications for this visit. (Other)      REVIEW OF SYSTEMS: ROS   Positive for: Gastrointestinal, Musculoskeletal, Endocrine, Cardiovascular, Eyes Negative for: Constitutional, Neurological, Skin, Genitourinary, HENT, Respiratory, Psychiatric, Allergic/Imm, Heme/Lymph Last edited by Leonie Douglas, COA on 02/04/2021  8:14 AM.         ALLERGIES Allergies  Allergen Reactions   Codeine Itching    After 3 days   Dilaudid [Hydromorphone Hcl] Itching    After 3 days    PAST MEDICAL HISTORY Past Medical History:  Diagnosis Date   Aortic stenosis    Arthritis    B12 deficiency    Diabetes mellitus    type 2   Heart murmur    aortic stenosis   Hyperlipidemia    Hypothyroidism    IBS (irritable bowel syndrome)    NSVD (normal spontaneous vaginal delivery)    X 4   Pain    INTRASCAPULAR PAIN   SAB (spontaneous abortion)  X 2   Thyroid disease    HYPOTHYROIDISM   Past Surgical History:  Procedure Laterality Date   ABDOMINAL HYSTERECTOMY  1977   TAH  (FIBROIDS)   CATARACT EXTRACTION Bilateral    DILATION AND CURETTAGE OF UTERUS     DOPPLER ECHOCARDIOGRAPHY  2009   HAD AN ECHOCARDIOGRAM THAT SHOWED MODERATE MITRAL  ANNULAR CALCIFICATION AND A NORMAL EJECTION FRACTION OF 55%   EYE SURGERY Bilateral    Cat Sx   OTHER SURGICAL HISTORY     HYSTERECTOMY   SHOULDER SURGERY     RIGHT SHOULDER SURGERY   THYROIDECTOMY     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   RIGHT   TOTAL KNEE ARTHROPLASTY Left 09/10/2018   Procedure: TOTAL KNEE ARTHROPLASTY;  Surgeon: Melrose Nakayama, MD;  Location: [facility];  Service: Orthopedics;  Laterality: Left;    FAMILY HISTORY Family History  Problem Relation Age of  Onset   Osteoporosis Mother    Heart disease Mother    Macular degeneration Mother    Heart disease Father    Cancer Father        BRAIN TUMOR   Diabetes Maternal Aunt    Diabetes Maternal Uncle    Cancer Brother        BRAIN TUMOR   Colon cancer Neg Hx    Breast cancer Neg Hx     SOCIAL HISTORY Social History   Tobacco Use   Smoking status: Never   Smokeless tobacco: Never  Vaping Use   Vaping Use: Never used  Substance Use Topics   Alcohol use: No    Alcohol/week: 0.0 standard drinks   Drug use: No         OPHTHALMIC EXAM:  Base Eye Exam     Visual Acuity (Snellen - Linear)       Right Left   Dist [place] 20/70 +2 20/30 -1   Dist ph [place] NI NI         Tonometry (Tonopen, 8:21 AM)       Right Left   Pressure 19 20  Checked IOP x2 OD 19 OS 20        Pupils       Dark Light Shape React APD   Right 4 3 Round Brisk None   Left 4 3 Round Brisk None         Visual Fields (Counting fingers)       Left Right    Full Full         Extraocular Movement       Right Left    Full Full         Neuro/Psych     Oriented x3: Yes   Mood/Affect: Normal         Dilation     Both eyes: 1.0% Mydriacyl, 2.5% Phenylephrine @ 8:20 AM           Slit Lamp and Fundus Exam     Slit Lamp Exam       Right Left   Lids/Lashes Dermatochalasis - upper lid Dermatochalasis - upper lid   Conjunctiva/Sclera White and quiet White and quiet   Cornea 1+ Punctate epithelial erosions, fine endo pigment 1-2+ fine Punctate epithelial erosions, fine endo pigment   Anterior Chamber Deep and quiet Deep and quiet   Iris Round and dilated Round and dilated   Lens Toric PC IOL with marks at 1200 and 0600, 1+ Posterior capsular opacification PC IOL in good position, 1-2+ Posterior capsular opacification  Vitreous Vitreous syneresis, trace pigment, Posterior vitreous detachment, vitreous condensations Vitreous syneresis, Posterior vitreous detachment, vitreous  condensations         Fundus Exam       Right Left   Disc mild Pallor, Sharp rim, +cupping, PPA mild Pallor, Sharp rim, +cupping, PPA   C/D Ratio 0.7 0.7   Macula Blunted foveal reflex, central CNV with subretinal heme along inferior aspect -- resolved, RPE mottling, clumping and atrophy, Drusen, persistent shallow SRF overlying CNV Flat, Blunted foveal reflex, Drusen, RPE mottling, clumping and atrophy, No heme or edema   Vessels attenuated, Tortuous, AV crossing changes attenuated, Tortuous, AV crossing changes   Periphery Attached, reticular degeneration, paving stone degeneration IT quad, No heme  Attached, reticular degeneration, mild inferior paving stone degeneration            IMAGING AND PROCEDURES  Imaging and Procedures for 02/04/2021  OCT, Retina - OU - Both Eyes       Right Eye Quality was good. Central Foveal Thickness: 379. Progression has been stable. Findings include abnormal foveal contour, intraretinal fluid, subretinal fluid, subretinal hyper-reflective material, pigment epithelial detachment, retinal drusen , outer retinal atrophy (Persistent?/interval improvement in SRF overlying PED).   Left Eye Quality was good. Central Foveal Thickness: 227. Progression has been stable. Findings include normal foveal contour, no IRF, no SRF, outer retinal atrophy, retinal drusen .   Notes *Images captured and stored on drive  Diagnosis / Impression:  OD: ex ARMD - Persistent / ?interval improvement in SRF overlying PED OS: non-exu ARMD   Clinical management:  See below  Abbreviations: NFP - Normal foveal profile. CME - cystoid macular edema. PED - pigment epithelial detachment. IRF - intraretinal fluid. SRF - subretinal fluid. EZ - ellipsoid zone. ERM - epiretinal membrane. ORA - outer retinal atrophy. ORT - outer retinal tubulation. SRHM - subretinal hyper-reflective material. IRHM - intraretinal hyper-reflective material      Intravitreal Injection,  Pharmacologic Agent - OD - Right Eye       Time Out 02/04/2021. 8:52 AM. Confirmed correct patient, procedure, site, and patient consented.   Anesthesia Topical anesthesia was used. Anesthetic medications included Lidocaine 2%, Proparacaine 0.5%.   Procedure Preparation included 5% betadine to ocular surface, eyelid speculum (32g). A (32g) needle was used.   Injection: 1.25 mg Bevacizumab 1.31m/0.05ml   Route: Intravitreal, Site: Right Eye   NDC: 50242-060-01, Lot:: [id number] Expiration date: 03/29/2021, Waste: 0.05 mL   Post-op Post injection exam found visual acuity of at least counting fingers. The patient tolerated the procedure well. There were no complications. The patient received written and verbal post procedure care education. Post injection medications were not given.            ASSESSMENT/PLAN:    ICD-10-CM   1. Exudative age-related macular degeneration of right eye with active choroidal neovascularization (HCC)  H35.3211 Intravitreal Injection, Pharmacologic Agent - OD - Right Eye    Bevacizumab (AVASTIN) SOLN 1.25 mg    2. Retinal edema  H35.81 OCT, Retina - OU - Both Eyes    3. Intermediate stage nonexudative age-related macular degeneration of left eye  H35.3122     4. Essential hypertension  I10     5. Hypertensive retinopathy of both eyes  H35.033     6. Pseudophakia, both eyes  Z96.1      1,2. Exudative age related macular degeneration, OD  - s/p IVA OD #1 (05.06.22), #2 (06.03.22), #3 (07.01.22)  - OCT shows persistent/?interval [facility]  overlying [facility]  - BCVA 20/70 from 20/80  - recommend IVA OD #4 today, 07.29.22 - pt wishes to be treated with IVA - RBA of procedure discussed, questions answered - IVA informed consent obtained and signed, 05.06.22 (OD) - see procedure note  - f/u in 4 wks -- DFE/OCT, possible injection  3. Age related macular degeneration, non-exudative, OS  - The incidence, anatomy, and pathology of dry AMD, risk of  progression, and the AREDS and AREDS 2 studies including smoking risks discussed with patient.  - Recommend amsler grid monitoring  - f/u 3 months  4,5. Hypertensive retinopathy OU - discussed importance of tight BP control - monitor  6. Pseudophakia OU  - s/p CE/IOL  - IOL in good position, doing well  - monitor  Ophthalmic Meds Ordered this visit:  Meds ordered this encounter  Medications   Bevacizumab (AVASTIN) SOLN 1.25 mg       Return in about 4 weeks (around 03/04/2021) for 4 wk f/u for exu ARMD OD w/DFE/OCT/likely inj,.  There are no Patient Instructions on file for this visit.  This document serves as a record of services personally performed by Gardiner Sleeper, MD, PhD. It was created on their behalf by Estill Bakes, COT an ophthalmic technician. The creation of this record is the provider's dictation and/or activities during the visit.    Electronically signed by: Estill Bakes, COT 7.29.22 @ 12:44 PM   Gardiner Sleeper, M.D., Ph.D. Diseases & Surgery of the Retina and [place] 02/04/2021   I have reviewed the above documentation for accuracy and completeness, and I agree with the above. Gardiner Sleeper, M.D., Ph.D. 02/04/21 12:44 PM  Abbreviations: M myopia (nearsighted); A astigmatism; H hyperopia (farsighted); P presbyopia; Mrx spectacle prescription;  CTL contact lenses; OD right eye; OS left eye; OU both eyes  XT exotropia; ET esotropia; PEK punctate epithelial keratitis; PEE punctate epithelial erosions; DES dry eye syndrome; MGD meibomian gland dysfunction; ATs artificial tears; PFAT's preservative free artificial tears; [place] nuclear sclerotic cataract; PSC posterior subcapsular cataract; ERM epi-retinal membrane; PVD posterior vitreous detachment; RD retinal detachment; DM diabetes mellitus; DR diabetic retinopathy; NPDR non-proliferative diabetic retinopathy; PDR proliferative diabetic retinopathy; CSME clinically significant  macular edema; DME diabetic macular edema; dbh dot blot hemorrhages; CWS cotton wool spot; POAG primary open angle glaucoma; C/D cup-to-disc ratio; HVF humphrey visual field; GVF goldmann visual field; OCT optical coherence tomography; IOP intraocular pressure; BRVO Branch retinal vein occlusion; CRVO central retinal vein occlusion; CRAO central retinal artery occlusion; BRAO branch retinal artery occlusion; RT retinal tear; SB scleral buckle; PPV pars plana vitrectomy; VH Vitreous hemorrhage; PRP panretinal laser photocoagulation; IVK intravitreal kenalog; VMT vitreomacular traction; MH Macular hole;  NVD neovascularization of the disc; NVE neovascularization elsewhere; AREDS age related eye disease study; ARMD age related macular degeneration; POAG primary open angle glaucoma; EBMD epithelial/anterior basement membrane dystrophy; ACIOL anterior chamber intraocular lens; IOL intraocular lens; PCIOL posterior chamber intraocular lens; Phaco/IOL phacoemulsification with intraocular lens placement; [place] photorefractive keratectomy; LASIK laser assisted in situ keratomileusis; HTN hypertension; DM diabetes mellitus; COPD chronic obstructive pulmonary disease

## 2021-02-04 ENCOUNTER — Encounter (INDEPENDENT_AMBULATORY_CARE_PROVIDER_SITE_OTHER): Payer: Self-pay | Admitting: Ophthalmology

## 2021-02-04 ENCOUNTER — Ambulatory Visit (INDEPENDENT_AMBULATORY_CARE_PROVIDER_SITE_OTHER): Payer: Medicare PPO | Admitting: Ophthalmology

## 2021-02-04 ENCOUNTER — Other Ambulatory Visit: Payer: Self-pay

## 2021-02-04 DIAGNOSIS — H35033 Hypertensive retinopathy, bilateral: Secondary | ICD-10-CM | POA: Diagnosis not present

## 2021-02-04 DIAGNOSIS — H353211 Exudative age-related macular degeneration, right eye, with active choroidal neovascularization: Secondary | ICD-10-CM

## 2021-02-04 DIAGNOSIS — Z961 Presence of intraocular lens: Secondary | ICD-10-CM

## 2021-02-04 DIAGNOSIS — H3581 Retinal edema: Secondary | ICD-10-CM

## 2021-02-04 DIAGNOSIS — H353122 Nonexudative age-related macular degeneration, left eye, intermediate dry stage: Secondary | ICD-10-CM

## 2021-02-04 DIAGNOSIS — I10 Essential (primary) hypertension: Secondary | ICD-10-CM

## 2021-02-04 MED ORDER — BEVACIZUMAB CHEMO INJECTION 1.25MG/0.05ML SYRINGE FOR KALEIDOSCOPE
1.2500 mg | INTRAVITREAL | Status: AC | PRN
  Administered 2021-02-04: 1.25 mg via INTRAVITREAL

## 2021-02-28 NOTE — Progress Notes (Signed)
[facility] Note  03/04/2021     CHIEF COMPLAINT Patient presents for Retina Follow Up   HISTORY OF PRESENT ILLNESS: Dana Sandoval is a 80 y.o. 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  This started 4 weeks ago.  I, the attending physician,  performed the HPI with the patient and updated documentation appropriately.        Comments   Patient here for 4 weeks retina follow up for exu ARMD OD. Patient states vision about the same. No eye pain.       Last edited by Bernarda Caffey, MD on 03/08/2021  4:35 PM.     Patient states vision the same OU.  Referring physician: Elby Beck, FNP [address] [place],  [place] [address]  HISTORICAL INFORMATION:   Selected notes from the MEDICAL RECORD NUMBER Referred by Dr. Zenia Resides for eval of ret heme OD   CURRENT MEDICATIONS: No current outpatient medications on file. (Ophthalmic Drugs)   No current facility-administered medications for this visit. (Ophthalmic Drugs)   Current Outpatient Medications (Other)  Medication Sig   ACCU-CHEK AVIVA PLUS test strip USE AS DIRECTED   aspirin EC 81 MG tablet Take 1 tablet (81 mg total) by mouth every other day.   atorvastatin (LIPITOR) 40 MG tablet TAKE 1 TABLET BY MOUTH ONCE DAILY AT  6PM   Cyanocobalamin 1000 MCG/ML KIT Inject 1,000 mcg into the muscle every 30 (thirty) days.   levothyroxine (SYNTHROID) 88 MCG tablet Take 1 tablet (88 mcg total) by mouth daily.   metFORMIN (GLUCOPHAGE) 500 MG tablet TAKE 1 TABLET BY MOUTH TWICE A DAY WITH MEALS   Multiple Vitamins-Minerals (PRESERVISION AREDS 2 PO) Take 1 tablet by mouth 2 (two) times daily.    Needles & Syringes (1CC TB SYRINGE) MISC 1 Units by Does not apply route as needed.   NITROGLYCERIN PO Take 0.4 mg by mouth every 5 (five) minutes as needed (chest pain).     ramipril (ALTACE) 10 MG capsule Take 1 capsule (10  mg total) by mouth daily.   valACYclovir (VALTREX) 500 MG tablet Take 500 mg by mouth daily.   VITAMIN D PO Take 1,000 Units by mouth daily at 2 PM.   No current facility-administered medications for this visit. (Other)    REVIEW OF SYSTEMS: ROS   Positive for: Gastrointestinal, Musculoskeletal, Endocrine, Cardiovascular, Eyes Negative for: Constitutional, Neurological, Skin, Genitourinary, HENT, Respiratory, Psychiatric, Allergic/Imm, Heme/Lymph Last edited by Theodore Demark, COA on 03/04/2021  7:48 AM.      ALLERGIES Allergies  Allergen Reactions   Codeine Itching    After 3 days   Dilaudid [Hydromorphone Hcl] Itching    After 3 days    PAST MEDICAL HISTORY Past Medical History:  Diagnosis Date   Aortic stenosis    Arthritis    B12 deficiency    Diabetes mellitus    type 2   Heart murmur    aortic stenosis   Hyperlipidemia    Hypothyroidism    IBS (irritable bowel syndrome)    NSVD (normal spontaneous vaginal delivery)    X 4   Pain    INTRASCAPULAR PAIN   SAB (spontaneous abortion)    X 2   Thyroid disease    HYPOTHYROIDISM   Past Surgical History:  Procedure Laterality Date   ABDOMINAL HYSTERECTOMY  1977   TAH  (FIBROIDS)   CATARACT EXTRACTION Bilateral    DILATION AND CURETTAGE OF UTERUS     DOPPLER ECHOCARDIOGRAPHY  2009   HAD AN ECHOCARDIOGRAM THAT SHOWED MODERATE MITRAL  ANNULAR CALCIFICATION AND A NORMAL EJECTION FRACTION OF 55%   EYE SURGERY Bilateral    Cat Sx   OTHER SURGICAL HISTORY     HYSTERECTOMY   SHOULDER SURGERY     RIGHT SHOULDER SURGERY   THYROIDECTOMY     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   RIGHT   TOTAL KNEE ARTHROPLASTY Left 09/10/2018   Procedure: TOTAL KNEE ARTHROPLASTY;  Surgeon: Melrose Nakayama, MD;  Location: [facility];  Service: Orthopedics;  Laterality: Left;    FAMILY HISTORY Family History  Problem Relation Age of Onset   Osteoporosis Mother    Heart disease Mother    Macular degeneration Mother    Heart disease  Father    Cancer Father        BRAIN TUMOR   Diabetes Maternal Aunt    Diabetes Maternal Uncle    Cancer Brother        BRAIN TUMOR   Colon cancer Neg Hx    Breast cancer Neg Hx     SOCIAL HISTORY Social History   Tobacco Use   Smoking status: Never   Smokeless tobacco: Never  Vaping Use   Vaping Use: Never used  Substance Use Topics   Alcohol use: No    Alcohol/week: 0.0 standard drinks   Drug use: No         OPHTHALMIC EXAM:  Base Eye Exam     Visual Acuity (Snellen - Linear)       Right Left   Dist [place] 20/50 -1 20/30 -2   Dist ph [place] NI NI         Tonometry (Tonopen, 7:46 AM)       Right Left   Pressure 14 17         Pupils       Dark Light Shape React APD   Right 4 3 Round Brisk None   Left 4 3 Round Brisk None         Visual Fields (Counting fingers)       Left Right    Full Full         Extraocular Movement       Right Left    Full Full         Neuro/Psych     Oriented x3: Yes   Mood/Affect: Normal         Dilation     Both eyes: 1.0% Mydriacyl, 2.5% Phenylephrine @ 7:45 AM           Slit Lamp and Fundus Exam     Slit Lamp Exam       Right Left   Lids/Lashes Dermatochalasis - upper lid Dermatochalasis - upper lid   Conjunctiva/Sclera White and quiet White and quiet   Cornea 1+ Punctate epithelial erosions, fine endo pigment 1-2+ fine Punctate epithelial erosions, fine endo pigment   Anterior Chamber Deep and quiet Deep and quiet   Iris Round and dilated Round and dilated   Lens Toric PC IOL with marks at 1200 and 0600, 1+ Posterior capsular opacification PC IOL in good position, 1-2+ Posterior capsular opacification   Vitreous Vitreous syneresis, trace pigment, Posterior vitreous detachment, vitreous condensations Vitreous syneresis, Posterior vitreous detachment, vitreous condensations         Fundus Exam  Right Left   Disc mild Pallor, Sharp rim, +cupping, PPA mild Pallor, Sharp rim, +cupping, PPA    C/D Ratio 0.7 0.7   Macula Blunted foveal reflex, central CNV with subretinal heme along inferior aspect --stabley resolved---no with pigment ring, RPE mottling, clumping and atrophy, Drusen, persistent shallow SRF overlying CNV--improved Flat, Blunted foveal reflex, Drusen, RPE mottling, clumping and atrophy, No heme or edema   Vessels attenuated, Tortuous, AV crossing changes attenuated, Tortuous, AV crossing changes   Periphery Attached, reticular degeneration, paving stone degeneration IT quad, No heme  Attached, reticular degeneration, mild inferior paving stone degeneration            IMAGING AND PROCEDURES  Imaging and Procedures for 03/04/2021  OCT, Retina - OU - Both Eyes       Right Eye Quality was good. Central Foveal Thickness: 369. Progression has improved. Findings include abnormal foveal contour, intraretinal fluid, subretinal fluid, subretinal hyper-reflective material, pigment epithelial detachment, retinal drusen , outer retinal atrophy (Persistent / slight interval improvement in SRF overlying PED).   Left Eye Quality was good. Central Foveal Thickness: 225. Progression has been stable. Findings include normal foveal contour, no IRF, no SRF, outer retinal atrophy, retinal drusen .   Notes *Images captured and stored on drive  Diagnosis / Impression:  OD: ex ARMD - Persistent / slight interval improvement in SRF overlying PED OS: non-exu ARMD   Clinical management:  See below  Abbreviations: NFP - Normal foveal profile. CME - cystoid macular edema. PED - pigment epithelial detachment. IRF - intraretinal fluid. SRF - subretinal fluid. EZ - ellipsoid zone. ERM - epiretinal membrane. ORA - outer retinal atrophy. ORT - outer retinal tubulation. SRHM - subretinal hyper-reflective material. IRHM - intraretinal hyper-reflective material      Intravitreal Injection, Pharmacologic Agent - OD - Right Eye       Time Out 03/04/2021. 8:06 AM. Confirmed correct patient,  procedure, site, and patient consented.   Anesthesia Topical anesthesia was used. Anesthetic medications included Lidocaine 2%, Proparacaine 0.5%.   Procedure Preparation included 5% betadine to ocular surface, eyelid speculum. A (32g) needle was used.   Injection: 1.25 mg Bevacizumab 1.30m/0.05ml   Route: Intravitreal, Site: Right Eye   NDC: 50242-060-01, Lot:: [id number] Expiration date: 03/29/2021, Waste: 0.05 mL   Post-op Post injection exam found visual acuity of at least counting fingers. The patient tolerated the procedure well. There were no complications. The patient received written and verbal post procedure care education. Post injection medications were not given.             ASSESSMENT/PLAN:    ICD-10-CM   1. Exudative age-related macular degeneration of right eye with active choroidal neovascularization (HCC)  H35.3211 Intravitreal Injection, Pharmacologic Agent - OD - Right Eye    Bevacizumab (AVASTIN) SOLN 1.25 mg    2. Retinal edema  H35.81 OCT, Retina - OU - Both Eyes    3. Intermediate stage nonexudative age-related macular degeneration of left eye  H35.3122     4. Essential hypertension  I10     5. Hypertensive retinopathy of both eyes  H35.033     6. Pseudophakia, both eyes  Z96.1       1,2. Exudative age related macular degeneration, OD  - s/p IVA OD #1 (05.06.22), #2 (06.03.22), #3 (07.01.22), #4 (07.29.22)  - OCT shows persistent/slight interval improvement in SRF overlying PED  - BCVA improved to 20/50 from 20/70+2  - recommend IVA OD #5 today, 08.26.22 - pt wishes to be  treated with IVA - RBA of procedure discussed, questions answered - IVA informed consent obtained and signed, 05.06.22 (OD) - see procedure note  - f/u in 4 wks -- DFE/OCT, possible injection  3. Age related macular degeneration, non-exudative, OS  - The incidence, anatomy, and pathology of dry AMD, risk of progression, and the AREDS and AREDS 2 studies including smoking risks  discussed with patient.  - Recommend amsler grid monitoring   - f/u 3 months  4,5. Hypertensive retinopathy OU - discussed importance of tight BP control - monitor   6. Pseudophakia OU  - s/p CE/IOL  - IOL in good position, doing well  - monitor   Ophthalmic Meds Ordered this visit:  Meds ordered this encounter  Medications   Bevacizumab (AVASTIN) SOLN 1.25 mg      Return in about 4 weeks (around 04/01/2021) for DFE, OCT.  There are no Patient Instructions on file for this visit.  This document serves as a record of services personally performed by Gardiner Sleeper, MD, PhD. It was created on their behalf by Leonie Douglas, an ophthalmic technician. The creation of this record is the provider's dictation and/or activities during the visit.    Electronically signed by: Leonie Douglas COA, 03/08/21  4:39 PM  This document serves as a record of services personally performed by Gardiner Sleeper, MD, PhD. It was created on their behalf by Roselee Nova, COMT. The creation of this record is the provider's dictation and/or activities during the visit.  Electronically signed by: Roselee Nova, COMT 03/08/21 4:39 PM  Gardiner Sleeper, M.D., Ph.D. Diseases & Surgery of the Retina and [place] 03/04/2021  I have reviewed the above documentation for accuracy and completeness, and I agree with the above. Gardiner Sleeper, M.D., Ph.D. 03/08/21 4:39 PM   Abbreviations: M myopia (nearsighted); A astigmatism; H hyperopia (farsighted); P presbyopia; Mrx spectacle prescription;  CTL contact lenses; OD right eye; OS left eye; OU both eyes  XT exotropia; ET esotropia; PEK punctate epithelial keratitis; PEE punctate epithelial erosions; DES dry eye syndrome; MGD meibomian gland dysfunction; ATs artificial tears; PFAT's preservative free artificial tears; [place] nuclear sclerotic cataract; PSC posterior subcapsular cataract; ERM epi-retinal membrane; PVD posterior vitreous  detachment; RD retinal detachment; DM diabetes mellitus; DR diabetic retinopathy; NPDR non-proliferative diabetic retinopathy; PDR proliferative diabetic retinopathy; CSME clinically significant macular edema; DME diabetic macular edema; dbh dot blot hemorrhages; CWS cotton wool spot; POAG primary open angle glaucoma; C/D cup-to-disc ratio; HVF humphrey visual field; GVF goldmann visual field; OCT optical coherence tomography; IOP intraocular pressure; BRVO Branch retinal vein occlusion; CRVO central retinal vein occlusion; CRAO central retinal artery occlusion; BRAO branch retinal artery occlusion; RT retinal tear; SB scleral buckle; PPV pars plana vitrectomy; VH Vitreous hemorrhage; PRP panretinal laser photocoagulation; IVK intravitreal kenalog; VMT vitreomacular traction; MH Macular hole;  NVD neovascularization of the disc; NVE neovascularization elsewhere; AREDS age related eye disease study; ARMD age related macular degeneration; POAG primary open angle glaucoma; EBMD epithelial/anterior basement membrane dystrophy; ACIOL anterior chamber intraocular lens; IOL intraocular lens; PCIOL posterior chamber intraocular lens; Phaco/IOL phacoemulsification with intraocular lens placement; [place] photorefractive keratectomy; LASIK laser assisted in situ keratomileusis; HTN hypertension; DM diabetes mellitus; COPD chronic obstructive pulmonary disease

## 2021-03-04 ENCOUNTER — Encounter (INDEPENDENT_AMBULATORY_CARE_PROVIDER_SITE_OTHER): Payer: Self-pay | Admitting: Ophthalmology

## 2021-03-04 ENCOUNTER — Ambulatory Visit (INDEPENDENT_AMBULATORY_CARE_PROVIDER_SITE_OTHER): Payer: Medicare PPO | Admitting: Ophthalmology

## 2021-03-04 ENCOUNTER — Other Ambulatory Visit: Payer: Self-pay

## 2021-03-04 DIAGNOSIS — H353122 Nonexudative age-related macular degeneration, left eye, intermediate dry stage: Secondary | ICD-10-CM | POA: Diagnosis not present

## 2021-03-04 DIAGNOSIS — I10 Essential (primary) hypertension: Secondary | ICD-10-CM | POA: Diagnosis not present

## 2021-03-04 DIAGNOSIS — Z961 Presence of intraocular lens: Secondary | ICD-10-CM | POA: Diagnosis not present

## 2021-03-04 DIAGNOSIS — H35033 Hypertensive retinopathy, bilateral: Secondary | ICD-10-CM

## 2021-03-04 DIAGNOSIS — H3581 Retinal edema: Secondary | ICD-10-CM | POA: Diagnosis not present

## 2021-03-04 DIAGNOSIS — H353211 Exudative age-related macular degeneration, right eye, with active choroidal neovascularization: Secondary | ICD-10-CM

## 2021-03-08 ENCOUNTER — Encounter (INDEPENDENT_AMBULATORY_CARE_PROVIDER_SITE_OTHER): Payer: Self-pay | Admitting: Ophthalmology

## 2021-03-08 MED ORDER — BEVACIZUMAB CHEMO INJECTION 1.25MG/0.05ML SYRINGE FOR KALEIDOSCOPE
1.2500 mg | INTRAVITREAL | Status: AC | PRN
  Administered 2021-03-04: 1.25 mg via INTRAVITREAL

## 2021-03-22 ENCOUNTER — Telehealth: Payer: Self-pay | Admitting: Family Medicine

## 2021-03-22 NOTE — Telephone Encounter (Signed)
Pt called stating that she needs her levothyroxine refilled by Wednesday

## 2021-03-23 MED ORDER — LEVOTHYROXINE SODIUM 88 MCG PO TABS: 88.0000 ug | ORAL_TABLET | Freq: Every day | ORAL | 3 refills | Status: DC

## 2021-03-23 NOTE — Telephone Encounter (Signed)
Rx sent. Tried to call patient to let know but could not leave message on home phone. Called mobile number and left VM.

## 2021-03-23 NOTE — Addendum Note (Signed)
Addended by: Wendie Simmer B on: 03/23/2021 02:09 PM   Modules accepted: Orders

## 2021-03-24 ENCOUNTER — Other Ambulatory Visit: Payer: Self-pay | Admitting: Family Medicine

## 2021-03-28 NOTE — Progress Notes (Addendum)
[facility] Note  04/01/2021     CHIEF COMPLAINT Patient presents for Retina Follow Up   HISTORY OF PRESENT ILLNESS: Dana Sandoval is a 80 y.o. 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  This started 4 weeks ago.  I, the attending physician,  performed the HPI with the patient and updated documentation appropriately.        Comments   Patient here for 4 weeks for retina follow up for exu ARMD OD. Patient states vision still there. Some parts may be a little improvement. Depends on what color looks at white shows up. No eye pain.       Last edited by Bernarda Caffey, MD on 04/01/2021 11:22 AM.     Patient states vision seems stable  Referring physician: Elby Beck, FNP [address] [place],  [place] [address]  HISTORICAL INFORMATION:   Selected notes from the MEDICAL RECORD NUMBER Referred by Dr. Zenia Resides for eval of ret heme OD   CURRENT MEDICATIONS: No current outpatient medications on file. (Ophthalmic Drugs)   No current facility-administered medications for this visit. (Ophthalmic Drugs)   Current Outpatient Medications (Other)  Medication Sig   ACCU-CHEK AVIVA PLUS test strip USE AS DIRECTED   aspirin EC 81 MG tablet Take 1 tablet (81 mg total) by mouth every other day.   atorvastatin (LIPITOR) 40 MG tablet TAKE 1 TABLET BY MOUTH ONCE DAILY AT  6PM   cyanocobalamin (,VITAMIN B-12,) 1000 MCG/ML injection INJECT 1,000 MCG (1 MLS) INTO THE MUSCLEONCE EVERY 30 DAYS   levothyroxine (SYNTHROID) 88 MCG tablet Take 1 tablet (88 mcg total) by mouth daily.   metFORMIN (GLUCOPHAGE) 500 MG tablet TAKE 1 TABLET BY MOUTH TWICE A DAY WITH MEALS   Multiple Vitamins-Minerals (PRESERVISION AREDS 2 PO) Take 1 tablet by mouth 2 (two) times daily.    Needles & Syringes (1CC TB SYRINGE) MISC 1 Units by Does not apply route as needed.   NITROGLYCERIN PO Take 0.4 mg  by mouth every 5 (five) minutes as needed (chest pain).     ramipril (ALTACE) 10 MG capsule Take 1 capsule (10 mg total) by mouth daily.   valACYclovir (VALTREX) 500 MG tablet Take 500 mg by mouth daily.   VITAMIN D PO Take 1,000 Units by mouth daily at 2 PM.   No current facility-administered medications for this visit. (Other)    REVIEW OF SYSTEMS: ROS   Positive for: Gastrointestinal, Musculoskeletal, Endocrine, Cardiovascular, Eyes Negative for: Constitutional, Neurological, Skin, Genitourinary, HENT, Respiratory, Psychiatric, Allergic/Imm, Heme/Lymph Last edited by Theodore Demark, COA on 04/01/2021  7:54 AM.       ALLERGIES Allergies  Allergen Reactions   Codeine Itching    After 3 days   Dilaudid [Hydromorphone Hcl] Itching    After 3 days    PAST MEDICAL HISTORY Past Medical History:  Diagnosis Date   Aortic stenosis    Arthritis    B12 deficiency    Diabetes mellitus    type 2   Heart murmur    aortic stenosis   Hyperlipidemia    Hypothyroidism    IBS (irritable bowel syndrome)    NSVD (normal spontaneous vaginal delivery)    X 4   Pain    INTRASCAPULAR PAIN   SAB (spontaneous abortion)    X 2   Thyroid  disease    HYPOTHYROIDISM   Past Surgical History:  Procedure Laterality Date   ABDOMINAL HYSTERECTOMY  1977   TAH  (FIBROIDS)   CATARACT EXTRACTION Bilateral    DILATION AND CURETTAGE OF UTERUS     DOPPLER ECHOCARDIOGRAPHY  2009   HAD AN ECHOCARDIOGRAM THAT SHOWED MODERATE MITRAL  ANNULAR CALCIFICATION AND A NORMAL EJECTION FRACTION OF 55%   EYE SURGERY Bilateral    Cat Sx   OTHER SURGICAL HISTORY     HYSTERECTOMY   SHOULDER SURGERY     RIGHT SHOULDER SURGERY   THYROIDECTOMY     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   RIGHT   TOTAL KNEE ARTHROPLASTY Left 09/10/2018   Procedure: TOTAL KNEE ARTHROPLASTY;  Surgeon: Melrose Nakayama, MD;  Location: [facility];  Service: Orthopedics;  Laterality: Left;    FAMILY HISTORY Family History  Problem  Relation Age of Onset   Osteoporosis Mother    Heart disease Mother    Macular degeneration Mother    Heart disease Father    Cancer Father        BRAIN TUMOR   Diabetes Maternal Aunt    Diabetes Maternal Uncle    Cancer Brother        BRAIN TUMOR   Colon cancer Neg Hx    Breast cancer Neg Hx     SOCIAL HISTORY Social History   Tobacco Use   Smoking status: Never   Smokeless tobacco: Never  Vaping Use   Vaping Use: Never used  Substance Use Topics   Alcohol use: No    Alcohol/week: 0.0 standard drinks   Drug use: No         OPHTHALMIC EXAM:  Base Eye Exam     Visual Acuity (Snellen - Linear)       Right Left   Dist [place] 20/40 -2 20/40 +1   Dist ph [place] 20/30 -2 NI         Tonometry (Tonopen, 7:50 AM)       Right Left   Pressure 14 17         Pupils       Dark Light Shape React APD   Right 4 3 Round Brisk None   Left 4 3 Round Brisk None         Visual Fields (Counting fingers)       Left Right    Full Full         Extraocular Movement       Right Left    Full Full         Neuro/Psych     Oriented x3: Yes   Mood/Affect: Normal         Dilation     Both eyes: 1.0% Mydriacyl, 2.5% Phenylephrine @ 7:50 AM           Slit Lamp and Fundus Exam     Slit Lamp Exam       Right Left   Lids/Lashes Dermatochalasis - upper lid Dermatochalasis - upper lid   Conjunctiva/Sclera White and quiet White and quiet   Cornea 1+ Punctate epithelial erosions, fine endo pigment 1-2+ fine Punctate epithelial erosions, fine endo pigment   Anterior Chamber Deep and quiet Deep and quiet   Iris Round and dilated Round and dilated   Lens Toric PC IOL with marks at 1200 and 0600, 1+ Posterior capsular opacification PC IOL in good position, 1-2+ Posterior capsular opacification   Vitreous Vitreous syneresis, trace pigment, Posterior vitreous detachment, vitreous  condensations Vitreous syneresis, Posterior vitreous detachment, vitreous condensations          Fundus Exam       Right Left   Disc mild Pallor, Sharp rim, +cupping, PPA mild Pallor, Sharp rim, +cupping, PPA   C/D Ratio 0.7 0.7   Macula Blunted foveal reflex, central CNV with subretinal heme along inferior aspect --stably resolved---now with pigment ring, RPE mottling, clumping and atrophy, Drusen, persistent shallow SRF overlying CNV--improved Flat, Blunted foveal reflex, Drusen, RPE mottling, clumping and atrophy, No heme or edema   Vessels attenuated, Tortuous, AV crossing changes attenuated, Tortuous, AV crossing changes   Periphery Attached, reticular degeneration, paving stone degeneration IT quad, No heme  Attached, reticular degeneration, mild inferior paving stone degeneration           IMAGING AND PROCEDURES  Imaging and Procedures for 04/01/2021  OCT, Retina - OU - Both Eyes       Right Eye Quality was good. Central Foveal Thickness: 361. Progression has been stable. Findings include abnormal foveal contour, intraretinal fluid, subretinal fluid, subretinal hyper-reflective material, pigment epithelial detachment, retinal drusen , outer retinal atrophy (Persistent / slight interval improvement in SRF overlying PED).   Left Eye Quality was good. Central Foveal Thickness: 224. Progression has been stable. Findings include normal foveal contour, no IRF, no SRF, outer retinal atrophy, retinal drusen .   Notes *Images captured and stored on drive  Diagnosis / Impression:  OD: ex ARMD - Persistent / slight interval improvement in SRF overlying PED OS: non-exu ARMD   Clinical management:  See below  Abbreviations: NFP - Normal foveal profile. CME - cystoid macular edema. PED - pigment epithelial detachment. IRF - intraretinal fluid. SRF - subretinal fluid. EZ - ellipsoid zone. ERM - epiretinal membrane. ORA - outer retinal atrophy. ORT - outer retinal tubulation. SRHM - subretinal hyper-reflective material. IRHM - intraretinal hyper-reflective material       Intravitreal Injection, Pharmacologic Agent - OD - Right Eye       Time Out 04/01/2021. 8:16 AM. Confirmed correct patient, procedure, site, and patient consented.   Anesthesia Topical anesthesia was used. Anesthetic medications included Lidocaine 2%, Proparacaine 0.5%.   Procedure Preparation included 5% betadine to ocular surface, eyelid speculum. A (32g) needle was used.   Injection: 1.25 mg Bevacizumab 1.26m/0.05ml   Route: Intravitreal, Site: Right Eye   NDC:: [id number] Lot: [id number]' , Expiration date: 05/25/2021, Waste: 0 mL   Post-op Post injection exam found visual acuity of at least counting fingers. The patient tolerated the procedure well. There were no complications. The patient received written and verbal post procedure care education. Post injection medications were not given.            ASSESSMENT/PLAN:    ICD-10-CM   1. Exudative age-related macular degeneration of right eye with active choroidal neovascularization (HCC)  H35.3211 Intravitreal Injection, Pharmacologic Agent - OD - Right Eye    2. Retinal edema  H35.81 OCT, Retina - OU - Both Eyes    3. Intermediate stage nonexudative age-related macular degeneration of left eye  H35.3122     4. Essential hypertension  I10     5. Hypertensive retinopathy of both eyes  H35.033     6. Pseudophakia, both eyes  Z96.1      1,2. Exudative age related macular degeneration, OD  - s/p IVA OD #1 (05.06.22), #2 (06.03.22), #3 (07.01.22), #4 (07.29.22), #5 (08.26.22)  - OCT shows persistent/slight interval improvement in SRF overlying PED  - BCVA improved to  20/50 from 20/70+2  - recommend IVA OD #6 today, 09.23.22 - pt wishes to be treated with IVA - RBA of procedure discussed, questions answered - IVA informed consent obtained and signed, 05.06.22 (OD) - see procedure note  - f/u in 4 wks -- DFE/OCT, possible injection  3. Age related macular degeneration, non-exudative, OS  - The incidence, anatomy,  and pathology of dry AMD, risk of progression, and the AREDS and AREDS 2 studies including smoking risks discussed with patient.  - Recommend amsler grid monitoring   - f/u 3 months   4,5. Hypertensive retinopathy OU - discussed importance of tight BP control - monitor    6. Pseudophakia OU  - s/p CE/IOL  - IOL in good position, doing well  - monitor   Ophthalmic Meds Ordered this visit:  No orders of the defined types were placed in this encounter.     Return in about 4 weeks (around 04/29/2021) for exu ARMD OD, DFE, OCT.  There are no Patient Instructions on file for this visit.  This document serves as a record of services personally performed by Gardiner Sleeper, MD, PhD. It was created on their behalf by Leonie Douglas, an ophthalmic technician. The creation of this record is the provider's dictation and/or activities during the visit.    Electronically signed by: Leonie Douglas COA, 04/01/21  11:26 AM  This document serves as a record of services personally performed by Gardiner Sleeper, MD, PhD. It was created on their behalf by San Jetty. Owens Shark, OA an ophthalmic technician. The creation of this record is the provider's dictation and/or activities during the visit.    Electronically signed by: San Jetty. Owens Shark, [place] 09.23.2022 11:26 AM  Gardiner Sleeper, M.D., Ph.D. Diseases & Surgery of the Retina and [place] 04/01/2021  I have reviewed the above documentation for accuracy and completeness, and I agree with the above. Gardiner Sleeper, M.D., Ph.D. 04/01/21 11:26 AM   Abbreviations: M myopia (nearsighted); A astigmatism; H hyperopia (farsighted); P presbyopia; Mrx spectacle prescription;  CTL contact lenses; OD right eye; OS left eye; OU both eyes  XT exotropia; ET esotropia; PEK punctate epithelial keratitis; PEE punctate epithelial erosions; DES dry eye syndrome; MGD meibomian gland dysfunction; ATs artificial tears; PFAT's preservative free  artificial tears; [place] nuclear sclerotic cataract; PSC posterior subcapsular cataract; ERM epi-retinal membrane; PVD posterior vitreous detachment; RD retinal detachment; DM diabetes mellitus; DR diabetic retinopathy; NPDR non-proliferative diabetic retinopathy; PDR proliferative diabetic retinopathy; CSME clinically significant macular edema; DME diabetic macular edema; dbh dot blot hemorrhages; CWS cotton wool spot; POAG primary open angle glaucoma; C/D cup-to-disc ratio; HVF humphrey visual field; GVF goldmann visual field; OCT optical coherence tomography; IOP intraocular pressure; BRVO Branch retinal vein occlusion; CRVO central retinal vein occlusion; CRAO central retinal artery occlusion; BRAO branch retinal artery occlusion; RT retinal tear; SB scleral buckle; PPV pars plana vitrectomy; VH Vitreous hemorrhage; PRP panretinal laser photocoagulation; IVK intravitreal kenalog; VMT vitreomacular traction; MH Macular hole;  NVD neovascularization of the disc; NVE neovascularization elsewhere; AREDS age related eye disease study; ARMD age related macular degeneration; POAG primary open angle glaucoma; EBMD epithelial/anterior basement membrane dystrophy; ACIOL anterior chamber intraocular lens; IOL intraocular lens; PCIOL posterior chamber intraocular lens; Phaco/IOL phacoemulsification with intraocular lens placement; [place] photorefractive keratectomy; LASIK laser assisted in situ keratomileusis; HTN hypertension; DM diabetes mellitus; COPD chronic obstructive pulmonary disease

## 2021-04-01 ENCOUNTER — Ambulatory Visit (INDEPENDENT_AMBULATORY_CARE_PROVIDER_SITE_OTHER): Payer: Medicare PPO | Admitting: Ophthalmology

## 2021-04-01 ENCOUNTER — Encounter (INDEPENDENT_AMBULATORY_CARE_PROVIDER_SITE_OTHER): Payer: Self-pay | Admitting: Ophthalmology

## 2021-04-01 ENCOUNTER — Other Ambulatory Visit: Payer: Self-pay

## 2021-04-01 DIAGNOSIS — H35033 Hypertensive retinopathy, bilateral: Secondary | ICD-10-CM | POA: Diagnosis not present

## 2021-04-01 DIAGNOSIS — H353122 Nonexudative age-related macular degeneration, left eye, intermediate dry stage: Secondary | ICD-10-CM

## 2021-04-01 DIAGNOSIS — H353211 Exudative age-related macular degeneration, right eye, with active choroidal neovascularization: Secondary | ICD-10-CM

## 2021-04-01 DIAGNOSIS — Z961 Presence of intraocular lens: Secondary | ICD-10-CM

## 2021-04-01 DIAGNOSIS — H3581 Retinal edema: Secondary | ICD-10-CM

## 2021-04-01 DIAGNOSIS — I10 Essential (primary) hypertension: Secondary | ICD-10-CM

## 2021-04-01 MED ORDER — BEVACIZUMAB CHEMO INJECTION 1.25MG/0.05ML SYRINGE FOR KALEIDOSCOPE
1.2500 mg | INTRAVITREAL | Status: AC | PRN
  Administered 2021-04-01: 1.25 mg via INTRAVITREAL

## 2021-04-07 ENCOUNTER — Telehealth: Payer: Self-pay | Admitting: Family Medicine

## 2021-04-07 NOTE — Telephone Encounter (Signed)
Pt called stating that we previously sent a refill to the incorrect pharmacy. She states it has been fixed but she wants to make sure it doesn't happen again. Please remove [facility] [address] From pt's list of pharmacies. She only wants prescriptions sent to [facility].

## 2021-04-07 NOTE — Telephone Encounter (Signed)
Pharmacy list has been updated in EMR.

## 2021-04-08 ENCOUNTER — Ambulatory Visit: Payer: Medicare PPO | Admitting: Cardiology

## 2021-04-08 ENCOUNTER — Other Ambulatory Visit: Payer: Self-pay

## 2021-04-08 ENCOUNTER — Encounter: Payer: Self-pay | Admitting: Cardiology

## 2021-04-08 VITALS — BP 123/74 | HR 81 | Temp 98.5°F | Resp 16 | Ht 62.0 in | Wt 179.4 lb

## 2021-04-08 DIAGNOSIS — I10 Essential (primary) hypertension: Secondary | ICD-10-CM | POA: Diagnosis not present

## 2021-04-08 DIAGNOSIS — I350 Nonrheumatic aortic (valve) stenosis: Secondary | ICD-10-CM

## 2021-04-08 DIAGNOSIS — I351 Nonrheumatic aortic (valve) insufficiency: Secondary | ICD-10-CM | POA: Diagnosis not present

## 2021-04-08 NOTE — Progress Notes (Signed)
Primary Physician/Referring:  Joaquim Nam, MD  Patient ID: Dana Sandoval, female    DOB: January 07, 1941, 80 y.o.   MRN: [id number]  Chief Complaint  Patient presents with   Aortic Stenosis   Hypertension   Hyperlipidemia   Follow-up    1 years   HPI:    Dana Sandoval  is a 80 y.o. Caucasian female with history of hyperlipidemia, controlled diabetes, HTN, hypothyroidism, and mild to moderate aortic stenosis and mild to moderate AI. She had a normal stress test in 2011 with no evidence of ischemia and LVEF of 83%.  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     She presents for annual visit.  She is presently doing well and denies any chest pain, shortness of breath, PND or orthopnea.   Past Medical History:  Diagnosis Date   Aortic stenosis    Arthritis    B12 deficiency    Diabetes mellitus    type 2   Heart murmur    aortic stenosis   Hyperlipidemia    Hypothyroidism    IBS (irritable bowel syndrome)    NSVD (normal spontaneous vaginal delivery)    X 4   Pain    INTRASCAPULAR PAIN   SAB (spontaneous abortion)    X 2   Thyroid disease    HYPOTHYROIDISM   Past Surgical History:  Procedure Laterality Date   ABDOMINAL HYSTERECTOMY  1977   TAH  (FIBROIDS)   CATARACT EXTRACTION Bilateral    DILATION AND CURETTAGE OF UTERUS     DOPPLER ECHOCARDIOGRAPHY  2009   HAD AN ECHOCARDIOGRAM THAT SHOWED MODERATE MITRAL  ANNULAR CALCIFICATION AND A NORMAL EJECTION FRACTION OF 55%   EYE SURGERY Bilateral    Cat Sx   OTHER SURGICAL HISTORY     HYSTERECTOMY   SHOULDER SURGERY     RIGHT SHOULDER SURGERY   THYROIDECTOMY     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   RIGHT   TOTAL KNEE ARTHROPLASTY Left 09/10/2018   Procedure: TOTAL KNEE ARTHROPLASTY;  Surgeon: Marcene Corning, MD;  Location: [facility];  Service: Orthopedics;  Laterality: Left;   Social History   Tobacco Use   Smoking status: Never    Smokeless tobacco: Never  Substance Use Topics   Alcohol use: No    Alcohol/week: 0.0 standard drinks   Marital Status: Married   ROS  Review of Systems  Cardiovascular:  Negative for chest pain, dyspnea on exertion and leg swelling.  Musculoskeletal:  Positive for arthritis and joint pain.  Gastrointestinal:  Negative for melena.  Objective   Vitals with BMI 04/08/2021 11/18/2020 07/20/2020  Height 5\' 2"  - 5\' 2"   Weight 179 lbs 6 oz 176 lbs 5 oz 175 lbs  BMI 32.8 - 32  Systolic 123 124  Diastolic 74 60 50  Pulse 81 87 92    Blood pressure 123/74, pulse 81, temperature 98.5 F (36.9 C), temperature source Temporal, resp. rate 16, height 5\' 2"  (1.575 m), weight 179 lb 6.4 oz (81.4 kg), SpO2 98 %. Body mass index is 32.81 kg/m.   Physical Exam Constitutional:      General: She is not in acute distress.    Appearance: She is well-developed.  Neck:     Thyroid: No thyromegaly.     Vascular: Carotid bruit (bilateral) present. No JVD.  Cardiovascular:     Rate and Rhythm: Normal rate and  regular rhythm.     Pulses: Normal pulses and intact distal pulses.          Carotid pulses are  on the right side with bruit and  on the left side with bruit.    Heart sounds: Murmur heard.  Harsh midsystolic murmur is present with a grade of 2/6 at the upper right sternal border radiating to the neck.  High-pitched blowing decrescendo early diastolic murmur is present with a grade of 2/4 at the upper right sternal border radiating to the apex.    No gallop.  Pulmonary:     Effort: Pulmonary effort is normal.     Breath sounds: Normal breath sounds.  Abdominal:     General: Bowel sounds are normal.     Palpations: Abdomen is soft.  Musculoskeletal:     Right lower leg: No edema.     Left lower leg: No edema.   Radiology: No results found.  Laboratory examination:   No results for input(s): NA, K, CL, CO2, GLUCOSE, BUN, CREATININE, CALCIUM, GFRNONAA, GFRAA in the last 8760  hours.  CMP Latest Ref Rng & Units 03/08/2020 01/23/2019 09/02/2018  Glucose 70 - 99 mg/dL 275(T) 700(F) 749(S)  BUN 6 - 23 mg/dL 49(Q) 75(F) 16(B)  Creatinine 0.40 - 1.20 mg/dL 8.46 6.59 9.35  Sodium 135 - 145 mEq/L 140 140 139  Potassium 3.5 - 5.1 mEq/L 4.3 4.2 4.0  Chloride 96 - 112 mEq/L 103 103 105  CO2 19 - 32 mEq/L 26 29 27   Calcium 8.4 - 10.5 mg/dL 9.3 8.6 )  Total Protein 6.0 - 8.3 g/dL 6.4 7.0(V) -  Total Bilirubin 0.2 - 1.2 mg/dL 0.5 0.6 -  Alkaline Phos 39 - 117 U/L 79 72 -  AST 0 - 37 U/L 17 12 -  ALT 0 - 35 U/L 14 9 -   CBC Latest Ref Rng & Units 03/08/2020 01/23/2019 09/02/2018  WBC 4.0 - 10.5 K/uL 12.2(H) 7.5 6.9  Hemoglobin 12.0 - 15.0 g/dL 09/04/2018 11.6(L) 11.7(L)  Hematocrit 36.0 - 46.0 % 38.1 34.6(L) 36.7  Platelets 150.0 - 400.0 K/uL 458.0(H) 348.0 368   Lipid Panel     Component Value Date/Time   CHOL 125 03/08/2020 0819   TRIG 127.0 03/08/2020 0819   HDL 32.00 (L) 03/08/2020 0819   CHOLHDL 4 03/08/2020 0819   VLDL 25.4 03/08/2020 0819   LDLCALC 68 03/08/2020 0819     HEMOGLOBIN A1C Lab Results  Component Value Date   HGBA1C 6.7 (H) 11/18/2020   MPG 120 (H) 07/13/2010   TSH Recent Labs    07/20/20 0920  TSH 0.49   Medications   Allergies  Allergen Reactions   Codeine Itching    After 3 days   Dilaudid [Hydromorphone Hcl] Itching    After 3 days    Current Outpatient Medications on File Prior to Visit  Medication Sig Dispense Refill   ACCU-CHEK AVIVA PLUS test strip USE AS DIRECTED 100 each 2   aspirin EC 81 MG tablet Take 1 tablet (81 mg total) by mouth every other day.     atorvastatin (LIPITOR) 40 MG tablet TAKE 1 TABLET BY MOUTH ONCE DAILY AT  6PM 90 tablet 3   cyanocobalamin (,VITAMIN B-12,) 1000 MCG/ML injection INJECT 1,000 MCG (1 MLS) INTO THE MUSCLEONCE EVERY 30 DAYS 1 mL 2   levothyroxine (SYNTHROID) 88 MCG tablet Take 1 tablet (88 mcg total) by mouth daily. 90 tablet 3   metFORMIN (GLUCOPHAGE) 500 MG tablet TAKE 1 TABLET  BY  MOUTH TWICE A DAY WITH MEALS 180 tablet 1   Multiple Vitamins-Minerals (PRESERVISION AREDS 2 PO) Take 1 tablet by mouth 2 (two) times daily.      Needles & Syringes (1CC TB SYRINGE) MISC 1 Units by Does not apply route as needed. 4 each 3   ramipril (ALTACE) 10 MG capsule Take 1 capsule (10 mg total) by mouth daily. 90 capsule 3   valACYclovir (VALTREX) 500 MG tablet Take 500 mg by mouth daily.     VITAMIN D PO Take 1,000 Units by mouth daily at 2 PM.     No current facility-administered medications on file prior to visit.    Cardiac Studies:   Echocardiogram 04/03/2019:  Normal LV systolic function with visual EF 60-65%. Left ventricle cavity is normal in size. Normal global wall motion. Unable to evaluate diastolic function due to mitral apparatus calcification.  Appears to suggest Grade II diastolic dysfunction.  Calculated EF 77%. Left atrial cavity is moderately dilated by volume. Right atrial cavity is mild to moderately dilated. Trileaflet aortic valve. Mild to moderate aortic regurgitation. Moderate aortic valve leaflet calcification. Mild aortic valve stenosis. Aortic valve peak pressure gradient of 33.6 and mean gradient of 20 mmHg, calculated aortic valve area 1.1 cm. Moderate calcification of the mitral valve annulus. Moderate posterior mitral valve leaflet calcification. Mild (Grade I) mitral regurgitation. Structurally normal tricuspid valve. Mild to moderate tricuspid regurgitation. Mild pulmonary hypertension. Estimated pulmonary artery systolic pressure is 39 mm Hg with RA pressure estimated at 3 mm Hg. RVSP measures 39 mmHg. No significant change from 04/07/2015 report in AV gradients.   Carotid artery duplex  04/03/19  No hemodynamically significant arterial disease in the internal carotid artery bilaterally.  Antegrade right vertebral artery flow. Antegrade left vertebral artery flow. Compared to 04/01/2018, right ICA stenosis of 15-49% not present.   Abdominal Aortic  Duplex  04/16/2020:  Mild plaque observed in the mid aorta. Normal flow velocities noted.  Ectasia in the mid aorta measuring 2.26 x 2.21 x 2.2 cm is seen.  No AAA.   EKG   EKG 04/08/2021: Normal sinus rhythm at rate of 76 bpm, normal axis.  No evidence of ischemia.  PACs (2).  No change from 04/08/2020.  Assessment     ICD-10-CM   1. Aortic stenosis, moderate  I35.0 EKG 12-Lead    2. Moderate aortic regurgitation  I35.1     3. Essential hypertension  I10       No orders of the defined types were placed in this encounter. Medications Discontinued During This Encounter  Medication Reason   NITROGLYCERIN PO Error      Recommendations:   Dana Sandoval  is a 80 y.o. Caucasian female with history of hyperlipidemia, controlled diabetes, HTN, hypothyroidism, and mild to moderate aortic stenosis and mild to moderate AI. She had a normal stress test in 2011 with no evidence of ischemia and LVEF of 83%.  This is an annual visit, she remains asymptomatic without any dyspnea, PND or orthopnea.  I reviewed her labs, lipids and excellent control, renal function is normal, no change in physical examination.  She does not have a abdominal attic aneurysm.  With regard to aortic valve disorder, as there is no clinical evidence of heart failure, as her symptoms are stable, and no change in the murmur intensity, she does not need any echocardiogram presently.  I will see her back again in a year or sooner if problems.  She does have mild carotid  atherosclerosis and abdominal aortic atherosclerosis.  So aspirin will be appropriate.   Yates Decamp, MD, [facility] 04/08/2021, 10:15 AM Office: [phone]

## 2021-04-13 ENCOUNTER — Telehealth: Payer: Self-pay | Admitting: Family Medicine

## 2021-04-13 DIAGNOSIS — E119 Type 2 diabetes mellitus without complications: Secondary | ICD-10-CM

## 2021-04-13 MED ORDER — ACCU-CHEK AVIVA PLUS VI STRP: ORAL_STRIP | 2 refills | Status: DC

## 2021-04-13 NOTE — Telephone Encounter (Signed)
Rx sent 

## 2021-04-13 NOTE — Telephone Encounter (Signed)
  Encourage patient to contact the pharmacy for refills or they can request refills through [facility]  LAST APPOINTMENT DATE:  Please schedule appointment if longer than 1 year  NEXT APPOINTMENT DATE:no future appts  MEDICATION:ACCU-CHEK AVIVA PLUS test strip  Is the patient out of medication? no  PHARMACY:[facility] - [place], [place] - [address]  Let patient know to contact pharmacy at the end of the day to make sure medication is ready.  Please notify patient to allow 48-72 hours to process  CLINICAL FILLS OUT ALL BELOW:   LAST REFILL:  QTY:  REFILL DATE:    OTHER COMMENTS:    Okay for refill?  Please advise

## 2021-04-25 NOTE — Progress Notes (Signed)
[facility] Note  04/29/2021     CHIEF COMPLAINT Patient presents for Retina Follow Up  HISTORY OF PRESENT ILLNESS: Dana Sandoval is a 80 y.o. 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  This started 4 weeks ago.  I, the attending physician,  performed the HPI with the patient and updated documentation appropriately.        Comments   Patient here for 4 weeks retina follow up for exu ARMD OD. Patient states vision about the same. No eye pain.       Last edited by Bernarda Caffey, MD on 04/29/2021 11:30 AM.     Referring physician: Debbra Riding, MD [address] [place],  [place] [address]  HISTORICAL INFORMATION:  Selected notes from the MEDICAL RECORD NUMBER Referred by Dr. Zenia Resides for eval of ret heme OD   CURRENT MEDICATIONS: No current outpatient medications on file. (Ophthalmic Drugs)   No current facility-administered medications for this visit. (Ophthalmic Drugs)   Current Outpatient Medications (Other)  Medication Sig   aspirin EC 81 MG tablet Take 1 tablet (81 mg total) by mouth every other day.   atorvastatin (LIPITOR) 40 MG tablet TAKE 1 TABLET BY MOUTH ONCE DAILY AT  6PM   cyanocobalamin (,VITAMIN B-12,) 1000 MCG/ML injection INJECT 1,000 MCG (1 MLS) INTO THE MUSCLEONCE EVERY 30 DAYS   glucose blood (ACCU-CHEK AVIVA PLUS) test strip Use as directed to check BS daily. Dx: E11.9   levothyroxine (SYNTHROID) 88 MCG tablet Take 1 tablet (88 mcg total) by mouth daily.   metFORMIN (GLUCOPHAGE) 500 MG tablet TAKE 1 TABLET BY MOUTH TWICE A DAY WITH MEALS   Multiple Vitamins-Minerals (PRESERVISION AREDS 2 PO) Take 1 tablet by mouth 2 (two) times daily.    Needles & Syringes (1CC TB SYRINGE) MISC 1 Units by Does not apply route as needed.   ramipril (ALTACE) 10 MG capsule Take 1 capsule (10 mg total) by mouth daily.   valACYclovir (VALTREX) 500 MG  tablet Take 500 mg by mouth daily.   VITAMIN D PO Take 1,000 Units by mouth daily at 2 PM.   No current facility-administered medications for this visit. (Other)   REVIEW OF SYSTEMS: ROS   Positive for: Gastrointestinal, Musculoskeletal, Endocrine, Cardiovascular, Eyes Negative for: Constitutional, Neurological, Skin, Genitourinary, HENT, Respiratory, Psychiatric, Allergic/Imm, Heme/Lymph Last edited by Theodore Demark, COA on 04/29/2021  8:07 AM.     ALLERGIES Allergies  Allergen Reactions   Codeine Itching    After 3 days   Dilaudid [Hydromorphone Hcl] Itching    After 3 days   PAST MEDICAL HISTORY Past Medical History:  Diagnosis Date   Aortic stenosis    Arthritis    B12 deficiency    Diabetes mellitus    type 2   Heart murmur    aortic stenosis   Hyperlipidemia    Hypothyroidism    IBS (irritable bowel syndrome)    NSVD (normal spontaneous vaginal delivery)    X 4   Pain    INTRASCAPULAR PAIN   SAB (spontaneous abortion)    X 2   Thyroid disease    HYPOTHYROIDISM   Past Surgical History:  Procedure Laterality Date   ABDOMINAL HYSTERECTOMY  1977   TAH  (FIBROIDS)   CATARACT EXTRACTION Bilateral    DILATION AND CURETTAGE OF  UTERUS     DOPPLER ECHOCARDIOGRAPHY  2009   HAD AN ECHOCARDIOGRAM THAT SHOWED MODERATE MITRAL  ANNULAR CALCIFICATION AND A NORMAL EJECTION FRACTION OF 55%   EYE SURGERY Bilateral    Cat Sx   OTHER SURGICAL HISTORY     HYSTERECTOMY   SHOULDER SURGERY     RIGHT SHOULDER SURGERY   THYROIDECTOMY     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   RIGHT   TOTAL KNEE ARTHROPLASTY Left 09/10/2018   Procedure: TOTAL KNEE ARTHROPLASTY;  Surgeon: Melrose Nakayama, MD;  Location: [facility];  Service: Orthopedics;  Laterality: Left;    FAMILY HISTORY Family History  Problem Relation Age of Onset   Osteoporosis Mother    Heart disease Mother    Macular degeneration Mother    Heart disease Father    Cancer Father        BRAIN TUMOR   Diabetes  Maternal Aunt    Diabetes Maternal Uncle    Cancer Brother        BRAIN TUMOR   Colon cancer Neg Hx    Breast cancer Neg Hx     SOCIAL HISTORY Social History   Tobacco Use   Smoking status: Never   Smokeless tobacco: Never  Vaping Use   Vaping Use: Never used  Substance Use Topics   Alcohol use: No    Alcohol/week: 0.0 standard drinks   Drug use: No       OPHTHALMIC EXAM: Base Eye Exam     Visual Acuity (Snellen - Linear)       Right Left   Dist [place] 20/50 -1 20/30 -2   Dist ph [place] 20/30 -1 NI         Tonometry (Tonopen, 8:05 AM)       Right Left   Pressure 16 17         Pupils       Dark Light Shape React APD   Right 4 3 Round Brisk None   Left 4 3 Round Brisk None         Visual Fields (Counting fingers)       Left Right    Full          Extraocular Movement       Right Left    Full Full         Neuro/Psych     Oriented x3: Yes   Mood/Affect: Normal         Dilation     Both eyes: 1.0% Mydriacyl, 2.5% Phenylephrine @ 8:05 AM           Slit Lamp and Fundus Exam     Slit Lamp Exam       Right Left   Lids/Lashes Dermatochalasis - upper lid Dermatochalasis - upper lid   Conjunctiva/Sclera White and quiet White and quiet   Cornea 1+ Punctate epithelial erosions, fine endo pigment 1-2+ fine Punctate epithelial erosions, fine endo pigment   Anterior Chamber Deep and quiet Deep and quiet   Iris Round and dilated Round and dilated   Lens Toric PC IOL with marks at 1200 and 0600, 1+ Posterior capsular opacification PC IOL in good position, 1-2+ Posterior capsular opacification   Vitreous Vitreous syneresis, trace pigment, Posterior vitreous detachment, vitreous condensations Vitreous syneresis, Posterior vitreous detachment, vitreous condensations         Fundus Exam       Right Left   Disc mild Pallor, Sharp rim, +cupping, PPA mild Pallor, Sharp rim, +cupping,  PPA   C/D Ratio 0.7 0.7   Macula Blunted foveal reflex,  central CNV with subretinal heme along inferior aspect --stably resolved---now with pigment ring, RPE mottling, clumping and atrophy, Drusen, persistent shallow SRF overlying CNV--improved Flat, Blunted foveal reflex, Drusen, RPE mottling, clumping and atrophy, No heme or edema   Vessels attenuated, Tortuous, AV crossing changes attenuated, Tortuous, AV crossing changes   Periphery Attached, reticular degeneration, paving stone degeneration IT quad, No heme  Attached, reticular degeneration, mild inferior paving stone degeneration           IMAGING AND PROCEDURES  Imaging and Procedures for 04/29/2021  OCT, Retina - OU - Both Eyes       Right Eye Quality was good. Central Foveal Thickness: 354. Progression has improved. Findings include abnormal foveal contour, intraretinal fluid, subretinal fluid, subretinal hyper-reflective material, pigment epithelial detachment, retinal drusen , outer retinal atrophy (Persistent / slight interval improvement in SRF overlying PED, trace cystic changes).   Left Eye Quality was good. Central Foveal Thickness: 225. Progression has been stable. Findings include normal foveal contour, no IRF, no SRF, outer retinal atrophy, retinal drusen .   Notes *Images captured and stored on drive  Diagnosis / Impression:  OD: ex ARMD - Persistent / slight interval improvement in shallow SRF overlying PED, trace cystic changes OS: non-exu ARMD   Clinical management:  See below  Abbreviations: NFP - Normal foveal profile. CME - cystoid macular edema. PED - pigment epithelial detachment. IRF - intraretinal fluid. SRF - subretinal fluid. EZ - ellipsoid zone. ERM - epiretinal membrane. ORA - outer retinal atrophy. ORT - outer retinal tubulation. SRHM - subretinal hyper-reflective material. IRHM - intraretinal hyper-reflective material      Intravitreal Injection, Pharmacologic Agent - OD - Right Eye       Time Out 04/29/2021. 8:45 AM. Confirmed correct patient,  procedure, site, and patient consented.   Anesthesia Topical anesthesia was used. Anesthetic medications included Lidocaine 2%, Proparacaine 0.5%.   Procedure Preparation included 5% betadine to ocular surface, eyelid speculum. A supplied (32g) needle was used.   Injection: 1.25 mg Bevacizumab 1.77m/0.05ml   Route: Intravitreal, Site: Right Eye   NDC:: [id number] Lot: [id number] , Expiration date: 06/22/2021, Waste: 0 mL   Post-op Post injection exam found visual acuity of at least counting fingers. The patient tolerated the procedure well. There were no complications. The patient received written and verbal post procedure care education. Post injection medications were not given.            ASSESSMENT/PLAN:   ICD-10-CM   1. Exudative age-related macular degeneration of right eye with active choroidal neovascularization (HCC)  H35.3211 Intravitreal Injection, Pharmacologic Agent - OD - Right Eye    Bevacizumab (AVASTIN) SOLN 1.25 mg    2. Retinal edema  H35.81 OCT, Retina - OU - Both Eyes    3. Intermediate stage nonexudative age-related macular degeneration of left eye  H35.3122     4. Essential hypertension  I10     5. Hypertensive retinopathy of both eyes  H35.033     6. Pseudophakia, both eyes  Z96.1      1,2. Exudative age related macular degeneration, OD  - s/p IVA OD #1 (05.06.22), #2 (06.03.22), #3 (07.01.22), #4 (07.29.22), #5 (08.26.22), #6 (09.23.22)  - OCT shows persistent/slight interval improvement in shallow SRF overlying PED, trace cystic changes  - BCVA stable at 20/30  - recommend IVA OD #7 today, 10.21.22 - pt wishes to be treated with IVA - RBA of  procedure discussed, questions answered - IVA informed consent obtained and signed, 05.06.22 (OD) - see procedure note  - f/u in 4 wks -- DFE/OCT, possible injection  3. Age related macular degeneration, non-exudative, OS  - The incidence, anatomy, and pathology of dry AMD, risk of progression, and the  AREDS and AREDS 2 studies including smoking risks discussed with patient.  - Recommend amsler grid monitoring   - f/u 3 months  4,5. Hypertensive retinopathy OU - discussed importance of tight BP control - monitor   6. Pseudophakia OU  - s/p CE/IOL  - IOL in good position, doing well  - monitor   Ophthalmic Meds Ordered this visit:  Meds ordered this encounter  Medications   Bevacizumab (AVASTIN) SOLN 1.25 mg     Return in about 4 weeks (around 05/27/2021) for 4 wk f/u for exu ARMD OD w/DFE/OCT/likely inj. OD.  There are no Patient Instructions on file for this visit.  This document serves as a record of services personally performed by Gardiner Sleeper, MD, PhD. It was created on their behalf by Leonie Douglas, an ophthalmic technician. The creation of this record is the provider's dictation and/or activities during the visit.    Electronically signed by: Leonie Douglas COA, 04/29/21  11:39 AM  Gardiner Sleeper, M.D., Ph.D. Diseases & Surgery of the Retina and [place] 04/29/2021  I have reviewed the above documentation for accuracy and completeness, and I agree with the above. Gardiner Sleeper, M.D., Ph.D. 04/29/21 11:40 AM   Abbreviations: M myopia (nearsighted); A astigmatism; H hyperopia (farsighted); P presbyopia; Mrx spectacle prescription;  CTL contact lenses; OD right eye; OS left eye; OU both eyes  XT exotropia; ET esotropia; PEK punctate epithelial keratitis; PEE punctate epithelial erosions; DES dry eye syndrome; MGD meibomian gland dysfunction; ATs artificial tears; PFAT's preservative free artificial tears; [place] nuclear sclerotic cataract; PSC posterior subcapsular cataract; ERM epi-retinal membrane; PVD posterior vitreous detachment; RD retinal detachment; DM diabetes mellitus; DR diabetic retinopathy; NPDR non-proliferative diabetic retinopathy; PDR proliferative diabetic retinopathy; CSME clinically significant macular edema; DME  diabetic macular edema; dbh dot blot hemorrhages; CWS cotton wool spot; POAG primary open angle glaucoma; C/D cup-to-disc ratio; HVF humphrey visual field; GVF goldmann visual field; OCT optical coherence tomography; IOP intraocular pressure; BRVO Branch retinal vein occlusion; CRVO central retinal vein occlusion; CRAO central retinal artery occlusion; BRAO branch retinal artery occlusion; RT retinal tear; SB scleral buckle; PPV pars plana vitrectomy; VH Vitreous hemorrhage; PRP panretinal laser photocoagulation; IVK intravitreal kenalog; VMT vitreomacular traction; MH Macular hole;  NVD neovascularization of the disc; NVE neovascularization elsewhere; AREDS age related eye disease study; ARMD age related macular degeneration; POAG primary open angle glaucoma; EBMD epithelial/anterior basement membrane dystrophy; ACIOL anterior chamber intraocular lens; IOL intraocular lens; PCIOL posterior chamber intraocular lens; Phaco/IOL phacoemulsification with intraocular lens placement; [place] photorefractive keratectomy; LASIK laser assisted in situ keratomileusis; HTN hypertension; DM diabetes mellitus; COPD chronic obstructive pulmonary disease

## 2021-04-29 ENCOUNTER — Ambulatory Visit (INDEPENDENT_AMBULATORY_CARE_PROVIDER_SITE_OTHER): Payer: Medicare PPO | Admitting: Ophthalmology

## 2021-04-29 ENCOUNTER — Encounter (INDEPENDENT_AMBULATORY_CARE_PROVIDER_SITE_OTHER): Payer: Self-pay | Admitting: Ophthalmology

## 2021-04-29 ENCOUNTER — Other Ambulatory Visit: Payer: Self-pay

## 2021-04-29 DIAGNOSIS — Z961 Presence of intraocular lens: Secondary | ICD-10-CM | POA: Diagnosis not present

## 2021-04-29 DIAGNOSIS — H35033 Hypertensive retinopathy, bilateral: Secondary | ICD-10-CM

## 2021-04-29 DIAGNOSIS — I10 Essential (primary) hypertension: Secondary | ICD-10-CM | POA: Diagnosis not present

## 2021-04-29 DIAGNOSIS — H353122 Nonexudative age-related macular degeneration, left eye, intermediate dry stage: Secondary | ICD-10-CM | POA: Diagnosis not present

## 2021-04-29 DIAGNOSIS — H353211 Exudative age-related macular degeneration, right eye, with active choroidal neovascularization: Secondary | ICD-10-CM

## 2021-04-29 DIAGNOSIS — H3581 Retinal edema: Secondary | ICD-10-CM

## 2021-04-29 MED ORDER — BEVACIZUMAB CHEMO INJECTION 1.25MG/0.05ML SYRINGE FOR KALEIDOSCOPE
1.2500 mg | INTRAVITREAL | Status: AC | PRN
  Administered 2021-04-29: 1.25 mg via INTRAVITREAL

## 2021-05-24 NOTE — Progress Notes (Signed)
[facility] Note  05/27/2021     CHIEF COMPLAINT Patient presents for Retina Follow Up  HISTORY OF PRESENT ILLNESS: Dana Sandoval is a 80 y.o. 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  Severity is moderate.  Duration of 4 weeks.  Since onset it is stable.  I, the attending physician,  performed the HPI with the patient and updated documentation appropriately.        Comments   Pt here for 4 wk ret f/u for exu ARMD OD. Pt states vision is about the same. No ocular pain or discomfort. Blood sugar yesterday was 111. Most recent A1C level in May was 6.5.      Last edited by Bernarda Caffey, MD on 05/27/2021 12:56 PM.    Pt states no change in vision, she states she can see better when there is a lot of light  Referring physician: Tonia Ghent, MD [place],  [place] [address]  HISTORICAL INFORMATION:  Selected notes from the [facility] Referred by Dr. Zenia Resides for eval of ret heme OD   CURRENT MEDICATIONS: No current outpatient medications on file. (Ophthalmic Drugs)   No current facility-administered medications for this visit. (Ophthalmic Drugs)   Current Outpatient Medications (Other)  Medication Sig   aspirin EC 81 MG tablet Take 1 tablet (81 mg total) by mouth every other day.   atorvastatin (LIPITOR) 40 MG tablet TAKE 1 TABLET BY MOUTH ONCE DAILY AT  6PM   glucose blood (ACCU-CHEK AVIVA PLUS) test strip Use as directed to check BS daily. Dx: E11.9   levothyroxine (SYNTHROID) 88 MCG tablet Take 1 tablet (88 mcg total) by mouth daily.   metFORMIN (GLUCOPHAGE) 500 MG tablet TAKE 1 TABLET BY MOUTH TWICE A DAY WITH MEALS   Multiple Vitamins-Minerals (PRESERVISION AREDS 2 PO) Take 1 tablet by mouth 2 (two) times daily.    Needles & Syringes (1CC TB SYRINGE) MISC 1 Units by Does not apply route as needed.   ramipril (ALTACE) 10 MG  capsule Take 1 capsule (10 mg total) by mouth daily.   valACYclovir (VALTREX) 500 MG tablet Take 500 mg by mouth daily.   VITAMIN D PO Take 1,000 Units by mouth daily at 2 PM.   cyanocobalamin (,VITAMIN B-12,) 1000 MCG/ML injection INJECT 1,000 MCG (1 MLS) INTO THE MUSCLEONCE EVERY 30 DAYS (Patient not taking: Reported on 05/27/2021)   No current facility-administered medications for this visit. (Other)   REVIEW OF SYSTEMS: ROS   Positive for: Gastrointestinal, Musculoskeletal, Endocrine, Cardiovascular, Eyes Negative for: Constitutional, Neurological, Skin, Genitourinary, HENT, Respiratory, Psychiatric, Allergic/Imm, Heme/Lymph Last edited by Kingsley Spittle, COT on 05/27/2021  8:56 AM.      ALLERGIES Allergies  Allergen Reactions   Codeine Itching    After 3 days   Dilaudid [Hydromorphone Hcl] Itching    After 3 days   PAST MEDICAL HISTORY Past Medical History:  Diagnosis Date   Aortic stenosis    Arthritis    B12 deficiency    Diabetes mellitus    type 2   Heart murmur    aortic stenosis   Hyperlipidemia    Hypothyroidism    IBS (irritable bowel syndrome)    Macular degeneration    NSVD (normal spontaneous vaginal delivery)    X 4   Pain  INTRASCAPULAR PAIN   SAB (spontaneous abortion)    X 2   Thyroid disease    HYPOTHYROIDISM   Past Surgical History:  Procedure Laterality Date   ABDOMINAL HYSTERECTOMY  1977   TAH  (FIBROIDS)   CATARACT EXTRACTION Bilateral    DILATION AND CURETTAGE OF UTERUS     DOPPLER ECHOCARDIOGRAPHY  2009   HAD AN ECHOCARDIOGRAM THAT SHOWED MODERATE MITRAL  ANNULAR CALCIFICATION AND A NORMAL EJECTION FRACTION OF 55%   EYE SURGERY Bilateral    Cat Sx   OTHER SURGICAL HISTORY     HYSTERECTOMY   SHOULDER SURGERY     RIGHT SHOULDER SURGERY   THYROIDECTOMY     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   RIGHT   TOTAL KNEE ARTHROPLASTY Left 09/10/2018   Procedure: TOTAL KNEE ARTHROPLASTY;  Surgeon: Melrose Nakayama, MD;  Location: [facility]  [facility];  Service: Orthopedics;  Laterality: Left;    FAMILY HISTORY Family History  Problem Relation Age of Onset   Osteoporosis Mother    Heart disease Mother    Macular degeneration Mother    Heart disease Father    Cancer Father        BRAIN TUMOR   Diabetes Maternal Aunt    Diabetes Maternal Uncle    Cancer Brother        BRAIN TUMOR   Colon cancer Neg Hx    Breast cancer Neg Hx     SOCIAL HISTORY Social History   Tobacco Use   Smoking status: Never   Smokeless tobacco: Never  Vaping Use   Vaping Use: Never used  Substance Use Topics   Alcohol use: No    Alcohol/week: 0.0 standard drinks   Drug use: No       OPHTHALMIC EXAM: Base Eye Exam     Visual Acuity (Snellen - Linear)       Right Left   Dist [place] 20/40 20/30 -2   Dist ph [place] 20/30 -1 20/30 -1         Tonometry (Tonopen, 9:07 AM)       Right Left   Pressure 15 15         Pupils       Dark Light Shape React APD   Right 3 2 Round Brisk None   Left 3 2 Round Brisk None         Visual Fields (Counting fingers)       Left Right    Full Full         Extraocular Movement       Right Left    Full, Ortho Full, Ortho         Neuro/Psych     Oriented x3: Yes   Mood/Affect: Normal         Dilation     Both eyes: 1.0% Mydriacyl, 2.5% Phenylephrine @ 9:08 AM           Slit Lamp and Fundus Exam     Slit Lamp Exam       Right Left   Lids/Lashes Dermatochalasis - upper lid Dermatochalasis - upper lid   Conjunctiva/Sclera White and quiet White and quiet   Cornea 1+ Punctate epithelial erosions, fine endo pigment 1-2+ fine Punctate epithelial erosions, fine endo pigment   Anterior Chamber Deep and quiet Deep and quiet   Iris Round and dilated Round and dilated   Lens Toric PC IOL with marks at 1200 and 0600, 1+ Posterior capsular opacification PC IOL in good position, 1-2+  Posterior capsular opacification   Anterior Vitreous Vitreous syneresis, trace pigment, Posterior  vitreous detachment, vitreous condensations Vitreous syneresis, Posterior vitreous detachment, vitreous condensations         Fundus Exam       Right Left   Disc mild Pallor, Sharp rim, +cupping, PPA mild Pallor, Sharp rim, +cupping, PPA   C/D Ratio 0.7 0.7   Macula Blunted foveal reflex, central CNV with subretinal heme along inferior aspect -- stably resolved -- now with pigment ring, RPE mottling, clumping and atrophy, Drusen, shallow SRF overlying CNV -- improved, trace cystic changes temporal mac Flat, Blunted foveal reflex, Drusen, RPE mottling, clumping and atrophy, No heme or edema   Vessels attenuated, Tortuous, AV crossing changes attenuated, Tortuous, AV crossing changes   Periphery Attached, reticular degeneration, paving stone degeneration IT quad, No heme  Attached, reticular degeneration, mild inferior paving stone degeneration           IMAGING AND PROCEDURES  Imaging and Procedures for 05/27/2021  OCT, Retina - OU - Both Eyes       Right Eye Quality was good. Central Foveal Thickness: 367. Progression has improved. Findings include abnormal foveal contour, intraretinal fluid, subretinal fluid, subretinal hyper-reflective material, pigment epithelial detachment, retinal drusen , outer retinal atrophy (Stable improvement in SRF overlying PED, interval improvement in IRF temporal macula).   Left Eye Quality was good. Central Foveal Thickness: 226. Progression has been stable. Findings include normal foveal contour, no IRF, no SRF, outer retinal atrophy, retinal drusen .   Notes *Images captured and stored on drive  Diagnosis / Impression:  OD: ex ARMD - Stable improvement in SRF overlying PED, interval improvement in IRF temporal macula OS: non-exu ARMD   Clinical management:  See below  Abbreviations: NFP - Normal foveal profile. CME - cystoid macular edema. PED - pigment epithelial detachment. IRF - intraretinal fluid. SRF - subretinal fluid. EZ - ellipsoid  zone. ERM - epiretinal membrane. ORA - outer retinal atrophy. ORT - outer retinal tubulation. SRHM - subretinal hyper-reflective material. IRHM - intraretinal hyper-reflective material      Intravitreal Injection, Pharmacologic Agent - OD - Right Eye       Time Out 05/27/2021. 9:35 AM. Confirmed correct patient, procedure, site, and patient consented.   Anesthesia Topical anesthesia was used. Anesthetic medications included Lidocaine 2%, Proparacaine 0.5%.   Procedure Preparation included 5% betadine to ocular surface, eyelid speculum. A supplied needle was used.   Injection: 1.25 mg Bevacizumab 1.74m/0.05ml   Route: Intravitreal, Site: Right Eye   NDC:: 82423-536-14 Lot: [id number] , Expiration date: 07/10/2021, Waste: 0 mL   Post-op Post injection exam found visual acuity of at least counting fingers. The patient tolerated the procedure well. There were no complications. The patient received written and verbal post procedure care education. Post injection medications were not given.            ASSESSMENT/PLAN:   ICD-10-CM   1. Exudative age-related macular degeneration of right eye with active choroidal neovascularization (HCC)  H35.3211 Intravitreal Injection, Pharmacologic Agent - OD - Right Eye    Bevacizumab (AVASTIN) SOLN 1.25 mg    2. Retinal edema  H35.81 OCT, Retina - OU - Both Eyes    3. Intermediate stage nonexudative age-related macular degeneration of left eye  H35.3122     4. Essential hypertension  I10     5. Hypertensive retinopathy of both eyes  H35.033     6. Pseudophakia, both eyes  Z96.1      1,2. Exudative  age related macular degeneration, OD  - s/p IVA OD #1 (05.06.22), #2 (06.03.22), #3 (07.01.22), #4 (07.29.22), #5 (08.26.22), #6 (09.23.22), #7 (10.21.22)  - OCT shows Stable improvement in SRF overlying PED, interval improvement in IRF temporal macula  - BCVA stable at 20/30  - recommend IVA OD #8 today, 11.18.22 - pt wishes to be treated  with IVA - RBA of procedure discussed, questions answered - IVA informed consent obtained and signed, 05.06.22 (OD) - see procedure note  - f/u in 4 wks -- DFE/OCT, possible injection  3. Age related macular degeneration, non-exudative, OS  - The incidence, anatomy, and pathology of dry AMD, risk of progression, and the AREDS and AREDS 2 studies including smoking risks discussed with patient.   - Recommend amsler grid monitoring   - f/u 3 months  4,5. Hypertensive retinopathy OU - discussed importance of tight BP control - monitor   6. Pseudophakia OU  - s/p CE/IOL  - IOL in good position, doing well  - monitor   Ophthalmic Meds Ordered this visit:  Meds ordered this encounter  Medications   Bevacizumab (AVASTIN) SOLN 1.25 mg      Return in about 4 weeks (around 06/24/2021) for f/u exu ARMD OD, DFE, OCT.  There are no Patient Instructions on file for this visit.  This document serves as a record of services personally performed by Gardiner Sleeper, MD, PhD. It was created on their behalf by Leonie Douglas, an ophthalmic technician. The creation of this record is the provider's dictation and/or activities during the visit.    Electronically signed by: Leonie Douglas COA, 05/28/21  12:57 AM  This document serves as a record of services personally performed by Gardiner Sleeper, MD, PhD. It was created on their behalf by San Jetty. Owens Shark, OA an ophthalmic technician. The creation of this record is the provider's dictation and/or activities during the visit.    Electronically signed by: San Jetty. Marguerita Merles 11.18.2022 12:57 AM   Gardiner Sleeper, M.D., Ph.D. Diseases & Surgery of the Retina and [place] 05/27/2021  I have reviewed the above documentation for accuracy and completeness, and I agree with the above. Gardiner Sleeper, M.D., Ph.D. 05/28/21 12:57 AM   Abbreviations: M myopia (nearsighted); A astigmatism; H hyperopia (farsighted); P  presbyopia; Mrx spectacle prescription;  CTL contact lenses; OD right eye; OS left eye; OU both eyes  XT exotropia; ET esotropia; PEK punctate epithelial keratitis; PEE punctate epithelial erosions; DES dry eye syndrome; MGD meibomian gland dysfunction; ATs artificial tears; PFAT's preservative free artificial tears; [place] nuclear sclerotic cataract; PSC posterior subcapsular cataract; ERM epi-retinal membrane; PVD posterior vitreous detachment; RD retinal detachment; DM diabetes mellitus; DR diabetic retinopathy; NPDR non-proliferative diabetic retinopathy; PDR proliferative diabetic retinopathy; CSME clinically significant macular edema; DME diabetic macular edema; dbh dot blot hemorrhages; CWS cotton wool spot; POAG primary open angle glaucoma; C/D cup-to-disc ratio; HVF humphrey visual field; GVF goldmann visual field; OCT optical coherence tomography; IOP intraocular pressure; BRVO Branch retinal vein occlusion; CRVO central retinal vein occlusion; CRAO central retinal artery occlusion; BRAO branch retinal artery occlusion; RT retinal tear; SB scleral buckle; PPV pars plana vitrectomy; VH Vitreous hemorrhage; PRP panretinal laser photocoagulation; IVK intravitreal kenalog; VMT vitreomacular traction; MH Macular hole;  NVD neovascularization of the disc; NVE neovascularization elsewhere; AREDS age related eye disease study; ARMD age related macular degeneration; POAG primary open angle glaucoma; EBMD epithelial/anterior basement membrane dystrophy; ACIOL anterior chamber intraocular lens; IOL intraocular  lens; PCIOL posterior chamber intraocular lens; Phaco/IOL phacoemulsification with intraocular lens placement; PRK photorefractive keratectomy; LASIK laser assisted in situ keratomileusis; HTN hypertension; DM diabetes mellitus; COPD chronic obstructive pulmonary disease 

## 2021-05-27 ENCOUNTER — Ambulatory Visit (INDEPENDENT_AMBULATORY_CARE_PROVIDER_SITE_OTHER): Payer: Medicare PPO | Admitting: Ophthalmology

## 2021-05-27 ENCOUNTER — Other Ambulatory Visit: Payer: Self-pay

## 2021-05-27 ENCOUNTER — Encounter (INDEPENDENT_AMBULATORY_CARE_PROVIDER_SITE_OTHER): Payer: Self-pay | Admitting: Ophthalmology

## 2021-05-27 DIAGNOSIS — I10 Essential (primary) hypertension: Secondary | ICD-10-CM | POA: Diagnosis not present

## 2021-05-27 DIAGNOSIS — H353122 Nonexudative age-related macular degeneration, left eye, intermediate dry stage: Secondary | ICD-10-CM | POA: Diagnosis not present

## 2021-05-27 DIAGNOSIS — H3581 Retinal edema: Secondary | ICD-10-CM

## 2021-05-27 DIAGNOSIS — H35033 Hypertensive retinopathy, bilateral: Secondary | ICD-10-CM

## 2021-05-27 DIAGNOSIS — Z961 Presence of intraocular lens: Secondary | ICD-10-CM | POA: Diagnosis not present

## 2021-05-27 DIAGNOSIS — H353211 Exudative age-related macular degeneration, right eye, with active choroidal neovascularization: Secondary | ICD-10-CM | POA: Diagnosis not present

## 2021-05-27 MED ORDER — BEVACIZUMAB CHEMO INJECTION 1.25MG/0.05ML SYRINGE FOR KALEIDOSCOPE
1.2500 mg | INTRAVITREAL | Status: AC | PRN
  Administered 2021-05-27: 1.25 mg via INTRAVITREAL

## 2021-06-16 DIAGNOSIS — M85852 Other specified disorders of bone density and structure, left thigh: Secondary | ICD-10-CM | POA: Diagnosis not present

## 2021-06-16 DIAGNOSIS — Z780 Asymptomatic menopausal state: Secondary | ICD-10-CM | POA: Diagnosis not present

## 2021-06-16 DIAGNOSIS — Z1231 Encounter for screening mammogram for malignant neoplasm of breast: Secondary | ICD-10-CM | POA: Diagnosis not present

## 2021-06-16 LAB — HM MAMMOGRAPHY

## 2021-06-16 LAB — HM DEXA SCAN: HM Dexa Scan: NORMAL

## 2021-06-20 NOTE — Progress Notes (Signed)
[facility] Note  06/24/2021     CHIEF COMPLAINT Patient presents for Retina Follow Up   HISTORY OF PRESENT ILLNESS: Dana Sandoval is a 80 y.o. 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  Severity is moderate.  Duration of 4 weeks.  Since onset it is stable.  I, the attending physician,  performed the HPI with the patient and updated documentation appropriately.        Comments   Pt states she cannot tell any difference in vision, no fol or floaters      Last edited by Bernarda Caffey, MD on 06/24/2021  8:25 AM.     Pt states no change in vision  Referring physician: Tonia Ghent, MD [place],  [place] [id number]  HISTORICAL INFORMATION:  Selected notes from the MEDICAL RECORD NUMBER Referred by Dr. Zenia Resides for eval of ret heme OD   CURRENT MEDICATIONS: No current outpatient medications on file. (Ophthalmic Drugs)   No current facility-administered medications for this visit. (Ophthalmic Drugs)   Current Outpatient Medications (Other)  Medication Sig   aspirin EC 81 MG tablet Take 1 tablet (81 mg total) by mouth every other day.   atorvastatin (LIPITOR) 40 MG tablet TAKE 1 TABLET BY MOUTH ONCE DAILY AT  6PM   cyanocobalamin (,VITAMIN B-12,) 1000 MCG/ML injection INJECT 1,000 MCG (1 MLS) INTO THE MUSCLEONCE EVERY 30 DAYS (Patient not taking: Reported on 05/27/2021)   glucose blood (ACCU-CHEK AVIVA PLUS) test strip Use as directed to check BS daily. Dx: E11.9   levothyroxine (SYNTHROID) 88 MCG tablet Take 1 tablet (88 mcg total) by mouth daily.   metFORMIN (GLUCOPHAGE) 500 MG tablet TAKE 1 TABLET BY MOUTH TWICE A DAY WITH MEALS   Multiple Vitamins-Minerals (PRESERVISION AREDS 2 PO) Take 1 tablet by mouth 2 (two) times daily.    Needles & Syringes (1CC TB SYRINGE) MISC 1 Units by Does not apply route as needed.   ramipril (ALTACE) 10 MG  capsule Take 1 capsule (10 mg total) by mouth daily.   valACYclovir (VALTREX) 500 MG tablet Take 500 mg by mouth daily.   VITAMIN D PO Take 1,000 Units by mouth daily at 2 PM.   No current facility-administered medications for this visit. (Other)   REVIEW OF SYSTEMS: ROS   Positive for: Endocrine, Cardiovascular, Eyes Negative for: Constitutional, Gastrointestinal, Neurological, Skin, Genitourinary, Musculoskeletal, HENT, Respiratory, Psychiatric, Allergic/Imm, Heme/Lymph Last edited by Debbrah Alar, COT on 06/24/2021  7:53 AM.     ALLERGIES Allergies  Allergen Reactions   Codeine Itching    After 3 days   Dilaudid [Hydromorphone Hcl] Itching    After 3 days   PAST MEDICAL HISTORY Past Medical History:  Diagnosis Date   Aortic stenosis    Arthritis    B12 deficiency    Diabetes mellitus    type 2   Heart murmur    aortic stenosis   Hyperlipidemia    Hypothyroidism    IBS (irritable bowel syndrome)    Macular degeneration    NSVD (normal spontaneous vaginal delivery)    X 4   Pain    INTRASCAPULAR PAIN   SAB (spontaneous abortion)    X 2   Thyroid disease    HYPOTHYROIDISM   Past Surgical History:  Procedure Laterality Date   ABDOMINAL HYSTERECTOMY  1977   TAH  (FIBROIDS)   CATARACT EXTRACTION Bilateral    DILATION AND CURETTAGE OF UTERUS     DOPPLER ECHOCARDIOGRAPHY  2009   HAD AN ECHOCARDIOGRAM THAT SHOWED MODERATE MITRAL  ANNULAR CALCIFICATION AND A NORMAL EJECTION FRACTION OF 55%   EYE SURGERY Bilateral    Cat Sx   OTHER SURGICAL HISTORY     HYSTERECTOMY   SHOULDER SURGERY     RIGHT SHOULDER SURGERY   THYROIDECTOMY     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   RIGHT   TOTAL KNEE ARTHROPLASTY Left 09/10/2018   Procedure: TOTAL KNEE ARTHROPLASTY;  Surgeon: Melrose Nakayama, MD;  Location: [facility];  Service: Orthopedics;  Laterality: Left;    FAMILY HISTORY Family History  Problem Relation Age of Onset   Osteoporosis Mother    Heart disease Mother     Macular degeneration Mother    Heart disease Father    Cancer Father        BRAIN TUMOR   Diabetes Maternal Aunt    Diabetes Maternal Uncle    Cancer Brother        BRAIN TUMOR   Colon cancer Neg Hx    Breast cancer Neg Hx    SOCIAL HISTORY Social History   Tobacco Use   Smoking status: Never   Smokeless tobacco: Never  Vaping Use   Vaping Use: Never used  Substance Use Topics   Alcohol use: No    Alcohol/week: 0.0 standard drinks   Drug use: No       OPHTHALMIC EXAM: Base Eye Exam     Visual Acuity (Snellen - Linear)       Right Left   Dist [place] 20/40 +1 20/40 -2   Dist ph [place] 20/30 20/30 -2         Tonometry (Tonopen, 7:49 AM)       Right Left   Pressure 12 13         Pupils       Dark Light Shape React APD   Right 3 2 Round Brisk None   Left 3 2 Round Brisk None         Visual Fields (Counting fingers)       Left Right    Full Full         Extraocular Movement       Right Left    Full, Ortho Full, Ortho         Neuro/Psych     Oriented x3: Yes   Mood/Affect: Normal         Dilation     Both eyes: 1.0% Mydriacyl, 2.5% Phenylephrine @ 7:49 AM           Slit Lamp and Fundus Exam     Slit Lamp Exam       Right Left   Lids/Lashes Dermatochalasis - upper lid Dermatochalasis - upper lid   Conjunctiva/Sclera White and quiet White and quiet   Cornea 1+ Punctate epithelial erosions, fine endo pigment 1-2+ fine Punctate epithelial erosions, fine endo pigment   Anterior Chamber Deep and quiet Deep and quiet   Iris Round and dilated Round and dilated   Lens Toric PC IOL with marks at 1200 and 0600, 1+ Posterior capsular opacification PC IOL in good position, 1-2+ Posterior capsular opacification   Anterior Vitreous Vitreous syneresis, trace pigment, Posterior vitreous detachment, vitreous condensations Vitreous syneresis, Posterior vitreous detachment, vitreous condensations         Fundus Exam  Right Left   Disc mild  Pallor, Sharp rim, +cupping, PPA mild Pallor, Sharp rim, +cupping, PPA   C/D Ratio 0.6 0.6   Macula Blunted foveal reflex, central CNV with subretinal heme along inferior aspect -- stably resolved -- now with pigment ring, RPE mottling, clumping and atrophy, Drusen, shallow SRF overlying CNV -- improved, trace cystic changes temporal mac - persistent Flat, Blunted foveal reflex, Drusen, RPE mottling, clumping and atrophy, No heme or edema   Vessels attenuated, mild tortuousity attenuated, mild tortuousity   Periphery Attached, reticular degeneration, paving stone degeneration IT quad, No heme Attached, reticular degeneration, mild inferior paving stone degeneration           IMAGING AND PROCEDURES  Imaging and Procedures for 06/24/2021  OCT, Retina - OU - Both Eyes       Right Eye Quality was good. Central Foveal Thickness: 356. Progression has improved. Findings include abnormal foveal contour, intraretinal fluid, subretinal hyper-reflective material, pigment epithelial detachment, retinal drusen , outer retinal atrophy, no SRF (Stable improvement in SRF overlying PED, persistent IRF temporal macula).   Left Eye Quality was good. Central Foveal Thickness: 223. Progression has been stable. Findings include normal foveal contour, no IRF, no SRF, outer retinal atrophy, retinal drusen .   Notes *Images captured and stored on drive  Diagnosis / Impression:  OD: ex ARMD - Stable improvement in SRF overlying PED, persistent IRF temporal macula OS: non-exu ARMD   Clinical management:  See below  Abbreviations: NFP - Normal foveal profile. CME - cystoid macular edema. PED - pigment epithelial detachment. IRF - intraretinal fluid. SRF - subretinal fluid. EZ - ellipsoid zone. ERM - epiretinal membrane. ORA - outer retinal atrophy. ORT - outer retinal tubulation. SRHM - subretinal hyper-reflective material. IRHM - intraretinal hyper-reflective material      Intravitreal Injection,  Pharmacologic Agent - OD - Right Eye       Time Out 06/24/2021. 7:55 AM. Confirmed correct patient, procedure, site, and patient consented.   Anesthesia Topical anesthesia was used. Anesthetic medications included Lidocaine 2%, Proparacaine 0.5%.   Procedure Preparation included 5% betadine to ocular surface, eyelid speculum. A supplied needle was used.   Injection: 1.25 mg Bevacizumab 1.61m/0.05ml   Route: Intravitreal, Site: Right Eye   NDC:: 50539-767-34 Lot: [id number] , Expiration date: 08/16/2021, Waste: 0 mL   Post-op Post injection exam found visual acuity of at least counting fingers. The patient tolerated the procedure well. There were no complications. The patient received written and verbal post procedure care education. Post injection medications were not given.             ASSESSMENT/PLAN:   ICD-10-CM   1. Exudative age-related macular degeneration of right eye with active choroidal neovascularization (HCC)  H35.3211 OCT, Retina - OU - Both Eyes    Intravitreal Injection, Pharmacologic Agent - OD - Right Eye    Bevacizumab (AVASTIN) SOLN 1.25 mg    2. Intermediate stage nonexudative age-related macular degeneration of left eye  H35.3122     3. Essential hypertension  I10     4. Hypertensive retinopathy of both eyes  H35.033     5. Pseudophakia, both eyes  Z96.1       1. Exudative age related macular degeneration, OD  - s/p IVA OD #1 (05.06.22), #2 (06.03.22), #3 (07.01.22), #4 (07.29.22), #5 (08.26.22), #6 (09.23.22), #7 (10.21.22), #8 (11.18.22)  - OCT shows Stable improvement in SRF overlying PED, persistent IRF temporal macula at 4 wks  - BCVA stable at 20/30  -  recommend IVA OD #9 today, 12.16.22 w/ ext to 5 wks - pt wishes to be treated with IVA - RBA of procedure discussed, questions answered - IVA informed consent obtained and signed, 05.06.22 (OD) - see procedure note  - f/u in 5 wks -- DFE/OCT, possible injection  2. Age related macular  degeneration, non-exudative, OS  - The incidence, anatomy, and pathology of dry AMD, risk of progression, and the AREDS and AREDS 2 studies including smoking risks discussed with patient.   - Recommend amsler grid monitoring   3,4. Hypertensive retinopathy OU - discussed importance of tight BP control - monitor   5. Pseudophakia OU  - s/p CE/IOL  - IOL in good position, doing well  - monitor   Ophthalmic Meds Ordered this visit:  Meds ordered this encounter  Medications   Bevacizumab (AVASTIN) SOLN 1.25 mg       Return in about 5 weeks (around 07/29/2021) for f/u exu ARMD OD, DFE, OCT.  There are no Patient Instructions on file for this visit.  This document serves as a record of services personally performed by Gardiner Sleeper, MD, PhD. It was created on their behalf by Leonie Douglas, an ophthalmic technician. The creation of this record is the provider's dictation and/or activities during the visit.    Electronically signed by: Leonie Douglas COA, 06/24/21  8:29 AM  This document serves as a record of services personally performed by Gardiner Sleeper, MD, PhD. It was created on their behalf by San Jetty. Owens Shark, OA an ophthalmic technician. The creation of this record is the provider's dictation and/or activities during the visit.    Electronically signed by: San Jetty. Owens Shark, [place] 12.16.2022 8:29 AM    Gardiner Sleeper, M.D., Ph.D. Diseases & Surgery of the Retina and [place] 06/24/2021  I have reviewed the above documentation for accuracy and completeness, and I agree with the above. Gardiner Sleeper, M.D., Ph.D. 06/24/21 8:30 AM  Abbreviations: M myopia (nearsighted); A astigmatism; H hyperopia (farsighted); P presbyopia; Mrx spectacle prescription;  CTL contact lenses; OD right eye; OS left eye; OU both eyes  XT exotropia; ET esotropia; PEK punctate epithelial keratitis; PEE punctate epithelial erosions; DES dry eye syndrome; MGD meibomian  gland dysfunction; ATs artificial tears; PFAT's preservative free artificial tears; [place] nuclear sclerotic cataract; PSC posterior subcapsular cataract; ERM epi-retinal membrane; PVD posterior vitreous detachment; RD retinal detachment; DM diabetes mellitus; DR diabetic retinopathy; NPDR non-proliferative diabetic retinopathy; PDR proliferative diabetic retinopathy; CSME clinically significant macular edema; DME diabetic macular edema; dbh dot blot hemorrhages; CWS cotton wool spot; POAG primary open angle glaucoma; C/D cup-to-disc ratio; HVF humphrey visual field; GVF goldmann visual field; OCT optical coherence tomography; IOP intraocular pressure; BRVO Branch retinal vein occlusion; CRVO central retinal vein occlusion; CRAO central retinal artery occlusion; BRAO branch retinal artery occlusion; RT retinal tear; SB scleral buckle; PPV pars plana vitrectomy; VH Vitreous hemorrhage; PRP panretinal laser photocoagulation; IVK intravitreal kenalog; VMT vitreomacular traction; MH Macular hole;  NVD neovascularization of the disc; NVE neovascularization elsewhere; AREDS age related eye disease study; ARMD age related macular degeneration; POAG primary open angle glaucoma; EBMD epithelial/anterior basement membrane dystrophy; ACIOL anterior chamber intraocular lens; IOL intraocular lens; PCIOL posterior chamber intraocular lens; Phaco/IOL phacoemulsification with intraocular lens placement; [place] photorefractive keratectomy; LASIK laser assisted in situ keratomileusis; HTN hypertension; DM diabetes mellitus; COPD chronic obstructive pulmonary disease

## 2021-06-24 ENCOUNTER — Encounter (INDEPENDENT_AMBULATORY_CARE_PROVIDER_SITE_OTHER): Payer: Self-pay | Admitting: Ophthalmology

## 2021-06-24 ENCOUNTER — Other Ambulatory Visit: Payer: Self-pay

## 2021-06-24 ENCOUNTER — Ambulatory Visit (INDEPENDENT_AMBULATORY_CARE_PROVIDER_SITE_OTHER): Payer: Medicare PPO | Admitting: Ophthalmology

## 2021-06-24 DIAGNOSIS — H353211 Exudative age-related macular degeneration, right eye, with active choroidal neovascularization: Secondary | ICD-10-CM

## 2021-06-24 DIAGNOSIS — H35033 Hypertensive retinopathy, bilateral: Secondary | ICD-10-CM | POA: Diagnosis not present

## 2021-06-24 DIAGNOSIS — H353122 Nonexudative age-related macular degeneration, left eye, intermediate dry stage: Secondary | ICD-10-CM | POA: Diagnosis not present

## 2021-06-24 DIAGNOSIS — I10 Essential (primary) hypertension: Secondary | ICD-10-CM | POA: Diagnosis not present

## 2021-06-24 DIAGNOSIS — H3581 Retinal edema: Secondary | ICD-10-CM

## 2021-06-24 DIAGNOSIS — Z961 Presence of intraocular lens: Secondary | ICD-10-CM

## 2021-06-24 MED ORDER — BEVACIZUMAB CHEMO INJECTION 1.25MG/0.05ML SYRINGE FOR KALEIDOSCOPE
1.2500 mg | INTRAVITREAL | Status: AC | PRN
Start: 2021-06-24 — End: 2021-06-24
  Administered 2021-06-24: 1.25 mg via INTRAVITREAL

## 2021-07-25 ENCOUNTER — Other Ambulatory Visit: Payer: Medicare PPO

## 2021-07-27 ENCOUNTER — Other Ambulatory Visit: Payer: Self-pay | Admitting: Family Medicine

## 2021-07-27 DIAGNOSIS — E119 Type 2 diabetes mellitus without complications: Secondary | ICD-10-CM

## 2021-07-27 DIAGNOSIS — E785 Hyperlipidemia, unspecified: Secondary | ICD-10-CM

## 2021-07-27 NOTE — Progress Notes (Signed)
[facility] Note  07/29/2021     CHIEF COMPLAINT Patient presents for Retina Follow Up  HISTORY OF PRESENT ILLNESS: Dana Sandoval is a 81 y.o. 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  This started 5 weeks ago.  I, the attending physician,  performed the HPI with the patient and updated documentation appropriately.        Comments   Patient here for 5 weeks retina follow up for exu ARMD OD. Patient states vision about like has been. No difference. No eye pain. Since last visit has floaters. Sees bugs and spiders and things crawling on the floor.      Last edited by Bernarda Caffey, MD on 07/29/2021  8:44 AM.    Pt reports more noticeable floaters OD since last injection.  Referring physician: Debbra Riding, MD [address] [place],  [place] [address]  HISTORICAL INFORMATION:  Selected notes from the MEDICAL RECORD NUMBER Referred by Dr. Zenia Resides for eval of ret heme OD   CURRENT MEDICATIONS: No current outpatient medications on file. (Ophthalmic Drugs)   No current facility-administered medications for this visit. (Ophthalmic Drugs)   Current Outpatient Medications (Other)  Medication Sig   aspirin EC 81 MG tablet Take 1 tablet (81 mg total) by mouth every other day.   atorvastatin (LIPITOR) 40 MG tablet TAKE 1 TABLET BY MOUTH ONCE A DAY AT 6PM   glucose blood (ACCU-CHEK AVIVA PLUS) test strip Use as directed to check BS daily. Dx: E11.9   levothyroxine (SYNTHROID) 88 MCG tablet Take 1 tablet (88 mcg total) by mouth daily.   metFORMIN (GLUCOPHAGE) 500 MG tablet TAKE 1 TABLET BY MOUTH TWICE A DAY WITH MEALS   Multiple Vitamins-Minerals (PRESERVISION AREDS 2 PO) Take 1 tablet by mouth 2 (two) times daily.    Needles & Syringes (1CC TB SYRINGE) MISC 1 Units by Does not apply route as needed.   ramipril (ALTACE) 10 MG capsule TAKE 1 CAPSULE BY MOUTH ONCE  DAILY   valACYclovir (VALTREX) 500 MG tablet Take 500 mg by mouth daily.   VITAMIN D PO Take 1,000 Units by mouth daily at 2 PM.   cyanocobalamin (,VITAMIN B-12,) 1000 MCG/ML injection INJECT 1,000 MCG (1 MLS) INTO THE MUSCLEONCE EVERY 30 DAYS (Patient not taking: Reported on 05/27/2021)   No current facility-administered medications for this visit. (Other)   REVIEW OF SYSTEMS: ROS   Positive for: Endocrine, Cardiovascular, Eyes Negative for: Constitutional, Gastrointestinal, Neurological, Skin, Genitourinary, Musculoskeletal, HENT, Respiratory, Psychiatric, Allergic/Imm, Heme/Lymph Last edited by Theodore Demark, COA on 07/29/2021  7:49 AM.     ALLERGIES Allergies  Allergen Reactions   Codeine Itching    After 3 days   Dilaudid [Hydromorphone Hcl] Itching    After 3 days   PAST MEDICAL HISTORY Past Medical History:  Diagnosis Date   Aortic stenosis    Arthritis    B12 deficiency    Diabetes mellitus    type 2   Heart murmur    aortic stenosis   Hyperlipidemia    Hypothyroidism    IBS (irritable bowel syndrome)    Macular degeneration    NSVD (normal spontaneous vaginal delivery)    X 4   Pain    INTRASCAPULAR PAIN   SAB (spontaneous abortion)    X 2   Thyroid disease  HYPOTHYROIDISM   Past Surgical History:  Procedure Laterality Date   ABDOMINAL HYSTERECTOMY  1977   TAH  (FIBROIDS)   CATARACT EXTRACTION Bilateral    DILATION AND CURETTAGE OF UTERUS     DOPPLER ECHOCARDIOGRAPHY  2009   HAD AN ECHOCARDIOGRAM THAT SHOWED MODERATE MITRAL  ANNULAR CALCIFICATION AND A NORMAL EJECTION FRACTION OF 55%   EYE SURGERY Bilateral    Cat Sx   OTHER SURGICAL HISTORY     HYSTERECTOMY   SHOULDER SURGERY     RIGHT SHOULDER SURGERY   THYROIDECTOMY     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   RIGHT   TOTAL KNEE ARTHROPLASTY Left 09/10/2018   Procedure: TOTAL KNEE ARTHROPLASTY;  Surgeon: Melrose Nakayama, MD;  Location: [facility];  Service: Orthopedics;  Laterality: Left;    FAMILY HISTORY Family History  Problem Relation Age of Onset   Osteoporosis Mother    Heart disease Mother    Macular degeneration Mother    Heart disease Father    Cancer Father        BRAIN TUMOR   Diabetes Maternal Aunt    Diabetes Maternal Uncle    Cancer Brother        BRAIN TUMOR   Colon cancer Neg Hx    Breast cancer Neg Hx    SOCIAL HISTORY Social History   Tobacco Use   Smoking status: Never   Smokeless tobacco: Never  Vaping Use   Vaping Use: Never used  Substance Use Topics   Alcohol use: No    Alcohol/week: 0.0 standard drinks   Drug use: No       OPHTHALMIC EXAM: Base Eye Exam     Visual Acuity (Snellen - Linear)       Right Left   Dist [place] 20/40 -2 20/30 -1   Dist ph  20/30 -1 NI         Tonometry (Tonopen, 7:46 AM)       Right Left   Pressure 12 15         Pupils       Dark Light Shape React APD   Right 3 2 Round Brisk None   Left 3 2 Round Brisk None         Visual Fields (Counting fingers)       Left Right    Full Full         Extraocular Movement       Right Left    Full, Ortho Full, Ortho         Neuro/Psych     Oriented x3: Yes   Mood/Affect: Normal         Dilation     Both eyes: 1.0% Mydriacyl, 2.5% Phenylephrine @ 7:46 AM           Slit Lamp and Fundus Exam     Slit Lamp Exam       Right Left   Lids/Lashes Dermatochalasis - upper lid Dermatochalasis - upper lid   Conjunctiva/Sclera White and quiet White and quiet   Cornea 1+ Punctate epithelial erosions, fine endo pigment 1-2+ fine Punctate epithelial erosions, fine endo pigment   Anterior Chamber Deep and quiet Deep and quiet   Iris Round and dilated Round and dilated   Lens Toric PC IOL with marks at 1200 and 0600, 1+ Posterior capsular opacification PC IOL in good position, 1-2+ Posterior capsular opacification   Anterior Vitreous Vitreous syneresis, trace pigment, Posterior vitreous detachment, vitreous condensations Vitreous  syneresis, Posterior vitreous  detachment, vitreous condensations         Fundus Exam       Right Left   Disc mild Pallor, Sharp rim, +cupping, PPA mild Pallor, Sharp rim, +cupping, PPA   C/D Ratio 0.6 0.6   Macula Blunted foveal reflex, central CNV with subretinal heme along inferior aspect -- stably resolved -- now with pigment ring, RPE mottling, clumping and atrophy, Drusen, shallow SRF overlying CNV -- stably improved, trace cystic changes temporal mac - persistent Flat, Blunted foveal reflex, Drusen, RPE mottling, clumping and atrophy, No heme or edema   Vessels attenuated, mild tortuousity attenuated, mild tortuousity   Periphery Attached, reticular degeneration, paving stone degeneration IT quad, No heme Attached, reticular degeneration, mild inferior paving stone degeneration           IMAGING AND PROCEDURES  Imaging and Procedures for 07/29/2021  OCT, Retina - OU - Both Eyes       Right Eye Quality was good. Central Foveal Thickness: 366. Progression has been stable. Findings include abnormal foveal contour, intraretinal fluid, subretinal hyper-reflective material, pigment epithelial detachment, retinal drusen , outer retinal atrophy, no SRF (Stable improvement in SRF overlying PED, persistent IRF temporal macula--?mild increase).   Left Eye Quality was good. Central Foveal Thickness: 220. Progression has been stable. Findings include normal foveal contour, no IRF, no SRF, outer retinal atrophy, retinal drusen .   Notes *Images captured and stored on drive  Diagnosis / Impression:  OD: Stable improvement in SRF overlying PED, persistent IRF temporal macula--?mild increase OS: non-exu ARMD   Clinical management:  See below  Abbreviations: NFP - Normal foveal profile. CME - cystoid macular edema. PED - pigment epithelial detachment. IRF - intraretinal fluid. SRF - subretinal fluid. EZ - ellipsoid zone. ERM - epiretinal membrane. ORA - outer retinal atrophy. ORT - outer  retinal tubulation. SRHM - subretinal hyper-reflective material. IRHM - intraretinal hyper-reflective material      Intravitreal Injection, Pharmacologic Agent - OD - Right Eye       Time Out 07/29/2021. 8:01 AM. Confirmed correct patient, procedure, site, and patient consented.   Anesthesia Topical anesthesia was used. Anesthetic medications included Lidocaine 2%, Proparacaine 0.5%.   Procedure Preparation included 5% betadine to ocular surface, eyelid speculum. A supplied needle was used.   Injection: 1.25 mg Bevacizumab 1.31m/0.05ml   Route: Intravitreal, Site: Right Eye   NDC: [id number] Lot: [id number] Expiration date: 08/13/2021, Waste: 0.05 mL   Post-op Post injection exam found visual acuity of at least counting fingers. The patient tolerated the procedure well. There were no complications. The patient received written and verbal post procedure care education. Post injection medications were not given.            ASSESSMENT/PLAN:   ICD-10-CM   1. Exudative age-related macular degeneration of right eye with active choroidal neovascularization (HCC)  H35.3211 OCT, Retina - OU - Both Eyes    Intravitreal Injection, Pharmacologic Agent - OD - Right Eye    Bevacizumab (AVASTIN) SOLN 1.25 mg    2. Intermediate stage nonexudative age-related macular degeneration of left eye  H35.3122 OCT, Retina - OU - Both Eyes    3. Essential hypertension  I10     4. Hypertensive retinopathy of both eyes  H35.033     5. Pseudophakia, both eyes  Z96.1      1. Exudative age related macular degeneration, OD  - s/p IVA OD #1 (05.06.22), #2 (06.03.22), #3 (07.01.22), #4 (07.29.22), #5 (08.26.22), #6 (09.23.22), #7 (10.21.22), #8 (11.18.22), #  9 (12.16.22)  - OCT shows stable improvement in SRF overlying PED, persistent IRF temporal macula--?mild increase at 5 wks  - BCVA stable at 20/30  - recommend IVA OD #10 today, 01.20.23 w/ f/u in 5 wks again - pt wishes to be treated with IVA -  RBA of procedure discussed, questions answered - IVA informed consent obtained and signed, 05.06.22 (OD) - see procedure note  - f/u in 5 wks -- DFE/OCT, possible injection  2. Age related macular degeneration, non-exudative, OS  - The incidence, anatomy, and pathology of dry AMD, risk of progression, and the AREDS and AREDS 2 studies including smoking risks discussed with patient.  - Recommend amsler grid monitoring   3,4. Hypertensive retinopathy OU - discussed importance of tight BP control - monitor   5. Pseudophakia OU  - s/p CE/IOL  - IOL in good position, doing well  - monitor   Ophthalmic Meds Ordered this visit:  Meds ordered this encounter  Medications   Bevacizumab (AVASTIN) SOLN 1.25 mg      Return in about 5 weeks (around 09/02/2021) for 5 wk f/u for exu ARMD OD w/DFE/OCT/likely IVA OD.  There are no Patient Instructions on file for this visit.  This document serves as a record of services personally performed by Gardiner Sleeper, MD, PhD. It was created on their behalf by Leonie Douglas, an ophthalmic technician. The creation of this record is the provider's dictation and/or activities during the visit.    Electronically signed by: Leonie Douglas COA, 07/29/21  8:47 AM  This document serves as a record of services personally performed by Gardiner Sleeper, MD, PhD. It was created on their behalf by Estill Bakes, COT an ophthalmic technician. The creation of this record is the provider's dictation and/or activities during the visit.    Electronically signed by: Estill Bakes, COT 1.20.23 @ 8:47 AM   Gardiner Sleeper, M.D., Ph.D. Diseases & Surgery of the Retina and [place] 1.20.23  I have reviewed the above documentation for accuracy and completeness, and I agree with the above. Gardiner Sleeper, M.D., Ph.D. 07/29/21 8:47 AM   Abbreviations: M myopia (nearsighted); A astigmatism; H hyperopia (farsighted); P presbyopia; Mrx  spectacle prescription;  CTL contact lenses; OD right eye; OS left eye; OU both eyes  XT exotropia; ET esotropia; PEK punctate epithelial keratitis; PEE punctate epithelial erosions; DES dry eye syndrome; MGD meibomian gland dysfunction; ATs artificial tears; PFAT's preservative free artificial tears; Jackson nuclear sclerotic cataract; PSC posterior subcapsular cataract; ERM epi-retinal membrane; PVD posterior vitreous detachment; RD retinal detachment; DM diabetes mellitus; DR diabetic retinopathy; NPDR non-proliferative diabetic retinopathy; PDR proliferative diabetic retinopathy; CSME clinically significant macular edema; DME diabetic macular edema; dbh dot blot hemorrhages; CWS cotton wool spot; POAG primary open angle glaucoma; C/D cup-to-disc ratio; HVF humphrey visual field; GVF goldmann visual field; OCT optical coherence tomography; IOP intraocular pressure; BRVO Branch retinal vein occlusion; CRVO central retinal vein occlusion; CRAO central retinal artery occlusion; BRAO branch retinal artery occlusion; RT retinal tear; SB scleral buckle; PPV pars plana vitrectomy; VH Vitreous hemorrhage; PRP panretinal laser photocoagulation; IVK intravitreal kenalog; VMT vitreomacular traction; MH Macular hole;  NVD neovascularization of the disc; NVE neovascularization elsewhere; AREDS age related eye disease study; ARMD age related macular degeneration; POAG primary open angle glaucoma; EBMD epithelial/anterior basement membrane dystrophy; ACIOL anterior chamber intraocular lens; IOL intraocular lens; PCIOL posterior chamber intraocular lens; Phaco/IOL phacoemulsification with intraocular lens placement; PRK photorefractive keratectomy; LASIK laser  assisted in situ keratomileusis; HTN hypertension; DM diabetes mellitus; COPD chronic obstructive pulmonary disease

## 2021-07-29 ENCOUNTER — Encounter (INDEPENDENT_AMBULATORY_CARE_PROVIDER_SITE_OTHER): Payer: Self-pay | Admitting: Ophthalmology

## 2021-07-29 ENCOUNTER — Ambulatory Visit (INDEPENDENT_AMBULATORY_CARE_PROVIDER_SITE_OTHER): Payer: Medicare PPO | Admitting: Ophthalmology

## 2021-07-29 ENCOUNTER — Other Ambulatory Visit: Payer: Self-pay

## 2021-07-29 DIAGNOSIS — H35033 Hypertensive retinopathy, bilateral: Secondary | ICD-10-CM | POA: Diagnosis not present

## 2021-07-29 DIAGNOSIS — I10 Essential (primary) hypertension: Secondary | ICD-10-CM | POA: Diagnosis not present

## 2021-07-29 DIAGNOSIS — H353122 Nonexudative age-related macular degeneration, left eye, intermediate dry stage: Secondary | ICD-10-CM

## 2021-07-29 DIAGNOSIS — H353211 Exudative age-related macular degeneration, right eye, with active choroidal neovascularization: Secondary | ICD-10-CM

## 2021-07-29 DIAGNOSIS — Z961 Presence of intraocular lens: Secondary | ICD-10-CM | POA: Diagnosis not present

## 2021-07-29 MED ORDER — BEVACIZUMAB CHEMO INJECTION 1.25MG/0.05ML SYRINGE FOR KALEIDOSCOPE
1.2500 mg | INTRAVITREAL | Status: AC | PRN
  Administered 2021-07-29: 1.25 mg via INTRAVITREAL

## 2021-08-01 ENCOUNTER — Encounter: Payer: Medicare PPO | Admitting: Family Medicine

## 2021-08-17 ENCOUNTER — Ambulatory Visit: Payer: Medicare PPO

## 2021-08-26 NOTE — Progress Notes (Signed)
[facility] Note  09/02/2021     CHIEF COMPLAINT Patient presents for Retina Follow Up  HISTORY OF PRESENT ILLNESS: Dana Sandoval is a 81 y.o. 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  This started 5 weeks ago.  I, the attending physician,  performed the HPI with the patient and updated documentation appropriately.        Comments   Patient here for 5 weeks retina follow up for exu ARMD OD. Patient states vision about the same. The last 3 days not as good. No eye pain.       Last edited by Bernarda Caffey, MD on 09/02/2021  9:11 AM.     Referring physician: Tonia Ghent, MD [place],  [place] [address]  HISTORICAL INFORMATION:  Selected notes from the MEDICAL RECORD NUMBER Referred by Dr. Zenia Resides for eval of ret heme OD   CURRENT MEDICATIONS: No current outpatient medications on file. (Ophthalmic Drugs)   No current facility-administered medications for this visit. (Ophthalmic Drugs)   Current Outpatient Medications (Other)  Medication Sig   aspirin EC 81 MG tablet Take 1 tablet (81 mg total) by mouth every other day.   atorvastatin (LIPITOR) 40 MG tablet TAKE 1 TABLET BY MOUTH ONCE A DAY AT 6PM   glucose blood (ACCU-CHEK AVIVA PLUS) test strip Use as directed to check BS daily. Dx: E11.9   levothyroxine (SYNTHROID) 88 MCG tablet Take 1 tablet (88 mcg total) by mouth daily.   metFORMIN (GLUCOPHAGE) 500 MG tablet TAKE 1 TABLET BY MOUTH TWICE A DAY WITH MEALS   Multiple Vitamins-Minerals (PRESERVISION AREDS 2 PO) Take 1 tablet by mouth 2 (two) times daily.    ramipril (ALTACE) 10 MG capsule TAKE 1 CAPSULE BY MOUTH ONCE DAILY   valACYclovir (VALTREX) 500 MG tablet Take 500 mg by mouth daily. As needed   VITAMIN D PO Take 1,000 Units by mouth daily at 2 PM.   No current facility-administered medications for this visit. (Other)   REVIEW  OF SYSTEMS: ROS   Positive for: Endocrine, Cardiovascular, Eyes Negative for: Constitutional, Gastrointestinal, Neurological, Skin, Genitourinary, Musculoskeletal, HENT, Respiratory, Psychiatric, Allergic/Imm, Heme/Lymph Last edited by Theodore Demark, COA on 09/02/2021  8:16 AM.     ALLERGIES Allergies  Allergen Reactions   Codeine Itching    After 3 days   Dilaudid [Hydromorphone Hcl] Itching    After 3 days   PAST MEDICAL HISTORY Past Medical History:  Diagnosis Date   Aortic stenosis    Arthritis    B12 deficiency    Diabetes mellitus    type 2   Heart murmur    aortic stenosis   Hyperlipidemia    Hypothyroidism    IBS (irritable bowel syndrome)    Macular degeneration    NSVD (normal spontaneous vaginal delivery)    X 4   Pain    INTRASCAPULAR PAIN   SAB (spontaneous abortion)    X 2   Thyroid disease    HYPOTHYROIDISM   Past Surgical History:  Procedure Laterality Date   ABDOMINAL HYSTERECTOMY  1977   TAH  (FIBROIDS)   CATARACT EXTRACTION Bilateral    DILATION AND CURETTAGE OF UTERUS     DOPPLER ECHOCARDIOGRAPHY  2009   HAD AN ECHOCARDIOGRAM THAT SHOWED MODERATE MITRAL  ANNULAR CALCIFICATION AND A NORMAL EJECTION  FRACTION OF 55%   EYE SURGERY Bilateral    Cat Sx   OTHER SURGICAL HISTORY     HYSTERECTOMY   SHOULDER SURGERY     RIGHT SHOULDER SURGERY   THYROIDECTOMY     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   RIGHT   TOTAL KNEE ARTHROPLASTY Left 09/10/2018   Procedure: TOTAL KNEE ARTHROPLASTY;  Surgeon: Melrose Nakayama, MD;  Location: [facility];  Service: Orthopedics;  Laterality: Left;   FAMILY HISTORY Family History  Problem Relation Age of Onset   Osteoporosis Mother    Heart disease Mother    Macular degeneration Mother    Heart disease Father    Cancer Father        BRAIN TUMOR   Diabetes Maternal Aunt    Diabetes Maternal Uncle    Cancer Brother        BRAIN TUMOR   Colon cancer Neg Hx    Breast cancer Neg Hx    SOCIAL HISTORY Social  History   Tobacco Use   Smoking status: Never   Smokeless tobacco: Never  Vaping Use   Vaping Use: Never used  Substance Use Topics   Alcohol use: No    Alcohol/week: 0.0 standard drinks   Drug use: No       OPHTHALMIC EXAM: Base Eye Exam     Visual Acuity (Snellen - Linear)       Right Left   Dist [place] 20/40 20/30 -1   Dist ph  20/40 +2 NI         Tonometry (Tonopen, 8:13 AM)       Right Left   Pressure 13 13         Pupils       Dark Light Shape React APD   Right 3 2 Round Brisk None   Left 3 2 Round Brisk None         Visual Fields (Counting fingers)       Left Right    Full Full         Extraocular Movement       Right Left    Full, Ortho Full, Ortho         Neuro/Psych     Oriented x3: Yes   Mood/Affect: Normal         Dilation     Both eyes: 1.0% Mydriacyl, 2.5% Phenylephrine @ 8:13 AM           Slit Lamp and Fundus Exam     Slit Lamp Exam       Right Left   Lids/Lashes Dermatochalasis - upper lid Dermatochalasis - upper lid   Conjunctiva/Sclera White and quiet White and quiet   Cornea 1+ Punctate epithelial erosions, fine endo pigment 1-2+ fine Punctate epithelial erosions, fine endo pigment   Anterior Chamber Deep and quiet Deep and quiet   Iris Round and dilated Round and dilated   Lens Toric PC IOL with marks at 1200 and 0600, 1+ Posterior capsular opacification PC IOL in good position, 1-2+ Posterior capsular opacification   Anterior Vitreous Vitreous syneresis, trace pigment, Posterior vitreous detachment, vitreous condensations Vitreous syneresis, Posterior vitreous detachment, vitreous condensations         Fundus Exam       Right Left   Disc mild Pallor, Sharp rim, +cupping, PPA mild Pallor, Sharp rim, +cupping, PPA   C/D Ratio 0.6 0.6   Macula Blunted foveal reflex, central CNV with subretinal heme-- stably resolved -- now with pigment ring,  RPE mottling, clumping and atrophy, Drusen, shallow SRF  overlying CNV -- stably improved, cystic changes temporal mac - increased Flat, Blunted foveal reflex, Drusen, RPE mottling, clumping and atrophy, No heme or edema   Vessels attenuated, mild tortuousity attenuated, mild tortuousity   Periphery Attached, reticular degeneration, paving stone degeneration IT quad, No heme Attached, reticular degeneration, mild inferior paving stone degeneration           IMAGING AND PROCEDURES  Imaging and Procedures for 09/02/2021  OCT, Retina - OU - Both Eyes       Right Eye Quality was good. Central Foveal Thickness: 403. Progression has worsened. Findings include abnormal foveal contour, intraretinal fluid, subretinal hyper-reflective material, pigment epithelial detachment, retinal drusen , outer retinal atrophy, no SRF (Stable improvement in SRF overlying PED, interval increase in IRFand edema temporal fovea overlying central PED / [facility]).   Left Eye Quality was good. Central Foveal Thickness: 224. Progression has been stable. Findings include normal foveal contour, no IRF, no SRF, outer retinal atrophy, retinal drusen .   Notes *Images captured and stored on drive  Diagnosis / Impression:  OD: Stable improvement in SRF overlying PED, interval increase in IRFand edema temporal fovea overlying central PED / SRHM OS: non-exu ARMD   Clinical management:  See below  Abbreviations: NFP - Normal foveal profile. CME - cystoid macular edema. PED - pigment epithelial detachment. IRF - intraretinal fluid. SRF - subretinal fluid. EZ - ellipsoid zone. ERM - epiretinal membrane. ORA - outer retinal atrophy. ORT - outer retinal tubulation. SRHM - subretinal hyper-reflective material. IRHM - intraretinal hyper-reflective material      Intravitreal Injection, Pharmacologic Agent - OD - Right Eye       Time Out 09/02/2021. 8:43 AM. Confirmed correct patient, procedure, site, and patient consented.   Anesthesia Topical anesthesia was used. Anesthetic  medications included Lidocaine 2%, Proparacaine 0.5%.   Procedure Preparation included 5% betadine to ocular surface, eyelid speculum. A (32g) needle was used.   Injection: 2 mg aflibercept 2 MG/0.05ML   Route: Intravitreal, Site: Right Eye   NDC: 28315-176-16, Lot: [id number], Expiration date: 07/09/2022, Waste: 0.05 mL   Post-op Post injection exam found visual acuity of at least counting fingers. The patient tolerated the procedure well. There were no complications. The patient received written and verbal post procedure care education. Post injection medications were not given.   Notes **SAMPLE MEDICATION ADMINISTERED**            ASSESSMENT/PLAN:   ICD-10-CM   1. Exudative age-related macular degeneration of right eye with active choroidal neovascularization (HCC)  H35.3211 OCT, Retina - OU - Both Eyes    Intravitreal Injection, Pharmacologic Agent - OD - Right Eye    aflibercept (EYLEA) SOLN 2 mg    2. Intermediate stage nonexudative age-related macular degeneration of left eye  H35.3122     3. Essential hypertension  I10     4. Hypertensive retinopathy of both eyes  H35.033     5. Pseudophakia, both eyes  Z96.1      1. Exudative age related macular degeneration, OD  - s/p IVA OD #1 (05.06.22), #2 (06.03.22), #3 (07.01.22), #4 (07.29.22), #5 (08.26.22), #6 (09.23.22), #7 (10.21.22), #8 (11.18.22), #9 (12.16.22), #10 (01.20.23)  - OCT shows Stable improvement in SRF overlying PED, interval increase in IRFand edema temporal fovea overlying central PED / SRHM at 5 weeks  - BCVA decreased to 20/40 from 20/30  - discussed IVA resistance and possible switch in medication  - recommend  IVE OD #1 (sample) today, 02.24.23 -- w/ f/u back to 4 wks - pt wishes to be treated with IVE - RBA of procedure discussed, questions answered - IVA informed consent obtained and signed, 05.06.22 (OD) - IVE informed consent obtained and signed, 02.24.23 (OD) - see procedure note  - f/u in 4  wks -- DFE/OCT, possible injection  2. Age related macular degeneration, non-exudative, OS  - The incidence, anatomy, and pathology of dry AMD, risk of progression, and the AREDS and AREDS 2 studies including smoking risks discussed with patient.   - Recommend amsler grid monitoring   3,4. Hypertensive retinopathy OU - discussed importance of tight BP control - monitor   5. Pseudophakia OU  - s/p CE/IOL  - IOL in good position, doing well  - monitor   Ophthalmic Meds Ordered this visit:  Meds ordered this encounter  Medications   aflibercept (EYLEA) SOLN 2 mg     Return in about 4 weeks (around 09/30/2021) for f/u exu ARMD OD, DFE, OCT.  There are no Patient Instructions on file for this visit.  This document serves as a record of services personally performed by Gardiner Sleeper, MD, PhD. It was created on their behalf by Leonie Douglas, an ophthalmic technician. The creation of this record is the provider's dictation and/or activities during the visit.    Electronically signed by: Leonie Douglas COA, 09/02/21  9:13 AM  This document serves as a record of services personally performed by Gardiner Sleeper, MD, PhD. It was created on their behalf by San Jetty. Owens Shark, OA an ophthalmic technician. The creation of this record is the provider's dictation and/or activities during the visit.    Electronically signed by: San Jetty. Owens Shark, [place] 02.24.2023 9:13 AM  Gardiner Sleeper, M.D., Ph.D. Diseases & Surgery of the Retina and Vitreous [facility]  I have reviewed the above documentation for accuracy and completeness, and I agree with the above. Gardiner Sleeper, M.D., Ph.D. 09/02/21 9:14 AM   Abbreviations: M myopia (nearsighted); A astigmatism; H hyperopia (farsighted); P presbyopia; Mrx spectacle prescription;  CTL contact lenses; OD right eye; OS left eye; OU both eyes  XT exotropia; ET esotropia; PEK punctate epithelial keratitis; PEE punctate epithelial erosions;  DES dry eye syndrome; MGD meibomian gland dysfunction; ATs artificial tears; PFAT's preservative free artificial tears; [place] nuclear sclerotic cataract; PSC posterior subcapsular cataract; ERM epi-retinal membrane; PVD posterior vitreous detachment; RD retinal detachment; DM diabetes mellitus; DR diabetic retinopathy; NPDR non-proliferative diabetic retinopathy; PDR proliferative diabetic retinopathy; CSME clinically significant macular edema; DME diabetic macular edema; dbh dot blot hemorrhages; CWS cotton wool spot; POAG primary open angle glaucoma; C/D cup-to-disc ratio; HVF humphrey visual field; GVF goldmann visual field; OCT optical coherence tomography; IOP intraocular pressure; BRVO Branch retinal vein occlusion; CRVO central retinal vein occlusion; CRAO central retinal artery occlusion; BRAO branch retinal artery occlusion; RT retinal tear; SB scleral buckle; PPV pars plana vitrectomy; VH Vitreous hemorrhage; PRP panretinal laser photocoagulation; IVK intravitreal kenalog; VMT vitreomacular traction; MH Macular hole;  NVD neovascularization of the disc; NVE neovascularization elsewhere; AREDS age related eye disease study; ARMD age related macular degeneration; POAG primary open angle glaucoma; EBMD epithelial/anterior basement membrane dystrophy; ACIOL anterior chamber intraocular lens; IOL intraocular lens; PCIOL posterior chamber intraocular lens; Phaco/IOL phacoemulsification with intraocular lens placement; [place] photorefractive keratectomy; LASIK laser assisted in situ keratomileusis; HTN hypertension; DM diabetes mellitus; COPD chronic obstructive pulmonary disease

## 2021-08-26 NOTE — Progress Notes (Addendum)
Annual wellness visit completed by Lanier Ensign LPN on 0/38/88

## 2021-08-30 ENCOUNTER — Ambulatory Visit (INDEPENDENT_AMBULATORY_CARE_PROVIDER_SITE_OTHER): Payer: Medicare PPO

## 2021-08-30 ENCOUNTER — Other Ambulatory Visit: Payer: Self-pay

## 2021-08-30 DIAGNOSIS — Z0000 Encounter for general adult medical examination without abnormal findings: Secondary | ICD-10-CM

## 2021-08-30 NOTE — Progress Notes (Signed)
Subjective:   Dana Sandoval is a 81 y.o. female who presents for Medicare Annual (Subsequent) preventive examination.  I connected with Ashanty Torbert today by telephone and verified that I am speaking with the correct person using two identifiers. Location patient: home Location provider: work Persons participating in the virtual visit: patient, Marine scientist.    I discussed the limitations, risks, security and privacy concerns of performing an evaluation and management service by telephone and the availability of in person appointments. I also discussed with the patient that there may be a patient responsible charge related to this service. The patient expressed understanding and verbally consented to this telephonic visit.    Interactive audio and video telecommunications were attempted between this provider and patient, however failed, due to patient having technical difficulties OR patient did not have access to video capability.  We continued and completed visit with audio only.  Some vital signs may be absent or patient reported.   Time Spent with patient on telephone encounter: 35 minutes  Review of Systems     Cardiac Risk Factors include: advanced age (>88men, >40 women);obesity (BMI >30kg/m2);dyslipidemia;diabetes mellitus     Objective:    There were no vitals filed for this visit. There is no height or weight on file to calculate BMI.  Advanced Directives 08/30/2021 02/05/2020 09/10/2018 09/02/2018  Does Patient Have a Medical Advance Directive? Yes Yes Yes Yes  Type of Advance Directive Living will [place];Living will [place];Living will [place];Living will  Does patient want to make changes to medical advance directive? - - No - Patient declined No - Patient declined  Copy of [place] in Chart? - No - copy requested No - copy requested -    Current Medications (verified) Outpatient Encounter  Medications as of 08/30/2021  Medication Sig   aspirin EC 81 MG tablet Take 1 tablet (81 mg total) by mouth every other day.   atorvastatin (LIPITOR) 40 MG tablet TAKE 1 TABLET BY MOUTH ONCE A DAY AT 6PM   glucose blood (ACCU-CHEK AVIVA PLUS) test strip Use as directed to check BS daily. Dx: E11.9   levothyroxine (SYNTHROID) 88 MCG tablet Take 1 tablet (88 mcg total) by mouth daily.   metFORMIN (GLUCOPHAGE) 500 MG tablet TAKE 1 TABLET BY MOUTH TWICE A DAY WITH MEALS   Multiple Vitamins-Minerals (PRESERVISION AREDS 2 PO) Take 1 tablet by mouth 2 (two) times daily.    ramipril (ALTACE) 10 MG capsule TAKE 1 CAPSULE BY MOUTH ONCE DAILY   valACYclovir (VALTREX) 500 MG tablet Take 500 mg by mouth daily. As needed   VITAMIN D PO Take 1,000 Units by mouth daily at 2 PM.   [DISCONTINUED] cyanocobalamin (,VITAMIN B-12,) 1000 MCG/ML injection INJECT 1,000 MCG (1 MLS) INTO THE MUSCLEONCE EVERY 30 DAYS (Patient not taking: Reported on 05/27/2021)   [DISCONTINUED] Needles & Syringes (1CC TB SYRINGE) MISC 1 Units by Does not apply route as needed.   No facility-administered encounter medications on file as of 08/30/2021.    Allergies (verified) Codeine and Dilaudid [hydromorphone hcl]   History: Past Medical History:  Diagnosis Date   Aortic stenosis    Arthritis    B12 deficiency    Diabetes mellitus    type 2   Heart murmur    aortic stenosis   Hyperlipidemia    Hypothyroidism    IBS (irritable bowel syndrome)    Macular degeneration    NSVD (normal spontaneous vaginal delivery)  X 4   Pain    INTRASCAPULAR PAIN   SAB (spontaneous abortion)    X 2   Thyroid disease    HYPOTHYROIDISM   Past Surgical History:  Procedure Laterality Date   ABDOMINAL HYSTERECTOMY  1977   TAH  (FIBROIDS)   CATARACT EXTRACTION Bilateral    DILATION AND CURETTAGE OF UTERUS     DOPPLER ECHOCARDIOGRAPHY  2009   HAD AN ECHOCARDIOGRAM THAT SHOWED MODERATE MITRAL  ANNULAR CALCIFICATION AND A NORMAL EJECTION  FRACTION OF 55%   EYE SURGERY Bilateral    Cat Sx   OTHER SURGICAL HISTORY     HYSTERECTOMY   SHOULDER SURGERY     RIGHT SHOULDER SURGERY   THYROIDECTOMY     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   RIGHT   TOTAL KNEE ARTHROPLASTY Left 09/10/2018   Procedure: TOTAL KNEE ARTHROPLASTY;  Surgeon: Melrose Nakayama, MD;  Location: [facility];  Service: Orthopedics;  Laterality: Left;   Family History  Problem Relation Age of Onset   Osteoporosis Mother    Heart disease Mother    Macular degeneration Mother    Heart disease Father    Cancer Father        BRAIN TUMOR   Diabetes Maternal Aunt    Diabetes Maternal Uncle    Cancer Brother        BRAIN TUMOR   Colon cancer Neg Hx    Breast cancer Neg Hx    Social History   Socioeconomic History   Marital status: Married    Spouse name: Not on file   Number of children: 4   Years of education: Not on file   Highest education level: Not on file  Occupational History   Not on file  Tobacco Use   Smoking status: Never   Smokeless tobacco: Never  Vaping Use   Vaping Use: Never used  Substance and Sexual Activity   Alcohol use: No    Alcohol/week: 0.0 standard drinks   Drug use: No   Sexual activity: Yes    Partners: Male    Birth control/protection: Surgical    Comment: 1st intercourse- 17, partners- 42, married- 74 yrs   Other Topics Concern   Not on file  Social History Narrative   Lives with husband, Pearson Forster- daughter, documents at home   Social Determinants of Health   Financial Resource Strain: Low Risk    Difficulty of Paying Living Expenses: Not hard at all  Food Insecurity: No Food Insecurity   Worried About Charity fundraiser in the Last Year: Never true   [place] in the Last Year: Never true  Transportation Needs: No Transportation Needs   Lack of Transportation (Medical): No   Lack of Transportation (Non-Medical): No  Physical Activity: Inactive   Days of Exercise per Week: 0 days   Minutes of  Exercise per Session: 0 min  Stress: No Stress Concern Present   Feeling of Stress : Not at all  Social Connections: Moderately Integrated   Frequency of Communication with Friends and Family: More than three times a week   Frequency of Social Gatherings with Friends and Family: More than three times a week   Attends Religious Services: 1 to 4 times per year   Active Member of [employer] or Organizations: No   Attends Archivist Meetings: Never   Marital Status: Married    Tobacco Counseling Counseling given: Not Answered   Clinical Intake:  Pre-visit preparation completed: Yes  Pain : No/denies pain     BMI - recorded: 32.81 Nutritional Status: BMI > 30  Obese Nutritional Risks: None Diabetes: Yes CBG done?: No Did pt. bring in CBG monitor from home?: No  How often do you need to have someone help you when you read instructions, pamphlets, or other written materials from your doctor or pharmacy?: 1 - Never  Diabetes:  Is the patient diabetic?  Yes  If diabetic, was a CBG obtained today?  No  Did the patient bring in their glucometer from home?  No  How often do you monitor your CBG's? 3 time a week.   Financial Strains and Diabetes Management:  Are you having any financial strains with the device, your supplies or your medication? No .  Does the patient want to be seen by Chronic Care Management for management of their diabetes?  No  Would the patient like to be referred to a Nutritionist or for Diabetic Management?  No   Diabetic Exams:  Diabetic Eye Exam:  Overdue for diabetic eye exam. Pt has been advised about the importance in completing this exam. A referral has been placed today. Message sent to referral coordinator for scheduling purposes. Advised pt to expect a call from our office re: appt.  Diabetic Foot Exam: Pt has been advised about the importance in completing this exam. Pt is scheduled for diabetic foot exam on next appt .    Interpreter  Needed?: No  Information entered by :: Charlott Rakes, LPN   Activities of Daily Living In your present state of health, do you have any difficulty performing the following activities: 08/30/2021  Vision? N  Difficulty concentrating or making decisions? N  Walking or climbing stairs? N  Dressing or bathing? N  Doing errands, shopping? N  Preparing Food and eating ? N  Using the Toilet? N  In the past six months, have you accidently leaked urine? N  Do you have problems with loss of bowel control? N  Managing your Medications? N  Managing your Finances? N  Housekeeping or managing your Housekeeping? N  Some recent data might be hidden    Patient Care Team: Tonia Ghent, MD as PCP - General (Family Medicine)  Indicate any recent Medical Services you may have received from other than [facility] providers in the past year (date may be approximate).     Assessment:   This is a routine wellness examination for Annia.  Hearing/Vision screen Hearing Screening - Comments:: Pt denies any hearing issues  Vision Screening - Comments:: Pt follows up with dr Carlisle Beers for annual eye exams   Dietary issues and exercise activities discussed: Current Exercise Habits: The patient does not participate in regular exercise at present   Goals Addressed             This Visit's Progress    Patient Stated       Stay  healthy       Depression Screen PHQ 2/9 Scores 08/30/2021 07/20/2020 02/05/2020 01/29/2019 03/20/2018  PHQ - 2 Score 0 0 0 0 0  PHQ- 9 Score - - 0 - -    Fall Risk Fall Risk  08/30/2021 07/20/2020 02/05/2020 01/29/2019 03/20/2018  Falls in the past year? 0 0 0 0 No  Number falls in past yr: 0 0 0 - -  Injury with Fall? 0 0 0 - -  Risk for fall due to : Impaired vision - Medication side effect - -  Follow up Falls prevention discussed  Falls evaluation completed Falls evaluation completed;Falls prevention discussed - -    FALL RISK PREVENTION PERTAINING TO THE HOME:  Any  stairs in or around the home? Yes  If so, are there any without handrails? No  Home free of loose throw rugs in walkways, pet beds, electrical cords, etc? Yes  Adequate lighting in your home to reduce risk of falls? Yes   ASSISTIVE DEVICES UTILIZED TO PREVENT FALLS:  Life alert? No  Use of a cane, walker or w/c? No  Grab bars in the bathroom? Yes  Shower chair or bench in shower? Yes  Elevated toilet seat or a handicapped toilet? No   TIMED UP AND GO:  Was the test performed? No .    Cognitive Function: MMSE - Mini Mental State Exam 02/05/2020  Orientation to time 5  Orientation to Place 5  Registration 3  Attention/ Calculation 5  Recall 3  Language- repeat 1     6CIT Screen 08/30/2021  What Year? 0 points  What month? 0 points  What time? 0 points  Count back from 20 0 points  Months in reverse 0 points  Repeat phrase 0 points  Total Score 0    Immunizations Immunization History  Administered Date(s) Administered   Fluad Quad(high Dose 65+) 05/07/2019, 06/03/2019   Influenza, High Dose Seasonal PF 03/20/2018   Moderna Sars-Covid-2 Vaccination 10/07/2019, 11/04/2019, 06/30/2020   Pneumococcal Conjugate-13 04/22/2017   Pneumococcal Polysaccharide-23 04/10/2008, 04/22/2018   Tdap 07/09/2012    TDAP status: Up to date  Flu Vaccine status: Up to date  Pneumococcal vaccine status: Up to date  Covid-19 vaccine status: Completed vaccines  Qualifies for Shingles Vaccine? Yes   Zostavax completed No   Shingrix Completed?: No.    Education has been provided regarding the importance of this vaccine. Patient has been advised to call insurance company to determine out of pocket expense if they have not yet received this vaccine. Advised may also receive vaccine at local pharmacy or Health Dept. Verbalized acceptance and understanding.  Screening Tests Health Maintenance  Topic Date Due   Zoster Vaccines- Shingrix (1 of 2) Never done   COVID-19 Vaccine (4 - Booster  for Moderna series) 08/25/2020   INFLUENZA VACCINE  02/07/2021   FOOT EXAM  03/22/2021   HEMOGLOBIN A1C  05/21/2021   OPHTHALMOLOGY EXAM  06/02/2021   MAMMOGRAM  06/16/2022   TETANUS/TDAP  07/09/2022   Pneumonia Vaccine 32+ Years old  Completed   DEXA SCAN  Completed   HPV VACCINES  Aged Out   PAP SMEAR-Modifier  Discontinued    Health Maintenance  Health Maintenance Due  Topic Date Due   Zoster Vaccines- Shingrix (1 of 2) Never done   COVID-19 Vaccine (4 - Booster for Moderna series) 08/25/2020   INFLUENZA VACCINE  02/07/2021   FOOT EXAM  03/22/2021   HEMOGLOBIN A1C  05/21/2021   OPHTHALMOLOGY EXAM  06/02/2021    Colorectal cancer screening: No longer required.   Mammogram status: Completed 06/16/21. Repeat every year  Bone Density status: Completed 06/16/21. Results reflect: Bone density results: OSTEOPENIA. Repeat every 2 years.    Additional Screening:  Hepatitis C Screening: does not qualify  Vision Screening: Recommended annual ophthalmology exams for early detection of glaucoma and other disorders of the eye. Is the patient up to date with their annual eye exam?  Yes  Who is the provider or what is the name of the office in which the patient attends annual eye exams? Dr Carlisle Beers If pt  is not established with a provider, would they like to be referred to a provider to establish care? No .   Dental Screening: Recommended annual dental exams for proper oral hygiene  Community Resource Referral / Chronic Care Management: CRR required this visit?  No   CCM required this visit?  No      Plan:     I have personally reviewed and noted the following in the patients chart:   Medical and social history Use of alcohol, tobacco or illicit drugs  Current medications and supplements including opioid prescriptions.  Functional ability and status Nutritional status Physical activity Advanced directives List of other physicians Hospitalizations, surgeries, and ER  visits in previous 12 months Vitals Screenings to include cognitive, depression, and falls Referrals and appointments  In addition, I have reviewed and discussed with patient certain preventive protocols, quality metrics, and best practice recommendations. A written personalized care plan for preventive services as well as general preventive health recommendations were provided to patient.   Due to this being a telephonic visit, the after visit summary with patients personalized plan was offered to patient via mail or my-chart. Mail    Willette Brace, LPN   [phone]   Nurse Health Advisor  Nurse Notes: none

## 2021-08-30 NOTE — Patient Instructions (Addendum)
Dana Sandoval , Thank you for taking time to come for your Medicare Wellness Visit. I appreciate your ongoing commitment to your health goals. Please review the following plan we discussed and let me know if I can assist you in the future.   Screening recommendations/referrals: Colonoscopy: No longer required  Mammogram: Done 06/16/21 repeat every year Bone Density: Done 06/16/21 repeat every 2 year s Recommended yearly ophthalmology/optometry visit for glaucoma screening and checkup Recommended yearly dental visit for hygiene and checkup  Vaccinations: Influenza vaccine: Done pt stated completed in November Pneumococcal vaccine: Up to date Tdap vaccine: Done 07/09/12  repeat every 10 years  Shingles vaccine: Shingrix discussed. Please contact your pharmacy for coverage information.    Covid-19:Completed 3/30, 4/27, & 06/30/20  Advanced directives: Please bring a copy of your health care power of attorney and living will to the office at your convenience.  Conditions/risks identified: None at this time  Next appointment: Follow up in one year for your annual wellness visit    Preventive Care 65 Years and Older, Female Preventive care refers to lifestyle choices and visits with your health care provider that can promote health and wellness. What does preventive care include? A yearly physical exam. This is also called an annual well check. Dental exams once or twice a year. Routine eye exams. Ask your health care provider how often you should have your eyes checked. Personal lifestyle choices, including: Daily care of your teeth and gums. Regular physical activity. Eating a healthy diet. Avoiding tobacco and drug use. Limiting alcohol use. Practicing safe sex. Taking low-dose aspirin every day. Taking vitamin and mineral supplements as recommended by your health care provider. What happens during an annual well check? The services and screenings done by your health care provider  during your annual well check will depend on your age, overall health, lifestyle risk factors, and family history of disease. Counseling  Your health care provider may ask you questions about your: Alcohol use. Tobacco use. Drug use. Emotional well-being. Home and relationship well-being. Sexual activity. Eating habits. History of falls. Memory and ability to understand (cognition). Work and work Astronomer. Reproductive health. Screening  You may have the following tests or measurements: Height, weight, and BMI. Blood pressure. Lipid and cholesterol levels. These may be checked every 5 years, or more frequently if you are over 26 years old. Skin check. Lung cancer screening. You may have this screening every year starting at age 85 if you have a 30-pack-year history of smoking and currently smoke or have quit within the past 15 years. Fecal occult blood test (FOBT) of the stool. You may have this test every year starting at age 57. Flexible sigmoidoscopy or colonoscopy. You may have a sigmoidoscopy every 5 years or a colonoscopy every 10 years starting at age 65. Hepatitis C blood test. Hepatitis B blood test. Sexually transmitted disease (STD) testing. Diabetes screening. This is done by checking your blood sugar (glucose) after you have not eaten for a while (fasting). You may have this done every 1-3 years. Bone density scan. This is done to screen for osteoporosis. You may have this done starting at age 25. Mammogram. This may be done every 1-2 years. Talk to your health care provider about how often you should have regular mammograms. Talk with your health care provider about your test results, treatment options, and if necessary, the need for more tests. Vaccines  Your health care provider may recommend certain vaccines, such as: Influenza vaccine. This is recommended every  year. Tetanus, diphtheria, and acellular pertussis (Tdap, Td) vaccine. You may need a Td booster every  10 years. Zoster vaccine. You may need this after age 75. Pneumococcal 13-valent conjugate (PCV13) vaccine. One dose is recommended after age 31. Pneumococcal polysaccharide (PPSV23) vaccine. One dose is recommended after age 3. Talk to your health care provider about which screenings and vaccines you need and how often you need them. This information is not intended to replace advice given to you by your health care provider. Make sure you discuss any questions you have with your health care provider. Document Released: 07/23/2015 Document Revised: 03/15/2016 Document Reviewed: 04/27/2015 Elsevier Interactive Patient Education  2017 [employer] Prevention in the Home Falls can cause injuries. They can happen to people of all ages. There are many things you can do to make your home safe and to help prevent falls. What can I do on the outside of my home? Regularly fix the edges of walkways and driveways and fix any cracks. Remove anything that might make you trip as you walk through a door, such as a raised step or threshold. Trim any bushes or trees on the path to your home. Use bright outdoor lighting. Clear any walking paths of anything that might make someone trip, such as rocks or tools. Regularly check to see if handrails are loose or broken. Make sure that both sides of any steps have handrails. Any raised decks and porches should have guardrails on the edges. Have any leaves, snow, or ice cleared regularly. Use sand or salt on walking paths during winter. Clean up any spills in your garage right away. This includes oil or grease spills. What can I do in the bathroom? Use night lights. Install grab bars by the toilet and in the tub and shower. Do not use towel bars as grab bars. Use non-skid mats or decals in the tub or shower. If you need to sit down in the shower, use a plastic, non-slip stool. Keep the floor dry. Clean up any water that spills on the floor as soon as it  happens. Remove soap buildup in the tub or shower regularly. Attach bath mats securely with double-sided non-slip rug tape. Do not have throw rugs and other things on the floor that can make you trip. What can I do in the bedroom? Use night lights. Make sure that you have a light by your bed that is easy to reach. Do not use any sheets or blankets that are too big for your bed. They should not hang down onto the floor. Have a firm chair that has side arms. You can use this for support while you get dressed. Do not have throw rugs and other things on the floor that can make you trip. What can I do in the kitchen? Clean up any spills right away. Avoid walking on wet floors. Keep items that you use a lot in easy-to-reach places. If you need to reach something above you, use a strong step stool that has a grab bar. Keep electrical cords out of the way. Do not use floor polish or wax that makes floors slippery. If you must use wax, use non-skid floor wax. Do not have throw rugs and other things on the floor that can make you trip. What can I do with my stairs? Do not leave any items on the stairs. Make sure that there are handrails on both sides of the stairs and use them. Fix handrails that are broken or  loose. Make sure that handrails are as long as the stairways. Check any carpeting to make sure that it is firmly attached to the stairs. Fix any carpet that is loose or worn. Avoid having throw rugs at the top or bottom of the stairs. If you do have throw rugs, attach them to the floor with carpet tape. Make sure that you have a light switch at the top of the stairs and the bottom of the stairs. If you do not have them, ask someone to add them for you. What else can I do to help prevent falls? Wear shoes that: Do not have high heels. Have rubber bottoms. Are comfortable and fit you well. Are closed at the toe. Do not wear sandals. If you use a stepladder: Make sure that it is fully opened.  Do not climb a closed stepladder. Make sure that both sides of the stepladder are locked into place. Ask someone to hold it for you, if possible. Clearly mark and make sure that you can see: Any grab bars or handrails. First and last steps. Where the edge of each step is. Use tools that help you move around (mobility aids) if they are needed. These include: Canes. Walkers. Scooters. Crutches. Turn on the lights when you go into a dark area. Replace any light bulbs as soon as they burn out. Set up your furniture so you have a clear path. Avoid moving your furniture around. If any of your floors are uneven, fix them. If there are any pets around you, be aware of where they are. Review your medicines with your doctor. Some medicines can make you feel dizzy. This can increase your chance of falling. Ask your doctor what other things that you can do to help prevent falls. This information is not intended to replace advice given to you by your health care provider. Make sure you discuss any questions you have with your health care provider. Document Released: 04/22/2009 Document Revised: 12/02/2015 Document Reviewed: 07/31/2014 Elsevier Interactive Patient Education  2017 [employer].

## 2021-09-02 ENCOUNTER — Encounter (INDEPENDENT_AMBULATORY_CARE_PROVIDER_SITE_OTHER): Payer: Self-pay | Admitting: Ophthalmology

## 2021-09-02 ENCOUNTER — Ambulatory Visit (INDEPENDENT_AMBULATORY_CARE_PROVIDER_SITE_OTHER): Payer: Medicare PPO | Admitting: Ophthalmology

## 2021-09-02 ENCOUNTER — Other Ambulatory Visit: Payer: Self-pay

## 2021-09-02 DIAGNOSIS — H353122 Nonexudative age-related macular degeneration, left eye, intermediate dry stage: Secondary | ICD-10-CM | POA: Diagnosis not present

## 2021-09-02 DIAGNOSIS — H353211 Exudative age-related macular degeneration, right eye, with active choroidal neovascularization: Secondary | ICD-10-CM

## 2021-09-02 DIAGNOSIS — I10 Essential (primary) hypertension: Secondary | ICD-10-CM | POA: Diagnosis not present

## 2021-09-02 DIAGNOSIS — H35033 Hypertensive retinopathy, bilateral: Secondary | ICD-10-CM | POA: Diagnosis not present

## 2021-09-02 DIAGNOSIS — Z961 Presence of intraocular lens: Secondary | ICD-10-CM

## 2021-09-02 MED ORDER — AFLIBERCEPT 2MG/0.05ML IZ SOLN FOR KALEIDOSCOPE
2.0000 mg | INTRAVITREAL | Status: AC | PRN
  Administered 2021-09-02: 2 mg via INTRAVITREAL

## 2021-09-06 ENCOUNTER — Telehealth: Payer: Self-pay | Admitting: Family

## 2021-09-06 DIAGNOSIS — E119 Type 2 diabetes mellitus without complications: Secondary | ICD-10-CM

## 2021-09-07 MED ORDER — METFORMIN HCL 500 MG PO TABS: 500.0000 mg | ORAL_TABLET | Freq: Two times a day (BID) | ORAL | 3 refills | Status: DC

## 2021-09-07 NOTE — Addendum Note (Signed)
Addended by: Joaquim Nam on: 09/07/2021 10:39 AM ? ? Modules accepted: Orders ? ?

## 2021-09-07 NOTE — Telephone Encounter (Signed)
Metformin RX sent. Thanks.  ? ?

## 2021-09-08 ENCOUNTER — Other Ambulatory Visit: Payer: Self-pay

## 2021-09-08 ENCOUNTER — Other Ambulatory Visit (INDEPENDENT_AMBULATORY_CARE_PROVIDER_SITE_OTHER): Payer: Medicare PPO

## 2021-09-08 DIAGNOSIS — E538 Deficiency of other specified B group vitamins: Secondary | ICD-10-CM | POA: Diagnosis not present

## 2021-09-08 DIAGNOSIS — E119 Type 2 diabetes mellitus without complications: Secondary | ICD-10-CM | POA: Diagnosis not present

## 2021-09-08 DIAGNOSIS — M8580 Other specified disorders of bone density and structure, unspecified site: Secondary | ICD-10-CM | POA: Diagnosis not present

## 2021-09-08 LAB — LIPID PANEL
Cholesterol: 130 mg/dL (ref 0–200)
HDL: 36.5 mg/dL — ABNORMAL LOW (ref >39.00)
LDL Cholesterol: 75 mg/dL (ref 0–99)
NonHDL: 93.19
Total CHOL/HDL Ratio: 4
Triglycerides: 91 mg/dL (ref 0.0–149.0)
VLDL: 18.2 mg/dL (ref 0.0–40.0)

## 2021-09-08 LAB — COMPREHENSIVE METABOLIC PANEL
ALT: 10 U/L (ref 0–35)
AST: 15 U/L (ref 0–37)
Albumin: 3.9 g/dL (ref 3.5–5.2)
Alkaline Phosphatase: 67 U/L (ref 39–117)
BUN: 17 mg/dL (ref 6–23)
CO2: 29 mEq/L (ref 19–32)
Calcium: 8.9 mg/dL (ref 8.4–10.5)
Chloride: 104 mEq/L (ref 96–112)
Creatinine, Ser: 0.88 mg/dL (ref 0.40–1.20)
GFR: 61.23 mL/min (ref >60.00)
Glucose, Bld: 160 mg/dL — ABNORMAL HIGH (ref 70–99)
Potassium: 3.9 mEq/L (ref 3.5–5.1)
Sodium: 141 mEq/L (ref 135–145)
Total Bilirubin: 0.5 mg/dL (ref 0.2–1.2)
Total Protein: 6.4 g/dL (ref 6.0–8.3)

## 2021-09-08 LAB — VITAMIN D 25 HYDROXY (VIT D DEFICIENCY, FRACTURES): VITD: 46.36 ng/mL (ref 30.00–100.00)

## 2021-09-08 LAB — TSH: TSH: 1.24 u[IU]/mL (ref 0.35–5.50)

## 2021-09-08 LAB — HEMOGLOBIN A1C: Hgb A1c MFr Bld: 6.2 % (ref 4.6–6.5)

## 2021-09-08 LAB — VITAMIN B12: Vitamin B-12: 266 pg/mL (ref 211–911)

## 2021-09-15 ENCOUNTER — Encounter: Payer: Self-pay | Admitting: Family Medicine

## 2021-09-15 ENCOUNTER — Ambulatory Visit (INDEPENDENT_AMBULATORY_CARE_PROVIDER_SITE_OTHER): Payer: Medicare PPO | Admitting: Family Medicine

## 2021-09-15 ENCOUNTER — Other Ambulatory Visit: Payer: Self-pay

## 2021-09-15 VITALS — BP 124/78 | HR 87 | Temp 97.7°F | Ht 62.0 in | Wt 171.0 lb

## 2021-09-15 DIAGNOSIS — E119 Type 2 diabetes mellitus without complications: Secondary | ICD-10-CM

## 2021-09-15 DIAGNOSIS — E538 Deficiency of other specified B group vitamins: Secondary | ICD-10-CM | POA: Diagnosis not present

## 2021-09-15 DIAGNOSIS — H3530 Unspecified macular degeneration: Secondary | ICD-10-CM

## 2021-09-15 DIAGNOSIS — I10 Essential (primary) hypertension: Secondary | ICD-10-CM

## 2021-09-15 DIAGNOSIS — Z0000 Encounter for general adult medical examination without abnormal findings: Secondary | ICD-10-CM

## 2021-09-15 DIAGNOSIS — Z7189 Other specified counseling: Secondary | ICD-10-CM

## 2021-09-15 DIAGNOSIS — I350 Nonrheumatic aortic (valve) stenosis: Secondary | ICD-10-CM

## 2021-09-15 DIAGNOSIS — E785 Hyperlipidemia, unspecified: Secondary | ICD-10-CM

## 2021-09-15 DIAGNOSIS — E039 Hypothyroidism, unspecified: Secondary | ICD-10-CM | POA: Diagnosis not present

## 2021-09-15 MED ORDER — VITAMIN B-12 1000 MCG PO TABS: 1000.0000 ug | ORAL_TABLET | Freq: Every day | ORAL | Status: DC

## 2021-09-15 MED ORDER — LEVOTHYROXINE SODIUM 88 MCG PO TABS: 88.0000 ug | ORAL_TABLET | Freq: Every day | ORAL | 3 refills | Status: DC

## 2021-09-15 NOTE — Progress Notes (Signed)
This visit occurred during the SARS-CoV-2 public health emergency.  Safety protocols were in place, including screening questions prior to the visit, additional usage of staff PPE, and extensive cleaning of exam room while observing appropriate contact time as indicated for disinfecting solutions. ? ?Diabetes:  ?Using medications without difficulties: yes ?Hypoglycemic episodes: no ?Hyperglycemic episodes: no ?Feet problems: no ?Blood Sugars averaging: 100-120, higher if off diet (up to 140) ?eye exam within last year: yes ?Labs d/w pt.   ? ?Hypertension:   ?Using medication without problems or lightheadedness:  yes.  She has occ vertigo but not lightheaded.   ?Chest pain with exertion:no ?Edema:no ?Short of breath:no ? ?Elevated Cholesterol: ?Using medications without problems: yes ?Muscle aches: no ?Diet compliance:d/w pt.   ?Exercise: d/w pt.   ? ?H/o B12 def.  B12 still normal.  Prev on IM replacement.  D/w pt about trial of oral replacement.  We can recheck labs later on. ? ?Macular degeneration per eye clinic.  I will defer.  She agrees. ? ?Hypothyroidism.  Compliant with replacement.  Labs d/w pt.  Tsh wnl.  D/w pt at OV.  ? ?Husband designated if patient were incapacitated.   ?Vaccine d/w pt.  Shingles d/w pt.   ?Colon and pap not due.   ?DXA and mammogram 2022.   ? ?H/o AS followed by cardiology.   ? ?PMH and SH reviewed.  ? ?Vital signs, Meds and allergies reviewed. ? ?ROS: Per HPI unless specifically indicated in ROS section  ? ?GEN: nad, alert and oriented ?HEENT: ncat ?NECK: supple w/o LA ?CV: rrr.  SEM noted.   ?PULM: ctab, no inc wob ?ABD: soft, +bs ?EXT: no edema ?SKIN: Well-perfused. ? ?Diabetic foot exam: ?Normal inspection ?No skin breakdown ?No calluses  ?Normal DP pulses ?Normal sensation to light tough and monofilament ?Nails normal ? ?

## 2021-09-15 NOTE — Patient Instructions (Addendum)
Try taking 1022mcg B12 by mouth.  Recheck labs in about 4 months.  ?Take care.  Glad to see you. ?Thanks for your effort.   ?

## 2021-09-18 DIAGNOSIS — I10 Essential (primary) hypertension: Secondary | ICD-10-CM | POA: Insufficient documentation

## 2021-09-18 DIAGNOSIS — H3530 Unspecified macular degeneration: Secondary | ICD-10-CM | POA: Insufficient documentation

## 2021-09-18 DIAGNOSIS — Z0000 Encounter for general adult medical examination without abnormal findings: Secondary | ICD-10-CM | POA: Insufficient documentation

## 2021-09-18 NOTE — Assessment & Plan Note (Signed)
Continue metformin.  Recheck periodically.  Labs discussed with patient.  Continue work on diet and exercise. ?

## 2021-09-18 NOTE — Assessment & Plan Note (Signed)
Continue ramipril.  Blood pressures controlled. ?

## 2021-09-18 NOTE — Assessment & Plan Note (Signed)
Per cardiology.  I will defer. 

## 2021-09-18 NOTE — Assessment & Plan Note (Signed)
Continue Lipitor.  Continue work on diet and exercise. 

## 2021-09-18 NOTE — Assessment & Plan Note (Signed)
TSH normal.  Continue as is.  Labs discussed with patient. ?

## 2021-09-18 NOTE — Assessment & Plan Note (Signed)
Husband designated if patient were incapacitated.   ?Vaccine d/w pt.  Shingles d/w pt.   ?Colon and pap not due.   ?DXA and mammogram 2022.   ?

## 2021-09-18 NOTE — Assessment & Plan Note (Signed)
?  H/o B12 def.  B12 still normal.  Prev on IM replacement.  D/w pt about trial of oral replacement.  We can recheck labs later on.  See after visit summary. ?

## 2021-09-18 NOTE — Assessment & Plan Note (Signed)
Husband designated if patient were incapacitated. 

## 2021-09-18 NOTE — Assessment & Plan Note (Signed)
Per eye clinic.  I will defer.  She agrees to plan. ?

## 2021-09-22 NOTE — Progress Notes (Signed)
?[facility] Note ? ?09/30/2021 ? ?  ? ?CHIEF COMPLAINT ?Patient presents for Retina Follow Up ? ?HISTORY OF PRESENT ILLNESS: ?Dana Sandoval is a 81 y.o. female who presents to the clinic today for:  ? ?HPI   ? ? 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  Duration of 4 weeks.  I, the attending physician,  performed the HPI with the patient and updated documentation appropriately. ? ?  ?  ? ? Comments   ?4 week follow up Exu ARMD OD- Tuesday, she had a jagged floater that moved around, then went away.  After about 10 mins she went to read her mail she could not read it.  The letters wasn't making sense to her.  Spoke with daughter, daughter said she wasn't speaking well.  She called her PCP and was told to go to the hospital.  She had EEG, MRI x2, and carotid dopplers. Going to start Plavix and Aspirin.   ?BS 122 yesterday afternoon and 86 yest. morning ? ?  ?  ?Last edited by Bernarda Caffey, MD on 09/30/2021 10:09 PM.  ?  ?Pt states on Tuesday, she saw a new floater in her vision, she states it was jagged and only lasted a few minutes, she states after she saw the floaters, she was trying to read her mail and was unable to read it, she states her daughter called her and while on the phone with her, the daughter was unable to understand what she was trying to say, her daughter came by later and stayed around for an hour or so and then called the pts other daughter who called 911 bc she thought the pt was having a stroke, EMS came, but pt refused to go with them, pt saw a neurologist who said she has a TIA, she was released from the hospital last night and was put on Plavix ? ?Referring physician: ?Tonia Ghent, MD ?[address] ?[place],   [id number] ? ?HISTORICAL INFORMATION:  ?Selected notes from the [facility] ?Referred by Dr. Zenia Resides for eval of ret heme OD  ? ?CURRENT MEDICATIONS: ?Current Outpatient Medications (Ophthalmic Drugs)   ?Medication Sig  ? Aflibercept 2 MG/0.05ML SOLN 2 mg by Intravitreal route every 30 (thirty) days.  ? ?No current facility-administered medications for this visit. (Ophthalmic Drugs)  ? ?Current Outpatient Medications (Other)  ?Medication Sig  ? aspirin EC 81 MG tablet Take 1 tablet (81 mg total) by mouth every other day.  ? atorvastatin (LIPITOR) 40 MG tablet TAKE 1 TABLET BY MOUTH ONCE A DAY AT 6PM (Patient taking differently: Take 40 mg by mouth daily.)  ? levothyroxine (SYNTHROID) 88 MCG tablet Take 1 tablet (88 mcg total) by mouth daily.  ? metFORMIN (GLUCOPHAGE) 500 MG tablet Take 1 tablet (500 mg total) by mouth 2 (two) times daily with a meal.  ? Multiple Vitamins-Minerals (PRESERVISION AREDS 2 PO) Take 1 tablet by mouth 2 (two) times daily.   ? Neomycin-Bacitracin-Polymyxin (TRIPLE ANTIBIOTIC EX) Apply 1 application. topically daily as needed (pain).  ? ramipril (ALTACE) 10 MG capsule TAKE 1 CAPSULE BY MOUTH ONCE DAILY (Patient taking differently: Take 10 mg by mouth daily.)  ? valACYclovir (VALTREX) 500 MG tablet Take 500 mg by mouth daily as needed (fever blister).  ? vitamin B-12 (CYANOCOBALAMIN) 1000 MCG tablet Take 1 tablet (1,000 mcg total) by mouth daily.  ? VITAMIN D PO Take  1,000 Units by mouth daily.  ? clopidogrel (PLAVIX) 75 MG tablet Take 1 tablet (75 mg total) by mouth daily for 21 days.  ? glucose blood (ACCU-CHEK AVIVA PLUS) test strip Use as directed to check BS daily. Dx: E11.9  ? ?No current facility-administered medications for this visit. (Other)  ? ?REVIEW OF SYSTEMS: ?ROS   ?Positive for: Endocrine, Cardiovascular, Eyes ?Negative for: Constitutional, Gastrointestinal, Neurological, Skin, Genitourinary, Musculoskeletal, HENT, Respiratory, Psychiatric, Allergic/Imm, Heme/Lymph ?Last edited by Leonie Douglas, [place] on 09/30/2021  8:55 AM.  ?  ? ? ?ALLERGIES ?Allergies  ?Allergen Reactions  ? Codeine Itching  ?  After 3 days  ? Dilaudid [Hydromorphone Hcl] Itching  ?  After 3 days  ? ?PAST  MEDICAL HISTORY ?Past Medical History:  ?Diagnosis Date  ? Aortic stenosis   ? Arthritis   ? B12 deficiency   ? Diabetes mellitus   ? type 2  ? Heart murmur   ? aortic stenosis  ? Hyperlipidemia   ? Hypothyroidism   ? IBS (irritable bowel syndrome)   ? Macular degeneration   ? NSVD (normal spontaneous vaginal delivery)   ? X 4  ? Pain   ? INTRASCAPULAR PAIN  ? SAB (spontaneous abortion)   ? X 2  ? Thyroid disease   ? HYPOTHYROIDISM  ? ?Past Surgical History:  ?Procedure Laterality Date  ? ABDOMINAL HYSTERECTOMY  1977  ? TAH  (FIBROIDS)  ? CATARACT EXTRACTION Bilateral   ? DILATION AND CURETTAGE OF UTERUS    ? DOPPLER ECHOCARDIOGRAPHY  2009  ? HAD AN ECHOCARDIOGRAM THAT SHOWED MODERATE MITRAL  ANNULAR CALCIFICATION AND A NORMAL EJECTION FRACTION OF 55%  ? EYE SURGERY Bilateral   ? Cat Sx  ? OTHER SURGICAL HISTORY    ? HYSTERECTOMY  ? SHOULDER SURGERY    ? RIGHT SHOULDER SURGERY  ? THYROIDECTOMY    ? 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  ? RIGHT  ? TOTAL KNEE ARTHROPLASTY Left 09/10/2018  ? Procedure: TOTAL KNEE ARTHROPLASTY;  Surgeon: [facility] Nakayama, MD;  Location: [facility];  Service: Orthopedics;  Laterality: Left;  ? ?FAMILY HISTORY ?Family History  ?Problem Relation Age of Onset  ? Osteoporosis Mother   ? Heart disease Mother   ? Macular degeneration Mother   ? Heart disease Father   ? Cancer Father   ?     BRAIN TUMOR  ? Diabetes Maternal Aunt   ? Diabetes Maternal Uncle   ? Cancer Brother   ?     BRAIN TUMOR  ? Colon cancer Neg Hx   ? Breast cancer Neg Hx   ? ?SOCIAL HISTORY ?Social History  ? ?Tobacco Use  ? Smoking status: Never  ? Smokeless tobacco: Never  ?Vaping Use  ? Vaping Use: Never used  ?Substance Use Topics  ? Alcohol use: No  ?  Alcohol/week: 0.0 standard drinks  ? Drug use: No  ?  ? ?  ?OPHTHALMIC EXAM: ?Base Eye Exam   ? ? Visual Acuity (Snellen - Linear)   ? ?   Right Left  ? Dist [place] 20/40 -2 20/30 -2  ? Dist ph [place] 20/30 -2 NI  ? ?  ?  ? ? Tonometry (Tonopen, 9:04 AM)   ? ?   Right Left  ? Pressure  17 19  ? ?  ?  ? ? Pupils   ? ?   Dark Light Shape React APD  ? Right 3 2 Round Brisk None  ? Left 3 2 Round Brisk None  ? ?  ?  ? ?  Visual Fields (Counting fingers)   ? ?   Left Right  ?  Full Full  ? ?  ?  ? ? Extraocular Movement   ? ?   Right Left  ?  Full Full  ? ?  ?  ? ? Neuro/Psych   ? ? Oriented x3: Yes  ? Mood/Affect: Normal  ? ?  ?  ? ? Dilation   ? ? Both eyes: 1.0% Mydriacyl, 2.5% Phenylephrine @ 9:04 AM  ? ?  ?  ? ?  ? ?Slit Lamp and Fundus Exam   ? ? Slit Lamp Exam   ? ?   Right Left  ? Lids/Lashes Dermatochalasis - upper lid Dermatochalasis - upper lid  ? Conjunctiva/Sclera White and quiet White and quiet  ? Cornea 1+ Punctate epithelial erosions, fine endo pigment 1-2+ fine Punctate epithelial erosions, fine endo pigment  ? Anterior Chamber Deep and quiet Deep and quiet  ? Iris Round and dilated Round and dilated  ? Lens Toric PC IOL with marks at 1200 and 0600, 1+ Posterior capsular opacification PC IOL in good position, 1-2+ Posterior capsular opacification  ? Anterior Vitreous Vitreous syneresis, trace pigment, Posterior vitreous detachment, vitreous condensations Vitreous syneresis, Posterior vitreous detachment, vitreous condensations  ? ?  ?  ? ? Fundus Exam   ? ?   Right Left  ? Disc mild Pallor, Sharp rim, +cupping, PPA mild Pallor, Sharp rim, +cupping, PPA  ? C/D Ratio 0.6 0.6  ? Macula Blunted foveal reflex, central CNV with subretinal heme-- stably resolved -- now with pigment ring, RPE mottling, clumping and atrophy, Drusen, shallow SRF overlying CNV -- stably improved, cystic changes temporal mac - improved Flat, Blunted foveal reflex, Drusen, RPE mottling, clumping and atrophy, No heme or edema  ? Vessels attenuated, mild tortuousity attenuated, mild tortuousity  ? Periphery Attached, reticular degeneration, paving stone degeneration IT quad, No heme Attached, reticular degeneration, mild inferior paving stone degeneration  ? ?  ?  ? ?  ? ?IMAGING AND PROCEDURES  ?Imaging and  Procedures for 09/30/2021 ? ?OCT, Retina - OU - Both Eyes   ? ?   ?Right Eye ?Quality was good. Central Foveal Thickness: 312. Progression has improved. Findings include abnormal foveal contour, subretinal hyper-re

## 2021-09-27 ENCOUNTER — Encounter: Payer: Self-pay | Admitting: Family Medicine

## 2021-09-28 ENCOUNTER — Encounter (HOSPITAL_COMMUNITY): Payer: Self-pay

## 2021-09-28 ENCOUNTER — Observation Stay (HOSPITAL_COMMUNITY): Payer: Medicare PPO

## 2021-09-28 ENCOUNTER — Emergency Department (HOSPITAL_COMMUNITY): Payer: Medicare PPO

## 2021-09-28 ENCOUNTER — Other Ambulatory Visit: Payer: Self-pay

## 2021-09-28 ENCOUNTER — Observation Stay (HOSPITAL_COMMUNITY)
Admission: EM | Admit: 2021-09-28 | Discharge: 2021-09-29 | Disposition: A | Discharge status: Home / Self Care | Payer: Medicare PPO | Attending: Family Medicine | Admitting: Family Medicine

## 2021-09-28 DIAGNOSIS — E119 Type 2 diabetes mellitus without complications: Secondary | ICD-10-CM | POA: Diagnosis not present

## 2021-09-28 DIAGNOSIS — E538 Deficiency of other specified B group vitamins: Secondary | ICD-10-CM | POA: Diagnosis not present

## 2021-09-28 DIAGNOSIS — Z7982 Long term (current) use of aspirin: Secondary | ICD-10-CM | POA: Diagnosis not present

## 2021-09-28 DIAGNOSIS — Z96653 Presence of artificial knee joint, bilateral: Secondary | ICD-10-CM | POA: Insufficient documentation

## 2021-09-28 DIAGNOSIS — Z79899 Other long term (current) drug therapy: Secondary | ICD-10-CM | POA: Diagnosis not present

## 2021-09-28 DIAGNOSIS — I10 Essential (primary) hypertension: Secondary | ICD-10-CM | POA: Diagnosis present

## 2021-09-28 DIAGNOSIS — S0990XA Unspecified injury of head, initial encounter: Secondary | ICD-10-CM | POA: Diagnosis not present

## 2021-09-28 DIAGNOSIS — Z7984 Long term (current) use of oral hypoglycemic drugs: Secondary | ICD-10-CM | POA: Diagnosis not present

## 2021-09-28 DIAGNOSIS — E669 Obesity, unspecified: Secondary | ICD-10-CM

## 2021-09-28 DIAGNOSIS — I350 Nonrheumatic aortic (valve) stenosis: Secondary | ICD-10-CM

## 2021-09-28 DIAGNOSIS — R4182 Altered mental status, unspecified: Secondary | ICD-10-CM | POA: Diagnosis present

## 2021-09-28 DIAGNOSIS — R410 Disorientation, unspecified: Secondary | ICD-10-CM | POA: Diagnosis not present

## 2021-09-28 DIAGNOSIS — E039 Hypothyroidism, unspecified: Secondary | ICD-10-CM | POA: Diagnosis not present

## 2021-09-28 DIAGNOSIS — G459 Transient cerebral ischemic attack, unspecified: Principal | ICD-10-CM | POA: Diagnosis present

## 2021-09-28 DIAGNOSIS — R4781 Slurred speech: Secondary | ICD-10-CM | POA: Diagnosis not present

## 2021-09-28 LAB — GLUCOSE, CAPILLARY: Glucose-Capillary: 117 mg/dL — ABNORMAL HIGH (ref 70–99)

## 2021-09-28 LAB — DIFFERENTIAL
Abs Immature Granulocytes: 0.03 10*3/uL (ref 0.00–0.07)
Basophils Absolute: 0.1 10*3/uL (ref 0.0–0.1)
Basophils Relative: 1 %
Eosinophils Absolute: 0.2 10*3/uL (ref 0.0–0.5)
Eosinophils Relative: 3 %
Immature Granulocytes: 0 %
Lymphocytes Relative: 11 %
Lymphs Abs: 0.9 10*3/uL (ref 0.7–4.0)
Monocytes Absolute: 0.8 10*3/uL (ref 0.1–1.0)
Monocytes Relative: 10 %
Neutro Abs: 6.1 10*3/uL (ref 1.7–7.7)
Neutrophils Relative %: 75 %

## 2021-09-28 LAB — PROTIME-INR
INR: 1.3 — ABNORMAL HIGH (ref 0.8–1.2)
Prothrombin Time: 15.8 seconds — ABNORMAL HIGH (ref 11.4–15.2)

## 2021-09-28 LAB — COMPREHENSIVE METABOLIC PANEL
ALT: 13 U/L (ref 0–44)
AST: 19 U/L (ref 15–41)
Albumin: 3.6 g/dL (ref 3.5–5.0)
Alkaline Phosphatase: 54 U/L (ref 38–126)
Anion gap: 8 (ref 5–15)
BUN: 15 mg/dL (ref 8–23)
CO2: 26 mmol/L (ref 22–32)
Calcium: 9.3 mg/dL (ref 8.9–10.3)
Chloride: 108 mmol/L (ref 98–111)
Creatinine, Ser: 0.83 mg/dL (ref 0.44–1.00)
GFR, Estimated: >60 mL/min (ref >60)
Glucose, Bld: 141 mg/dL — ABNORMAL HIGH (ref 70–99)
Potassium: 3.9 mmol/L (ref 3.5–5.1)
Sodium: 142 mmol/L (ref 135–145)
Total Bilirubin: 0.6 mg/dL (ref 0.3–1.2)
Total Protein: 6.1 g/dL — ABNORMAL LOW (ref 6.5–8.1)

## 2021-09-28 LAB — I-STAT CHEM 8, ED
BUN: 16 mg/dL (ref 8–23)
Calcium, Ion: 1.21 mmol/L (ref 1.15–1.40)
Chloride: 103 mmol/L (ref 98–111)
Creatinine, Ser: 0.8 mg/dL (ref 0.44–1.00)
Glucose, Bld: 135 mg/dL — ABNORMAL HIGH (ref 70–99)
HCT: 33 % — ABNORMAL LOW (ref 36.0–46.0)
Hemoglobin: 11.2 g/dL — ABNORMAL LOW (ref 12.0–15.0)
Potassium: 4 mmol/L (ref 3.5–5.1)
Sodium: 142 mmol/L (ref 135–145)
TCO2: 28 mmol/L (ref 22–32)

## 2021-09-28 LAB — CBC
HCT: 34.4 % — ABNORMAL LOW (ref 36.0–46.0)
Hemoglobin: 11.5 g/dL — ABNORMAL LOW (ref 12.0–15.0)
MCH: 29.3 pg (ref 26.0–34.0)
MCHC: 33.4 g/dL (ref 30.0–36.0)
MCV: 87.8 fL (ref 80.0–100.0)
Platelets: 388 10*3/uL (ref 150–400)
RBC: 3.92 MIL/uL (ref 3.87–5.11)
RDW: 13 % (ref 11.5–15.5)
WBC: 8.1 10*3/uL (ref 4.0–10.5)
nRBC: 0 % (ref 0.0–0.2)

## 2021-09-28 LAB — CBG MONITORING, ED: Glucose-Capillary: 123 mg/dL — ABNORMAL HIGH (ref 70–99)

## 2021-09-28 LAB — APTT: aPTT: 30 seconds (ref 24–36)

## 2021-09-28 MED ORDER — INSULIN ASPART 100 UNIT/ML IJ SOLN: 0.0000 [IU] | Freq: Every day | INTRAMUSCULAR | Status: DC

## 2021-09-28 MED ORDER — ONDANSETRON HCL 4 MG/2ML IJ SOLN: 4.0000 mg | Freq: Four times a day (QID) | INTRAMUSCULAR | Status: DC | PRN

## 2021-09-28 MED ORDER — ENOXAPARIN SODIUM 40 MG/0.4ML IJ SOSY
40.0000 mg | PREFILLED_SYRINGE | INTRAMUSCULAR | Status: DC
  Administered 2021-09-28: 40 mg via SUBCUTANEOUS
  Filled 2021-09-28: qty 0.4

## 2021-09-28 MED ORDER — STROKE: EARLY STAGES OF RECOVERY BOOK
Freq: Once | Status: AC
  Filled 2021-09-28: qty 1

## 2021-09-28 MED ORDER — ATORVASTATIN CALCIUM 40 MG PO TABS
40.0000 mg | ORAL_TABLET | Freq: Every day | ORAL | Status: DC
  Administered 2021-09-28 – 2021-09-29 (×2): 40 mg via ORAL
  Filled 2021-09-28 (×2): qty 1

## 2021-09-28 MED ORDER — ONDANSETRON HCL 4 MG PO TABS: 4.0000 mg | ORAL_TABLET | Freq: Four times a day (QID) | ORAL | Status: DC | PRN

## 2021-09-28 MED ORDER — LEVOTHYROXINE SODIUM 88 MCG PO TABS
88.0000 ug | ORAL_TABLET | Freq: Every day | ORAL | Status: DC
  Administered 2021-09-29: 88 ug via ORAL
  Filled 2021-09-28: qty 1

## 2021-09-28 MED ORDER — INSULIN ASPART 100 UNIT/ML IJ SOLN: 0.0000 [IU] | Freq: Three times a day (TID) | INTRAMUSCULAR | Status: DC

## 2021-09-28 MED ORDER — ACETAMINOPHEN 325 MG PO TABS: 650.0000 mg | ORAL_TABLET | Freq: Four times a day (QID) | ORAL | Status: DC | PRN

## 2021-09-28 MED ORDER — IOHEXOL 350 MG/ML SOLN
75.0000 mL | Freq: Once | INTRAVENOUS | Status: AC | PRN
  Administered 2021-09-28: 75 mL via INTRAVENOUS

## 2021-09-28 MED ORDER — VITAMIN B-12 1000 MCG PO TABS
1000.0000 ug | ORAL_TABLET | Freq: Every day | ORAL | Status: DC
  Administered 2021-09-28 – 2021-09-29 (×2): 1000 ug via ORAL
  Filled 2021-09-28 (×2): qty 1

## 2021-09-28 MED ORDER — ASPIRIN EC 81 MG PO TBEC
81.0000 mg | DELAYED_RELEASE_TABLET | ORAL | Status: DC
Start: 2021-09-29 — End: 2021-09-30
  Administered 2021-09-29: 81 mg via ORAL
  Filled 2021-09-28: qty 1

## 2021-09-28 MED ORDER — ACETAMINOPHEN 650 MG RE SUPP: 650.0000 mg | Freq: Four times a day (QID) | RECTAL | Status: DC | PRN

## 2021-09-28 MED ORDER — SODIUM CHLORIDE 0.9% FLUSH
3.0000 mL | Freq: Once | INTRAVENOUS | Status: AC
  Administered 2021-09-28: 3 mL via INTRAVENOUS

## 2021-09-28 NOTE — ED Triage Notes (Signed)
Pt arrived POV from home c/o an episode of confusion yesterday. Pt states she was eating lunch and afterwards was trying to open some mail per family she had a moment where she could not think and could not get her words out. EMS came but she was not transported but family wanted her checked out today. Pt states the episode resolved quickly and she does not feel confused today.  ?

## 2021-09-28 NOTE — Telephone Encounter (Signed)
I spoke with pt; pt had few mins of trouble speaking,vision impairment and confusion on 09/28/21. EMTs came out BP 124/88 and advised pt to go to ED pt decided not to go. I spoke with pt and she feels OK now but concerned and pt is going to go to [facility] ED by car, family will drive her. Sending note to Dr Damita Dunnings who is out of office and Dr Darnell Level and Janett Billow CMA. No H/A CP or SOB now or yesterday. ?

## 2021-09-28 NOTE — Telephone Encounter (Signed)
Dana Sandoval called about the episode her grandmother had last night and wanted to know if Dr Para March would order the CT. ? ?Dana Sandoval listed her number below but also stated that her grandmother is up this morning and is able to talk as well. ? ? ?

## 2021-09-28 NOTE — H&P (Signed)
?History and Physical  ? ? ?PatientMarland Kitchen: Dana JarvisJannette B Sandoval QZE:[id number] DOB: 1941/05/10 ?DOA: 09/28/2021 ?DOS: the patient was seen and examined on 09/28/2021 ?PCP: Joaquim Namuncan, Graham S, MD  ?Patient coming from: Home ? ?Chief Complaint:  ?Chief Complaint  ?Patient presents with  ? Speech disturbance  ? ? ? ? ? ?HPI:  ?Dana Sandoval is an 81 y.o F with NIDDM, HTN who presented with transient speech disturbance. ? ?Was in USOH until yesterday around noon, suddenly couldn't speak properly.  No focal weakness or numbness.  Lasted minutes then resolved.  Patient downplays this to me somewhat, stating that she "was frustrated about a difficult to read letter" and then spoke to her daughter, who "thought I was slurred" but she felt fine. ? ?Today, shared this with others, who prompted her to come to the ER.  In the ER, BP 145/60, CTH normal.  Hemogram normal, electrolytes and renal function normal.  Discussed with Neuro who recommended TIA work up. ? ? ? ? ? ?Review of Systems: Review of Systems  ?Constitutional:  Negative for chills and fever.  ?Neurological:  Positive for speech change. Negative for sensory change, focal weakness, loss of consciousness and weakness.  ?All other systems reviewed and are negative.  ? ? ? ? ?Past Medical History:  ?Diagnosis Date  ? Aortic stenosis   ? Arthritis   ? B12 deficiency   ? Diabetes mellitus   ? type 2  ? Heart murmur   ? aortic stenosis  ? Hyperlipidemia   ? Hypothyroidism   ? IBS (irritable bowel syndrome)   ? Macular degeneration   ? NSVD (normal spontaneous vaginal delivery)   ? X 4  ? Pain   ? INTRASCAPULAR PAIN  ? SAB (spontaneous abortion)   ? X 2  ? Thyroid disease   ? HYPOTHYROIDISM  ? ?Past Surgical History:  ?Procedure Laterality Date  ? ABDOMINAL HYSTERECTOMY  1977  ? TAH  (FIBROIDS)  ? CATARACT EXTRACTION Bilateral   ? DILATION AND CURETTAGE OF UTERUS    ? DOPPLER ECHOCARDIOGRAPHY  2009  ? HAD AN ECHOCARDIOGRAM THAT SHOWED MODERATE MITRAL  ANNULAR CALCIFICATION AND A NORMAL  EJECTION FRACTION OF 55%  ? EYE SURGERY Bilateral   ? Cat Sx  ? OTHER SURGICAL HISTORY    ? HYSTERECTOMY  ? SHOULDER SURGERY    ? RIGHT SHOULDER SURGERY  ? THYROIDECTOMY    ? 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  ? RIGHT  ? TOTAL KNEE ARTHROPLASTY Left 09/10/2018  ? Procedure: TOTAL KNEE ARTHROPLASTY;  Surgeon: Marcene Corningalldorf, Peter, MD;  Location: [facility];  Service: Orthopedics;  Laterality: Left;  ? ?Social History:  reports that she has never smoked. She has never used smokeless tobacco. She reports that she does not drink alcohol and does not use drugs. ? ?Allergies  ?Allergen Reactions  ? Codeine Itching  ?  After 3 days  ? Dilaudid [Hydromorphone Hcl] Itching  ?  After 3 days  ? ? ?Family History  ?Problem Relation Age of Onset  ? Osteoporosis Mother   ? Heart disease Mother   ? Macular degeneration Mother   ? Heart disease Father   ? Cancer Father   ?     BRAIN TUMOR  ? Diabetes Maternal Aunt   ? Diabetes Maternal Uncle   ? Cancer Brother   ?     BRAIN TUMOR  ? Colon cancer Neg Hx   ? Breast cancer Neg Hx   ? ? ?Prior to Admission medications   ?  Medication Sig Start Date End Date Taking? Authorizing Provider  ?Aflibercept 2 MG/0.05ML SOLN 2 mg by Intravitreal route every 30 (thirty) days.   Yes [provider]  ?aspirin EC 81 MG tablet Take 1 tablet (81 mg total) by mouth every other day. 11/18/20  Yes Joaquim Nam, MD  ?atorvastatin (LIPITOR) 40 MG tablet TAKE 1 TABLET BY MOUTH ONCE A DAY AT 6PM ?Patient taking differently: Take 40 mg by mouth daily. 07/28/21  Yes Joaquim Nam, MD  ?levothyroxine (SYNTHROID) 88 MCG tablet Take 1 tablet (88 mcg total) by mouth daily. 09/15/21  Yes Joaquim Nam, MD  ?metFORMIN (GLUCOPHAGE) 500 MG tablet Take 1 tablet (500 mg total) by mouth 2 (two) times daily with a meal. 09/07/21  Yes Joaquim Nam, MD  ?Multiple Vitamins-Minerals (PRESERVISION AREDS 2 PO) Take 1 tablet by mouth 2 (two) times daily.    Yes [provider]  ?Neomycin-Bacitracin-Polymyxin  (TRIPLE ANTIBIOTIC EX) Apply 1 application. topically daily as needed (pain).   Yes [provider]  ?ramipril (ALTACE) 10 MG capsule TAKE 1 CAPSULE BY MOUTH ONCE DAILY ?Patient taking differently: Take 10 mg by mouth daily. 07/28/21  Yes Joaquim Nam, MD  ?valACYclovir (VALTREX) 500 MG tablet Take 500 mg by mouth daily as needed (fever blister). 12/15/19  Yes [provider]  ?vitamin B-12 (CYANOCOBALAMIN) 1000 MCG tablet Take 1 tablet (1,000 mcg total) by mouth daily. 09/15/21  Yes Joaquim Nam, MD  ?VITAMIN D PO Take 1,000 Units by mouth daily.   Yes [provider]  ?glucose blood (ACCU-CHEK AVIVA PLUS) test strip Use as directed to check BS daily. Dx: E11.9 04/13/21   Joaquim Nam, MD  ? ? ?Physical Exam: ?Vitals:  ? 09/28/21 1545 09/28/21 1600 09/28/21 1615 09/28/21 1630  ?BP: (!) 152/57  (!) 155/74 (!) 167/63  ?Pulse: (!) 52 (!) 144 (!) 31 78  ?Resp: 14 16 18 18   ?Temp:      ?TempSrc:      ?SpO2: 99% 91% 96% 98%  ?Weight:      ?Height:      ? ?Elderly adult female, sitting in gurney, no acute distress, interactive ?Anicteric, conjunctival pink, lids and lashes normal.  No nasal deformity, discharge, or epistaxis.  Oropharynx moist, dentition in good repair, no oral lesions. ?RRR, occasional PACs, soft systolic ejection murmur, no peripheral edema, JVP normal. ?Respiratory effort normal, lungs clear without rales or wheezes. ?Abdomen soft without tenderness palpation or guarding, no hepatosplenomegaly, no masses. ?Awake and alert, extraocular movements intact, cranial nerves III through XII intact, face symmetric, speech fluent, moves all 4 extremities with normal strength and coordination, finger-nose testing intact, negative Romberg. ? ? ? ? ? ? ?Data Reviewed: ?Discussed with emergency medicine provider.  Nursing notes reviewed, outside records reviewed.  Vital signs reviewed. ?PTT and INR essentially normal. ?Electrolytes and renal function normal, hemogram  normal. ?Echocardiogram from 2020 shows aortic stenosis, normal EF, grade 2 diastolic dysfunction ?ECG from this hospitalization personally reviewed, shows sinus rhythm, PACs, no ST changes. ? ? ? ? ? ? ?Assessment and Plan: ?* TIA (transient ischemic attack) ?Transient dysphasia, resolved in minutes.  Age 77, BP elevated, DM.   ?- Obtain MRI brain ?- Consult Neurology ?-Non-invasive angiography ordered ?-Echocardiogram ordered ?-Lipids ordered  ?-Continue home statin ?-Aspirin ordered at admission  ?-Atrial fibrillation: not present, monitor on tele ?-tPA not given because symptoms resolved ?-Dysphagia screen ordered in ER ?-PT eval ordered ?-Smoking cessation: not pertinent ? ? ? ? ? ?  Essential hypertension ?-Hold ramipril until MRI brain resolved ? ?B12 deficiency ?Hgb normal ?-Continue home B12 ? ?Acquired hypothyroidism ?Recent TSH normal ?-Continue levothyroxine ? ?Controlled type 2 diabetes mellitus without complication, without long-term current use of insulin (HCC) ?A1c 6.2% this month. ?- Hold metformin ?- STart SS insulin corrections ? ?Aortic stenosis, moderate ?  ? ? ? ? ? Advance Care Planning: Full code, discussed with patient and daughter-in-law, patient cannot remember what is on her advanced directive, so we will presume full code ? ?Consults: Neurology ? ?Family Communication: Daughter-in-law at the bedside, called the daughter, no answer ? ?Severity of Illness: ?The appropriate patient status for this patient is OBSERVATION. Observation status is judged to be reasonable and necessary in order to provide the required intensity of service to ensure the patient's safety. The patient's presenting symptoms, physical exam findings, and initial radiographic and laboratory data in the context of their medical condition is felt to place them at decreased risk for further clinical deterioration. Furthermore, it is anticipated that the patient will be medically stable for discharge from the hospital within  2 midnights of admission.  ? ?Author: ?Alberteen Sam, MD ?09/28/2021 6:28 PM ? ?For on call review [email or web address].  ?

## 2021-09-28 NOTE — ED Notes (Signed)
MD finished at [facility], transport here for pt. ?

## 2021-09-28 NOTE — ED Notes (Signed)
EDP at [facility]. Family at [facility]. MRI pending (~30 minutes).  ?

## 2021-09-28 NOTE — ED Notes (Signed)
Up to b/r, steady gait. Daughter at [facility].  ?

## 2021-09-28 NOTE — ED Notes (Signed)
Pt to MRI, no changes.  ?

## 2021-09-28 NOTE — ED Provider Notes (Signed)
?[facility] EMERGENCY DEPARTMENT ?Provider Note ? ? ?CSN: [id number] ?Arrival date & time: 09/28/21  1114 ? ?  ? ?History ? ?Chief Complaint  ?Patient presents with  ? Altered Mental Status  ? ? ?Dana Sandoval is a 81 y.o. female.  Presented to emergency department with concern for episode of confusion, speech change.  Patient reports that yesterday she had a episode of floaters in her right eye, these resolved.  Then around 1230 or so she had an episode where she had a hard time with word finding difficulty, speech difficulty.  Felt like she could understand what others were saying but she had a hard time getting the right words at home.  This episode lasted only a couple minutes and then resolved.  She has not had any further symptoms since this time.  EMS was contacted yesterday afternoon but patient declined transport because her symptoms had resolved.  Patient reports that her primary care doctor was notified of what happened and patient was advised to come to ER for further eval. ? ?Additional history obtained from daughter-in-law at bedside and son over the phone.  They state patient is acting at baseline now.  Patient's husband who is very hard of hearing was with her yesterday during the episode. ? ?HPI ? ?  ? ?Home Medications ?Prior to Admission medications   ?Medication Sig Start Date End Date Taking? Authorizing Provider  ?aspirin EC 81 MG tablet Take 1 tablet (81 mg total) by mouth every other day. 11/18/20   Joaquim Nam, MD  ?atorvastatin (LIPITOR) 40 MG tablet TAKE 1 TABLET BY MOUTH ONCE A DAY AT 6PM 07/28/21   Joaquim Nam, MD  ?glucose blood (ACCU-CHEK AVIVA PLUS) test strip Use as directed to check BS daily. Dx: E11.9 04/13/21   Joaquim Nam, MD  ?levothyroxine (SYNTHROID) 88 MCG tablet Take 1 tablet (88 mcg total) by mouth daily. 09/15/21   Joaquim Nam, MD  ?metFORMIN (GLUCOPHAGE) 500 MG tablet Take 1 tablet (500 mg total) by mouth 2 (two) times daily with a meal.  09/07/21   Joaquim Nam, MD  ?Multiple Vitamins-Minerals (PRESERVISION AREDS 2 PO) Take 1 tablet by mouth 2 (two) times daily.     [provider]  ?ramipril (ALTACE) 10 MG capsule TAKE 1 CAPSULE BY MOUTH ONCE DAILY 07/28/21   Joaquim Nam, MD  ?valACYclovir (VALTREX) 500 MG tablet Take 500 mg by mouth daily. As needed 12/15/19   [provider]  ?vitamin B-12 (CYANOCOBALAMIN) 1000 MCG tablet Take 1 tablet (1,000 mcg total) by mouth daily. 09/15/21   Joaquim Nam, MD  ?VITAMIN D PO Take 1,000 Units by mouth daily at 2 PM.    [provider]  ?   ? ?Allergies    ?Codeine and Dilaudid [hydromorphone hcl]   ? ?Review of Systems   ?Review of Systems  ?Neurological:  Positive for speech difficulty.  ?All other systems reviewed and are negative. ? ?Physical Exam ?Updated Vital Signs ?BP (!) 167/63   Pulse 78   Temp 98.4 ?F (36.9 ?C) (Oral)   Resp 18   Ht 5\' 2"  (1.575 m)   Wt 77.1 kg   SpO2 98%   BMI 31.09 kg/m?  ?Physical Exam ?Vitals and nursing note reviewed.  ?Constitutional:   ?   General: She is not in acute distress. ?   Appearance: She is well-developed.  ?HENT:  ?   Head: Normocephalic and atraumatic.  ?Eyes:  ?  Conjunctiva/sclera: Conjunctivae normal.  ?Cardiovascular:  ?   Rate and Rhythm: Normal rate and regular rhythm.  ?   Heart sounds: No murmur heard. ?Pulmonary:  ?   Effort: Pulmonary effort is normal. No respiratory distress.  ?   Breath sounds: Normal breath sounds.  ?Abdominal:  ?   Palpations: Abdomen is soft.  ?   Tenderness: There is no abdominal tenderness.  ?Musculoskeletal:     ?   General: No swelling.  ?   Cervical back: Neck supple.  ?Skin: ?   General: Skin is warm and dry.  ?   Capillary Refill: Capillary refill takes less than 2 seconds.  ?Neurological:  ?   Mental Status: She is alert.  ?   Comments: AAOx3 ?CN 2-12 intact, speech clear visual fields intact ?5/5 strength in b/l UE and LE ?Sensation to light touch intact in b/l UE and LE ?Normal FNF   ?Psychiatric:     ?   Mood and Affect: Mood normal.  ? ? ?ED Results / Procedures / Treatments   ?Labs ?(all labs ordered are listed, but only abnormal results are displayed) ?Labs Reviewed  ?COMPREHENSIVE METABOLIC PANEL - Abnormal; Notable for the following components:  ?    Result Value  ? Glucose, Bld 141 (*)   ? Total Protein 6.1 (*)   ? All other components within normal limits  ?CBC - Abnormal; Notable for the following components:  ? Hemoglobin 11.5 (*)   ? HCT 34.4 (*)   ? All other components within normal limits  ?PROTIME-INR - Abnormal; Notable for the following components:  ? Prothrombin Time 15.8 (*)   ? INR 1.3 (*)   ? All other components within normal limits  ?CBG MONITORING, ED - Abnormal; Notable for the following components:  ? Glucose-Capillary 123 (*)   ? All other components within normal limits  ?I-STAT CHEM 8, ED - Abnormal; Notable for the following components:  ? Glucose, Bld 135 (*)   ? Hemoglobin 11.2 (*)   ? HCT 33.0 (*)   ? All other components within normal limits  ?APTT  ?DIFFERENTIAL  ?LIPID PANEL  ?HEMOGLOBIN A1C  ? ? ?EKG ?None ? ?Radiology ?CT HEAD WO CONTRAST ? ?Result Date: 09/28/2021 ?CLINICAL DATA:  Head trauma, moderate to severe. EXAM: CT HEAD WITHOUT CONTRAST TECHNIQUE: Contiguous axial images were obtained from the base of the skull through the vertex without intravenous contrast. RADIATION DOSE REDUCTION: This exam was performed according to the departmental dose-optimization program which includes automated exposure control, adjustment of the mA and/or kV according to patient size and/or use of iterative reconstruction technique. COMPARISON:  None. FINDINGS: Brain: Age related volume loss. Mild chronic small-vessel ischemic changes of the hemispheric white matter. No sign of acute infarction, mass lesion, hemorrhage, hydrocephalus or extra-axial collection. Vascular: There is atherosclerotic calcification of the major vessels at the base of the brain. Skull: No skull  fracture. Sinuses/Orbits: Chronic appearing opacification of the left maxillary sinus with sinus volume loss that could go along with chronic maxillary atelectasis. Orbits negative. Other: None IMPRESSION: Age related volume loss. No focal or acute intracranial finding. No traumatic finding. Chronic appearing opacification of the left maxillary sinus with maxillary sinus volume loss that could go along with chronic maxillary atelectasis. Electronically Signed   By: Paulina Fusi M.D.   On: 09/28/2021 13:09   ? ?Procedures ?Procedures  ? ? ?Medications Ordered in ED ?Medications  ?sodium chloride flush (NS) 0.9 % injection 3 mL (3 mLs Intravenous  Given 09/28/21 1539)  ? ? ?ED Course/ Medical Decision Making/ A&P ?  ?                        ?Medical Decision Making ?Amount and/or Complexity of Data Reviewed ?Labs: ordered. ?Radiology: ordered. ? ? ?81 year old lady presenting to the emergency room with concern for episode of speech change, confusion yesterday.  Episode only lasting a few minutes.  Episode however concerning for possibility of TIA.  Currently she has a normal neurologic exam.  Basic lab work was reviewed, no electrolyte derangement, no anemia. PT/INR, PTT ordered as part of stroke order set, slight elevation in INR but grossly normal.  CT head obtained, negative for acute pathology.  I independently reviewed the CT imaging.  Discussed the case with Dr. Thomasena Edisollins with neurology.  Patient has an ABCD 2 score of 4 which places her in moderate risk TIA category and Dr. Thomasena Edisollins would recommend admitting for stroke/TIA work-up.  Specifically recommend CTA head/neck and MRI brain without contrast.  He will see as consult.  Will consult hospitalist for admit. ? ?Additional history obtained from family at bedside and over the phone.  Family members updated throughout patient's stay. ? ?Additional history also obtained from review of last PCP visit with Dr. Para Marchuncan, patient has history of diabetes, hypertension,  hyperlipidemia, aortic stenosis.  No prior history of TIA/stroke noted in chart review or per patient. ? ? ? ? ? ? ? ?Final Clinical Impression(s) / ED Diagnoses ?Final diagnoses:  ?TIA (transient ischemic attack)

## 2021-09-28 NOTE — Consult Note (Signed)
?                    NEURO HOSPITALIST CONSULT NOTE  ? ?Requestig physician: Dr. Maryfrances Bunnell ? ?Reason for Consult: Transient episode of confusion ? ?History obtained from:  Patient and Chart    ? ?HPI:                                                                                                                                         ? Dana Sandoval is an 81 y.o. female with a PMHx of aortic stenosis, B12 deficiency, DM2, HLD, hypothyroidism and macular degeneration who presented to the ED on Wednesday morning after having an episode of confusion on Tuesday. Symptom onset was at approximately 1230. She was at home eating lunch and afterwards was opening some mail when she suddenly could not make sense of what she was reading, had trouble thinking and could not get her words out. This is described as word-finding difficulty on the EDP note, but patient's description also suggests a component of alexia or receptive aphasia given her inability to make sense of the letter she was reading. EMS was called but she decided not to go to the ED. The next day, family decided that they wanted her checked out, so she was taken to the ED. She states that her symptoms completely resolved on Tuesday a few minutes after they started and have not recurred. She did have a floater in her right eye at the time of the symptoms and was wondering if this was connected to her transient confusion.  ? ?Past Medical History:  ?Diagnosis Date  ? Aortic stenosis   ? Arthritis   ? B12 deficiency   ? Diabetes mellitus   ? type 2  ? Heart murmur   ? aortic stenosis  ? Hyperlipidemia   ? Hypothyroidism   ? IBS (irritable bowel syndrome)   ? Macular degeneration   ? NSVD (normal spontaneous vaginal delivery)   ? X 4  ? Pain   ? INTRASCAPULAR PAIN  ? SAB (spontaneous abortion)   ? X 2  ? Thyroid disease   ? HYPOTHYROIDISM  ? ? ?Past Surgical History:  ?Procedure Laterality Date  ? ABDOMINAL HYSTERECTOMY  1977  ? TAH  (FIBROIDS)  ? CATARACT  EXTRACTION Bilateral   ? DILATION AND CURETTAGE OF UTERUS    ? DOPPLER ECHOCARDIOGRAPHY  2009  ? HAD AN ECHOCARDIOGRAM THAT SHOWED MODERATE MITRAL  ANNULAR CALCIFICATION AND A NORMAL EJECTION FRACTION OF 55%  ? EYE SURGERY Bilateral   ? Cat Sx  ? OTHER SURGICAL HISTORY    ? HYSTERECTOMY  ? SHOULDER SURGERY    ? RIGHT SHOULDER SURGERY  ? THYROIDECTOMY    ? 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  ? RIGHT  ? TOTAL KNEE ARTHROPLASTY Left 09/10/2018  ? Procedure: TOTAL KNEE ARTHROPLASTY;  Surgeon: Marcene Corning, MD;  Location: [facility];  Service: Orthopedics;  Laterality: Left;  ? ? ?Family History  ?Problem Relation Age of Onset  ? Osteoporosis Mother   ? Heart disease Mother   ? Macular degeneration Mother   ? Heart disease Father   ? Cancer Father   ?     BRAIN TUMOR  ? Diabetes Maternal Aunt   ? Diabetes Maternal Uncle   ? Cancer Brother   ?     BRAIN TUMOR  ? Colon cancer Neg Hx   ? Breast cancer Neg Hx   ?          ? ?Social History:  reports that she has never smoked. She has never used smokeless tobacco. She reports that she does not drink alcohol and does not use drugs. ? ?Allergies  ?Allergen Reactions  ? Codeine Itching  ?  After 3 days  ? Dilaudid [Hydromorphone Hcl] Itching  ?  After 3 days  ? ? ?MEDICATIONS:                                                                                                                     ?No current facility-administered medications on file prior to encounter.  ? ?Current Outpatient Medications on File Prior to Encounter  ?Medication Sig Dispense Refill  ? Aflibercept 2 MG/0.05ML SOLN 2 mg by Intravitreal route every 30 (thirty) days.    ? aspirin EC 81 MG tablet Take 1 tablet (81 mg total) by mouth every other day.    ? atorvastatin (LIPITOR) 40 MG tablet TAKE 1 TABLET BY MOUTH ONCE A DAY AT 6PM (Patient taking differently: Take 40 mg by mouth daily.) 90 tablet 3  ? levothyroxine (SYNTHROID) 88 MCG tablet Take 1 tablet (88 mcg total) by mouth daily. 90 tablet 3  ? metFORMIN  (GLUCOPHAGE) 500 MG tablet Take 1 tablet (500 mg total) by mouth 2 (two) times daily with a meal. 180 tablet 3  ? Multiple Vitamins-Minerals (PRESERVISION AREDS 2 PO) Take 1 tablet by mouth 2 (two) times daily.     ? Neomycin-Bacitracin-Polymyxin (TRIPLE ANTIBIOTIC EX) Apply 1 application. topically daily as needed (pain).    ? ramipril (ALTACE) 10 MG capsule TAKE 1 CAPSULE BY MOUTH ONCE DAILY (Patient taking differently: Take 10 mg by mouth daily.) 90 capsule 3  ? valACYclovir (VALTREX) 500 MG tablet Take 500 mg by mouth daily as needed (fever blister).    ? vitamin B-12 (CYANOCOBALAMIN) 1000 MCG tablet Take 1 tablet (1,000 mcg total) by mouth daily.    ? VITAMIN D PO Take 1,000 Units by mouth daily.    ? glucose blood (ACCU-CHEK AVIVA PLUS) test strip Use as directed to check BS daily. Dx: E11.9 100 each 2  ? ? ? ?Scheduled: ? aspirin EC  81 mg Oral QODAY  ? atorvastatin  40 mg Oral q1800  ? enoxaparin (LOVENOX) injection  40 mg Subcutaneous Q24H  ? insulin aspart  0-5 Units Subcutaneous QHS  ? insulin aspart  0-9 Units Subcutaneous TID  WC  ? levothyroxine  88 mcg Oral Daily  ? vitamin B-12  1,000 mcg Oral Daily  ? ?Continuous: ? ? ?ROS:                                                                                                                                       ?No headache or other pain. No recurrence of the floater symptom described in the HPI. No SOB, limb weakness, numbness, paresthesia, dizziness or nausea. Other ROS as per HPI.   ? ?Blood pressure (!) 174/44, pulse 65, temperature 98.4 ?F (36.9 ?C), temperature source Oral, resp. rate 17, height  (1.575 m), weight 77.1 kg, SpO2 99 %. ? ? ?General Examination:                                                                                                      ? ?Physical Exam  ?HEENT-  [place]/AT  ?Lungs- Respirations unlabored ?Extremities- No edema ? ? ?Neurological Examination ?Mental Status: Awake and alert. Fully oriented. Speech fluent with  intact comprehension and naming.  ?Cranial Nerves: ?II: Temporal visual fields intact with no extinction to DSS. PERRL.   ?III,IV, VI: No ptosis. EOMI.  ?V: Temp sensation equal bilaterally ?VII: Smile symmetric ?VIII: Hearing intact to voice ?IX,X: No hypophonia or hoarseness ?XI: Symmetric shoulder shrug ?XII: Midline tongue extension ?Motor: ?Right : Upper extremity   5/5    Left:     Upper extremity   5/5 ? Lower extremity   5/5     Lower extremity   5/5 ?No pronator drift ?Sensory: Temp and light touch intact throughout, bilaterally. No extinction to DSS.  ?Deep Tendon Reflexes: 2+ and symmetric brachioradialis and biceps. 1+ left patellar, 0 left patellar (history of knee operations) ?Cerebellar: No ataxia with FNF bilaterally  ?Gait: Deferred ?  ?Lab Results: ?Basic Metabolic Panel: ?Recent Labs  ?Lab 09/28/21 ?1200 09/28/21 ?1217  ?NA 142 142  ?K 3.9 4.0  ?CL 108 103  ?CO2 26  --   ?GLUCOSE 141* 135*  ?BUN 15 16  ?CREATININE 0.83 0.80  ?CALCIUM 9.3  --   ? ? ?CBC: ?Recent Labs  ?Lab 09/28/21 ?1200 09/28/21 ?1217  ?WBC 8.1  --   ?NEUTROABS 6.1  --   ?HGB 11.5* 11.2*  ?HCT 34.4* 33.0*  ?MCV 87.8  --   ?PLT 388  --   ? ? ?Cardiac Enzymes: ?No results for input(s): CKTOTAL, CKMB, CKMBINDEX, TROPONINI in the last 168 hours. ? ?Lipid Panel: ?  No results for input(s): CHOL, TRIG, HDL, CHOLHDL, VLDL, LDLCALC in the last 168 hours. ? ?Imaging: ?CT HEAD WO CONTRAST ? ?Result Date: 09/28/2021 ?CLINICAL DATA:  Head trauma, moderate to severe. EXAM: CT HEAD WITHOUT CONTRAST TECHNIQUE: Contiguous axial images were obtained from the base of the skull through the vertex without intravenous contrast. RADIATION DOSE REDUCTION: This exam was performed according to the departmental dose-optimization program which includes automated exposure control, adjustment of the mA and/or kV according to patient size and/or use of iterative reconstruction technique. COMPARISON:  None. FINDINGS: Brain: Age related volume loss. Mild chronic  small-vessel ischemic changes of the hemispheric white matter. No sign of acute infarction, mass lesion, hemorrhage, hydrocephalus or extra-axial collection. Vascular: There is atherosclerotic calcification of the ma

## 2021-09-28 NOTE — Hospital Course (Addendum)
Dana Sandoval is an 81 y.o F with NIDDM, HTN who presented with transient speech disturbance. ? ?Was in Early until yesterday around noon, suddenly couldn't speak properly.  No focal weakness or numbness.  Lasted minutes then resolved.  Patient downplays this to me somewhat, stating that she "was frustrated about a difficult to read letter" and then spoke to her daughter, who "thought I was slurred" but she felt fine. ? ?Today, shared this with others, who prompted her to come to the ER.  In the ER, BP 145/60, CTH normal.  Hemogram normal, electrolytes and renal function normal.  Discussed with Neuro who recommended TIA work up. ?

## 2021-09-28 NOTE — Assessment & Plan Note (Addendum)
Transient dysphasia, resolved in minutes.  Age 81, BP elevated, DM.   ?- Obtain MRI brain ?- Consult Neurology ?-Non-invasive angiography ordered ?-Echocardiogram ordered ?-Lipids ordered  ?-Continue home statin ?-Aspirin ordered at admission  ?-Atrial fibrillation: not present, monitor on tele ?-tPA not given because symptoms resolved ?-Dysphagia screen ordered in ER ?-PT eval ordered ?-Smoking cessation: not pertinent ? ? ? ? ?

## 2021-09-28 NOTE — ED Notes (Signed)
Pt alert, NAD, calm, interactive, resps e/u, speaking in clear complete sentences. Reports h/o macular degeneration. Reports episode yesterday of visual change from baseline/ confusion. Seen yesterday who said everything was reassuring, but encouraged being seen. PCP today recommended getting checked out. Here with daughter at [facility]. Pt verbalizes feeling normal, denies pain, HA, sob, visual changes, numbness/ tingling, weakness, sob, nausea dizziness or other sx.  ?

## 2021-09-28 NOTE — Assessment & Plan Note (Signed)
Recent TSH normal ?-Continue levothyroxine ?

## 2021-09-28 NOTE — ED Notes (Signed)
EDP at BS 

## 2021-09-28 NOTE — ED Provider Triage Note (Signed)
Emergency Medicine Provider Triage Evaluation Note ? ?Dana Sandoval , a 81 y.o. female  was evaluated in triage.  Pt complains of a few minutes of confusion yesterday.  Says that she had difficulty reading her newspaper and forming her words.  Daughter called EMS and everything was clear yesterday.  No concerns today however daughter is a doctor and wants her to be evaluated for TIA ? ?Review of Systems  ?Positive: Confusion yesterday ?Negative: Visual disturbance, confusion, aphasia, weakness, numbness ? ?Physical Exam  ?BP (!) 143/58   Pulse 81   Temp 98.4 ?F (36.9 ?C) (Oral)   Resp 20   Ht 5\' 2"  (1.575 m)   Wt 77.1 kg   SpO2 98%   BMI 31.09 kg/m?  ?Gen:   Awake, no distress   ?Resp:  Normal effort  ?MSK:   Moves extremities without difficulty  ?Other:  Alert and oriented, NIH and VAN/LVO negative ? ?Medical Decision Making  ?Medically screening exam initiated at 11:53 AM.  Appropriate orders placed.  Trent B Krach was informed that the remainder of the evaluation will be completed by another provider, this initial triage assessment does not replace that evaluation, and the importance of remaining in the ED until their evaluation is complete. ? ? ?  ?Rhae Hammock, PA-C ?09/28/21 1154 ? ?

## 2021-09-28 NOTE — Assessment & Plan Note (Signed)
A1c 6.2% this month. ?- Hold metformin ?- STart SS insulin corrections ?

## 2021-09-28 NOTE — Assessment & Plan Note (Signed)
-  Hold ramipril until MRI brain resolved ?

## 2021-09-28 NOTE — ED Notes (Signed)
Back from MRI. Admitting at [facility]. Family back to room. No changes. Pt alert, NAD, calm, interactive.  ?

## 2021-09-28 NOTE — Assessment & Plan Note (Signed)
Hgb normal ?-Continue home B12 ?

## 2021-09-29 ENCOUNTER — Observation Stay (HOSPITAL_COMMUNITY): Payer: Medicare PPO

## 2021-09-29 DIAGNOSIS — G459 Transient cerebral ischemic attack, unspecified: Secondary | ICD-10-CM

## 2021-09-29 DIAGNOSIS — R29818 Other symptoms and signs involving the nervous system: Secondary | ICD-10-CM

## 2021-09-29 DIAGNOSIS — I350 Nonrheumatic aortic (valve) stenosis: Secondary | ICD-10-CM | POA: Diagnosis not present

## 2021-09-29 DIAGNOSIS — E039 Hypothyroidism, unspecified: Secondary | ICD-10-CM | POA: Diagnosis not present

## 2021-09-29 DIAGNOSIS — E119 Type 2 diabetes mellitus without complications: Secondary | ICD-10-CM | POA: Diagnosis not present

## 2021-09-29 DIAGNOSIS — E669 Obesity, unspecified: Secondary | ICD-10-CM

## 2021-09-29 DIAGNOSIS — E538 Deficiency of other specified B group vitamins: Secondary | ICD-10-CM | POA: Diagnosis not present

## 2021-09-29 DIAGNOSIS — G43109 Migraine with aura, not intractable, without status migrainosus: Secondary | ICD-10-CM | POA: Diagnosis not present

## 2021-09-29 DIAGNOSIS — I10 Essential (primary) hypertension: Secondary | ICD-10-CM | POA: Diagnosis not present

## 2021-09-29 LAB — GLUCOSE, CAPILLARY
Glucose-Capillary: 103 mg/dL — ABNORMAL HIGH (ref 70–99)
Glucose-Capillary: 86 mg/dL (ref 70–99)
Glucose-Capillary: 121 mg/dL — ABNORMAL HIGH (ref 70–99)

## 2021-09-29 LAB — ECHOCARDIOGRAM COMPLETE
AR max vel: 0.61 cm2
AV Area VTI: 0.61 cm2
AV Area mean vel: 0.63 cm2
AV Mean grad: 25 mmHg
AV Peak grad: 46.8 mmHg
Ao pk vel: 3.42 m/s
Area-P 1/2: 3.77 cm2
Height: 62 in
MV VTI: 1.17 cm2
S' Lateral: 3 cm
Weight: 2720 oz

## 2021-09-29 LAB — LIPID PANEL
Cholesterol: 126 mg/dL (ref 0–200)
HDL: 31 mg/dL — ABNORMAL LOW (ref >40)
LDL Cholesterol: 74 mg/dL (ref 0–99)
Total CHOL/HDL Ratio: 4.1 RATIO
Triglycerides: 106 mg/dL (ref <150)
VLDL: 21 mg/dL (ref 0–40)

## 2021-09-29 MED ORDER — CLOPIDOGREL BISULFATE 75 MG PO TABS: 75.0000 mg | ORAL_TABLET | Freq: Every day | ORAL | 0 refills | Status: DC

## 2021-09-29 MED ORDER — RAMIPRIL 5 MG PO CAPS
10.0000 mg | ORAL_CAPSULE | Freq: Every day | ORAL | Status: DC
  Administered 2021-09-29: 10 mg via ORAL
  Filled 2021-09-29: qty 2

## 2021-09-29 NOTE — Progress Notes (Signed)
Echo attempted. Patient with therapy at time of attempt. Will attempt again as schedule permits. ?

## 2021-09-29 NOTE — TOC Initial Note (Signed)
Transition of Care (TOC) - Initial/Assessment Note  ? ? ?Patient Details  ?Name: Dana Sandoval ?MRN: [id number] ?Date of Birth: [birth year] ? ?Transition of Care (TOC) CM/SW Contact:    ?Kermit Balo, RN ?Phone Number: ?09/29/2021, 4:11 PM ? ?Clinical Narrative:                 ?Patient is from home with her spouse. She has supervision at home.  ?No DME. Denies issues with home medication or transportation.  ?No f/u per PT.  ?Pt has transportation home once discharged.  ? ?Expected Discharge Plan: Home/Self Care ?Barriers to Discharge: Continued Medical Work up ? ? ?Patient Goals and CMS Choice ?  ?  ?  ? ?Expected Discharge Plan and Services ?Expected Discharge Plan: Home/Self Care ?  ?Discharge Planning Services: CM Consult ?  ?  ?                ?  ?  ?  ?  ?  ?  ?  ?  ?  ?  ? ?Prior Living Arrangements/Services ?  ?Lives with:: Spouse ?Patient language and need for interpreter reviewed:: Yes ?Do you feel safe going back to the place where you live?: Yes      ?  ?Care giver support system in place?: Yes (comment) ?  ?Criminal Activity/Legal Involvement Pertinent to Current Situation/Hospitalization: No - Comment as needed ? ?Activities of Daily Living ?Home Assistive Devices/Equipment: None ?ADL Screening (condition at time of admission) ?Patient's cognitive ability adequate to safely complete daily activities?: Yes ?Is the patient deaf or have difficulty hearing?: No ?Does the patient have difficulty seeing, even when wearing glasses/contacts?: No ?Does the patient have difficulty concentrating, remembering, or making decisions?: No ?Patient able to express need for assistance with ADLs?: Yes ?Does the patient have difficulty dressing or bathing?: No ?Independently performs ADLs?: Yes (appropriate for developmental age) ?Does the patient have difficulty walking or climbing stairs?: No ?Weakness of Legs: None ?Weakness of Arms/Hands: None ? ?Permission Sought/Granted ?  ?  ?   ?   ?   ?   ? ?Emotional  Assessment ?Appearance:: Appears stated age ?Attitude/Demeanor/Rapport: Engaged ?  ?Orientation: : Oriented to Self, Oriented to Place, Oriented to  Time, Oriented to Situation ?  ?Psych Involvement: No (comment) ? ?Admission diagnosis:  TIA (transient ischemic attack) [G45.9] ?Patient Active Problem List  ? Diagnosis Date Noted  ? Obesity (BMI 30-39.9) 09/29/2021  ? TIA (transient ischemic attack) 09/28/2021  ? Healthcare maintenance 09/18/2021  ? Macular degeneration 09/18/2021  ? Essential hypertension 09/18/2021  ? Advance care planning 07/21/2020  ? B12 deficiency 07/20/2020  ? Primary osteoarthritis of left knee 09/10/2018  ? Acquired hypothyroidism 03/22/2018  ? Ovarian cyst, left 07/09/2014  ? Weight gain 03/18/2012  ? Family history of osteoporosis 03/18/2012  ? Aortic stenosis, moderate 03/20/2011  ? Dyslipidemia 03/20/2011  ? Controlled type 2 diabetes mellitus without complication, without long-term current use of insulin (HCC) 03/20/2011  ? 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 ?PCP:  Joaquim Nam, MD ?Pharmacy:   ?[facility] [place] [place] - [address] ?[address] ?[place] [address] ?Phone: [phone] Fax: [phone] ? ? ? ? ?Social Determinants of Health (SDOH) Interventions ?  ? ?Readmission Risk Interventions ?   ? View : No data to display.  ?  ?  ?  ? ? ? ?

## 2021-09-29 NOTE — Plan of Care (Signed)
Pt has been educated and all questions answered. Packet given to pt and family. ? ?Problem: Education: ?Goal: Knowledge of disease or condition will improve ?Outcome: Adequate for Discharge ?Goal: Knowledge of secondary prevention will improve (SELECT ALL) ?Outcome: Adequate for Discharge ?Goal: Knowledge of patient specific risk factors will improve (INDIVIDUALIZE FOR PATIENT) ?Outcome: Adequate for Discharge ?  ?

## 2021-09-29 NOTE — Progress Notes (Signed)
?  Echocardiogram ?2D Echocardiogram has been performed. ? ?Gerda Diss ?09/29/2021, 4:12 PM ?

## 2021-09-29 NOTE — Progress Notes (Addendum)
STROKE TEAM PROGRESS NOTE  ? ?ATTENDING NOTE: ?I reviewed above note and agree with the assessment and plan. Pt was seen and examined.  ? ?81 year old female with history of hypertension, hyperlipidemia, diabetes, B12 deficiency, aortic stenosis, macular degeneration admitted for episode of visual disturbance, speech difficulty.  Symptom lasted about 45 minutes to 1 hour and resolved.  Currently symptom-free.  CT negative.  MRI, MRA head and neck unremarkable.  2D echo pending, EEG normal.  A1c 6.2, LDL 74.  Creatinine 0.80. ? ?Per patient, she has episodes of left visual field zigzag lines moving across her visual field since last 15 to 20 years.  Each episode about 5 minutes and happens 2-3 times a year without headache.  This time she again had similar visual episode, about 15 minutes later, she had difficulty reading the letter and difficulty talking with her daughter over the phone. ? 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  However, she does have stroke risk factors with hypertension, hyperlipidemia, diabetes, TIA cannot be completely resolved.  Recommend aspirin and the Plavix for 3 weeks and then back to aspirin alone.  Will not put her on long-term Plavix since she will have frequent eye injection for the macular degeneration.  I did discuss with her ophthalmologist Dr. Vanessa Barbara and he is OK with DAPT treatment as pt will have her next injection tomorrow.  Continue Lipitor 40.  She will continue follow-up with Dr. Vanessa Barbara and also will set up appointment with Dr. Lucia Gaskins at [facility].  ? ?For detailed assessment and plan, please refer to above as I have made changes wherever appropriate.  ? ?I had long discussion with patient, her husband and daughter at bedside, updated pt current condition, treatment plan and potential prognosis, and answered all the questions.  They expressed understanding and appreciation.  ? ?Neurology will sign  off. Please call with questions. Pt will follow up with Dr. Lucia Gaskins at [facility] in about 4 weeks. Thanks for the consult. ? ? ?Marvel Plan, MD PhD ?Stroke Neurology ?09/29/2021 ?4:58 PM ? ? ? ?INTERVAL HISTORY ?Her daughter and husband are at the bedside.  Patient was reclined, awake, alert, in no distress.  ?09/28/2021 patient was home when she a "zigzag" appeared in her vision, that she attributed as a floater, in her macular (right) eye. Once visual disturbance resolved after about 10 mins, she went to read a letter but experienced alexia. Daughter called right at the time and noted speech perseveration "something's not right" and word retrieval/scanning speech. Speech disturbance lasted about to 60 mins. Patient denied memory loss, LOC, tongue biting, bladder or bowel incontinence. Also denied headache, nausea, vomiting. Denied weakness, numbness/tingling.  ?Patient reported recurrence of "zigzag" in her vision, with onset about 15-20 years ago, with frequency of 2-3/year, average duration 10-15mins. During each episode she denies headache or any other symptoms. She denied ever having speech difficulty like this with prior episodes.  ?She has macular degeneration in her right eye for which she sees retinal specialist Dr. Vanessa Barbara for monthly eye injections, next appointment 09/30/2021. Discussed with Dr. Vanessa Barbara suspicion for ocular migraine but plan to treat as TIA with DAPT then asa 81 monotherapy. Dr. Vanessa Barbara was reassuring with DAPT therapy and monthly eye shots. Also plan to have patient follow-up with migraine specialist Dr.Ahern at [facility] after.  ? ? ?Vitals:  ? 09/28/21 2338 09/29/21 0356 09/29/21 0923 09/29/21 1222  ?BP: (!) 169/61 (!) 145/51 (!) 150/81 (!) 173/51  ?  Pulse: 73 79 76 73  ?Resp: 16 18 20 16   ?Temp: 97.7 ?F (36.5 ?C) 98 ?F (36.7 ?C) 98.2 ?F (36.8 ?C) 98.3 ?F (36.8 ?C)  ?TempSrc: Oral Oral Oral Oral  ?SpO2: 97% 98% 97% 100%  ?Weight:      ?Height:      ? ?CBC:  ?Recent Labs  ?Lab 09/28/21 ?1200  09/28/21 ?1217  ?WBC 8.1  --   ?NEUTROABS 6.1  --   ?HGB 11.5* 11.2*  ?HCT 34.4* 33.0*  ?MCV 87.8  --   ?PLT 388  --   ? ?Basic Metabolic Panel:  ?Recent Labs  ?Lab 09/28/21 ?1200 09/28/21 ?1217  ?NA 142 142  ?K 3.9 4.0  ?CL 108 103  ?CO2 26  --   ?GLUCOSE 141* 135*  ?BUN 15 16  ?CREATININE 0.83 0.80  ?CALCIUM 9.3  --   ? ?Lipid Panel:  ?Recent Labs  ?Lab 09/29/21 ?0102  ?CHOL 126  ?TRIG 106  ?HDL 31*  ?CHOLHDL 4.1  ?VLDL 21  ?LDLCALC 74  ? ?HgbA1c: No results for input(s): HGBA1C in the last 168 hours. ?Urine Drug Screen: No results for input(s): LABOPIA, COCAINSCRNUR, LABBENZ, AMPHETMU, THCU, LABBARB in the last 168 hours.  ?Alcohol Level No results for input(s): ETH in the last 168 hours. ? ?IMAGING past 24 hours ?CT ANGIO HEAD NECK W WO CM ? ?Result Date: 09/28/2021 ?CLINICAL DATA:  Transient speech difficulty EXAM: CT ANGIOGRAPHY HEAD AND NECK TECHNIQUE: Multidetector CT imaging of the head and neck was performed using the standard protocol during bolus administration of intravenous contrast. Multiplanar CT image reconstructions and MIPs were obtained to evaluate the vascular anatomy. Carotid stenosis measurements (when applicable) are obtained utilizing NASCET criteria, using the distal internal carotid diameter as the denominator. RADIATION DOSE REDUCTION: This exam was performed according to the departmental dose-optimization program which includes automated exposure control, adjustment of the mA and/or kV according to patient size and/or use of iterative reconstruction technique. CONTRAST:  21mL OMNIPAQUE IOHEXOL 350 MG/ML SOLN COMPARISON:  None. FINDINGS: CTA NECK FINDINGS SKELETON: There is no bony spinal canal stenosis. No lytic or blastic lesion. OTHER NECK: Normal pharynx, larynx and major salivary glands. No cervical lymphadenopathy. Unremarkable thyroid gland. UPPER CHEST: No pneumothorax or pleural effusion. No nodules or masses. AORTIC ARCH: There is no calcific atherosclerosis of the aortic arch.  There is no aneurysm, dissection or hemodynamically significant stenosis of the visualized portion of the aorta. Conventional 3 vessel aortic branching pattern. The visualized proximal subclavian arteries are widely patent. RIGHT CAROTID SYSTEM: Normal without aneurysm, dissection or stenosis. LEFT CAROTID SYSTEM: Normal without aneurysm, dissection or stenosis. VERTEBRAL ARTERIES: Left dominant configuration. Both origins are clearly patent. There is no dissection, occlusion or flow-limiting stenosis to the skull base (V1-V3 segments). CTA HEAD FINDINGS POSTERIOR CIRCULATION: --Vertebral arteries: Normal V4 segments. --Inferior cerebellar arteries: Normal. --Basilar artery: Normal. --Superior cerebellar arteries: Normal. --Posterior cerebral arteries (PCA): Normal. ANTERIOR CIRCULATION: --Intracranial internal carotid arteries: Normal. --Anterior cerebral arteries (ACA): Normal. Both A1 segments are present. Patent anterior communicating artery (a-comm). --Middle cerebral arteries (MCA): Normal. VENOUS SINUSES: As permitted by contrast timing, patent. ANATOMIC VARIANTS: None Review of the MIP images confirms the above findings. IMPRESSION: No emergent large vessel occlusion or high-grade stenosis of the intracranial or cervical arteries. Electronically Signed   By: 70m M.D.   On: 09/28/2021 22:00  ? ?MR BRAIN WO CONTRAST ? ?Result Date: 09/28/2021 ?CLINICAL DATA:  TIA.  Confusion yesterday. EXAM: MRI HEAD WITHOUT CONTRAST TECHNIQUE: Multiplanar,  multiecho pulse sequences of the brain and surrounding structures were obtained without intravenous contrast. COMPARISON:  CT head 09/28/2021 FINDINGS: Brain: No acute infarction, hemorrhage, hydrocephalus, extra-axial collection or mass lesion. Mild atrophy. Mild white matter changes with scattered small white matter hyperintensities bilaterally. Vascular: Normal arterial flow voids at the skull base. Skull and upper cervical spine: Negative Sinuses/Orbits:  Contracted left maxillary sinus which is fully opacified. Remaining sinuses clear. No orbital lesion. Other: None IMPRESSION: No acute infarct identified Mild atrophy and mild chronic microvascular ischemic change i

## 2021-09-29 NOTE — Discharge Summary (Signed)
?Physician Discharge Summary ?  ?Patient: Dana Sandoval MRN: [id number] DOB: May 28, 1941  ?Admit date:     09/28/2021  ?Discharge date: 09/29/21  ?Discharge Physician: Alberteen SamChristopher P Shakirra Buehler  ? ?PCP: Joaquim Namuncan, Graham S, MD  ? ?Recommendations at discharge:  ?Follow up with PCP Dr. Cheree DittoGraham in 1 week for BP management ?Dr. Cheree DittoGraham: Please also note low B12 level ?Follow up with Neurology Dr. Lucia GaskinsAhern in 4-6 weeks for TIA vs. Complex migraine follow up ? ? ? ? ? ?Discharge Diagnoses: ?Principal Problem: ?  TIA (transient ischemic attack) ?Active Problems: ?  Aortic stenosis, moderate ?  Controlled type 2 diabetes mellitus without complication, without long-term current use of insulin (HCC) ?  Acquired hypothyroidism ?  B12 deficiency ?  Essential hypertension ?  Obesity (BMI 30-39.9) ? ?  ? ? ? ?Hospital Course: ?Dana Sandoval is an 81 y.o F with NIDDM, HTN who presented with transient speech disturbance. ? ?The patient was in her usual state of health until the day before admission, was reading a letter and had trouble reading it.  There is some conflicting report about whether it was poorly written or she had some visual disturbance.  Subsequently was talking to her daughter on the phone and sounded confused and perhaps dysarthric.  Patient downplays this.  ? ?The next day, family prevailed on her to be evaluated.  In the ER, BP 145/60, CTH normal.  Hemogram normal, electrolytes and renal function normal.  Discussed with Neuro who recommended TIA work up. ? ? ? ? ? ? ?Assessment and Plan: ?* TIA (transient ischemic attack) ?Suspected TIA.  Differential also includes complex or ocular migraine per Neurology, who evaluated the pateint here. ? ?Angiography of the head and neck were normal.  Echo showed known AS, no intracardiac source of embolism.  Carotid imaging unremarkable.  Lipids near 70 on home statin.  A1c recently measured <7% on metformin. ? ?Atrial fibrillation not on telemetry or ECG.   ? ?Neurology recommended  aspirin and Plavix for 3 weeks then aspirin alone indefinitely.  Neurology follow up was arranged. ? ? ? ?B12 deficiency ?B12 low.   ?- Follow up with PCP for IM B12 injections if appropriate ? ?  ?  ? ? ? ? ?  ? ?Pain control - [employer] Controlled Substance Reporting System database was reviewed.  ? ? ?Consultants: Neurology ?Procedures performed: CT head, MRI brain, CTA head and neck, EEG, echocardiogram  ?Disposition: Home ?Diet recommendation: Cardiac ? ?DISCHARGE MEDICATION: ?Allergies as of 09/29/2021   ? ?   Reactions  ? Codeine Itching  ? After 3 days  ? Dilaudid [hydromorphone Hcl] Itching  ? After 3 days  ? ?  ? ?  ?Medication List  ?  ? ?TAKE these medications   ? ?Accu-Chek Aviva Plus test strip ?Generic drug: glucose blood ?Use as directed to check BS daily. Dx: E11.9 ?  ?Aflibercept 2 MG/0.05ML Soln ?2 mg by Intravitreal route every 30 (thirty) days. ?  ?aspirin EC 81 MG tablet ?Take 1 tablet (81 mg total) by mouth every other day. ?  ?atorvastatin 40 MG tablet ?Commonly known as: LIPITOR ?TAKE 1 TABLET BY MOUTH ONCE A DAY AT 6PM ?What changed:  ?how much to take ?how to take this ?when to take this ?additional instructions ?  ?clopidogrel 75 MG tablet ?Commonly known as: Plavix ?Take 1 tablet (75 mg total) by mouth daily for 21 days. ?  ?levothyroxine 88 MCG tablet ?Commonly known as: SYNTHROID ?Take 1 tablet (88  mcg total) by mouth daily. ?  ?metFORMIN 500 MG tablet ?Commonly known as: GLUCOPHAGE ?Take 1 tablet (500 mg total) by mouth 2 (two) times daily with a meal. ?  ?PRESERVISION AREDS 2 PO ?Take 1 tablet by mouth 2 (two) times daily. ?  ?ramipril 10 MG capsule ?Commonly known as: ALTACE ?TAKE 1 CAPSULE BY MOUTH ONCE DAILY ?  ?TRIPLE ANTIBIOTIC EX ?Apply 1 application. topically daily as needed (pain). ?  ?valACYclovir 500 MG tablet ?Commonly known as: VALTREX ?Take 500 mg by mouth daily as needed (fever blister). ?  ?vitamin B-12 1000 MCG tablet ?Commonly known as: CYANOCOBALAMIN ?Take 1  tablet (1,000 mcg total) by mouth daily. ?  ?VITAMIN D PO ?Take 1,000 Units by mouth daily. ?  ? ?  ? ? Follow-up Information   ? ? Anson Fret, MD. Schedule an appointment as soon as possible for a visit in 1 month(s).   ?Specialty: Neurology ?Contact information: ?[address] ?[address] ?[place] [address] ?[phone] ? ? ?  ?  ? ? Joaquim Nam, MD. Schedule an appointment as soon as possible for a visit in 1 week(s).   ?Specialty: Family Medicine ?Contact information: ?[address] [place] ?[place] [address] ?[phone] ? ? ?  ?  ? ?  ?  ? ?  ? ?Discharge Instructions   ? ? Ambulatory referral to Neurology   Complete by: As directed ?  ? New referral to Dr. Lucia Gaskins for migraine vs. TIA. Thanks.  ? Discharge instructions   Complete by: As directed ?  ? From Dr. Maryfrances Bunnell: ?You were admitted for an episode ?You underwent a CT of the head, and CT angiogram (blood vessel picture) of the head and neck, an MRI of the brain, an echocardiogram (ultrasound of the heart), and an EEG (electroencephalogram to look for seizures). ? ?These were reassuring. ? ?Dr. Roda Shutters believes this may have been a TIA, for that reason, you should continue your aspirin and cholesterol medicine (atorvastatin).  ?Also take clopidogrel/Plavix 75 mg daily for 3 weeks then stop it and take the aspirin alone. ?Plavix is a "superaspirin" ?Take it with aspirin for that three weeks ? ?And continue your blood pressure medicine ramipril 10 mg daily and follow up with Dr> Cheree Ditto in 1 week ? ?Because the episode may also have been a complex migraine, we recommend you follow up with the Neurologist Dr. Lucia Gaskins in particular  ? Increase activity slowly   Complete by: As directed ?  ? ?  ? ? ? ?Discharge Exam: ?Filed Weights  ? 09/28/21 1142  ?Weight: 77.1 kg  ? ?General: Pt is alert, awake, not in acute distress ?Cardiovascular: RRR, nl S1-S2, no murmurs appreciated.   No LE edema.   ?Respiratory: Normal respiratory rate and rhythm.  CTAB without  rales or wheezes. ?Abdominal: Abdomen soft and non-tender.  No distension or HSM.   ?Neuro/Psych: Strength symmetric in upper and lower extremities.  Judgment and insight appear normal. ? ? ?Condition at discharge: good ? ?The results of significant diagnostics from this hospitalization (including imaging, microbiology, ancillary and laboratory) are listed below for reference.  ? ?Imaging Studies: ?CT ANGIO HEAD NECK W WO CM ? ?Result Date: 09/28/2021 ?CLINICAL DATA:  Transient speech difficulty EXAM: CT ANGIOGRAPHY HEAD AND NECK TECHNIQUE: Multidetector CT imaging of the head and neck was performed using the standard protocol during bolus administration of intravenous contrast. Multiplanar CT image reconstructions and MIPs were obtained to evaluate the vascular anatomy. Carotid stenosis measurements (  when applicable) are obtained utilizing NASCET criteria, using the distal internal carotid diameter as the denominator. RADIATION DOSE REDUCTION: This exam was performed according to the departmental dose-optimization program which includes automated exposure control, adjustment of the mA and/or kV according to patient size and/or use of iterative reconstruction technique. CONTRAST:  34mL OMNIPAQUE IOHEXOL 350 MG/ML SOLN COMPARISON:  None. FINDINGS: CTA NECK FINDINGS SKELETON: There is no bony spinal canal stenosis. No lytic or blastic lesion. OTHER NECK: Normal pharynx, larynx and major salivary glands. No cervical lymphadenopathy. Unremarkable thyroid gland. UPPER CHEST: No pneumothorax or pleural effusion. No nodules or masses. AORTIC ARCH: There is no calcific atherosclerosis of the aortic arch. There is no aneurysm, dissection or hemodynamically significant stenosis of the visualized portion of the aorta. Conventional 3 vessel aortic branching pattern. The visualized proximal subclavian arteries are widely patent. RIGHT CAROTID SYSTEM: Normal without aneurysm, dissection or stenosis. LEFT CAROTID SYSTEM: Normal  without aneurysm, dissection or stenosis. VERTEBRAL ARTERIES: Left dominant configuration. Both origins are clearly patent. There is no dissection, occlusion or flow-limiting stenosis to the skull base (V1-V3 segm

## 2021-09-29 NOTE — Care Management Obs Status (Signed)
MEDICARE OBSERVATION STATUS NOTIFICATION ? ? ?Patient Details  ?Name: Dana Sandoval ?MRN: [id number] ?Date of Birth: [birth year] ? ? ?Medicare Observation Status Notification Given:  Yes ? ? ? ?Kermit Balo, RN ?09/29/2021, 4:01 PM ?

## 2021-09-29 NOTE — Assessment & Plan Note (Signed)
BMI 31 °

## 2021-09-29 NOTE — Evaluation (Signed)
Physical Therapy Evaluation ?Patient Details ?Name: Dana Sandoval ?MRN: [id number] ?DOB: 03/19/1941 ?Today's Date: 09/29/2021 ? ?History of Present Illness ? pt is an 81 y/o female admitted 3/22 with transient speech disturbance.  PMHx NIDDM, HTN  ?Clinical Impression ? Pt is at or close to baseline functioning and should be safe at home independently. There are no further acute PT needs.  Will sign off at this time. ?   ?   ? ?Recommendations for follow up therapy are one component of a multi-disciplinary discharge planning process, led by the attending physician.  Recommendations may be updated based on patient status, additional functional criteria and insurance authorization. ? ?Follow Up Recommendations No PT follow up ? ?  ?Assistance Recommended at Discharge PRN  ?Patient can return home with the following ?   ? ?  ?Equipment Recommendations None recommended by PT  ?Recommendations for Other Services ?    ?  ?Functional Status Assessment    ? ?  ?Precautions / Restrictions Precautions ?Precautions: Fall  ? ?  ? ?Mobility ? Bed Mobility ?Overal bed mobility: Modified Independent ?  ?  ?  ?  ?  ?  ?  ?  ? ?Transfers ?Overall transfer level: Modified independent ?  ?  ?  ?  ?  ?  ?  ?  ?  ?  ? ?Ambulation/Gait ?Ambulation/Gait assistance: Modified independent (Device/Increase time) ?Gait Distance (Feet): 250 Feet ?Assistive device: None ?Gait Pattern/deviations: Step-through pattern ?  ?Gait velocity interpretation: >2.62 ft/sec, indicative of community ambulatory ?  ?General Gait Details: steady gait with ability to change speeds, scan and turn abruptly without deviation. ? ?Stairs ?Stairs: Yes ?Stairs assistance: Modified independent (Device/Increase time) ?Stair Management: One rail Right, One rail Left, Two rails, Alternating pattern, Forwards ?Number of Stairs: 5 ?General stair comments: safe with the rail ? ?Wheelchair Mobility ?  ? ?Modified Rankin (Stroke Patients Only) ?Modified Rankin (Stroke Patients  Only) ?Pre-Morbid Rankin Score: No symptoms ?Modified Rankin: No symptoms ? ?  ? ?Balance Overall balance assessment: Modified Independent ?  ?  ?  ?  ?  ?  ?  ?  ?  ?  ?  ?  ?  ?  ?  ?Standardized Balance Assessment ?Standardized Balance Assessment : Berg Balance Test ?Sharlene Motts Balance Test ?Sit to Stand: Able to stand without using hands and stabilize independently ?Standing Unsupported: Able to stand safely 2 minutes ?Sitting with Back Unsupported but Feet Supported on Floor or Stool: Able to sit safely and securely 2 minutes ?Stand to Sit: Sits safely with minimal use of hands ?Transfers: Able to transfer safely, minor use of hands ?Standing Unsupported with Eyes Closed: Able to stand 10 seconds with supervision ?Standing Ubsupported with Feet Together: Able to place feet together independently and stand for 1 minute with supervision ?From Standing, Reach Forward with Outstretched Arm: Can reach forward >12 cm safely (5") ?From Standing Position, Pick up Object from Floor: Able to pick up shoe, needs supervision ?From Standing Position, Turn to Look Behind Over each Shoulder: Looks behind one side only/other side shows less weight shift ?Turn 360 Degrees: Able to turn 360 degrees safely in 4 seconds or less ?Standing Unsupported, Alternately Place Feet on Step/Stool: Able to stand independently and safely and complete 8 steps in 20 seconds ?Standing Unsupported, One Foot in Front: Able to plae foot ahead of the other independently and hold 30 seconds ?Standing on One Leg: Tries to lift leg/unable to hold 3 seconds but remains standing independently ?Total  Score: 47 ?  ?   ? ? ? ?Pertinent Vitals/Pain Pain Assessment ?Pain Assessment: No/denies pain  ? ? ?Home Living Family/patient expects to be discharged to:: Private residence ?Living Arrangements: Spouse/significant other ?Available Help at Discharge: Family;Available 24 hours/day ?Type of Home: House ?Home Access: Stairs to enter ?Entrance Stairs-Rails:  Left ?Entrance Stairs-Number of Steps: several ?Alternate Level Stairs-Number of Steps: flight ?Home Layout: Two level;Able to live on main level with bedroom/bathroom ?Home Equipment: Agricultural consultant (2 wheels);Rollator (4 wheels);Cane - single point;BSC/3in1;Shower seat ?   ?  ?Prior Function Prior Level of Function : Independent/Modified Independent;Other (comment) (doesn't drive now due to macular degeneration) ?  ?  ?  ?  ?  ?  ?  ?  ?  ? ? ?Hand Dominance  ?   ? ?  ?Extremity/Trunk Assessment  ? Upper Extremity Assessment ?Upper Extremity Assessment: Overall WFL for tasks assessed ?  ? ?Lower Extremity Assessment ?Lower Extremity Assessment: Overall WFL for tasks assessed ?  ? ?Cervical / Trunk Assessment ?Cervical / Trunk Assessment: Normal  ?Communication  ? Communication: No difficulties  ?Cognition Arousal/Alertness: Awake/alert ?Behavior During Therapy: [facility] for tasks assessed/performed ?Overall Cognitive Status: Within Functional Limits for tasks assessed ?  ?  ?  ?  ?  ?  ?  ?  ?  ?  ?  ?  ?  ?  ?  ?  ?  ?  ?  ? ?  ?General Comments   ? ?  ?Exercises    ? ?Assessment/Plan  ?  ?PT Assessment Patient does not need any further PT services  ?PT Problem List   ? ?   ?  ?PT Treatment Interventions     ? ?PT Goals (Current goals can be found in the Care Plan section)  ?Acute Rehab PT Goals ?PT Goal Formulation: All assessment and education complete, DC therapy ? ?  ?Frequency   ?  ? ? ?Co-evaluation   ?  ?  ?  ?  ? ? ?  ?AM-PAC PT "6 Clicks" Mobility  ?Outcome Measure Help needed turning from your back to your side while in a flat bed without using bedrails?: None ?Help needed moving from lying on your back to sitting on the side of a flat bed without using bedrails?: None ?Help needed moving to and from a bed to a chair (including a wheelchair)?: None ?Help needed standing up from a chair using your arms (e.g., wheelchair or bedside chair)?: None ?Help needed to walk in hospital room?: None ?Help needed  climbing 3-5 steps with a railing? : None ?6 Click Score: 24 ? ?  ?End of Session   ?Activity Tolerance: Patient tolerated treatment well ?Patient left: Other (comment) (with OT) ?Nurse Communication: Mobility status ?PT Visit Diagnosis: Other abnormalities of gait and mobility (R26.89) ?  ? ?Time: 7121-9758 ?PT Time Calculation (min) (ACUTE ONLY): 22 min ? ? ?Charges:   PT Evaluation ?$PT Eval Low Complexity: 1 Low ?  ?  ?   ? ? ?09/29/2021 ? ?Jacinto Halim., PT ?Acute Rehabilitation Services ?[phone]  (pager) ?[phone]  (office) ? ?Eliseo Gum Jax Abdelrahman ?09/29/2021, 11:08 AM ? ?

## 2021-09-29 NOTE — Procedures (Signed)
Patient Name: Dana Sandoval  ?MRN: [id number]  ?Epilepsy Attending: Charlsie Quest  ?Referring Physician/Provider: Marvel Plan, MD ?Date: 09/29/2021 ?Duration: 22.45 mins ? ?Patient history: 81 year old female presenting one day after a transient episode of confusion with word finding difficulty and alexia.  EEG to evaluate for seizure. ? ?Level of alertness: Awake, asleep ? ?AEDs during EEG study: None ? ?Technical aspects: This EEG study was done with scalp electrodes positioned according to the 10-20 International system of electrode placement. Electrical activity was acquired at a sampling rate of 500Hz  and reviewed with a high frequency filter of 70Hz  and a low frequency filter of 1Hz . EEG data were recorded continuously and digitally stored.  ? ?Description: The posterior dominant rhythm consists of 8 Hz activity of moderate voltage (25-35 uV) seen predominantly in posterior head regions, symmetric and reactive to eye opening and eye closing. Sleep was characterized by vertex waves, sleep spindles (12 to 14 Hz), maximal frontocentral region. Hyperventilation and photic stimulation were not performed.    ? ?IMPRESSION: ?This study is within normal limits. No seizures or epileptiform discharges were seen throughout the recording. ? ?  ? ?

## 2021-09-29 NOTE — Plan of Care (Signed)
°  Problem: Education: °Goal: Knowledge of disease or condition will improve °Outcome: Adequate for Discharge °Goal: Knowledge of secondary prevention will improve (SELECT ALL) °Outcome: Adequate for Discharge °Goal: Knowledge of patient specific risk factors will improve (INDIVIDUALIZE FOR PATIENT) °Outcome: Adequate for Discharge °  °

## 2021-09-29 NOTE — Evaluation (Signed)
Occupational Therapy Evaluation ?Patient Details ?Name: Dana Sandoval ?MRN: [id number] ?DOB: 05/29/1941 ?Today's Date: 09/29/2021 ? ? ?History of Present Illness pt is an 81 y/o female admitted 3/22 with transient speech disturbance.  PMHx NIDDM, HTN  ? ?Clinical Impression ?  ?Pt admitted for concerns listed above. PTA pt reported that she was independent with all ADL's and IADL's (besides driving). At this time pt presents near her baseline, able to complete BADL's and functional mobility with no AD or assist. Pt's vision and cognition are [facility]. She has no further OT needs and acute OT will sign off.   ?   ? ?Recommendations for follow up therapy are one component of a multi-disciplinary discharge planning process, led by the attending physician.  Recommendations may be updated based on patient status, additional functional criteria and insurance authorization.  ? ?Follow Up Recommendations ? No OT follow up  ?  ?Assistance Recommended at Discharge None  ?Patient can return home with the following Assistance with cooking/housework ? ?  ?Functional Status Assessment ? Patient has had a recent decline in their functional status and demonstrates the ability to make significant improvements in function in a reasonable and predictable amount of time.  ?Equipment Recommendations ? None recommended by OT  ?  ?Recommendations for Other Services   ? ? ?  ?Precautions / Restrictions Precautions ?Precautions: Fall ?Restrictions ?Weight Bearing Restrictions: No  ? ?  ? ?Mobility Bed Mobility ?Overal bed mobility: Modified Independent ?  ?  ?  ?  ?  ?  ?  ?  ? ?Transfers ?Overall transfer level: Modified independent ?  ?  ?  ?  ?  ?  ?  ?  ?  ?  ? ?  ?Balance Overall balance assessment: Modified Independent ?  ?  ?  ?  ?  ?  ?  ?  ?  ?  ?  ?  ?  ?  ?  ?  ?  ?  ?   ? ?ADL either performed or assessed with clinical judgement  ? ?ADL Overall ADL's : Modified independent;At baseline ?  ?  ?  ?  ?  ?  ?  ?  ?  ?  ?  ?  ?  ?  ?  ?   ?  ?  ?  ?General ADL Comments: Pt able to complete all ADL's and functional mobility with no AD, independently at this time.  ? ? ? ?Vision Baseline Vision/History: 1 Wears glasses;6 Macular Degeneration ?Ability to See in Adequate Light: 0 Adequate ?Patient Visual Report: No change from baseline ?Vision Assessment?: No apparent visual deficits  ?   ?Perception   ?  ?Praxis   ?  ? ?Pertinent Vitals/Pain Pain Assessment ?Pain Assessment: No/denies pain  ? ? ? ?Hand Dominance Right ?  ?Extremity/Trunk Assessment Upper Extremity Assessment ?Upper Extremity Assessment: Overall WFL for tasks assessed ?  ?Lower Extremity Assessment ?Lower Extremity Assessment: Overall WFL for tasks assessed ?  ?Cervical / Trunk Assessment ?Cervical / Trunk Assessment: Normal ?  ?Communication Communication ?Communication: No difficulties ?  ?Cognition Arousal/Alertness: Awake/alert ?Behavior During Therapy: [facility] for tasks assessed/performed ?Overall Cognitive Status: Within Functional Limits for tasks assessed ?  ?  ?  ?  ?  ?  ?  ?  ?  ?  ?  ?  ?  ?  ?  ?  ?  ?  ?  ?General Comments  VSS on RA, husband and daughter in room. ? ?  ?  Exercises   ?  ?Shoulder Instructions    ? ? ?Home Living Family/patient expects to be discharged to:: Private residence ?Living Arrangements: Spouse/significant other ?Available Help at Discharge: Family;Available 24 hours/day ?Type of Home: House ?Home Access: Stairs to enter ?Entrance Stairs-Number of Steps: several ?Entrance Stairs-Rails: Left ?Home Layout: Two level;Able to live on main level with bedroom/bathroom ?Alternate Level Stairs-Number of Steps: flight ?Alternate Level Stairs-Rails: Left;Right ?Bathroom Shower/Tub: Walk-in shower ?  ?Bathroom Toilet: Handicapped height ?Bathroom Accessibility: Yes ?  ?Home Equipment: Agricultural consultant (2 wheels);Rollator (4 wheels);Cane - single point;BSC/3in1;Shower seat ?  ?  ?  ? ?  ?Prior Functioning/Environment Prior Level of Function : Independent/Modified  Independent;Other (comment) (doesn't drive now due to macular degeneration) ?  ?  ?  ?  ?  ?  ?  ?  ?  ? ?  ?  ?OT Problem List: Decreased strength;Impaired balance (sitting and/or standing);Impaired vision/perception ?  ?   ?OT Treatment/Interventions:    ?  ?OT Goals(Current goals can be found in the care plan section) Acute Rehab OT Goals ?Patient Stated Goal: To go home ?OT Goal Formulation: With patient ?Time For Goal Achievement: 09/29/21 ?Potential to Achieve Goals: Good  ?OT Frequency:   ?  ? ?Co-evaluation   ?  ?  ?  ?  ? ?  ?AM-PAC OT "6 Clicks" Daily Activity     ?Outcome Measure Help from another person eating meals?: None ?Help from another person taking care of personal grooming?: None ?Help from another person toileting, which includes using toliet, bedpan, or urinal?: None ?Help from another person bathing (including washing, rinsing, drying)?: None ?Help from another person to put on and taking off regular upper body clothing?: None ?Help from another person to put on and taking off regular lower body clothing?: None ?6 Click Score: 24 ?  ?End of Session Nurse Communication: Mobility status ? ?Activity Tolerance: Patient tolerated treatment well ?Patient left: in bed;with call bell/phone within reach;with family/visitor present ? ?OT Visit Diagnosis: Unsteadiness on feet (R26.81);Other abnormalities of gait and mobility (R26.89);Muscle weakness (generalized) (M62.81)  ?              ?Time: 5852-7782 ?OT Time Calculation (min): 13 min ?Charges:  OT General Charges ?$OT Visit: 1 Visit ?OT Evaluation ?$OT Eval Moderate Complexity: 1 Mod ? ?Lynita Groseclose H., OTR/L ?Acute Rehabilitation ? ?Nashaun Hillmer Elane Bing Plume ?09/29/2021, 12:23 PM ?

## 2021-09-29 NOTE — Progress Notes (Signed)
EEG complete - results pending 

## 2021-09-30 ENCOUNTER — Encounter (INDEPENDENT_AMBULATORY_CARE_PROVIDER_SITE_OTHER): Payer: Self-pay | Admitting: Ophthalmology

## 2021-09-30 ENCOUNTER — Telehealth: Payer: Self-pay

## 2021-09-30 ENCOUNTER — Other Ambulatory Visit: Payer: Self-pay

## 2021-09-30 ENCOUNTER — Ambulatory Visit (INDEPENDENT_AMBULATORY_CARE_PROVIDER_SITE_OTHER): Payer: Medicare PPO | Admitting: Ophthalmology

## 2021-09-30 DIAGNOSIS — H353122 Nonexudative age-related macular degeneration, left eye, intermediate dry stage: Secondary | ICD-10-CM

## 2021-09-30 DIAGNOSIS — H35033 Hypertensive retinopathy, bilateral: Secondary | ICD-10-CM | POA: Diagnosis not present

## 2021-09-30 DIAGNOSIS — H353211 Exudative age-related macular degeneration, right eye, with active choroidal neovascularization: Secondary | ICD-10-CM

## 2021-09-30 DIAGNOSIS — Z961 Presence of intraocular lens: Secondary | ICD-10-CM | POA: Diagnosis not present

## 2021-09-30 DIAGNOSIS — I10 Essential (primary) hypertension: Secondary | ICD-10-CM | POA: Diagnosis not present

## 2021-09-30 MED ORDER — AFLIBERCEPT 2MG/0.05ML IZ SOLN FOR KALEIDOSCOPE
2.0000 mg | INTRAVITREAL | Status: AC | PRN
  Administered 2021-09-30: 2 mg via INTRAVITREAL

## 2021-09-30 NOTE — Telephone Encounter (Signed)
Called patient and schedule hospital f/u for 10/07/21 at 3pm ?

## 2021-09-30 NOTE — Telephone Encounter (Signed)
Patient called was discharged from hospital last night. Was told to see pcp within a week. Did not see anything open. Let patient know that we will call after Dr. Damita Dunnings reviews and advises when to be seen.  ?

## 2021-09-30 NOTE — Telephone Encounter (Signed)
Can use 3 and 315 appointment on 10/07/21.  Let me know if that will not work.  Thanks.   ?

## 2021-10-07 ENCOUNTER — Ambulatory Visit: Payer: Medicare PPO | Admitting: Family Medicine

## 2021-10-07 ENCOUNTER — Encounter: Payer: Self-pay | Admitting: Family Medicine

## 2021-10-07 DIAGNOSIS — G459 Transient cerebral ischemic attack, unspecified: Secondary | ICD-10-CM | POA: Diagnosis not present

## 2021-10-07 MED ORDER — ASPIRIN EC 81 MG PO TBEC: 81.0000 mg | DELAYED_RELEASE_TABLET | Freq: Every day | ORAL | Status: DC

## 2021-10-07 NOTE — Progress Notes (Signed)
Inpatient follow-up.  She had an episode where she had trouble reading the letter and her language was affected.  Symptoms resolved.  She presented to the emergency room the next day.  Was admitted and work-up done.  Possible TIA vs complex migraine, Ddx d/w pt. she has had no residual or new symptoms in the meantime.  Lower B12 level noted. She has restarted B12 replacement in the meantime.  Discussed rationale for aspirin and Plavix use.  See plan.  No bleeding. ? ?No episodes in the meantime.  No fevers chills nausea vomiting or any neurologic symptoms in the meantime. ? ?Meds, vitals, and allergies reviewed.  ? ?ROS: Per HPI unless specifically indicated in ROS section  ? ?GEN: nad, alert and oriented ?HEENT: ncat ?NECK: supple w/o LA ?CV: rrr.  SEM noted.  ?PULM: ctab, no inc wob ?ABD: soft, +bs ?EXT: no edema ?SKIN: no acute rash ?CN 2-12 wnl B, S/S/DTR wnl  ?

## 2021-10-07 NOTE — Patient Instructions (Addendum)
Since you have restarted B12 in the meantime, we can check your level when we check your A1c this summer.  ?Keep the neurology appointment.  ?Take care.  Glad to see you. ?Continue aspirin and plavix for now then change to daily aspirin after that.   ?

## 2021-10-09 NOTE — Assessment & Plan Note (Signed)
Presumed TIA versus possible complex migraine. ?Since she has restarted B12 in the meantime, we can check her level when we check an A1c this summer.  ?She has neurology follow-up pending. ?Discussed to continue aspirin and plavix for now then change to daily aspirin after that.  She agrees to plan.  Okay for outpatient follow-up. ?

## 2021-10-17 ENCOUNTER — Ambulatory Visit: Payer: Medicare PPO | Admitting: Cardiology

## 2021-10-18 ENCOUNTER — Inpatient Hospital Stay: Payer: Medicare PPO

## 2021-10-18 ENCOUNTER — Encounter: Payer: Self-pay | Admitting: Cardiology

## 2021-10-18 ENCOUNTER — Ambulatory Visit: Payer: Medicare PPO | Admitting: Cardiology

## 2021-10-18 VITALS — BP 137/64 | HR 77 | Temp 98.0°F | Resp 16 | Ht 62.0 in | Wt 168.0 lb

## 2021-10-18 DIAGNOSIS — G459 Transient cerebral ischemic attack, unspecified: Secondary | ICD-10-CM

## 2021-10-18 DIAGNOSIS — I080 Rheumatic disorders of both mitral and aortic valves: Secondary | ICD-10-CM | POA: Diagnosis not present

## 2021-10-18 DIAGNOSIS — E782 Mixed hyperlipidemia: Secondary | ICD-10-CM

## 2021-10-18 DIAGNOSIS — I10 Essential (primary) hypertension: Secondary | ICD-10-CM

## 2021-10-18 NOTE — Progress Notes (Signed)
? ?Primary Physician/Referring:  Joaquim Nam, MD ? ?Patient ID: Dana Sandoval, female    DOB: 1941-01-29, 81 y.o.   MRN: [id number] ? ?Chief Complaint  ?Patient presents with  ? Transient Ischemic Attack  ? Hospitalization Follow-up  ? ?HPI:   ? ?[name]  is a 81 y.o. Caucasian female with history of hyperlipidemia, controlled diabetes, HTN, hypothyroidism, and mild to moderate aortic stenosis and mild to moderate AI, mild to moderate mitral regurgitation, abdominal atherosclerosis and abdominal ectasia. ? ?Patient presented with word finding difficulty and visual disturbance in the form of zigzag lines on the right visual field on 09/29/2021 to the emergency room after the event had lasted for about an hour the previous day and she was completely asymptomatic upon presentation to the emergency room the following day.  She was discharged home after routine testing did not reveal any etiology, symptoms felt to be either TIA versus complex migraine.  She has not had any recurrence since then.  States that she had similar event in the form of visual zigzag lines in 2009 but she did not have any neurologic deficits previously.  Accompanied by her daughter. She is presently doing well and denies any chest pain, shortness of breath, PND or orthopnea.  ? ?Past Medical History:  ?Diagnosis Date  ? Aortic stenosis   ? Arthritis   ? B12 deficiency   ? Diabetes mellitus   ? type 2  ? Heart murmur   ? aortic stenosis  ? Hyperlipidemia   ? Hypothyroidism   ? IBS (irritable bowel syndrome)   ? Macular degeneration   ? NSVD (normal spontaneous vaginal delivery)   ? X 4  ? Pain   ? INTRASCAPULAR PAIN  ? SAB (spontaneous abortion)   ? X 2  ? Thyroid disease   ? HYPOTHYROIDISM  ? ?Past Surgical History:  ?Procedure Laterality Date  ? ABDOMINAL HYSTERECTOMY  1977  ? TAH  (FIBROIDS)  ? CATARACT EXTRACTION Bilateral   ? DILATION AND CURETTAGE OF UTERUS    ? DOPPLER ECHOCARDIOGRAPHY  2009  ? HAD AN ECHOCARDIOGRAM THAT  SHOWED MODERATE MITRAL  ANNULAR CALCIFICATION AND A NORMAL EJECTION FRACTION OF 55%  ? EYE SURGERY Bilateral   ? Cat Sx  ? OTHER SURGICAL HISTORY    ? HYSTERECTOMY  ? SHOULDER SURGERY    ? RIGHT SHOULDER SURGERY  ? THYROIDECTOMY    ? 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  ? RIGHT  ? TOTAL KNEE ARTHROPLASTY Left 09/10/2018  ? Procedure: TOTAL KNEE ARTHROPLASTY;  Surgeon: Marcene Corning, MD;  Location: [facility];  Service: Orthopedics;  Laterality: Left;  ? ?Social History  ? ?Tobacco Use  ? Smoking status: Never  ? Smokeless tobacco: Never  ?Substance Use Topics  ? Alcohol use: No  ?  Alcohol/week: 0.0 standard drinks  ? Marital Status: Married  ? ?ROS  ?Review of Systems  ?Cardiovascular:  Negative for chest pain, dyspnea on exertion and leg swelling.  ?Musculoskeletal:  Positive for arthritis.  ?Gastrointestinal:  Negative for melena.  ?Objective  ? ? ?  10/18/2021  ?  2:48 PM 10/07/2021  ?  3:10 PM 09/29/2021  ?  5:10 PM  ?Vitals with BMI  ?Height 5\' 2"  5\' 2"    ?Weight 168 lbs 169 lbs   ?BMI 30.72 30.9   ?Systolic 137 126  ?Diastolic 64 60 76  ?Pulse 77 86 71  ?  ?Blood pressure 137/64, pulse 77, temperature 98 ?F (36.7 ?C), resp. rate 16,  height 5\' 2"  (1.575 m), weight 168 lb (76.2 kg), SpO2 98 %. Body mass index is 30.73 kg/m?. ?  ?Physical Exam ?Constitutional:   ?   Appearance: She is well-developed.  ?Neck:  ?   Vascular: No JVD.  ?Cardiovascular:  ?   Rate and Rhythm: Normal rate and regular rhythm.  ?   Pulses: Normal pulses and intact distal pulses.     ?     Carotid pulses are  on the right side with bruit and  on the left side with bruit. ?   Heart sounds: Murmur heard.  ?Harsh midsystolic murmur is present with a grade of 2/6 at the upper right sternal border radiating to the neck.  ?High-pitched blowing decrescendo early diastolic murmur is present with a grade of 2/4 at the upper right sternal border radiating to the apex.  ?  No gallop.  ?Pulmonary:  ?   Effort: Pulmonary effort is normal.  ?   Breath  sounds: Normal breath sounds.  ?Abdominal:  ?   General: Bowel sounds are normal.  ?   Palpations: Abdomen is soft.  ?Musculoskeletal:  ?   Right lower leg: No edema.  ?   Left lower leg: No edema.  ? ?Radiology: ?No results found. ? ?Laboratory examination:  ? ?Recent Labs  ?  09/08/21 ?0758 09/28/21 ?1200 09/28/21 ?1217  ?NA 141 142 142  ?K 3.9 3.9 4.0  ?CL 104 108 103  ?CO2 29 26  --   ?GLUCOSE 160* 141* 135*  ?BUN 17 15 16   ?CREATININE 0.88 0.83 0.80  ?CALCIUM 8.9 9.3  --   ?GFRNONAA  --  >60  --   ? ? ? ?  Latest Ref Rng & Units 09/28/2021  ? 12:17 PM 09/28/2021  ? 12:00 PM 09/08/2021  ?  7:58 AM  ?CMP  ?Glucose 70 - 99 mg/dL 09/30/2021   11/08/2021   539    ?BUN 8 - 23 mg/dL 16   15   17     ?Creatinine 0.44 - 1.00 mg/dL 767   341      ?Sodium 135 - 145 mmol/L 142   142   141    ?Potassium 3.5 - 5.1 mmol/L 4.0   3.9   3.9    ?Chloride 98 - 111 mmol/L 103   108   104    ?CO2 22 - 32 mmol/L  26   29    ?Calcium 8.9 - 10.3 mg/dL  9.3   8.9    ?Total Protein 6.5 - 8.1 g/dL  6.1   6.4    ?Total Bilirubin 0.3 - 1.2 mg/dL  0.6   0.5    ?Alkaline Phos 38 - 126 U/L  54   67    ?AST 15 - 41 U/L  19   15    ?ALT 0 - 44 U/L  13   10    ? ? ?  Latest Ref Rng & Units 09/28/2021  ? 12:17 PM 09/28/2021  ? 12:00 PM 03/08/2020  ?  8:19 AM  ?CBC  ?WBC 4.0 - 10.5 K/uL  8.1   12.2    ?Hemoglobin 12.0 - 15.0 g/dL 09/30/2021   09/30/2021   03/10/2020    ?Hematocrit 36.0 - 46.0 % 33.0   34.4   38.1    ?Platelets 150 - 400 K/uL  388   458.0    ? ?Lipid Panel  ?   ?Component Value Date/Time  ? CHOL 126 09/29/2021 0102  ?  TRIG 106 09/29/2021 0102  ? HDL 31 (L) 09/29/2021 0102  ? CHOLHDL 4.1 09/29/2021 0102  ? VLDL 21 09/29/2021 0102  ? LDLCALC 74 09/29/2021 0102  ?  ? ?HEMOGLOBIN A1C ?Lab Results  ?Component Value Date  ? HGBA1C 6.2 09/08/2021  ? MPG 120 (H) 07/13/2010  ? ?TSH ?Recent Labs  ?  09/08/21 ?0758  ?TSH 1.24  ? ?Medications  ? ?Allergies  ?Allergen Reactions  ? Codeine Itching  ?  After 3 days  ? Dilaudid [Hydromorphone Hcl] Itching  ?  After 3 days  ?   ? ?Current Outpatient Medications:  ?  aspirin EC 81 MG tablet, Take 1 tablet (81 mg total) by mouth daily., Disp: 30 tablet, Rfl:  ?  atorvastatin (LIPITOR) 40 MG tablet, TAKE 1 TABLET BY MOUTH ONCE A DAY AT 6PM, Disp: 90 tablet, Rfl: 3 ?  clopidogrel (PLAVIX) 75 MG tablet, Take 1 tablet (75 mg total) by mouth daily for 21 days., Disp: 21 tablet, Rfl: 0 ?  levothyroxine (SYNTHROID) 88 MCG tablet, Take 1 tablet (88 mcg total) by mouth daily., Disp: 90 tablet, Rfl: 3 ?  metFORMIN (GLUCOPHAGE) 500 MG tablet, Take 1 tablet (500 mg total) by mouth 2 (two) times daily with a meal., Disp: 180 tablet, Rfl: 3 ?  Multiple Vitamins-Minerals (PRESERVISION AREDS 2 PO), Take 1 tablet by mouth 2 (two) times daily. , Disp: , Rfl:  ?  ramipril (ALTACE) 10 MG capsule, TAKE 1 CAPSULE BY MOUTH ONCE DAILY, Disp: 90 capsule, Rfl: 3 ?  valACYclovir (VALTREX) 500 MG tablet, Take 500 mg by mouth daily as needed (fever blister)., Disp: , Rfl:  ?  vitamin B-12 (CYANOCOBALAMIN) 1000 MCG tablet, Take 1 tablet (1,000 mcg total) by mouth daily., Disp: , Rfl:  ?  VITAMIN D PO, Take 1,000 Units by mouth daily., Disp: , Rfl:  ?  glucose blood (ACCU-CHEK AVIVA PLUS) test strip, Use as directed to check BS daily. Dx: E11.9, Disp: 100 each, Rfl: 2  ?  ?CT angiogram head and neck 09/28/2021: ?No emergent large vessel occlusion or high-grade stenosis of the ?intracranial or cervical arteries. ? ?MRI of the brain without contrast 09/28/2021: ?Mild atrophy and mild chronic microvascular ischemic change in the ?white matter. ? ?Cardiac Studies:  ? ?Carotid artery duplex  04/03/19  ?No hemodynamically significant arterial disease in the internal carotid artery bilaterally.  ?Antegrade right vertebral artery flow. Antegrade left vertebral artery flow. Compared to 04/01/2018, right ICA stenosis of 15-49% not present.  ? ?Abdominal Aortic Duplex  04/16/2020:  ?Mild plaque observed in the mid aorta. Normal flow velocities noted.  ?Ectasia in the mid aorta  measuring 2.26 x 2.21 x 2.2 cm is seen.  No AAA.  ? ?Echocardiogram 09/29/2021:  ? 1. Left ventricular ejection fraction, by estimation, is 55 to 60%. The left ventricle has normal function. The left ventricle has no r

## 2021-10-25 NOTE — Progress Notes (Signed)
?[facility] Note ? ?10/28/2021 ? ?  ? ?CHIEF COMPLAINT ?Patient presents for Retina Follow Up ? ?HISTORY OF PRESENT ILLNESS: ?Dana Sandoval is a 81 y.o. female who presents to the clinic today for:  ? ?HPI   ? ? 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  Severity is moderate.  Duration of 4 weeks.  Since onset it is stable.  I, the attending physician,  performed the HPI with the patient and updated documentation appropriately. ? ?  ?  ? ? Comments   ?Patient states vision the same OU. ? ?  ?  ?Last edited by Bernarda Caffey, MD on 10/28/2021  8:51 AM.  ?  ?Pt feels like vision is about the same, she states the darkened area in her vision seems worse some days ? ?Referring physician: ?Tonia Ghent, MD ?[address] ?[place],  [place] [id number] ? ?HISTORICAL INFORMATION:  ?Selected notes from the [facility] ?Referred by Dr. Zenia Resides for eval of ret heme OD  ? ?CURRENT MEDICATIONS: ?No current outpatient medications on file. (Ophthalmic Drugs)  ? ?No current facility-administered medications for this visit. (Ophthalmic Drugs)  ? ?Current Outpatient Medications (Other)  ?Medication Sig  ? aspirin EC 81 MG tablet Take 1 tablet (81 mg total) by mouth daily.  ? atorvastatin (LIPITOR) 40 MG tablet TAKE 1 TABLET BY MOUTH ONCE A DAY AT 6PM  ? glucose blood (ACCU-CHEK AVIVA PLUS) test strip Use as directed to check BS daily. Dx: E11.9  ? levothyroxine (SYNTHROID) 88 MCG tablet Take 1 tablet (88 mcg total) by mouth daily.  ? metFORMIN (GLUCOPHAGE) 500 MG tablet Take 1 tablet (500 mg total) by mouth 2 (two) times daily with a meal.  ? Multiple Vitamins-Minerals (PRESERVISION AREDS 2 PO) Take 1 tablet by mouth 2 (two) times daily.   ? ramipril (ALTACE) 10 MG capsule TAKE 1 CAPSULE BY MOUTH ONCE DAILY  ? valACYclovir (VALTREX) 500 MG tablet Take 500 mg by mouth daily as needed (fever blister).  ? vitamin B-12 (CYANOCOBALAMIN) 1000 MCG tablet Take 1 tablet  (1,000 mcg total) by mouth daily.  ? VITAMIN D PO Take 1,000 Units by mouth daily.  ? ?No current facility-administered medications for this visit. (Other)  ? ?REVIEW OF SYSTEMS: ?ROS   ?Positive for: Endocrine, Cardiovascular, Eyes ?Negative for: Constitutional, Gastrointestinal, Neurological, Skin, Genitourinary, Musculoskeletal, HENT, Respiratory, Psychiatric, Allergic/Imm, Heme/Lymph ?Last edited by Roselee Nova D, COT on 10/28/2021  7:44 AM.  ?  ? ?ALLERGIES ?Allergies  ?Allergen Reactions  ? Codeine Itching  ?  After 3 days  ? Dilaudid [Hydromorphone Hcl] Itching  ?  After 3 days  ? ?PAST MEDICAL HISTORY ?Past Medical History:  ?Diagnosis Date  ? Aortic stenosis   ? Arthritis   ? B12 deficiency   ? Diabetes mellitus   ? type 2  ? Heart murmur   ? aortic stenosis  ? Hyperlipidemia   ? Hypothyroidism   ? IBS (irritable bowel syndrome)   ? Macular degeneration   ? NSVD (normal spontaneous vaginal delivery)   ? X 4  ? Pain   ? INTRASCAPULAR PAIN  ? SAB (spontaneous abortion)   ? X 2  ? Thyroid disease   ? HYPOTHYROIDISM  ? ?Past Surgical History:  ?Procedure Laterality Date  ? ABDOMINAL HYSTERECTOMY  1977  ? TAH  (FIBROIDS)  ? CATARACT EXTRACTION Bilateral   ? DILATION AND CURETTAGE OF  UTERUS    ? DOPPLER ECHOCARDIOGRAPHY  2009  ? HAD AN ECHOCARDIOGRAM THAT SHOWED MODERATE MITRAL  ANNULAR CALCIFICATION AND A NORMAL EJECTION FRACTION OF 55%  ? EYE SURGERY Bilateral   ? Cat Sx  ? OTHER SURGICAL HISTORY    ? HYSTERECTOMY  ? SHOULDER SURGERY    ? RIGHT SHOULDER SURGERY  ? THYROIDECTOMY    ? 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  ? RIGHT  ? TOTAL KNEE ARTHROPLASTY Left 09/10/2018  ? Procedure: TOTAL KNEE ARTHROPLASTY;  Surgeon: Melrose Nakayama, MD;  Location: [facility];  Service: Orthopedics;  Laterality: Left;  ? ?FAMILY HISTORY ?Family History  ?Problem Relation Age of Onset  ? Osteoporosis Mother   ? Heart disease Mother   ? Macular degeneration Mother   ? Heart disease Father   ? Cancer Father   ?     BRAIN TUMOR  ?  Diabetes Maternal Aunt   ? Diabetes Maternal Uncle   ? Cancer Brother   ?     BRAIN TUMOR  ? Colon cancer Neg Hx   ? Breast cancer Neg Hx   ? ?SOCIAL HISTORY ?Social History  ? ?Tobacco Use  ? Smoking status: Never  ? Smokeless tobacco: Never  ?Vaping Use  ? Vaping Use: Never used  ?Substance Use Topics  ? Alcohol use: No  ?  Alcohol/week: 0.0 standard drinks  ? Drug use: No  ?  ? ?  ?OPHTHALMIC EXAM: ?Base Eye Exam   ? ? Visual Acuity (Snellen - Linear)   ? ?   Right Left  ? Dist [place] 20/40 20/30 -2  ? Dist ph  NI NI  ? ?  ?  ? ? Tonometry (Tonopen, 7:53 AM)   ? ?   Right Left  ? Pressure 12 16  ? ?  ?  ? ? Pupils   ? ?   Dark Light Shape React APD  ? Right 3 2 Round Brisk None  ? Left 3 2 Round Brisk None  ? ?  ?  ? ? Visual Fields (Counting fingers)   ? ?   Left Right  ?  Full Full  ? ?  ?  ? ? Extraocular Movement   ? ?   Right Left  ?  Full, Ortho Full, Ortho  ? ?  ?  ? ? Neuro/Psych   ? ? Oriented x3: Yes  ? Mood/Affect: Normal  ? ?  ?  ? ? Dilation   ? ? Both eyes: 1.0% Mydriacyl, 2.5% Phenylephrine @ 7:53 AM  ? ?  ?  ? ?  ? ?Slit Lamp and Fundus Exam   ? ? Slit Lamp Exam   ? ?   Right Left  ? Lids/Lashes Dermatochalasis - upper lid Dermatochalasis - upper lid  ? Conjunctiva/Sclera White and quiet White and quiet  ? Cornea 1+ Punctate epithelial erosions, fine endo pigment 1-2+ fine Punctate epithelial erosions, fine endo pigment  ? Anterior Chamber Deep and quiet Deep and quiet  ? Iris Round and dilated Round and dilated  ? Lens Toric PC IOL with marks at 1200 and 0600, 1+ Posterior capsular opacification nasal side approaching visual axis PC IOL in good position, 1-2+ Posterior capsular opacification  ? Anterior Vitreous Vitreous syneresis, trace pigment, Posterior vitreous detachment, vitreous condensations Vitreous syneresis, Posterior vitreous detachment, vitreous condensations  ? ?  ?  ? ? Fundus Exam   ? ?   Right Left  ? Disc mild Pallor, Sharp rim, +cupping, PPA mild  Pallor, Sharp rim, +cupping, PPA   ? C/D Ratio 0.6 0.6  ? Macula Blunted foveal reflex, central CNV with subretinal heme-- stably resolved -- now with pigment ring, RPE mottling, clumping and atrophy, Drusen, shallow SRF overlying CNV -- stably improved, cystic changes temporal mac - persistent Flat, Blunted foveal reflex, Drusen, RPE mottling, clumping and atrophy, No heme or edema  ? Vessels attenuated attenuated  ? Periphery Attached, reticular degeneration, paving stone degeneration IT quad, No heme Attached, reticular degeneration, mild inferior paving stone degeneration, No heme  ? ?  ?  ? ?  ? ?IMAGING AND PROCEDURES  ?Imaging and Procedures for 10/28/2021 ? ?OCT, Retina - OU - Both Eyes   ? ?   ?Right Eye ?Quality was good. Central Foveal Thickness: 320. Progression has been stable. Findings include abnormal foveal contour, subretinal hyper-reflective material, pigment epithelial detachment, retinal drusen , outer retinal atrophy, no SRF, no IRF (Stable improvement in SRF overlying PED, persistent cystic changes / IRF temporal fovea overlying central PED / [facility]).  ? ?Left Eye ?Quality was good. Central Foveal Thickness: 222. Progression has been stable. Findings include normal foveal contour, no IRF, no SRF, outer retinal atrophy, retinal drusen .  ? ?Notes ?*Images captured and stored on drive ? ?Diagnosis / Impression:  ?OD: Stable improvement in SRF overlying PED, persistent cystic changes / IRF temporal fovea overlying central PED / [facility] ?OS: non-exu ARMD  ? ?Clinical management:  ?See below ? ?Abbreviations: NFP - Normal foveal profile. CME - cystoid macular edema. PED - pigment epithelial detachment. IRF - intraretinal fluid. SRF - subretinal fluid. EZ - ellipsoid zone. ERM - epiretinal membrane. ORA - outer retinal atrophy. ORT - outer retinal tubulation. SRHM - subretinal hyper-reflective material. IRHM - intraretinal hyper-reflective material ? ? ?  ? ?Intravitreal Injection, Pharmacologic Agent - OD - Right Eye   ? ?   ?Time  Out ?10/28/2021. 8:19 AM. Confirmed correct patient, procedure, site, and patient consented.  ? ?Anesthesia ?Topical anesthesia was used. Anesthetic medications included Lidocaine 2%, Proparacaine 0.5%.  ? ?Procedur

## 2021-10-28 ENCOUNTER — Ambulatory Visit (INDEPENDENT_AMBULATORY_CARE_PROVIDER_SITE_OTHER): Payer: Medicare PPO | Admitting: Ophthalmology

## 2021-10-28 ENCOUNTER — Encounter (INDEPENDENT_AMBULATORY_CARE_PROVIDER_SITE_OTHER): Payer: Self-pay | Admitting: Ophthalmology

## 2021-10-28 DIAGNOSIS — H35033 Hypertensive retinopathy, bilateral: Secondary | ICD-10-CM

## 2021-10-28 DIAGNOSIS — Z961 Presence of intraocular lens: Secondary | ICD-10-CM

## 2021-10-28 DIAGNOSIS — H353211 Exudative age-related macular degeneration, right eye, with active choroidal neovascularization: Secondary | ICD-10-CM

## 2021-10-28 DIAGNOSIS — H26493 Other secondary cataract, bilateral: Secondary | ICD-10-CM | POA: Diagnosis not present

## 2021-10-28 DIAGNOSIS — H353122 Nonexudative age-related macular degeneration, left eye, intermediate dry stage: Secondary | ICD-10-CM | POA: Diagnosis not present

## 2021-10-28 DIAGNOSIS — I10 Essential (primary) hypertension: Secondary | ICD-10-CM

## 2021-10-28 MED ORDER — AFLIBERCEPT 2MG/0.05ML IZ SOLN FOR KALEIDOSCOPE
2.0000 mg | INTRAVITREAL | Status: AC | PRN
  Administered 2021-10-28: 2 mg via INTRAVITREAL

## 2021-11-08 DIAGNOSIS — G459 Transient cerebral ischemic attack, unspecified: Secondary | ICD-10-CM | POA: Diagnosis not present

## 2021-11-09 ENCOUNTER — Ambulatory Visit: Payer: Medicare PPO | Admitting: Neurology

## 2021-11-09 ENCOUNTER — Encounter: Payer: Self-pay | Admitting: Neurology

## 2021-11-09 VITALS — BP 151/64 | HR 71 | Ht 62.0 in | Wt 167.4 lb

## 2021-11-09 DIAGNOSIS — G43109 Migraine with aura, not intractable, without status migrainosus: Secondary | ICD-10-CM

## 2021-11-09 DIAGNOSIS — G459 Transient cerebral ischemic attack, unspecified: Secondary | ICD-10-CM

## 2021-11-09 NOTE — Progress Notes (Signed)
?[facility] ? ? ? ?Provider:  Dr Lucia GaskinsAhern ?Requesting Provider: Marvel PlanXu, Jindong, MD ?Primary Care Provider:  Joaquim Namuncan, Graham S, MD ? ?CC:  TIA vs Migraine ? ?HPI:  Dana Sandoval is a 81 y.o. female here as requested by Marvel PlanXu, Jindong, MD for TIA vs migraine after hospital admission.  She has a past medical history of moderate aortic stenosis, essential hypertension, TIA, type 2 diabetes, acquired hypothyroidism, osteoarthritis and osteopenia, dyslipidemia, B12 deficiency, obesity, heart murmur, hyperlipidemia, macular degeneration.  [facility] Dr. Caryl PinaEric Lindzen saw her on March 22 for a transient episode of confusion, she presented to the emergency room after having an episode of confusion the day prior, symptom onset was 1230, she was at home eating lunch and was opening some mail when she suddenly could not make sense of what she was reading, had trouble thinking and could not get her words out suggesting a component of alexia receptive aphasia, EMS was called but she decided not to go to the ED, the next day family decided they wanted her to go to the ED, her symptoms completely resolved and they have not recurred.  Patient was on aspirin 81 mg at that time.  Her blood pressure was elevated in the emergency room 177/44 pulse 65, neurologic exam was unremarkable and only significant for no left patellar reflex due to a history of operations.  CT of the head showed age-related volume loss no focal or acute intracranial finding.  She was started on subcutaneous B12 supplementation and then advised to orally supplement thereafter.  Dr. Warren DanesXu's differential included migraine equivalent or ocular migraine because she had episodes of left visual field zigzag lines moving across her visual field for the last 15 to 20 years, each episode lasting about 5 minutes and happening 2-3 times a year without headache, during the episode she had similar visual episode and about 15 minutes later she had  difficulty reading the letter and difficulty talking with her daughter over the phone.  However TIA was also in the differential.  She was started on dual antiplatelets for 3 weeks after a complete stroke evaluation which included CTA of the head and neck with no large vessel occlusion or high-grade stenosis, MRI showed no acute infarct, EEG was within normal limits.  Patient was told to stay on aspirin and Plavix for 3 weeks then continue on aspirin.  Echocardiogram did not show any thrombus or embolic etiology or PFO.  It also appears as though she had 2 week monitoring for arrhythmias like A-fib. ? ?Patient states she has a history of zigzags across the eyes both eyes since 2004, here with daughter who provides much information, daughter has migraines, father may have had migraines. She has not had one since 2016-2017. 7-8 minutes. Exactly like the others. 15-20 minutes later had speech problems, "not much of a speech problem" she went to sit down and she opened up the letter, she couldn't understand the first line, she could read it but didn't make sense, built was a letter on a warranty she could talk, he understood her talking, her daughter called (not the one here today), she told daughter to get her a loaf of bread and daughter thought she couldn't get words out, daughter felt something was wrong and went tot he house and everything was fine, patient went to the golf cart and went to the feed barn where the cows are and the other daughter had called EMS and patient was extremely surprised.  She was totally fine. The next day she went to the ED because she started getting worried. She was only taking ASA every other day now taking it daily. Has not had another aura.  ? ?Reviewed notes, labs and imaging from outside physicians, which showed: ? ?LDL 74, hemoglobin A1c 6.2. ? ?MRI brain 09/08/2021: Personally reviewed and agree with the following:IMPRESSION: ?No acute infarct identified ?  ?Mild atrophy and mild  chronic microvascular ischemic change in the ?white matter. ? ?(Additional 10 minutes reviewing MRI of the brain images) ? ?Review of Systems: ?Patient complains of symptoms per HPI as well as the following symptoms migrain aura. Pertinent negatives and positives per HPI. All others negative. ? ? ?Social History  ? ?Socioeconomic History  ? Marital status: Married  ?  Spouse name: Not on file  ? Number of children: 4  ? Years of education: Not on file  ? Highest education level: Not on file  ?Occupational History  ? Not on file  ?Tobacco Use  ? Smoking status: Never  ? Smokeless tobacco: Never  ?Vaping Use  ? Vaping Use: Never used  ?Substance and Sexual Activity  ? Alcohol use: No  ?  Alcohol/week: 0.0 standard drinks  ? Drug use: No  ? Sexual activity: Yes  ?  Partners: Male  ?  Birth control/protection: Surgical  ?  Comment: 1st intercourse- 89, partners- 52, married- 70 yrs   ?Other Topics Concern  ? Not on file  ?Social History Narrative  ? Lives with husband, Joneen Boers  ? HCPOA- daughter, documents at home  ? ?Social Determinants of Health  ? ?Financial Resource Strain: Low Risk   ? Difficulty of Paying Living Expenses: Not hard at all  ?Food Insecurity: No Food Insecurity  ? Worried About Charity fundraiser in the Last Year: Never true  ? Ran Out of Food in the Last Year: Never true  ?Transportation Needs: No Transportation Needs  ? Lack of Transportation (Medical): No  ? Lack of Transportation (Non-Medical): No  ?Physical Activity: Inactive  ? Days of Exercise per Week: 0 days  ? Minutes of Exercise per Session: 0 min  ?Stress: No Stress Concern Present  ? Feeling of Stress : Not at all  ?Social Connections: Moderately Integrated  ? Frequency of Communication with Friends and Family: More than three times a week  ? Frequency of Social Gatherings with Friends and Family: More than three times a week  ? Attends Religious Services: 1 to 4 times per year  ? Active Member of Clubs or Organizations: No  ? Attends  Archivist Meetings: Never  ? Marital Status: Married  ?Intimate Partner Violence: Not At Risk  ? Fear of Current or Ex-Partner: No  ? Emotionally Abused: No  ? Physically Abused: No  ? Sexually Abused: No  ? ? ?Family History  ?Problem Relation Age of Onset  ? Osteoporosis Mother   ? Heart disease Mother   ? Macular degeneration Mother   ? Heart disease Father   ? Cancer Father   ?     BRAIN TUMOR  ? Cancer Brother   ?     BRAIN TUMOR  ? Diabetes Maternal Aunt   ? Diabetes Maternal Uncle   ? Stroke Paternal Grandfather   ? Colon cancer Neg Hx   ? Breast cancer Neg Hx   ? Migraines Neg Hx   ? ? ?Past Medical History:  ?Diagnosis Date  ? Aortic stenosis   ? Arthritis   ?  B12 deficiency   ? Diabetes mellitus   ? type 2  ? Heart murmur   ? aortic stenosis  ? Hyperlipidemia   ? Hypothyroidism   ? IBS (irritable bowel syndrome)   ? Macular degeneration   ? NSVD (normal spontaneous vaginal delivery)   ? X 4  ? Pain   ? INTRASCAPULAR PAIN  ? SAB (spontaneous abortion)   ? X 2  ? Thyroid disease   ? HYPOTHYROIDISM  ? ? ?Patient Active Problem List  ? Diagnosis Date Noted  ? Migraine aura without headache 11/09/2021  ? Obesity (BMI 30-39.9) 09/29/2021  ? TIA (transient ischemic attack) 09/28/2021  ? Healthcare maintenance 09/18/2021  ? Macular degeneration 09/18/2021  ? Essential hypertension 09/18/2021  ? Advance care planning 07/21/2020  ? B12 deficiency 07/20/2020  ? Primary osteoarthritis of left knee 09/10/2018  ? Acquired hypothyroidism 03/22/2018  ? Ovarian cyst, left 07/09/2014  ? Weight gain 03/18/2012  ? Family history of osteoporosis 03/18/2012  ? Aortic stenosis, moderate 03/20/2011  ? Dyslipidemia 03/20/2011  ? Controlled type 2 diabetes mellitus without complication, without long-term current use of insulin ([place]) 03/20/2011  ? 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 ? ?Past Surgical History:  ?Procedure Laterality Date  ? ABDOMINAL HYSTERECTOMY  1977  ? TAH  (FIBROIDS)  ? CATARACT EXTRACTION Bilateral    ? DILATION AND CURETTAGE OF UTERUS    ? DOPPLER ECHOCARDIOGRAPHY  2009  ? HAD AN ECHOCARDIOGRAM THAT SHOWED MODERATE MITRAL  ANNULAR CALCIFICATION AND A NORMAL EJECTION FRACTION OF 55%  ? [place]

## 2021-11-09 NOTE — Patient Instructions (Addendum)
Continue asa 81mg  daily ?Ocular migraines, or migraine with aura ?If happens again, stroke-like symptoms then call and we consider doing further workup ? ?There is increased risk for stroke in women with migraine with aura and a contraindication for the combined contraceptive pill for use by women who have migraine with aura. The risk for women with migraine without aura is lower. However other risk factors like smoking are far more likely to increase stroke risk than migraine. There is a recommendation for no smoking and for the use of OCPs without estrogen such as progestogen only pills particularly for women with migraine with aura. People who have migraine headaches with auras may be 3 times more likely to have a stroke caused by a blood clot, compared to migraine patients who don't see auras. Women who take hormone-replacement therapy may be 30 percent more likely to suffer a clot-based stroke than women not taking medication containing estrogen. Other risk factors like smoking and high blood pressure may be  much more important. ? ?Stroke Prevention ?Some medical conditions and behaviors can lead to a higher chance of having a stroke. You can help prevent a stroke by eating healthy, exercising, not smoking, and managing any medical conditions you have. ?Stroke is a leading cause of functional impairment. Primary prevention is particularly important because a majority of strokes are first-time events. Stroke changes the lives of not only those who experience a stroke but also their family and other caregivers. ?How can this condition affect me? ?A stroke is a medical emergency and should be treated right away. A stroke can lead to brain damage and can sometimes be life-threatening. If a person gets medical treatment right away, there is a better chance of surviving and recovering from a stroke. ?What can increase my risk? ?The following medical conditions may increase your risk of a stroke: ?Cardiovascular  disease. ?High blood pressure (hypertension). ?Diabetes. ?High cholesterol. ?Sickle cell disease. ?Blood clotting disorders (hypercoagulable state). ?Obesity. ?Sleep disorders (obstructive sleep apnea). ?Other risk factors include: ?Being older than age 83. ?Having a history of blood clots, stroke, or mini-stroke (transient ischemic attack, TIA). ?Genetic factors, such as race, ethnicity, or a family history of stroke. ?Smoking cigarettes or using other tobacco products. ?Taking birth control pills, especially if you also use tobacco. ?Heavy use of alcohol or drugs, especially cocaine and methamphetamine. ?Physical inactivity. ?What actions can I take to prevent this? ?Manage your health conditions ?High cholesterol levels. ?Eating a healthy diet is important for preventing high cholesterol. If cholesterol cannot be managed through diet alone, you may need to take medicines. ?Take any prescribed medicines to control your cholesterol as told by your health care provider. ?Hypertension. ?To reduce your risk of stroke, try to keep your blood pressure below 130/80. ?Eating a healthy diet and exercising regularly are important for controlling blood pressure. If these steps are not enough to manage your blood pressure, you may need to take medicines. ?Take any prescribed medicines to control hypertension as told by your health care provider. ?Ask your health care provider if you should monitor your blood pressure at home. ?Have your blood pressure checked every year, even if your blood pressure is normal. Blood pressure increases with age and some medical conditions. ?Diabetes. ?Eating a healthy diet and exercising regularly are important parts of managing your blood sugar (glucose). If your blood sugar cannot be managed through diet and exercise, you may need to take medicines. ?Take any prescribed medicines to control your diabetes as told by your  health care provider. ?Get evaluated for obstructive sleep apnea. Talk to  your health care provider about getting a sleep evaluation if you snore a lot or have excessive sleepiness. ?Make sure that any other medical conditions you have, such as atrial fibrillation or atherosclerosis, are managed. ?Nutrition ?Follow instructions from your health care provider about what to eat or drink to help manage your health condition. These instructions may include: ?Reducing your daily calorie intake. ?Limiting how much salt (sodium) you use to 1,500 milligrams (mg) each day. ?Using only healthy fats for cooking, such as olive oil, canola oil, or sunflower oil. ?Eating healthy foods. You can do this by: ?Choosing foods that are high in fiber, such as whole grains, and fresh fruits and vegetables. ?Eating at least 5 servings of fruits and vegetables a day. Try to fill one-half of your plate with fruits and vegetables at each meal. ?Choosing lean protein foods, such as lean cuts of meat, poultry without skin, fish, tofu, beans, and nuts. ?Eating low-fat dairy products. ?Avoiding foods that are high in sodium. This can help lower blood pressure. ?Avoiding foods that have saturated fat, trans fat, and cholesterol. This can help prevent high cholesterol. ?Avoiding processed and prepared foods. ?Counting your daily carbohydrate intake. ? ?Lifestyle ?If you drink alcohol: ?Limit how much you have to: ?0-1 drink a day for women who are not pregnant. ?0-2 drinks a day for men. ?Know how much alcohol is in your drink. In the [place] one drink equals one 12 oz bottle of beer ( ), one 5 oz glass of wine ( ), or one 1? oz glass of hard liquor (74mL). ?Do not use any products that contain nicotine or tobacco. These products include cigarettes, chewing tobacco, and vaping devices, such as e-cigarettes. If you need help quitting, ask your health care provider. ?Avoid secondhand smoke. ?Do not use drugs. ?Activity ? ?Try to stay at a healthy weight. ?Get at least 30 minutes of exercise on most days, such  as: ?Fast walking. ?Biking. ?Swimming. ?Medicines ?Take over-the-counter and prescription medicines only as told by your health care provider. Aspirin or blood thinners (antiplatelets or anticoagulants) may be recommended to reduce your risk of forming blood clots that can lead to stroke. ?Avoid taking birth control pills. Talk to your health care provider about the risks of taking birth control pills if: ?You are over 77 years old. ?You smoke. ?You get very bad headaches. ?You have had a blood clot. ?Where to find more information ?American Stroke Association: www.strokeassociation.org ?Get help right away if: ?You or a loved one has any symptoms of a stroke. "BE FAST" is an easy way to remember the main warning signs of a stroke: ?B - Balance. Signs are dizziness, sudden trouble walking, or loss of balance. ?E - Eyes. Signs are trouble seeing or a sudden change in vision. ?F - Face. Signs are sudden weakness or numbness of the face, or the face or eyelid drooping on one side. ?A - Arms. Signs are weakness or numbness in an arm. This happens suddenly and usually on one side of the body. ?S - Speech. Signs are sudden trouble speaking, slurred speech, or trouble understanding what people say. ?T - Time. Time to call emergency services. Write down what time symptoms started. ?You or a loved one has other signs of a stroke, such as: ?A sudden, severe headache with no known cause. ?Nausea or vomiting. ?Seizure. ?These symptoms may represent a serious problem that is an emergency. Do not wait to see  if the symptoms will go away. Get medical help right away. Call your local emergency services (911 in the [place] Do not drive yourself to the hospital. ?Summary ?You can help to prevent a stroke by eating healthy, exercising, not smoking, limiting alcohol intake, and managing any medical conditions you may have. ?Do not use any products that contain nicotine or tobacco. These include cigarettes, chewing tobacco, and vaping  devices, such as e-cigarettes. If you need help quitting, ask your health care provider. ?Remember "BE FAST" for warning signs of a stroke. Get help right away if you or a loved one has any of these signs. ?Thi

## 2021-11-12 ENCOUNTER — Telehealth: Payer: Self-pay | Admitting: Cardiology

## 2021-11-12 DIAGNOSIS — I480 Paroxysmal atrial fibrillation: Secondary | ICD-10-CM

## 2021-11-12 DIAGNOSIS — G459 Transient cerebral ischemic attack, unspecified: Secondary | ICD-10-CM

## 2021-11-12 MED ORDER — APIXABAN 5 MG PO TABS: 5.0000 mg | ORAL_TABLET | Freq: Two times a day (BID) | ORAL | 3 refills | Status: DC

## 2021-11-12 NOTE — Telephone Encounter (Signed)
Zio Patch Extended out patient EKG monitoring 13 days starting 10/18/2021: ?Patient had a min HR of 54 bpm, max HR of 191 bpm, and avg HR of 81 bpm. Predominant underlying rhythm was Sinus Rhythm.  ?1 run of Ventricular Tachycardia occurred lasting 4 beats with a max rate of 124 bpm (avg 117 bpm).  ?Atrial Fibrillation occurred (5% burden), ranging from 65-191 bpm (avg of 128 bpm), the longest lasting 16 hours 12 mins with an avg rate of 128 bpm. ?Isolated SVEs were frequent (8.6%, 131424), rare atrial couplets and triplets. ?Rare PVCs, ventricular couplets and trigeminy. ?No symptoms reported. ? ?15  minute tel encounter ? ?  ICD-10-CM   ?1. TIA (transient ischemic attack)  G45.9 apixaban (ELIQUIS) 5 MG TABS tablet  ?  ?2. Paroxysmal atrial fibrillation (HCC)  I48.0 apixaban (ELIQUIS) 5 MG TABS tablet  ?  ? ?Meds ordered this encounter  ?Medications  ? apixaban (ELIQUIS) 5 MG TABS tablet  ?  Sig: Take 1 tablet (5 mg total) by mouth 2 (two) times daily.  ?  Dispense:  60 tablet  ?  Refill:  3  ?  ?Medications Discontinued During This Encounter  ?Medication Reason  ? aspirin EC 81 MG tablet Change in therapy  ?  ? ?Adrian Prows, MD, [facility] ?11/12/2021, 10:13 AM ?Office: [phone] ?Fax: [phone] ?Pager: [phone]  ?

## 2021-11-13 NOTE — Telephone Encounter (Signed)
Appreciate cardiology input. 

## 2021-11-17 NOTE — Progress Notes (Signed)
[facility] Note  11/25/2021     CHIEF COMPLAINT Patient presents for Retina Follow Up  HISTORY OF PRESENT ILLNESS: Dana Sandoval is a 81 y.o. 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  This started 4 weeks ago.  I, the attending physician,  performed the HPI with the patient and updated documentation appropriately.        Comments   Patient here for 4 weeks retina follow up for exu ARMD OD. Patient states vision about the same. No worse. No eye pain. DX with afib started taking Eliquis.      Last edited by Bernarda Caffey, MD on 11/26/2021 10:28 PM.     Pt states vision is stable, pt recently started taking Eliquis for Afib, pt states last Saturday she had another ocular migraine and then on Sunday morning, she had an episode where she was unable to see the nose on the face of one of her friends  Referring physician: Tonia Ghent, MD [place],  [place] [phone]  HISTORICAL INFORMATION:  Selected notes from the [facility] Referred by Dr. Zenia Resides for eval of ret heme OD   CURRENT MEDICATIONS: No current outpatient medications on file. (Ophthalmic Drugs)   No current facility-administered medications for this visit. (Ophthalmic Drugs)   Current Outpatient Medications (Other)  Medication Sig   apixaban (ELIQUIS) 5 MG TABS tablet Take 1 tablet (5 mg total) by mouth 2 (two) times daily.   atorvastatin (LIPITOR) 40 MG tablet TAKE 1 TABLET BY MOUTH ONCE A DAY AT 6PM   glucose blood (ACCU-CHEK AVIVA PLUS) test strip Use as directed to check BS daily. Dx: E11.9   levothyroxine (SYNTHROID) 88 MCG tablet Take 1 tablet (88 mcg total) by mouth daily.   metFORMIN (GLUCOPHAGE) 500 MG tablet Take 1 tablet (500 mg total) by mouth 2 (two) times daily with a meal.   Multiple Vitamins-Minerals (PRESERVISION AREDS 2 PO) Take 1 tablet by mouth 2  (two) times daily.    ramipril (ALTACE) 10 MG capsule TAKE 1 CAPSULE BY MOUTH ONCE DAILY   valACYclovir (VALTREX) 500 MG tablet Take 500 mg by mouth daily as needed (fever blister).   vitamin B-12 (CYANOCOBALAMIN) 1000 MCG tablet Take 1 tablet (1,000 mcg total) by mouth daily.   VITAMIN D PO Take 1,000 Units by mouth daily.   No current facility-administered medications for this visit. (Other)   REVIEW OF SYSTEMS: ROS   Positive for: Endocrine, Cardiovascular, Eyes Negative for: Constitutional, Gastrointestinal, Neurological, Skin, Genitourinary, Musculoskeletal, HENT, Respiratory, Psychiatric, Allergic/Imm, Heme/Lymph Last edited by Theodore Demark, COA on 11/25/2021  8:07 AM.     ALLERGIES Allergies  Allergen Reactions   Codeine Itching    After 3 days   Dilaudid [Hydromorphone Hcl] Itching    After 3 days   PAST MEDICAL HISTORY Past Medical History:  Diagnosis Date   Aortic stenosis    Arthritis    B12 deficiency    Diabetes mellitus    type 2   Heart murmur    aortic stenosis   Hyperlipidemia    Hypothyroidism    IBS (irritable bowel syndrome)    Macular degeneration    NSVD (normal spontaneous vaginal delivery)    X 4   Pain    INTRASCAPULAR PAIN   SAB (spontaneous abortion)  X 2   Thyroid disease    HYPOTHYROIDISM   Past Surgical History:  Procedure Laterality Date   ABDOMINAL HYSTERECTOMY  1977   TAH  (FIBROIDS)   CATARACT EXTRACTION Bilateral    DILATION AND CURETTAGE OF UTERUS     DOPPLER ECHOCARDIOGRAPHY  2009   HAD AN ECHOCARDIOGRAM THAT SHOWED MODERATE MITRAL  ANNULAR CALCIFICATION AND A NORMAL EJECTION FRACTION OF 55%   EYE SURGERY Bilateral    Cat Sx   OTHER SURGICAL HISTORY     HYSTERECTOMY   SHOULDER SURGERY     RIGHT SHOULDER SURGERY   THYROIDECTOMY     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   RIGHT   TOTAL KNEE ARTHROPLASTY Left 09/10/2018   Procedure: TOTAL KNEE ARTHROPLASTY;  Surgeon: Melrose Nakayama, MD;  Location: [facility];  Service:  Orthopedics;  Laterality: Left;   FAMILY HISTORY Family History  Problem Relation Age of Onset   Osteoporosis Mother    Heart disease Mother    Macular degeneration Mother    Heart disease Father    Cancer Father        BRAIN TUMOR   Cancer Brother        BRAIN TUMOR   Diabetes Maternal Aunt    Diabetes Maternal Uncle    Stroke Paternal Grandfather    Colon cancer Neg Hx    Breast cancer Neg Hx    Migraines Neg Hx    SOCIAL HISTORY Social History   Tobacco Use   Smoking status: Never   Smokeless tobacco: Never  Vaping Use   Vaping Use: Never used  Substance Use Topics   Alcohol use: No    Alcohol/week: 0.0 standard drinks   Drug use: No       OPHTHALMIC EXAM: Base Eye Exam     Visual Acuity (Snellen - Linear)       Right Left   Dist [place] 20/40 20/30 -1   Dist ph [place] 20/40 +1 NI         Tonometry (Tonopen, 8:05 AM)       Right Left   Pressure 13 15         Pupils       Dark Light Shape React APD   Right 3 2 Round Brisk None   Left 3 2 Round Brisk None         Visual Fields (Counting fingers)       Left Right    Full Full         Extraocular Movement       Right Left    Full, Ortho Full, Ortho         Neuro/Psych     Oriented x3: Yes   Mood/Affect: Normal         Dilation     Both eyes: 1.0% Mydriacyl, 2.5% Phenylephrine @ 8:04 AM           Slit Lamp and Fundus Exam     Slit Lamp Exam       Right Left   Lids/Lashes Dermatochalasis - upper lid Dermatochalasis - upper lid   Conjunctiva/Sclera White and quiet White and quiet   Cornea 1+ Punctate epithelial erosions, fine endo pigment 1-2+ fine Punctate epithelial erosions, fine endo pigment   Anterior Chamber Deep and quiet Deep and quiet   Iris Round and dilated Round and dilated   Lens Toric PC IOL with marks at 1200 and 0600, 1+ Posterior capsular opacification nasal side approaching visual axis PC IOL  in good position, 1-2+ Posterior capsular opacification    Anterior Vitreous Vitreous syneresis, trace pigment, Posterior vitreous detachment, vitreous condensations Vitreous syneresis, Posterior vitreous detachment, vitreous condensations         Fundus Exam       Right Left   Disc mild Pallor, Sharp rim, +cupping, PPA mild Pallor, Sharp rim, +cupping, PPA   C/D Ratio 0.6 0.6   Macula Blunted foveal reflex, central CNV with subretinal heme-- stably resolved -- now with pigment ring, RPE mottling, clumping and atrophy, Drusen, shallow SRF overlying CNV -- stably resolved, cystic changes temporal mac - persistent Flat, Blunted foveal reflex, Drusen, RPE mottling, clumping and atrophy, No heme or edema   Vessels attenuated attenuated   Periphery Attached, reticular degeneration, paving stone degeneration IT quad, No heme Attached, reticular degeneration, mild inferior paving stone degeneration, No heme           IMAGING AND PROCEDURES  Imaging and Procedures for 11/25/2021  OCT, Retina - OU - Both Eyes       Right Eye Quality was good. Central Foveal Thickness: 317. Progression has been stable. Findings include abnormal foveal contour, subretinal hyper-reflective material, pigment epithelial detachment, retinal drusen , outer retinal atrophy, no SRF, no IRF (Stable improvement in SRF overlying PED; stable cystic changes / IRF temporal fovea overlying central PED / [place]).   Left Eye Quality was good. Central Foveal Thickness: 222. Progression has been stable. Findings include normal foveal contour, no IRF, no SRF, outer retinal atrophy, retinal drusen .   Notes *Images captured and stored on drive  Diagnosis / Impression:  OD: Stable improvement in SRF overlying PED; stable cystic changes / IRF temporal fovea overlying central PED / SRHM OS: non-exu ARMD   Clinical management:  See below  Abbreviations: NFP - Normal foveal profile. CME - cystoid macular edema. PED - pigment epithelial detachment. IRF - intraretinal fluid. SRF -  subretinal fluid. EZ - ellipsoid zone. ERM - epiretinal membrane. ORA - outer retinal atrophy. ORT - outer retinal tubulation. SRHM - subretinal hyper-reflective material. IRHM - intraretinal hyper-reflective material      Intravitreal Injection, Pharmacologic Agent - OD - Right Eye       Time Out 11/25/2021. 8:04 AM. Confirmed correct patient, procedure, site, and patient consented.   Anesthesia Topical anesthesia was used. Anesthetic medications included Lidocaine 2%, Proparacaine 0.5%.   Procedure Preparation included 5% betadine to ocular surface, eyelid speculum. A (32g) needle was used.   Injection: 2 mg aflibercept 2 MG/0.05ML   Route: Intravitreal, Site: Right Eye   NDC: [id number], Lot: [id number], Expiration date: 10/08/2022, Waste: 0 mL   Post-op Post injection exam found visual acuity of at least counting fingers. The patient tolerated the procedure well. There were no complications. The patient received written and verbal post procedure care education. Post injection medications were not given.             ASSESSMENT/PLAN:   ICD-10-CM   1. Exudative age-related macular degeneration of right eye with active choroidal neovascularization (HCC)  H35.3211 OCT, Retina - OU - Both Eyes    Intravitreal Injection, Pharmacologic Agent - OD - Right Eye    aflibercept (EYLEA) SOLN 2 mg    2. Intermediate stage nonexudative age-related macular degeneration of left eye  H35.3122     3. Essential hypertension  I10     4. Hypertensive retinopathy of both eyes  H35.033     5. Pseudophakia, both eyes  Z96.1     6.  PCO (posterior capsule opacification), bilateral  H26.493       1. Exudative age related macular degeneration, OD  - s/p IVA OD #1 (05.06.22), #2 (06.03.22), #3 (07.01.22), #4 (07.29.22), #5 (08.26.22), #6 (09.23.22), #7 (10.21.22), #8 (11.18.22), #9 (12.16.22), #10 (01.20.23)  - s/p IVE OD #1 (02.24.23) -- sample, #2 (03.24.23), #3 (04.21.23)  - OCT shows  Stable improvement in SRF overlying PED, persistent cystic changes / IRF temporal fovea overlying central PED / SRHM at 4 weeks  - BCVA 20/40 OD  - discussed IVA resistance and possible switch in medication  - recommend IVE OD #4 today, 05.19.23 -- w/ f/u in 4-5 wks - pt wishes to be treated with IVE - RBA of procedure discussed, questions answered - IVA informed consent obtained and signed, 05.06.22 (OD) - IVE informed consent obtained and signed, 02.24.23 (OD) - see procedure note  - f/u in 4-5 wks -- DFE/OCT, possible injection  2. Age related macular degeneration, non-exudative, OS  - The incidence, anatomy, and pathology of dry AMD, risk of progression, and the AREDS and AREDS 2 studies including smoking risks discussed with patient.   - Recommend amsler grid monitoring   3,4. Hypertensive retinopathy OU - discussed importance of tight BP control - monitor   5. Pseudophakia OU  - s/p CE/IOL  - IOL in good position, doing well  - monitor   6. PCO OU (OD > OS)  - monitor    Ophthalmic Meds Ordered this visit:  Meds ordered this encounter  Medications   aflibercept (EYLEA) SOLN 2 mg     Return for f/u 4-5 weeks, exu ARMD OD, DFE, OCT.  There are no Patient Instructions on file for this visit.  This document serves as a record of services personally performed by Gardiner Sleeper, MD, PhD. It was created on their behalf by Leonie Douglas, an ophthalmic technician. The creation of this record is the provider's dictation and/or activities during the visit.    Electronically signed by: Leonie Douglas COA, 11/28/21  2:11 AM   Gardiner Sleeper, M.D., Ph.D. Diseases & Surgery of the Retina and Vitreous [facility]  I have reviewed the above documentation for accuracy and completeness, and I agree with the above. Gardiner Sleeper, M.D., Ph.D. 11/28/21 2:14 AM   Abbreviations: M myopia (nearsighted); A astigmatism; H hyperopia (farsighted); P presbyopia; Mrx  spectacle prescription;  CTL contact lenses; OD right eye; OS left eye; OU both eyes  XT exotropia; ET esotropia; PEK punctate epithelial keratitis; PEE punctate epithelial erosions; DES dry eye syndrome; MGD meibomian gland dysfunction; ATs artificial tears; PFAT's preservative free artificial tears; [place] nuclear sclerotic cataract; PSC posterior subcapsular cataract; ERM epi-retinal membrane; PVD posterior vitreous detachment; RD retinal detachment; DM diabetes mellitus; DR diabetic retinopathy; NPDR non-proliferative diabetic retinopathy; PDR proliferative diabetic retinopathy; CSME clinically significant macular edema; DME diabetic macular edema; dbh dot blot hemorrhages; CWS cotton wool spot; POAG primary open angle glaucoma; C/D cup-to-disc ratio; HVF humphrey visual field; GVF goldmann visual field; OCT optical coherence tomography; IOP intraocular pressure; BRVO Branch retinal vein occlusion; CRVO central retinal vein occlusion; CRAO central retinal artery occlusion; BRAO branch retinal artery occlusion; RT retinal tear; SB scleral buckle; PPV pars plana vitrectomy; VH Vitreous hemorrhage; PRP panretinal laser photocoagulation; IVK intravitreal kenalog; VMT vitreomacular traction; MH Macular hole;  NVD neovascularization of the disc; NVE neovascularization elsewhere; AREDS age related eye disease study; ARMD age related macular degeneration; POAG primary open angle glaucoma; EBMD epithelial/anterior  basement membrane dystrophy; ACIOL anterior chamber intraocular lens; IOL intraocular lens; PCIOL posterior chamber intraocular lens; Phaco/IOL phacoemulsification with intraocular lens placement; [place] photorefractive keratectomy; LASIK laser assisted in situ keratomileusis; HTN hypertension; DM diabetes mellitus; COPD chronic obstructive pulmonary disease

## 2021-11-25 ENCOUNTER — Encounter (INDEPENDENT_AMBULATORY_CARE_PROVIDER_SITE_OTHER): Payer: Self-pay | Admitting: Ophthalmology

## 2021-11-25 ENCOUNTER — Ambulatory Visit (INDEPENDENT_AMBULATORY_CARE_PROVIDER_SITE_OTHER): Payer: Medicare PPO | Admitting: Ophthalmology

## 2021-11-25 DIAGNOSIS — H353122 Nonexudative age-related macular degeneration, left eye, intermediate dry stage: Secondary | ICD-10-CM

## 2021-11-25 DIAGNOSIS — H35033 Hypertensive retinopathy, bilateral: Secondary | ICD-10-CM

## 2021-11-25 DIAGNOSIS — H353211 Exudative age-related macular degeneration, right eye, with active choroidal neovascularization: Secondary | ICD-10-CM

## 2021-11-25 DIAGNOSIS — Z961 Presence of intraocular lens: Secondary | ICD-10-CM | POA: Diagnosis not present

## 2021-11-25 DIAGNOSIS — I10 Essential (primary) hypertension: Secondary | ICD-10-CM

## 2021-11-25 DIAGNOSIS — H26493 Other secondary cataract, bilateral: Secondary | ICD-10-CM

## 2021-11-26 ENCOUNTER — Encounter (INDEPENDENT_AMBULATORY_CARE_PROVIDER_SITE_OTHER): Payer: Self-pay | Admitting: Ophthalmology

## 2021-11-28 MED ORDER — AFLIBERCEPT 2MG/0.05ML IZ SOLN FOR KALEIDOSCOPE
2.0000 mg | INTRAVITREAL | Status: AC | PRN
  Administered 2021-11-28: 2 mg via INTRAVITREAL

## 2021-12-01 ENCOUNTER — Telehealth: Payer: Self-pay

## 2021-12-01 MED ORDER — ACCU-CHEK SOFTCLIX LANCETS MISC
12 refills | Status: DC
Start: 2021-12-01 — End: 2023-11-16

## 2021-12-01 MED ORDER — ACCU-CHEK GUIDE W/DEVICE KIT: PACK | 0 refills | Status: DC

## 2021-12-01 MED ORDER — ACCU-CHEK GUIDE VI STRP
ORAL_STRIP | 12 refills | Status: DC
Start: 2021-12-01 — End: 2022-09-26

## 2021-12-01 NOTE — Telephone Encounter (Signed)
New erxs have been sent.

## 2021-12-01 NOTE — Telephone Encounter (Signed)
Chris from [facility] called to say Accu-Chek Aviva is being phased out. Pt needs new rxs for Accu-chek Guide Meter, test strips, and Accuchek Softclix Lancets and device.

## 2021-12-01 NOTE — Addendum Note (Signed)
Addended by: Sherrilee Gilles B on: 12/01/2021 02:25 PM   Modules accepted: Orders

## 2021-12-06 ENCOUNTER — Other Ambulatory Visit: Payer: Self-pay | Admitting: Cardiology

## 2021-12-06 DIAGNOSIS — I480 Paroxysmal atrial fibrillation: Secondary | ICD-10-CM

## 2021-12-06 DIAGNOSIS — G459 Transient cerebral ischemic attack, unspecified: Secondary | ICD-10-CM

## 2021-12-20 NOTE — Progress Notes (Signed)
[facility] Note  12/23/2021     CHIEF COMPLAINT Patient presents for Retina Follow Up  HISTORY OF PRESENT ILLNESS: Dana Sandoval is a 81 y.o. female who presents to the clinic today for:   HPI     Retina Follow Up   Patient presents with  Wet AMD (IVE OD #4 (05.19.23)).  In right eye.  This started months ago.  Duration of 4.5 weeks.  Since onset it is stable.  I, the attending physician,  performed the HPI with the patient and updated documentation appropriately.        Comments   Patient feels that the vision has been stable. Patient admits to having a floater after the last injection.       Last edited by Bernarda Caffey, MD on 12/23/2021  8:15 AM.     Referring physician: Tonia Ghent, MD [place],  [place] [address]  HISTORICAL INFORMATION:  Selected notes from the MEDICAL RECORD NUMBER Referred by Dr. Zenia Resides for eval of ret heme OD   CURRENT MEDICATIONS: No current outpatient medications on file. (Ophthalmic Drugs)   No current facility-administered medications for this visit. (Ophthalmic Drugs)   Current Outpatient Medications (Other)  Medication Sig   Accu-Chek Softclix Lancets lancets Use to check blood sugar daily. Dx E11.9   atorvastatin (LIPITOR) 40 MG tablet TAKE 1 TABLET BY MOUTH ONCE A DAY AT 6PM   Blood Glucose Monitoring Suppl (ACCU-CHEK GUIDE) w/Device KIT Use to check blood sugar daily. Dx E11.9   ELIQUIS 5 MG TABS tablet TAKE 1 TABLET BY MOUTH TWICE A DAY   glucose blood (ACCU-CHEK GUIDE) test strip Use to check blood sugar daily. Dx E11.9   levothyroxine (SYNTHROID) 88 MCG tablet Take 1 tablet (88 mcg total) by mouth daily.   metFORMIN (GLUCOPHAGE) 500 MG tablet Take 1 tablet (500 mg total) by mouth 2 (two) times daily with a meal.   Multiple Vitamins-Minerals (PRESERVISION AREDS 2 PO) Take 1 tablet by mouth 2 (two) times daily.    ramipril (ALTACE) 10 MG capsule TAKE 1 CAPSULE BY MOUTH ONCE  DAILY   valACYclovir (VALTREX) 500 MG tablet Take 500 mg by mouth daily as needed (fever blister).   vitamin B-12 (CYANOCOBALAMIN) 1000 MCG tablet Take 1 tablet (1,000 mcg total) by mouth daily.   VITAMIN D PO Take 1,000 Units by mouth daily.   No current facility-administered medications for this visit. (Other)   REVIEW OF SYSTEMS: ROS   Positive for: Endocrine, Cardiovascular, Eyes Negative for: Constitutional, Gastrointestinal, Neurological, Skin, Genitourinary, Musculoskeletal, HENT, Respiratory, Psychiatric, Allergic/Imm, Heme/Lymph Last edited by Annie Paras, COT on 12/23/2021  8:01 AM.     ALLERGIES Allergies  Allergen Reactions   Codeine Itching    After 3 days   Dilaudid [Hydromorphone Hcl] Itching    After 3 days   PAST MEDICAL HISTORY Past Medical History:  Diagnosis Date   Aortic stenosis    Arthritis    B12 deficiency    Diabetes mellitus    type 2   Heart murmur    aortic stenosis   Hyperlipidemia    Hypothyroidism    IBS (irritable bowel syndrome)    Macular degeneration    NSVD (normal spontaneous vaginal delivery)    X 4   Pain    INTRASCAPULAR PAIN   SAB (spontaneous abortion)    X 2   Thyroid disease    HYPOTHYROIDISM   Past Surgical History:  Procedure Laterality Date   ABDOMINAL HYSTERECTOMY  1977   TAH  (FIBROIDS)   CATARACT EXTRACTION Bilateral    DILATION AND CURETTAGE OF UTERUS     DOPPLER ECHOCARDIOGRAPHY  2009   HAD AN ECHOCARDIOGRAM THAT SHOWED MODERATE MITRAL  ANNULAR CALCIFICATION AND A NORMAL EJECTION FRACTION OF 55%   EYE SURGERY Bilateral    Cat Sx   OTHER SURGICAL HISTORY     HYSTERECTOMY   SHOULDER SURGERY     RIGHT SHOULDER SURGERY   THYROIDECTOMY     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   RIGHT   TOTAL KNEE ARTHROPLASTY Left 09/10/2018   Procedure: TOTAL KNEE ARTHROPLASTY;  Surgeon: Melrose Nakayama, MD;  Location: [facility];  Service: Orthopedics;  Laterality: Left;   FAMILY HISTORY Family History  Problem  Relation Age of Onset   Osteoporosis Mother    Heart disease Mother    Macular degeneration Mother    Heart disease Father    Cancer Father        BRAIN TUMOR   Cancer Brother        BRAIN TUMOR   Diabetes Maternal Aunt    Diabetes Maternal Uncle    Stroke Paternal Grandfather    Colon cancer Neg Hx    Breast cancer Neg Hx    Migraines Neg Hx    SOCIAL HISTORY Social History   Tobacco Use   Smoking status: Never   Smokeless tobacco: Never  Vaping Use   Vaping Use: Never used  Substance Use Topics   Alcohol use: No    Alcohol/week: 0.0 standard drinks of alcohol   Drug use: No       OPHTHALMIC EXAM: Base Eye Exam     Visual Acuity (Snellen - Linear)       Right Left   Dist [place] 20/50 20/30 +2   Dist ph [place] 20/40 +2 NI         Tonometry (Tonopen, 8:07 AM)       Right Left   Pressure 10 16         Pupils       Pupils Dark Light Shape React APD   Right PERRL 3 2 Round Brisk None   Left PERRL 3 2 Round Brisk None         Visual Fields       Left Right    Full Full         Extraocular Movement       Right Left    Full, Ortho Full, Ortho         Neuro/Psych     Oriented x3: Yes   Mood/Affect: Normal         Dilation     Both eyes: 2.5% Phenylephrine, 1.0% Mydriacyl @ 8:03 AM           Slit Lamp and Fundus Exam     Slit Lamp Exam       Right Left   Lids/Lashes Dermatochalasis - upper lid Dermatochalasis - upper lid   Conjunctiva/Sclera White and quiet White and quiet   Cornea 1+ Punctate epithelial erosions, fine endo pigment 1-2+ fine Punctate epithelial erosions, fine endo pigment   Anterior Chamber Deep and quiet Deep and quiet   Iris Round and dilated Round and dilated   Lens Toric PC IOL with marks at 1200 and 0600, 1+ Posterior capsular opacification nasal side approaching visual axis PC IOL in good position, 1-2+ Posterior capsular opacification   Anterior Vitreous Vitreous syneresis,  trace pigment, Posterior vitreous  detachment, vitreous condensations Vitreous syneresis, Posterior vitreous detachment, vitreous condensations         Fundus Exam       Right Left   Disc mild Pallor, Sharp rim, +cupping, PPA mild Pallor, Sharp rim, +cupping, PPA   C/D Ratio 0.6 0.6   Macula Blunted foveal reflex, central CNV with subretinal heme-- stably resolved -- now with pigment ring, RPE mottling, clumping and atrophy, Drusen, shallow SRF overlying CNV -- stably resolved, cystic changes temporal mac - improved Flat, Blunted foveal reflex, Drusen, RPE mottling, clumping and atrophy, No heme or edema   Vessels attenuated attenuated   Periphery Attached, reticular degeneration, paving stone degeneration IT quad, No heme Attached, reticular degeneration, mild inferior paving stone degeneration, No heme           IMAGING AND PROCEDURES  Imaging and Procedures for 12/23/2021  OCT, Retina - OU - Both Eyes       Right Eye Quality was good. Central Foveal Thickness: 314. Progression has improved. Findings include no IRF, no SRF, abnormal foveal contour, retinal drusen , subretinal hyper-reflective material, pigment epithelial detachment, outer retinal atrophy (Stable improvement in SRF overlying PED; mild interval improvement in cystic changes / IRF temporal fovea overlying central PED / [facility]).   Left Eye Quality was good. Central Foveal Thickness: 225. Progression has been stable. Findings include normal foveal contour, no IRF, no SRF, retinal drusen , outer retinal atrophy.   Notes *Images captured and stored on drive  Diagnosis / Impression:  OD: Stable improvement in SRF overlying PED; mild interval improvement in cystic changes / IRF temporal fovea overlying central PED / SRHM OS: non-exu ARMD   Clinical management:  See below  Abbreviations: NFP - Normal foveal profile. CME - cystoid macular edema. PED - pigment epithelial detachment. IRF - intraretinal fluid. SRF - subretinal fluid. EZ - ellipsoid zone. ERM  - epiretinal membrane. ORA - outer retinal atrophy. ORT - outer retinal tubulation. SRHM - subretinal hyper-reflective material. IRHM - intraretinal hyper-reflective material      Intravitreal Injection, Pharmacologic Agent - OD - Right Eye       Time Out 12/23/2021. 8:22 AM. Confirmed correct patient, procedure, site, and patient consented.   Anesthesia Topical anesthesia was used. Anesthetic medications included Lidocaine 2%, Proparacaine 0.5%.   Procedure Preparation included 5% betadine to ocular surface, eyelid speculum. A (32g) needle was used.   Injection: 2 mg aflibercept 2 MG/0.05ML   Route: Intravitreal, Site: Right Eye   NDC: [id number], Lot: [id number], Expiration date: 10/07/2022, Waste: 0 mL   Post-op Post injection exam found visual acuity of at least counting fingers. The patient tolerated the procedure well. There were no complications. The patient received written and verbal post procedure care education. Post injection medications were not given.            ASSESSMENT/PLAN:   ICD-10-CM   1. Exudative age-related macular degeneration of right eye with active choroidal neovascularization (HCC)  H35.3211 OCT, Retina - OU - Both Eyes    Intravitreal Injection, Pharmacologic Agent - OD - Right Eye    aflibercept (EYLEA) SOLN 2 mg    2. Intermediate stage nonexudative age-related macular degeneration of left eye  H35.3122     3. Essential hypertension  I10     4. Hypertensive retinopathy of both eyes  H35.033     5. Pseudophakia, both eyes  Z96.1     6. PCO (posterior capsule opacification), bilateral  H26.493  1. Exudative age related macular degeneration, OD  - s/p IVA OD #1 (05.06.22), #2 (06.03.22), #3 (07.01.22), #4 (07.29.22), #5 (08.26.22), #6 (09.23.22), #7 (10.21.22), #8 (11.18.22), #9 (12.16.22), #10 (01.20.23) -- IVA resistance  - s/p IVE OD #1 (02.24.23) -- sample, #2 (03.24.23), #3 (04.21.23), #4 (05.19.23)  - OCT shows Stable  improvement in SRF overlying PED, mild interval improvement in cystic changes / IRF temporal fovea overlying central PED / SRHM at 4 weeks  - BCVA 20/40 OD  - recommend IVE OD #5 today, 06.16.23 -- w/ f/u in 4-5 wks - pt wishes to be treated with IVE - RBA of procedure discussed, questions answered - IVA informed consent obtained and signed, 05.06.22 (OD) - IVE informed consent obtained and signed, 02.24.23 (OD) - see procedure note  - f/u in 4-5 wks -- DFE/OCT, possible injection  2. Age related macular degeneration, non-exudative, OS  - The incidence, anatomy, and pathology of dry AMD, risk of progression, and the AREDS and AREDS 2 studies including smoking risks discussed with patient.   - Recommend amsler grid monitoring   3,4. Hypertensive retinopathy OU - discussed importance of tight BP control - monitor   5. Pseudophakia OU  - s/p CE/IOL  - IOL in good position, doing well  - monitor    6. PCO OU (OD > OS)  - monitor   Ophthalmic Meds Ordered this visit:  Meds ordered this encounter  Medications   aflibercept (EYLEA) SOLN 2 mg     Return for Return 4-5 weeks DFE/OCT/possibe injection .  There are no Patient Instructions on file for this visit.  This document serves as a record of services personally performed by Gardiner Sleeper, MD, PhD. It was created on their behalf by Leonie Douglas, an ophthalmic technician. The creation of this record is the provider's dictation and/or activities during the visit.    Electronically signed by: Leonie Douglas COA, 12/23/21  8:46 AM   Gardiner Sleeper, M.D., Ph.D. Diseases & Surgery of the Retina and Vitreous [facility]  I have reviewed the above documentation for accuracy and completeness, and I agree with the above. Gardiner Sleeper, M.D., Ph.D. 12/23/21 8:47 AM   Abbreviations: M myopia (nearsighted); A astigmatism; H hyperopia (farsighted); P presbyopia; Mrx spectacle prescription;  CTL contact lenses;  OD right eye; OS left eye; OU both eyes  XT exotropia; ET esotropia; PEK punctate epithelial keratitis; PEE punctate epithelial erosions; DES dry eye syndrome; MGD meibomian gland dysfunction; ATs artificial tears; PFAT's preservative free artificial tears; Bear nuclear sclerotic cataract; PSC posterior subcapsular cataract; ERM epi-retinal membrane; PVD posterior vitreous detachment; RD retinal detachment; DM diabetes mellitus; DR diabetic retinopathy; NPDR non-proliferative diabetic retinopathy; PDR proliferative diabetic retinopathy; CSME clinically significant macular edema; DME diabetic macular edema; dbh dot blot hemorrhages; CWS cotton wool spot; POAG primary open angle glaucoma; C/D cup-to-disc ratio; HVF humphrey visual field; GVF goldmann visual field; OCT optical coherence tomography; IOP intraocular pressure; BRVO Branch retinal vein occlusion; CRVO central retinal vein occlusion; CRAO central retinal artery occlusion; BRAO branch retinal artery occlusion; RT retinal tear; SB scleral buckle; PPV pars plana vitrectomy; VH Vitreous hemorrhage; PRP panretinal laser photocoagulation; IVK intravitreal kenalog; VMT vitreomacular traction; MH Macular hole;  NVD neovascularization of the disc; NVE neovascularization elsewhere; AREDS age related eye disease study; ARMD age related macular degeneration; POAG primary open angle glaucoma; EBMD epithelial/anterior basement membrane dystrophy; ACIOL anterior chamber intraocular lens; IOL intraocular lens; PCIOL posterior chamber intraocular lens; Phaco/IOL  phacoemulsification with intraocular lens placement; Herreid photorefractive keratectomy; LASIK laser assisted in situ keratomileusis; HTN hypertension; DM diabetes mellitus; COPD chronic obstructive pulmonary disease

## 2021-12-23 ENCOUNTER — Encounter (INDEPENDENT_AMBULATORY_CARE_PROVIDER_SITE_OTHER): Payer: Self-pay | Admitting: Ophthalmology

## 2021-12-23 ENCOUNTER — Ambulatory Visit (INDEPENDENT_AMBULATORY_CARE_PROVIDER_SITE_OTHER): Payer: Medicare PPO | Admitting: Ophthalmology

## 2021-12-23 DIAGNOSIS — H353122 Nonexudative age-related macular degeneration, left eye, intermediate dry stage: Secondary | ICD-10-CM | POA: Diagnosis not present

## 2021-12-23 DIAGNOSIS — H26493 Other secondary cataract, bilateral: Secondary | ICD-10-CM

## 2021-12-23 DIAGNOSIS — H353211 Exudative age-related macular degeneration, right eye, with active choroidal neovascularization: Secondary | ICD-10-CM | POA: Diagnosis not present

## 2021-12-23 DIAGNOSIS — I10 Essential (primary) hypertension: Secondary | ICD-10-CM

## 2021-12-23 DIAGNOSIS — H35033 Hypertensive retinopathy, bilateral: Secondary | ICD-10-CM

## 2021-12-23 DIAGNOSIS — Z961 Presence of intraocular lens: Secondary | ICD-10-CM

## 2021-12-23 MED ORDER — AFLIBERCEPT 2MG/0.05ML IZ SOLN FOR KALEIDOSCOPE
2.0000 mg | INTRAVITREAL | Status: AC | PRN
  Administered 2021-12-23: 2 mg via INTRAVITREAL

## 2022-01-11 ENCOUNTER — Other Ambulatory Visit (INDEPENDENT_AMBULATORY_CARE_PROVIDER_SITE_OTHER): Payer: Medicare PPO

## 2022-01-11 DIAGNOSIS — E538 Deficiency of other specified B group vitamins: Secondary | ICD-10-CM

## 2022-01-11 DIAGNOSIS — E119 Type 2 diabetes mellitus without complications: Secondary | ICD-10-CM

## 2022-01-11 LAB — HEMOGLOBIN A1C: Hgb A1c MFr Bld: 6.2 % (ref 4.6–6.5)

## 2022-01-11 LAB — VITAMIN B12: Vitamin B-12: 812 pg/mL (ref 211–911)

## 2022-01-12 ENCOUNTER — Encounter: Payer: Self-pay | Admitting: Cardiology

## 2022-01-20 NOTE — Progress Notes (Signed)
[facility] Note  01/27/2022     CHIEF COMPLAINT Patient presents for Retina Follow Up  HISTORY OF PRESENT ILLNESS: Dana Sandoval is a 81 y.o. 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  Duration of 5 weeks.  Since onset it is stable.  I, the attending physician,  performed the HPI with the patient and updated documentation appropriately.        Comments   5 week follow up Exu ARMD OD-  Vision appears stable.      Last edited by Bernarda Caffey, MD on 01/27/2022  8:46 AM.      Referring physician: Tonia Ghent, MD [place],  [place] [address]  HISTORICAL INFORMATION:  Selected notes from the MEDICAL RECORD NUMBER Referred by Dr. Zenia Resides for eval of ret heme OD   CURRENT MEDICATIONS: No current outpatient medications on file. (Ophthalmic Drugs)   No current facility-administered medications for this visit. (Ophthalmic Drugs)   Current Outpatient Medications (Other)  Medication Sig   atorvastatin (LIPITOR) 40 MG tablet TAKE 1 TABLET BY MOUTH ONCE A DAY AT 6PM   ELIQUIS 5 MG TABS tablet TAKE 1 TABLET BY MOUTH TWICE A DAY   levothyroxine (SYNTHROID) 88 MCG tablet Take 1 tablet (88 mcg total) by mouth daily.   metFORMIN (GLUCOPHAGE) 500 MG tablet Take 1 tablet (500 mg total) by mouth 2 (two) times daily with a meal.   Multiple Vitamins-Minerals (PRESERVISION AREDS 2 PO) Take 1 tablet by mouth 2 (two) times daily.    ramipril (ALTACE) 10 MG capsule TAKE 1 CAPSULE BY MOUTH ONCE DAILY   valACYclovir (VALTREX) 500 MG tablet Take 500 mg by mouth daily as needed (fever blister).   vitamin B-12 (CYANOCOBALAMIN) 1000 MCG tablet Take 1 tablet (1,000 mcg total) by mouth daily.   VITAMIN D PO Take 1,000 Units by mouth daily.   Accu-Chek Softclix Lancets lancets Use to check blood sugar daily. Dx E11.9   Blood Glucose Monitoring Suppl (ACCU-CHEK GUIDE)  w/Device KIT Use to check blood sugar daily. Dx E11.9   glucose blood (ACCU-CHEK GUIDE) test strip Use to check blood sugar daily. Dx E11.9   No current facility-administered medications for this visit. (Other)   REVIEW OF SYSTEMS: ROS   Positive for: Endocrine, Cardiovascular, Eyes Negative for: Constitutional, Gastrointestinal, Neurological, Skin, Genitourinary, Musculoskeletal, HENT, Respiratory, Psychiatric, Allergic/Imm, Heme/Lymph Last edited by Leonie Douglas, COA on 01/27/2022  7:50 AM.     ALLERGIES Allergies  Allergen Reactions   Codeine Itching    After 3 days   Dilaudid [Hydromorphone Hcl] Itching    After 3 days   PAST MEDICAL HISTORY Past Medical History:  Diagnosis Date   Aortic stenosis    Arthritis    B12 deficiency    Diabetes mellitus    type 2   Heart murmur    aortic stenosis   Hyperlipidemia    Hypothyroidism    IBS (irritable bowel syndrome)    Macular degeneration    NSVD (normal spontaneous vaginal delivery)    X 4   Pain    INTRASCAPULAR PAIN   SAB (spontaneous abortion)    X 2   Thyroid disease    HYPOTHYROIDISM   Past Surgical History:  Procedure Laterality Date   ABDOMINAL HYSTERECTOMY  1977   TAH  (FIBROIDS)  CATARACT EXTRACTION Bilateral    DILATION AND CURETTAGE OF UTERUS     DOPPLER ECHOCARDIOGRAPHY  2009   HAD AN ECHOCARDIOGRAM THAT SHOWED MODERATE MITRAL  ANNULAR CALCIFICATION AND A NORMAL EJECTION FRACTION OF 55%   EYE SURGERY Bilateral    Cat Sx   OTHER SURGICAL HISTORY     HYSTERECTOMY   SHOULDER SURGERY     RIGHT SHOULDER SURGERY   THYROIDECTOMY     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   RIGHT   TOTAL KNEE ARTHROPLASTY Left 09/10/2018   Procedure: TOTAL KNEE ARTHROPLASTY;  Surgeon: Melrose Nakayama, MD;  Location: [facility];  Service: Orthopedics;  Laterality: Left;   FAMILY HISTORY Family History  Problem Relation Age of Onset   Osteoporosis Mother    Heart disease Mother    Macular degeneration Mother    Heart  disease Father    Cancer Father        BRAIN TUMOR   Cancer Brother        BRAIN TUMOR   Diabetes Maternal Aunt    Diabetes Maternal Uncle    Stroke Paternal Grandfather    Colon cancer Neg Hx    Breast cancer Neg Hx    Migraines Neg Hx    SOCIAL HISTORY Social History   Tobacco Use   Smoking status: Never   Smokeless tobacco: Never  Vaping Use   Vaping Use: Never used  Substance Use Topics   Alcohol use: No    Alcohol/week: 0.0 standard drinks of alcohol   Drug use: No       OPHTHALMIC EXAM: Base Eye Exam     Visual Acuity (Snellen - Linear)       Right Left   Dist [place] 20/40 -1 20/30   Dist ph [place] NI NI         Tonometry (Tonopen, 7:56 AM)       Right Left   Pressure 14 15         Pupils       Dark Light Shape React APD   Right 3 2 Round Brisk None   Left 3 2 Round Brisk None         Visual Fields (Counting fingers)       Left Right    Full Full         Extraocular Movement       Right Left    Full Full         Neuro/Psych     Oriented x3: Yes   Mood/Affect: Normal         Dilation     Both eyes: 1.0% Mydriacyl, 2.5% Phenylephrine @ 7:56 AM           Slit Lamp and Fundus Exam     Slit Lamp Exam       Right Left   Lids/Lashes Dermatochalasis - upper lid Dermatochalasis - upper lid   Conjunctiva/Sclera White and quiet White and quiet   Cornea 1+ Punctate epithelial erosions, fine endo pigment 1-2+ fine Punctate epithelial erosions, fine endo pigment   Anterior Chamber Deep and quiet Deep and quiet   Iris Round and dilated Round and dilated   Lens Toric PC IOL with marks at 1200 and 0600, 1+ Posterior capsular opacification nasal side approaching visual axis PC IOL in good position, 1-2+ Posterior capsular opacification   Anterior Vitreous Vitreous syneresis, trace pigment, Posterior vitreous detachment, vitreous condensations Vitreous syneresis, Posterior vitreous detachment, vitreous condensations  Fundus  Exam       Right Left   Disc mild Pallor, Sharp rim, +cupping, PPA mild Pallor, Sharp rim, +cupping, PPA   C/D Ratio 0.6 0.6   Macula Blunted foveal reflex, central CNV with subretinal heme-- stably resolved -- now with pigment ring, RPE mottling, clumping and atrophy, Drusen, shallow SRF overlying CNV -- stably resolved, cystic changes temporal mac - improved Flat, Blunted foveal reflex, Drusen, RPE mottling, clumping and atrophy, No heme or edema   Vessels attenuated attenuated   Periphery Attached, reticular degeneration, paving stone degeneration IT quad, No heme Attached, reticular degeneration, mild inferior paving stone degeneration, No heme           IMAGING AND PROCEDURES  Imaging and Procedures for 01/27/2022  OCT, Retina - OU - Both Eyes       Right Eye Quality was good. Central Foveal Thickness: 314. Progression has improved. Findings include no IRF, no SRF, abnormal foveal contour, retinal drusen , subretinal hyper-reflective material, pigment epithelial detachment, outer retinal atrophy (Stable improvement in SRF overlying PED; mild interval improvement in cystic changes / IRF temporal fovea overlying central PED / [facility]).   Left Eye Quality was good. Central Foveal Thickness: 222. Progression has been stable. Findings include normal foveal contour, no IRF, no SRF, retinal drusen , outer retinal atrophy.   Notes *Images captured and stored on drive  Diagnosis / Impression:  OD: Stable improvement in SRF overlying PED; mild interval improvement in cystic changes / IRF temporal fovea overlying central PED / SRHM OS: non-exu ARMD   Clinical management:  See below  Abbreviations: NFP - Normal foveal profile. CME - cystoid macular edema. PED - pigment epithelial detachment. IRF - intraretinal fluid. SRF - subretinal fluid. EZ - ellipsoid zone. ERM - epiretinal membrane. ORA - outer retinal atrophy. ORT - outer retinal tubulation. SRHM - subretinal hyper-reflective material.  IRHM - intraretinal hyper-reflective material      Intravitreal Injection, Pharmacologic Agent - OD - Right Eye       Time Out 01/27/2022. 8:25 AM. Confirmed correct patient, procedure, site, and patient consented.   Anesthesia Topical anesthesia was used. Anesthetic medications included Lidocaine 2%, Proparacaine 0.5%.   Procedure Preparation included 5% betadine to ocular surface, eyelid speculum. A (32g) needle was used.   Injection: 2 mg aflibercept 2 MG/0.05ML   Route: Intravitreal, Site: Right Eye   NDC: [id number], Lot: [id number], Expiration date: 11/07/2022, Waste: 0 mL   Post-op Post injection exam found visual acuity of at least counting fingers. The patient tolerated the procedure well. There were no complications. The patient received written and verbal post procedure care education. Post injection medications were not given.            ASSESSMENT/PLAN:   ICD-10-CM   1. Exudative age-related macular degeneration of right eye with active choroidal neovascularization (HCC)  H35.3211 OCT, Retina - OU - Both Eyes    Intravitreal Injection, Pharmacologic Agent - OD - Right Eye    aflibercept (EYLEA) SOLN 2 mg    2. Intermediate stage nonexudative age-related macular degeneration of left eye  H35.3122     3. Essential hypertension  I10     4. Hypertensive retinopathy of both eyes  H35.033     5. Pseudophakia, both eyes  Z96.1     6. PCO (posterior capsule opacification), bilateral  H26.493       1. Exudative age related macular degeneration, OD  - s/p IVA OD #1 (05.06.22), #2 (06.03.22), #3 (07.01.22), #  4 (07.29.22), #5 (08.26.22), #6 (09.23.22), #7 (10.21.22), #8 (11.18.22), #9 (12.16.22), #10 (01.20.23) -- IVA resistance  - s/p IVE OD #1 (02.24.23) -- sample, #2 (03.24.23), #3 (04.21.23), #4 (05.19.23), #5 (06.16.23)  - OCT shows stable improvement in SRF overlying PED, mild interval improvement in cystic changes / IRF temporal fovea overlying central PED /  [facility] at 5 weeks  - BCVA 20/40 OD  - recommend IVE OD #6 today, 07.21.23 -- w/ f/u in 6 wks - pt wishes to be treated with IVE - RBA of procedure discussed, questions answered - IVA informed consent obtained and signed, 05.06.22 (OD) - IVE informed consent obtained and signed, 02.24.23 (OD) - see procedure note  - f/u in 6 wks -- DFE/OCT, possible injection, tx and ext as able  2. Age related macular degeneration, non-exudative, OS  - The incidence, anatomy, and pathology of dry AMD, risk of progression, and the AREDS and AREDS 2 studies including smoking risks discussed with patient.    - Recommend amsler grid monitoring   3,4. Hypertensive retinopathy OU - discussed importance of tight BP control - monitor   5. Pseudophakia OU  - s/p CE/IOL  - IOL in good position, doing well  - monitor    6. PCO OU (OD > OS)  - monitor    Ophthalmic Meds Ordered this visit:  Meds ordered this encounter  Medications   aflibercept (EYLEA) SOLN 2 mg     Return in about 6 weeks (around 03/10/2022) for f/u exu ARMD OD, DFE, OCT.  There are no Patient Instructions on file for this visit.  This document serves as a record of services personally performed by Gardiner Sleeper, MD, PhD. It was created on their behalf by Leonie Douglas, an ophthalmic technician. The creation of this record is the provider's dictation and/or activities during the visit.    Electronically signed by: Leonie Douglas COA, 01/27/22  8:48 AM  This document serves as a record of services personally performed by Gardiner Sleeper, MD, PhD. It was created on their behalf by San Jetty. Owens Shark, OA an ophthalmic technician. The creation of this record is the provider's dictation and/or activities during the visit.    Electronically signed by: San Jetty. Owens Shark, [place] 07.21.2023 8:48 AM  Gardiner Sleeper, M.D., Ph.D. Diseases & Surgery of the Retina and Vitreous [facility]  I have reviewed the above documentation for  accuracy and completeness, and I agree with the above. Gardiner Sleeper, M.D., Ph.D. 01/27/22 8:55 AM   Abbreviations: M myopia (nearsighted); A astigmatism; H hyperopia (farsighted); P presbyopia; Mrx spectacle prescription;  CTL contact lenses; OD right eye; OS left eye; OU both eyes  XT exotropia; ET esotropia; PEK punctate epithelial keratitis; PEE punctate epithelial erosions; DES dry eye syndrome; MGD meibomian gland dysfunction; ATs artificial tears; PFAT's preservative free artificial tears; [place] nuclear sclerotic cataract; PSC posterior subcapsular cataract; ERM epi-retinal membrane; PVD posterior vitreous detachment; RD retinal detachment; DM diabetes mellitus; DR diabetic retinopathy; NPDR non-proliferative diabetic retinopathy; PDR proliferative diabetic retinopathy; CSME clinically significant macular edema; DME diabetic macular edema; dbh dot blot hemorrhages; CWS cotton wool spot; POAG primary open angle glaucoma; C/D cup-to-disc ratio; HVF humphrey visual field; GVF goldmann visual field; OCT optical coherence tomography; IOP intraocular pressure; BRVO Branch retinal vein occlusion; CRVO central retinal vein occlusion; CRAO central retinal artery occlusion; BRAO branch retinal artery occlusion; RT retinal tear; SB scleral buckle; PPV pars plana vitrectomy; VH Vitreous hemorrhage; PRP panretinal laser  photocoagulation; IVK intravitreal kenalog; VMT vitreomacular traction; MH Macular hole;  NVD neovascularization of the disc; NVE neovascularization elsewhere; AREDS age related eye disease study; ARMD age related macular degeneration; POAG primary open angle glaucoma; EBMD epithelial/anterior basement membrane dystrophy; ACIOL anterior chamber intraocular lens; IOL intraocular lens; PCIOL posterior chamber intraocular lens; Phaco/IOL phacoemulsification with intraocular lens placement; [place] photorefractive keratectomy; LASIK laser assisted in situ keratomileusis; HTN hypertension; DM diabetes mellitus;  COPD chronic obstructive pulmonary disease

## 2022-01-27 ENCOUNTER — Ambulatory Visit (INDEPENDENT_AMBULATORY_CARE_PROVIDER_SITE_OTHER): Payer: Medicare PPO | Admitting: Ophthalmology

## 2022-01-27 ENCOUNTER — Encounter (INDEPENDENT_AMBULATORY_CARE_PROVIDER_SITE_OTHER): Payer: Self-pay | Admitting: Ophthalmology

## 2022-01-27 DIAGNOSIS — I10 Essential (primary) hypertension: Secondary | ICD-10-CM

## 2022-01-27 DIAGNOSIS — H353211 Exudative age-related macular degeneration, right eye, with active choroidal neovascularization: Secondary | ICD-10-CM

## 2022-01-27 DIAGNOSIS — H35033 Hypertensive retinopathy, bilateral: Secondary | ICD-10-CM

## 2022-01-27 DIAGNOSIS — H353122 Nonexudative age-related macular degeneration, left eye, intermediate dry stage: Secondary | ICD-10-CM | POA: Diagnosis not present

## 2022-01-27 DIAGNOSIS — Z961 Presence of intraocular lens: Secondary | ICD-10-CM | POA: Diagnosis not present

## 2022-01-27 DIAGNOSIS — H26493 Other secondary cataract, bilateral: Secondary | ICD-10-CM | POA: Diagnosis not present

## 2022-01-27 MED ORDER — AFLIBERCEPT 2MG/0.05ML IZ SOLN FOR KALEIDOSCOPE
2.0000 mg | INTRAVITREAL | Status: AC | PRN
  Administered 2022-01-27: 2 mg via INTRAVITREAL

## 2022-03-06 NOTE — Progress Notes (Signed)
[facility] Note  03/10/2022     CHIEF COMPLAINT Patient presents for Retina Follow Up  HISTORY OF PRESENT ILLNESS: Dana Sandoval is a 81 y.o. 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  This started 6 weeks ago.  I, the attending physician,  performed the HPI with the patient and updated documentation appropriately.        Comments   Patient here for 6 weeks retina follow up for exu ARMD OD. Patient states vision about the same. No difference. Some days better than others. No eye pain.       Last edited by Bernarda Caffey, MD on 03/10/2022 11:34 AM.    Pt states vision is the same  Referring physician: Tonia Ghent, MD [place],  [place] [address]  HISTORICAL INFORMATION:  Selected notes from the MEDICAL RECORD NUMBER Referred by Dr. Zenia Resides for eval of ret heme OD   CURRENT MEDICATIONS: No current outpatient medications on file. (Ophthalmic Drugs)   No current facility-administered medications for this visit. (Ophthalmic Drugs)   Current Outpatient Medications (Other)  Medication Sig   Accu-Chek Softclix Lancets lancets Use to check blood sugar daily. Dx E11.9   atorvastatin (LIPITOR) 40 MG tablet TAKE 1 TABLET BY MOUTH ONCE A DAY AT 6PM   Blood Glucose Monitoring Suppl (ACCU-CHEK GUIDE) w/Device KIT Use to check blood sugar daily. Dx E11.9   ELIQUIS 5 MG TABS tablet TAKE 1 TABLET BY MOUTH TWICE A DAY   glucose blood (ACCU-CHEK GUIDE) test strip Use to check blood sugar daily. Dx E11.9   levothyroxine (SYNTHROID) 88 MCG tablet Take 1 tablet (88 mcg total) by mouth daily.   metFORMIN (GLUCOPHAGE) 500 MG tablet Take 1 tablet (500 mg total) by mouth 2 (two) times daily with a meal.   Multiple Vitamins-Minerals (PRESERVISION AREDS 2 PO) Take 1 tablet by mouth 2 (two) times daily.    ramipril (ALTACE) 10 MG capsule TAKE 1 CAPSULE BY MOUTH ONCE  DAILY   valACYclovir (VALTREX) 500 MG tablet Take 500 mg by mouth daily as needed (fever blister).   vitamin B-12 (CYANOCOBALAMIN) 1000 MCG tablet Take 1 tablet (1,000 mcg total) by mouth daily.   VITAMIN D PO Take 1,000 Units by mouth daily.   No current facility-administered medications for this visit. (Other)   REVIEW OF SYSTEMS: ROS   Positive for: Endocrine, Cardiovascular, Eyes Negative for: Constitutional, Gastrointestinal, Neurological, Skin, Genitourinary, Musculoskeletal, HENT, Respiratory, Psychiatric, Allergic/Imm, Heme/Lymph Last edited by Theodore Demark, COA on 03/10/2022  7:49 AM.     ALLERGIES Allergies  Allergen Reactions   Codeine Itching    After 3 days   Dilaudid [Hydromorphone Hcl] Itching    After 3 days   PAST MEDICAL HISTORY Past Medical History:  Diagnosis Date   Aortic stenosis    Arthritis    B12 deficiency    Diabetes mellitus    type 2   Heart murmur    aortic stenosis   Hyperlipidemia    Hypothyroidism    IBS (irritable bowel syndrome)    Macular degeneration    NSVD (normal spontaneous vaginal delivery)    X 4   Pain    INTRASCAPULAR PAIN   SAB (spontaneous abortion)    X 2   Thyroid disease    HYPOTHYROIDISM   Past Surgical  History:  Procedure Laterality Date   ABDOMINAL HYSTERECTOMY  1977   TAH  (FIBROIDS)   CATARACT EXTRACTION Bilateral    DILATION AND CURETTAGE OF UTERUS     DOPPLER ECHOCARDIOGRAPHY  2009   HAD AN ECHOCARDIOGRAM THAT SHOWED MODERATE MITRAL  ANNULAR CALCIFICATION AND A NORMAL EJECTION FRACTION OF 55%   EYE SURGERY Bilateral    Cat Sx   OTHER SURGICAL HISTORY     HYSTERECTOMY   SHOULDER SURGERY     RIGHT SHOULDER SURGERY   THYROIDECTOMY     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   RIGHT   TOTAL KNEE ARTHROPLASTY Left 09/10/2018   Procedure: TOTAL KNEE ARTHROPLASTY;  Surgeon: Melrose Nakayama, MD;  Location: [facility];  Service: Orthopedics;  Laterality: Left;   FAMILY HISTORY Family History  Problem Relation  Age of Onset   Osteoporosis Mother    Heart disease Mother    Macular degeneration Mother    Heart disease Father    Cancer Father        BRAIN TUMOR   Cancer Brother        BRAIN TUMOR   Diabetes Maternal Aunt    Diabetes Maternal Uncle    Stroke Paternal Grandfather    Colon cancer Neg Hx    Breast cancer Neg Hx    Migraines Neg Hx    SOCIAL HISTORY Social History   Tobacco Use   Smoking status: Never   Smokeless tobacco: Never  Vaping Use   Vaping Use: Never used  Substance Use Topics   Alcohol use: No    Alcohol/week: 0.0 standard drinks of alcohol   Drug use: No       OPHTHALMIC EXAM: Base Eye Exam     Visual Acuity (Snellen - Linear)       Right Left   Dist Alpha 20/40 -2 20/30 -2   Dist ph [place] 20/40 +1 NI         Tonometry (Tonopen, 7:47 AM)       Right Left   Pressure 12 15         Pupils       Dark Light Shape React APD   Right 3 2 Round Brisk None   Left 3 2 Round Brisk None         Visual Fields (Counting fingers)       Left Right    Full Full         Extraocular Movement       Right Left    Full, Ortho Full, Ortho         Neuro/Psych     Oriented x3: Yes   Mood/Affect: Normal         Dilation     Both eyes: 1.0% Mydriacyl, 2.5% Phenylephrine @ 7:47 AM           Slit Lamp and Fundus Exam     Slit Lamp Exam       Right Left   Lids/Lashes Dermatochalasis - upper lid Dermatochalasis - upper lid   Conjunctiva/Sclera White and quiet White and quiet   Cornea 1+ Punctate epithelial erosions, fine endo pigment 1-2+ fine Punctate epithelial erosions, fine endo pigment   Anterior Chamber Deep and quiet Deep and quiet   Iris Round and dilated Round and dilated   Lens Toric PC IOL with marks at 1200 and 0600, 1+ Posterior capsular opacification nasal side approaching visual axis PC IOL in good position, 1-2+ Posterior capsular opacification   Anterior Vitreous  Vitreous syneresis, trace pigment, Posterior vitreous  detachment, vitreous condensations Vitreous syneresis, Posterior vitreous detachment, vitreous condensations         Fundus Exam       Right Left   Disc mild Pallor, Sharp rim, +cupping, PPA mild Pallor, Sharp rim, +cupping, PPA   C/D Ratio 0.6 0.6   Macula Blunted foveal reflex, central CNV with subretinal heme-- stably resolved -- now with pigment ring, RPE mottling, clumping and atrophy, Drusen, shallow SRF overlying CNV -- stably resolved, cystic changes temporal mac - improved Flat, Blunted foveal reflex, Drusen, RPE mottling, clumping and atrophy, No heme or edema   Vessels attenuated attenuated   Periphery Attached, reticular degeneration, paving stone degeneration IT quad, No heme Attached, reticular degeneration, mild inferior paving stone degeneration, No heme           IMAGING AND PROCEDURES  Imaging and Procedures for 03/10/2022  OCT, Retina - OU - Both Eyes       Right Eye Quality was good. Central Foveal Thickness: 321. Progression has improved. Findings include no IRF, no SRF, abnormal foveal contour, retinal drusen , subretinal hyper-reflective material, pigment epithelial detachment, outer retinal atrophy (Stable improvement in SRF overlying PED; mild interval improvement in cystic changes / IRF temporal fovea overlying central PED / [facility]).   Left Eye Quality was good. Central Foveal Thickness: 222. Progression has been stable. Findings include normal foveal contour, no IRF, no SRF, retinal drusen , outer retinal atrophy.   Notes *Images captured and stored on drive  Diagnosis / Impression:  OD: Stable improvement in SRF overlying PED; mild interval improvement in cystic changes / IRF temporal fovea overlying central PED / SRHM OS: non-exu ARMD   Clinical management:  See below  Abbreviations: NFP - Normal foveal profile. CME - cystoid macular edema. PED - pigment epithelial detachment. IRF - intraretinal fluid. SRF - subretinal fluid. EZ - ellipsoid zone. ERM  - epiretinal membrane. ORA - outer retinal atrophy. ORT - outer retinal tubulation. SRHM - subretinal hyper-reflective material. IRHM - intraretinal hyper-reflective material      Intravitreal Injection, Pharmacologic Agent - OD - Right Eye       Time Out 03/10/2022. 7:54 AM. Confirmed correct patient, procedure, site, and patient consented.   Anesthesia Topical anesthesia was used. Anesthetic medications included Lidocaine 2%, Proparacaine 0.5%.   Procedure Preparation included 5% betadine to ocular surface, eyelid speculum. A (32g) needle was used.   Injection: 2 mg aflibercept 2 MG/0.05ML   Route: Intravitreal, Site: Right Eye   NDC: [id number], Lot: [id number], Expiration date: 11/07/2022, Waste: 0 mL   Post-op Post injection exam found visual acuity of at least counting fingers. The patient tolerated the procedure well. There were no complications. The patient received written and verbal post procedure care education. Post injection medications were not given.             ASSESSMENT/PLAN:   ICD-10-CM   1. Exudative age-related macular degeneration of right eye with active choroidal neovascularization (HCC)  H35.3211 OCT, Retina - OU - Both Eyes    Intravitreal Injection, Pharmacologic Agent - OD - Right Eye    aflibercept (EYLEA) SOLN 2 mg    2. Intermediate stage nonexudative age-related macular degeneration of left eye  H35.3122     3. Essential hypertension  I10     4. Hypertensive retinopathy of both eyes  H35.033     5. Pseudophakia, both eyes  Z96.1     6. PCO (posterior capsule opacification), bilateral  H26.493       1. Exudative age related macular degeneration, OD  - s/p IVA OD #1 (05.06.22), #2 (06.03.22), #3 (07.01.22), #4 (07.29.22), #5 (08.26.22), #6 (09.23.22), #7 (10.21.22), #8 (11.18.22), #9 (12.16.22), #10 (01.20.23) -- IVA resistance  - s/p IVE OD #1 (02.24.23) -- sample, #2 (03.24.23), #3 (04.21.23), #4 (05.19.23), #5 (06.16.23), #6  (07.21.23)  - OCT shows stable improvement in SRF overlying PED, mild interval improvement in cystic changes / IRF temporal fovea overlying central PED / SRHM at 6 weeks  - BCVA 20/40 OD  - recommend IVE OD #7 today, 09.01.23 -- w/ ext f/u to 8 wks - pt wishes to be treated with IVE - RBA of procedure discussed, questions answered - IVA informed consent obtained and signed, 05.06.22 (OD) - IVE informed consent obtained and signed, 02.24.23 (OD) - see procedure note  - f/u in 8 wks -- DFE/OCT, possible injection, tx and ext as able  2. Age related macular degeneration, non-exudative, OS  - The incidence, anatomy, and pathology of dry AMD, risk of progression, and the AREDS and AREDS 2 studies including smoking risks discussed with patient.    - Recommend amsler grid monitoring   3,4. Hypertensive retinopathy OU - discussed importance of tight BP control - monitor   5. Pseudophakia OU  - s/p CE/IOL  - IOL in good position, doing well  - monitor    6. PCO OU (OD > OS)  - monitor    Ophthalmic Meds Ordered this visit:  Meds ordered this encounter  Medications   aflibercept (EYLEA) SOLN 2 mg     Return in about 8 weeks (around 05/05/2022) for f/u exu ARMD OD, DFE, OCT.  There are no Patient Instructions on file for this visit.  This document serves as a record of services personally performed by Gardiner Sleeper, MD, PhD. It was created on their behalf by San Jetty. Owens Shark, OA an ophthalmic technician. The creation of this record is the provider's dictation and/or activities during the visit.    Electronically signed by: San Jetty. Owens Shark, [place] 08.28.2023 11:47 AM  Gardiner Sleeper, M.D., Ph.D. Diseases & Surgery of the Retina and Vitreous [facility]  I have reviewed the above documentation for accuracy and completeness, and I agree with the above. Gardiner Sleeper, M.D., Ph.D. 03/10/22 11:47 AM   Abbreviations: M myopia (nearsighted); A astigmatism; H  hyperopia (farsighted); P presbyopia; Mrx spectacle prescription;  CTL contact lenses; OD right eye; OS left eye; OU both eyes  XT exotropia; ET esotropia; PEK punctate epithelial keratitis; PEE punctate epithelial erosions; DES dry eye syndrome; MGD meibomian gland dysfunction; ATs artificial tears; PFAT's preservative free artificial tears; [place] nuclear sclerotic cataract; PSC posterior subcapsular cataract; ERM epi-retinal membrane; PVD posterior vitreous detachment; RD retinal detachment; DM diabetes mellitus; DR diabetic retinopathy; NPDR non-proliferative diabetic retinopathy; PDR proliferative diabetic retinopathy; CSME clinically significant macular edema; DME diabetic macular edema; dbh dot blot hemorrhages; CWS cotton wool spot; POAG primary open angle glaucoma; C/D cup-to-disc ratio; HVF humphrey visual field; GVF goldmann visual field; OCT optical coherence tomography; IOP intraocular pressure; BRVO Branch retinal vein occlusion; CRVO central retinal vein occlusion; CRAO central retinal artery occlusion; BRAO branch retinal artery occlusion; RT retinal tear; SB scleral buckle; PPV pars plana vitrectomy; VH Vitreous hemorrhage; PRP panretinal laser photocoagulation; IVK intravitreal kenalog; VMT vitreomacular traction; MH Macular hole;  NVD neovascularization of the disc; NVE neovascularization elsewhere; AREDS age related eye disease study; ARMD age related  macular degeneration; POAG primary open angle glaucoma; EBMD epithelial/anterior basement membrane dystrophy; ACIOL anterior chamber intraocular lens; IOL intraocular lens; PCIOL posterior chamber intraocular lens; Phaco/IOL phacoemulsification with intraocular lens placement; [place] photorefractive keratectomy; LASIK laser assisted in situ keratomileusis; HTN hypertension; DM diabetes mellitus; COPD chronic obstructive pulmonary disease

## 2022-03-10 ENCOUNTER — Encounter (INDEPENDENT_AMBULATORY_CARE_PROVIDER_SITE_OTHER): Payer: Self-pay | Admitting: Ophthalmology

## 2022-03-10 ENCOUNTER — Ambulatory Visit (INDEPENDENT_AMBULATORY_CARE_PROVIDER_SITE_OTHER): Payer: Medicare PPO | Admitting: Ophthalmology

## 2022-03-10 DIAGNOSIS — Z961 Presence of intraocular lens: Secondary | ICD-10-CM

## 2022-03-10 DIAGNOSIS — H35033 Hypertensive retinopathy, bilateral: Secondary | ICD-10-CM

## 2022-03-10 DIAGNOSIS — H26493 Other secondary cataract, bilateral: Secondary | ICD-10-CM

## 2022-03-10 DIAGNOSIS — H353122 Nonexudative age-related macular degeneration, left eye, intermediate dry stage: Secondary | ICD-10-CM

## 2022-03-10 DIAGNOSIS — I10 Essential (primary) hypertension: Secondary | ICD-10-CM | POA: Diagnosis not present

## 2022-03-10 DIAGNOSIS — H353211 Exudative age-related macular degeneration, right eye, with active choroidal neovascularization: Secondary | ICD-10-CM | POA: Diagnosis not present

## 2022-03-10 MED ORDER — AFLIBERCEPT 2MG/0.05ML IZ SOLN FOR KALEIDOSCOPE
2.0000 mg | INTRAVITREAL | Status: AC | PRN
  Administered 2022-03-10: 2 mg via INTRAVITREAL

## 2022-04-10 ENCOUNTER — Other Ambulatory Visit: Payer: Self-pay

## 2022-04-10 ENCOUNTER — Encounter: Payer: Self-pay | Admitting: Cardiology

## 2022-04-10 ENCOUNTER — Ambulatory Visit: Payer: Medicare PPO | Admitting: Cardiology

## 2022-04-10 VITALS — BP 137/69 | HR 75 | Temp 98.1°F | Resp 16 | Ht 62.0 in | Wt 168.4 lb

## 2022-04-10 DIAGNOSIS — I480 Paroxysmal atrial fibrillation: Secondary | ICD-10-CM

## 2022-04-10 DIAGNOSIS — G459 Transient cerebral ischemic attack, unspecified: Secondary | ICD-10-CM

## 2022-04-10 DIAGNOSIS — I10 Essential (primary) hypertension: Secondary | ICD-10-CM

## 2022-04-10 DIAGNOSIS — E782 Mixed hyperlipidemia: Secondary | ICD-10-CM | POA: Diagnosis not present

## 2022-04-10 MED ORDER — APIXABAN 5 MG PO TABS: 5.0000 mg | ORAL_TABLET | Freq: Two times a day (BID) | ORAL | 3 refills | Status: DC

## 2022-04-10 NOTE — Progress Notes (Signed)
Primary Physician/Referring:  Tonia Ghent, MD  Patient ID: Dana Sandoval, female    DOB: 1941/06/21, 81 y.o.   MRN: [id number]  Chief Complaint  Patient presents with   Aortic valve disorder   Hypertension   Follow-up    1 year   HPI:    Hennesy B Borah  is a 81 y.o. Caucasian female with history of hyperlipidemia, controlled diabetes, HTN, hypothyroidism, and mild to moderate aortic stenosis and mild to moderate AI, mild to moderate mitral regurgitation, abdominal atherosclerosis and abdominal ectasia.  Patient presented with word finding difficulty and visual disturbance in the form of zigzag lines on the right visual field on 09/29/2021 to the emergency room after the event had lasted for about an hour the previous day and she was completely asymptomatic upon presentation to the emergency room the following day.  She was discharged home after routine testing did not reveal any etiology, symptoms felt to be either TIA versus complex migraine.  She has not had any recurrence since then.    Zio patch 14-day monitoring revealed paroxysmal episodes of atrial fibrillation and was started on Eliquis which she is tolerating.  Otherwise, presently asymptomatic.     Past Medical History:  Diagnosis Date   Aortic stenosis    Arthritis    B12 deficiency    Diabetes mellitus    type 2   Heart murmur    aortic stenosis   Hyperlipidemia    Hypothyroidism    IBS (irritable bowel syndrome)    Macular degeneration    Macular degeneration of right eye    NSVD (normal spontaneous vaginal delivery)    X 4   Pain    INTRASCAPULAR PAIN   SAB (spontaneous abortion)    X 2   Thyroid disease    HYPOTHYROIDISM   Past Surgical History:  Procedure Laterality Date   ABDOMINAL HYSTERECTOMY  1977   TAH  (FIBROIDS)   CATARACT EXTRACTION Bilateral    DILATION AND CURETTAGE OF UTERUS     DOPPLER ECHOCARDIOGRAPHY  2009   HAD AN ECHOCARDIOGRAM THAT SHOWED MODERATE MITRAL  ANNULAR  CALCIFICATION AND A NORMAL EJECTION FRACTION OF 55%   EYE SURGERY Bilateral    Cat Sx   OTHER SURGICAL HISTORY     HYSTERECTOMY   SHOULDER SURGERY     RIGHT SHOULDER SURGERY   THYROIDECTOMY     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   RIGHT   TOTAL KNEE ARTHROPLASTY Left 09/10/2018   Procedure: TOTAL KNEE ARTHROPLASTY;  Surgeon: Melrose Nakayama, MD;  Location: [facility];  Service: Orthopedics;  Laterality: Left;   Social History   Tobacco Use   Smoking status: Never   Smokeless tobacco: Never  Substance Use Topics   Alcohol use: No    Alcohol/week: 0.0 standard drinks of alcohol   Marital Status: Married   ROS  Review of Systems  Cardiovascular:  Negative for chest pain, dyspnea on exertion and leg swelling.  Musculoskeletal:  Positive for arthritis.  Gastrointestinal:  Negative for melena.   Objective      04/10/2022    8:51 AM 11/09/2021    7:45 AM 10/18/2021    2:48 PM  Vitals with BMI  Height 5' 2" 5' 2" 5' 2"  Weight 168 lbs 6 oz 167 lbs 6 oz 168 lbs  BMI 30.79 69.48 54.62  Systolic 703 500 938  Diastolic 69 64 64  Pulse 75 71 77    Blood pressure 137/69, pulse 75, temperature 98.1  F (36.7 C), temperature source Temporal, resp. rate 16, height 5' 2" (1.575 m), weight 168 lb 6.4 oz (76.4 kg), SpO2 96 %. Body mass index is 30.8 kg/m.   Physical Exam Constitutional:      Appearance: She is well-developed.  Neck:     Vascular: No JVD.  Cardiovascular:     Rate and Rhythm: Normal rate and regular rhythm.     Pulses: Normal pulses and intact distal pulses.          Carotid pulses are  on the right side with bruit and  on the left side with bruit.    Heart sounds: Murmur heard.     Harsh midsystolic murmur is present with a grade of 2/6 at the upper right sternal border radiating to the neck.     High-pitched blowing decrescendo early diastolic murmur is present with a grade of 2/4 at the upper right sternal border radiating to the apex.     No gallop.  Pulmonary:      Effort: Pulmonary effort is normal.     Breath sounds: Normal breath sounds.  Abdominal:     General: Bowel sounds are normal.     Palpations: Abdomen is soft.  Musculoskeletal:     Right lower leg: No edema.     Left lower leg: No edema.    Laboratory examination:   Recent Labs    09/08/21 0758 09/28/21 1200 09/28/21 1217  NA 141 142 142  K 3.9 3.9 4.0  CL 104 108 103  CO2 29 26  --   GLUCOSE 160* 141* 135*  BUN _0 CREATININE 0.88 0.83 0.80  CALCIUM 8.9 9.3  --   GFRNONAA  --  >60  --        Latest Ref Rng & Units 09/28/2021   12:17 PM 09/28/2021   12:00 PM 09/08/2021    7:58 AM  CMP  Glucose 70 - 99 mg/dL 135  141  160   BUN 8 - 23 mg/dL _1 Creatinine 0.44 - 1.00 mg/dL 0.80  0.83  0.88   Sodium 135 - 145 mmol/L 142  142  141   Potassium 3.5 - 5.1 mmol/L 4.0  3.9  3.9   Chloride 98 - 111 mmol/L 103  108  104   CO2 22 - 32 mmol/L  26  29   Calcium 8.9 - 10.3 mg/dL  9.3  8.9   Total Protein 6.5 - 8.1 g/dL  6.1  6.4   Total Bilirubin 0.3 - 1.2 mg/dL  0.6  0.5   Alkaline Phos 38 - 126 U/L  54  67   AST 15 - 41 U/L  19  15   ALT 0 - 44 U/L  13  10       Latest Ref Rng & Units 09/28/2021   12:17 PM 09/28/2021   12:00 PM 03/08/2020    8:19 AM  CBC  WBC 4.0 - 10.5 K/uL  8.1  12.2   Hemoglobin 12.0 - 15.0 g/dL 11.2  11.5  12.9   Hematocrit 36.0 - 46.0 % 33.0  34.4  38.1   Platelets 150 - 400 K/uL  388  458.0    Lipid Panel     Component Value Date/Time   CHOL 126 09/29/2021 0102   TRIG 106 09/29/2021 0102   HDL 31 (L) 09/29/2021 0102   CHOLHDL 4.1 09/29/2021 0102   VLDL 21 09/29/2021 0102   LDLCALC  74 09/29/2021 0102     HEMOGLOBIN A1C Lab Results  Component Value Date   HGBA1C 6.2 01/11/2022   MPG 120 (H) 07/13/2010   TSH Recent Labs    09/08/21 0758  TSH 1.24   Medications   Allergies  Allergen Reactions   Codeine Itching    After 3 days   Dilaudid [Hydromorphone Hcl] Itching    After 3 days     Current Outpatient  Medications:    Accu-Chek Softclix Lancets lancets, Use to check blood sugar daily. Dx E11.9, Disp: 100 each, Rfl: 12   atorvastatin (LIPITOR) 40 MG tablet, TAKE 1 TABLET BY MOUTH ONCE A DAY AT 6PM, Disp: 90 tablet, Rfl: 3   Blood Glucose Monitoring Suppl (ACCU-CHEK GUIDE) w/Device KIT, Use to check blood sugar daily. Dx E11.9, Disp: 1 kit, Rfl: 0   ELIQUIS 5 MG TABS tablet, TAKE 1 TABLET BY MOUTH TWICE A DAY, Disp: 60 tablet, Rfl: 3   glucose blood (ACCU-CHEK GUIDE) test strip, Use to check blood sugar daily. Dx E11.9, Disp: 100 each, Rfl: 12   levothyroxine (SYNTHROID) 88 MCG tablet, Take 1 tablet (88 mcg total) by mouth daily., Disp: 90 tablet, Rfl: 3   metFORMIN (GLUCOPHAGE) 500 MG tablet, Take 1 tablet (500 mg total) by mouth 2 (two) times daily with a meal., Disp: 180 tablet, Rfl: 3   Multiple Vitamins-Minerals (PRESERVISION AREDS 2 PO), Take 1 tablet by mouth 2 (two) times daily. , Disp: , Rfl:    ramipril (ALTACE) 10 MG capsule, TAKE 1 CAPSULE BY MOUTH ONCE DAILY, Disp: 90 capsule, Rfl: 3   valACYclovir (VALTREX) 500 MG tablet, Take 500 mg by mouth daily as needed (fever blister)., Disp: , Rfl:    vitamin B-12 (CYANOCOBALAMIN) 1000 MCG tablet, Take 1 tablet (1,000 mcg total) by mouth daily., Disp: , Rfl:    VITAMIN D PO, Take 1,000 Units by mouth daily., Disp: , Rfl:      Radiology:  CT angiogram head and neck 09/28/2021: No emergent large vessel occlusion or high-grade stenosis of the intracranial or cervical arteries.  MRI of the brain without contrast 09/28/2021: Mild atrophy and mild chronic microvascular ischemic change in the white matter.  Cardiac Studies:   Carotid artery duplex  04/03/19  No hemodynamically significant arterial disease in the internal carotid artery bilaterally.  Antegrade right vertebral artery flow. Antegrade left vertebral artery flow. Compared to 04/01/2018, right ICA stenosis of 15-49% not present.   Abdominal Aortic Duplex  04/16/2020:  Mild plaque  observed in the mid aorta. Normal flow velocities noted.  Ectasia in the mid aorta measuring 2.26 x 2.21 x 2.2 cm is seen.  No AAA.   Echocardiogram 09/29/2021:   1. Left ventricular ejection fraction, by estimation, is 55 to 60%. The left ventricle has normal function. The left ventricle has no regional wall motion abnormalities. There is mild left ventricular hypertrophy. Left ventricular diastolic function  could not be evaluated due to mitral annular calcification;however, based on diastolic parameter normal LAP.  2. Right ventricular systolic function is normal. The right ventricular size is normal. There is normal pulmonary artery systolic pressure.  3. Left atrial size was moderately dilated.  4. Mild mitral subvalvular calcification.  5. The mitral valve is degenerative. Moderate mitral valve regurgitation. No evidence of mitral stenosis. Moderate mitral annular calcification.  6. Moderate aortic valve stenosis (peak velocity 3.55ms, Mean PG 25 mm Hg, AVA VTI 0.61cm2, DI 0.3). The aortic valve was not well visualized. Aortic valve regurgitation is  mild to moderate.  7. The inferior vena cava is normal in size with greater than 50% respiratory variability, suggesting right atrial pressure of 3 mmHg.  Zio Patch Extended out patient EKG monitoring 13 days starting 10/18/2021: Patient had a min HR of 54 bpm, max HR of 191 bpm, and avg HR of 81 bpm. Predominant underlying rhythm was Sinus Rhythm.  1 run of Ventricular Tachycardia occurred lasting 4 beats with a max rate of 124 bpm (avg 117 bpm).  Atrial Fibrillation occurred (5% burden), ranging from 65-191 bpm (avg of 128 bpm), the longest lasting 16 hours 12 mins with an avg rate of 128 bpm. Isolated SVEs were frequent (8.6%, 131424), rare atrial couplets and triplets. Rare PVCs, ventricular couplets and trigeminy. No symptoms reported.  EKG   EKG 10/18/2021: Normal sinus rhythm at rate of 76 bpm, normal EKG. no significant change from  04/08/2021.   Assessment     ICD-10-CM   1. TIA (transient ischemic attack)  G45.9     2. Paroxysmal atrial fibrillation (HCC)  I48.0     3. Primary hypertension  I10     4. Mixed hyperlipidemia  E78.2       CHA2DS2-VASc Score is 7.  Yearly risk of stroke: 10% (A, F, HTN, DM, TIA).  Score of 1=0.6; 2=2.2; 3=3.2; 4=4.8; 5=7.2; 6=9.8; 7=>9.8) -(CHF; HTN; vasc disease DM,  Female = 1; Age <65 =0; 65-74 = 1,  >75 =2; stroke/embolism= 2).   No orders of the defined types were placed in this encounter.  There are no discontinued medications.    Recommendations:   Stashia B Sevillano  is a 81 y.o. Caucasian female with history of hyperlipidemia, controlled diabetes, HTN, hypothyroidism, and mild to moderate aortic stenosis and mild to moderate AI, mild to moderate mitral regurgitation, abdominal atherosclerosis and abdominal ectasia.  Patient presented with word finding difficulty and visual disturbance in the form of zigzag lines on the right visual field on 09/29/2021 to the emergency room.   My suspicion for atrial fibrillation was fairly high, Zio patch monitoring on 10/18/2021 confirmed paroxysmal atrial fibrillation with a 5% burden, longest lasting 16 hours.  Hence, presently on Eliquis.  She is tolerating anticoagulation without bleeding complications.  Otherwise her cardiovascular risk factors including lipids and hypertension are well controlled.  Continue the same, I will see her back in 6 months or sooner if problems.  She would be a good candidate for OCEANIC-AF (Asundexian - factor XIa inhibitor PO BID vs Apixaban PO BID in patients with A. Fib for stroke prevention.   I have answered all the questions regarding long-term anticoagulation, risks of bleeding and benefits of being on anticoagulation.  Normally we need to have >24 hours of atrial fibrillation to be documented prior to initiation of anticoagulation.  However with the presentation very consistent with TIA, frequent PACs on  EKG, age and hypertension as risk factors, she is at extreme high risk for recurrence of atrial fibrillation and recurrence of stroke.   Adrian Prows, MD, [facility] 04/10/2022, 8:52 AM Office: [phone]

## 2022-05-01 NOTE — Progress Notes (Signed)
[facility] Note  05/05/2022     CHIEF COMPLAINT Patient presents for Retina Follow Up  HISTORY OF PRESENT ILLNESS: Dana Sandoval is a 81 y.o. 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  This started 8 weeks ago.  I, the attending physician,  performed the HPI with the patient and updated documentation appropriately.        Comments   Patient here for 6 weeks retina follow up for exu ARMD OD. Patient states vision about the same. No difference. No eye pain.       Last edited by Bernarda Caffey, MD on 05/05/2022  8:14 PM.     Referring physician: Tonia Ghent, MD [place],  [place] [id number]  HISTORICAL INFORMATION:  Selected notes from the MEDICAL RECORD NUMBER Referred by Dr. Zenia Resides for eval of ret heme OD   CURRENT MEDICATIONS: No current outpatient medications on file. (Ophthalmic Drugs)   No current facility-administered medications for this visit. (Ophthalmic Drugs)   Current Outpatient Medications (Other)  Medication Sig   Accu-Chek Softclix Lancets lancets Use to check blood sugar daily. Dx E11.9   apixaban (ELIQUIS) 5 MG TABS tablet Take 1 tablet (5 mg total) by mouth 2 (two) times daily.   atorvastatin (LIPITOR) 40 MG tablet TAKE 1 TABLET BY MOUTH ONCE A DAY AT 6PM   Blood Glucose Monitoring Suppl (ACCU-CHEK GUIDE) w/Device KIT Use to check blood sugar daily. Dx E11.9   glucose blood (ACCU-CHEK GUIDE) test strip Use to check blood sugar daily. Dx E11.9   levothyroxine (SYNTHROID) 88 MCG tablet Take 1 tablet (88 mcg total) by mouth daily.   metFORMIN (GLUCOPHAGE) 500 MG tablet Take 1 tablet (500 mg total) by mouth 2 (two) times daily with a meal.   Multiple Vitamins-Minerals (PRESERVISION AREDS 2 PO) Take 1 tablet by mouth 2 (two) times daily.    ramipril (ALTACE) 10 MG capsule TAKE 1 CAPSULE BY MOUTH ONCE DAILY   valACYclovir  (VALTREX) 500 MG tablet Take 500 mg by mouth daily as needed (fever blister).   vitamin B-12 (CYANOCOBALAMIN) 1000 MCG tablet Take 1 tablet (1,000 mcg total) by mouth daily.   VITAMIN D PO Take 1,000 Units by mouth daily.   No current facility-administered medications for this visit. (Other)   REVIEW OF SYSTEMS: ROS   Positive for: Endocrine, Cardiovascular, Eyes Negative for: Constitutional, Gastrointestinal, Neurological, Skin, Genitourinary, Musculoskeletal, HENT, Respiratory, Psychiatric, Allergic/Imm, Heme/Lymph Last edited by Theodore Demark, COA on 05/05/2022  7:49 AM.     ALLERGIES Allergies  Allergen Reactions   Codeine Itching    After 3 days   Dilaudid [Hydromorphone Hcl] Itching    After 3 days   PAST MEDICAL HISTORY Past Medical History:  Diagnosis Date   Aortic stenosis    Arthritis    B12 deficiency    Diabetes mellitus    type 2   Heart murmur    aortic stenosis   Hyperlipidemia    Hypothyroidism    IBS (irritable bowel syndrome)    Macular degeneration    Macular degeneration of right eye    NSVD (normal spontaneous vaginal delivery)    X 4   Pain    INTRASCAPULAR PAIN   SAB (spontaneous abortion)    X 2   Thyroid disease    HYPOTHYROIDISM  Past Surgical History:  Procedure Laterality Date   ABDOMINAL HYSTERECTOMY  1977   TAH  (FIBROIDS)   CATARACT EXTRACTION Bilateral    DILATION AND CURETTAGE OF UTERUS     DOPPLER ECHOCARDIOGRAPHY  2009   HAD AN ECHOCARDIOGRAM THAT SHOWED MODERATE MITRAL  ANNULAR CALCIFICATION AND A NORMAL EJECTION FRACTION OF 55%   EYE SURGERY Bilateral    Cat Sx   OTHER SURGICAL HISTORY     HYSTERECTOMY   SHOULDER SURGERY     RIGHT SHOULDER SURGERY   THYROIDECTOMY     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   RIGHT   TOTAL KNEE ARTHROPLASTY Left 09/10/2018   Procedure: TOTAL KNEE ARTHROPLASTY;  Surgeon: Melrose Nakayama, MD;  Location: [facility];  Service: Orthopedics;  Laterality: Left;   FAMILY HISTORY Family History   Problem Relation Age of Onset   Osteoporosis Mother    Heart disease Mother    Macular degeneration Mother    Heart disease Father    Cancer Father        BRAIN TUMOR   Cancer Brother        BRAIN TUMOR   Diabetes Maternal Aunt    Diabetes Maternal Uncle    Stroke Paternal Grandfather    Colon cancer Neg Hx    Breast cancer Neg Hx    Migraines Neg Hx    SOCIAL HISTORY Social History   Tobacco Use   Smoking status: Never   Smokeless tobacco: Never  Vaping Use   Vaping Use: Never used  Substance Use Topics   Alcohol use: No    Alcohol/week: 0.0 standard drinks of alcohol   Drug use: No       OPHTHALMIC EXAM: Base Eye Exam     Visual Acuity (Snellen - Linear)       Right Left   Dist [place] 20/40 -2 20/30 -1   Dist ph [place] 20/30 -1          Tonometry (Tonopen, 7:47 AM)       Right Left   Pressure 11 13         Pupils       Dark Light Shape React APD   Right 3 2 Round Brisk None   Left 3 2 Round Brisk None         Visual Fields (Counting fingers)       Left Right    Full Full         Extraocular Movement       Right Left    Full, Ortho Full, Ortho         Neuro/Psych     Oriented x3: Yes   Mood/Affect: Normal         Dilation     Both eyes: 1.0% Mydriacyl, 2.5% Phenylephrine @ 7:47 AM           Slit Lamp and Fundus Exam     Slit Lamp Exam       Right Left   Lids/Lashes Dermatochalasis - upper lid Dermatochalasis - upper lid   Conjunctiva/Sclera White and quiet White and quiet   Cornea 1+ Punctate epithelial erosions, fine endo pigment 1-2+ fine Punctate epithelial erosions, fine endo pigment   Anterior Chamber Deep and quiet Deep and quiet   Iris Round and dilated Round and dilated   Lens Toric PC IOL with marks at 1200 and 0600, 1+ Posterior capsular opacification nasal side approaching visual axis PC IOL in good position, 1-2+ Posterior capsular opacification  Anterior Vitreous Vitreous syneresis, trace pigment,  Posterior vitreous detachment, vitreous condensations Vitreous syneresis, Posterior vitreous detachment, vitreous condensations         Fundus Exam       Right Left   Disc mild Pallor, Sharp rim, +cupping, PPA mild Pallor, Sharp rim, +cupping, PPA   C/D Ratio 0.6 0.6   Macula Blunted foveal reflex, central CNV with subretinal heme -- stably resolved -- now with pigment ring, RPE mottling, clumping and atrophy, Drusen, shallow SRF overlying CNV -- stably resolved, cystic changes temporal mac - slightly increased Flat, Blunted foveal reflex, Drusen, RPE mottling, clumping and atrophy, No heme or edema   Vessels attenuated, Tortuous mild attenuation, mild tortuosity   Periphery Attached, reticular degeneration, paving stone degeneration IT quad, No heme Attached, reticular degeneration, mild inferior paving stone degeneration, No heme           IMAGING AND PROCEDURES  Imaging and Procedures for 05/05/2022  OCT, Retina - OU - Both Eyes       Right Eye Quality was good. Central Foveal Thickness: 352. Progression has worsened. Findings include no IRF, no SRF, abnormal foveal contour, retinal drusen , subretinal hyper-reflective material, pigment epithelial detachment, outer retinal atrophy (Stable improvement in SRF overlying PED; mild interval increase in cystic changes / IRF temporal fovea overlying central PED / [facility]).   Left Eye Quality was good. Central Foveal Thickness: 222. Progression has been stable. Findings include normal foveal contour, no IRF, no SRF, retinal drusen , outer retinal atrophy.   Notes *Images captured and stored on drive  Diagnosis / Impression:  OD: Stable improvement in SRF overlying PED; mild interval increase in cystic changes / IRF temporal fovea overlying central PED / SRHM OS: non-exu ARMD   Clinical management:  See below  Abbreviations: NFP - Normal foveal profile. CME - cystoid macular edema. PED - pigment epithelial detachment. IRF -  intraretinal fluid. SRF - subretinal fluid. EZ - ellipsoid zone. ERM - epiretinal membrane. ORA - outer retinal atrophy. ORT - outer retinal tubulation. SRHM - subretinal hyper-reflective material. IRHM - intraretinal hyper-reflective material      Intravitreal Injection, Pharmacologic Agent - OD - Right Eye       Time Out 05/05/2022. 8:32 AM. Confirmed correct patient, procedure, site, and patient consented.   Anesthesia Topical anesthesia was used. Anesthetic medications included Lidocaine 2%, Proparacaine 0.5%.   Procedure Preparation included 5% betadine to ocular surface, eyelid speculum. A (32g) needle was used.   Injection: 2 mg aflibercept 2 MG/0.05ML   Route: Intravitreal, Site: Right Eye   NDC: [id number], Lot: [id number], Expiration date: 08/10/2023, Waste: 0 mL   Post-op Post injection exam found visual acuity of at least counting fingers. The patient tolerated the procedure well. There were no complications. The patient received written and verbal post procedure care education. Post injection medications were not given.            ASSESSMENT/PLAN:   ICD-10-CM   1. Exudative age-related macular degeneration of right eye with active choroidal neovascularization (HCC)  H35.3211 OCT, Retina - OU - Both Eyes    Intravitreal Injection, Pharmacologic Agent - OD - Right Eye    aflibercept (EYLEA) SOLN 2 mg    2. Intermediate stage nonexudative age-related macular degeneration of left eye  H35.3122     3. Essential hypertension  I10     4. Hypertensive retinopathy of both eyes  H35.033     5. Pseudophakia, both eyes  Z96.1  6. PCO (posterior capsule opacification), bilateral  H26.493      1. Exudative age related macular degeneration, OD  - s/p IVA OD #1 (05.06.22), #2 (06.03.22), #3 (07.01.22), #4 (07.29.22), #5 (08.26.22), #6 (09.23.22), #7 (10.21.22), #8 (11.18.22), #9 (12.16.22), #10 (01.20.23) -- IVA resistance  - s/p IVE OD #1 (02.24.23) -- sample, #2  (03.24.23), #3 (04.21.23), #4 (05.19.23), #5 (06.16.23), #6 (07.21.23), #7 (09.01.23)  - OCT shows Stable improvement in SRF overlying PED; mild interval increase in cystic changes / IRF temporal fovea overlying central PED / SRHM at 8 weeks  - BCVA 20/30 OD -- improved  - recommend IVE OD #8 today, 10.27.23 -- w/ f/u at 7-8 wks - pt wishes to be treated with IVE - RBA of procedure discussed, questions answered - IVA informed consent obtained and signed, 05.06.22 (OD) - IVE informed consent obtained and signed, 02.24.23 (OD) - see procedure note  - f/u in 7-8 wks -- DFE/OCT, possible injection, tx and ext as able  2. Age related macular degeneration, non-exudative, OS  - The incidence, anatomy, and pathology of dry AMD, risk of progression, and the AREDS and AREDS 2 studies including smoking risks discussed with patient.    - Recommend amsler grid monitoring   3,4. Hypertensive retinopathy OU - discussed importance of tight BP control - monitor   5. Pseudophakia OU  - s/p CE/IOL  - IOL in good position, doing well  - monitor    6. PCO OU (OD > OS)  - monitor   Ophthalmic Meds Ordered this visit:  Meds ordered this encounter  Medications   aflibercept (EYLEA) SOLN 2 mg     Return for f/u 7-8 weeks, exu ARMD OD, DFE, OCT.  There are no Patient Instructions on file for this visit.  This document serves as a record of services personally performed by Gardiner Sleeper, MD, PhD. It was created on their behalf by Renaldo Reel, [place] an ophthalmic technician. The creation of this record is the provider's dictation and/or activities during the visit.    Electronically signed by:  Renaldo Reel, COT  10.23.23 8:14 PM   Gardiner Sleeper, M.D., Ph.D. Diseases & Surgery of the Retina and [place]  I have reviewed the above documentation for accuracy and completeness, and I agree with the above. Gardiner Sleeper, M.D., Ph.D. 05/05/22 8:16  PM   Abbreviations: M myopia (nearsighted); A astigmatism; H hyperopia (farsighted); P presbyopia; Mrx spectacle prescription;  CTL contact lenses; OD right eye; OS left eye; OU both eyes  XT exotropia; ET esotropia; PEK punctate epithelial keratitis; PEE punctate epithelial erosions; DES dry eye syndrome; MGD meibomian gland dysfunction; ATs artificial tears; PFAT's preservative free artificial tears; [place] nuclear sclerotic cataract; PSC posterior subcapsular cataract; ERM epi-retinal membrane; PVD posterior vitreous detachment; RD retinal detachment; DM diabetes mellitus; DR diabetic retinopathy; NPDR non-proliferative diabetic retinopathy; PDR proliferative diabetic retinopathy; CSME clinically significant macular edema; DME diabetic macular edema; dbh dot blot hemorrhages; CWS cotton wool spot; POAG primary open angle glaucoma; C/D cup-to-disc ratio; HVF humphrey visual field; GVF goldmann visual field; OCT optical coherence tomography; IOP intraocular pressure; BRVO Branch retinal vein occlusion; CRVO central retinal vein occlusion; CRAO central retinal artery occlusion; BRAO branch retinal artery occlusion; RT retinal tear; SB scleral buckle; PPV pars plana vitrectomy; VH Vitreous hemorrhage; PRP panretinal laser photocoagulation; IVK intravitreal kenalog; VMT vitreomacular traction; MH Macular hole;  NVD neovascularization of the disc; NVE neovascularization elsewhere; AREDS age related eye  disease study; ARMD age related macular degeneration; POAG primary open angle glaucoma; EBMD epithelial/anterior basement membrane dystrophy; ACIOL anterior chamber intraocular lens; IOL intraocular lens; PCIOL posterior chamber intraocular lens; Phaco/IOL phacoemulsification with intraocular lens placement; [place] photorefractive keratectomy; LASIK laser assisted in situ keratomileusis; HTN hypertension; DM diabetes mellitus; COPD chronic obstructive pulmonary disease

## 2022-05-05 ENCOUNTER — Ambulatory Visit (INDEPENDENT_AMBULATORY_CARE_PROVIDER_SITE_OTHER): Payer: Medicare PPO | Admitting: Ophthalmology

## 2022-05-05 ENCOUNTER — Encounter (INDEPENDENT_AMBULATORY_CARE_PROVIDER_SITE_OTHER): Payer: Self-pay | Admitting: Ophthalmology

## 2022-05-05 DIAGNOSIS — H35033 Hypertensive retinopathy, bilateral: Secondary | ICD-10-CM | POA: Diagnosis not present

## 2022-05-05 DIAGNOSIS — H353122 Nonexudative age-related macular degeneration, left eye, intermediate dry stage: Secondary | ICD-10-CM

## 2022-05-05 DIAGNOSIS — I10 Essential (primary) hypertension: Secondary | ICD-10-CM | POA: Diagnosis not present

## 2022-05-05 DIAGNOSIS — Z961 Presence of intraocular lens: Secondary | ICD-10-CM | POA: Diagnosis not present

## 2022-05-05 DIAGNOSIS — H26493 Other secondary cataract, bilateral: Secondary | ICD-10-CM

## 2022-05-05 DIAGNOSIS — H353211 Exudative age-related macular degeneration, right eye, with active choroidal neovascularization: Secondary | ICD-10-CM | POA: Diagnosis not present

## 2022-05-05 MED ORDER — AFLIBERCEPT 2MG/0.05ML IZ SOLN FOR KALEIDOSCOPE
2.0000 mg | INTRAVITREAL | Status: AC | PRN
  Administered 2022-05-05: 2 mg via INTRAVITREAL

## 2022-05-15 ENCOUNTER — Ambulatory Visit: Payer: Medicare PPO | Admitting: Family Medicine

## 2022-06-19 DIAGNOSIS — Z1231 Encounter for screening mammogram for malignant neoplasm of breast: Secondary | ICD-10-CM | POA: Diagnosis not present

## 2022-06-19 LAB — HM MAMMOGRAPHY

## 2022-06-20 NOTE — Progress Notes (Signed)
[facility] Note  06/23/2022     CHIEF COMPLAINT Patient presents for Retina Follow Up  HISTORY OF PRESENT ILLNESS: Dana Sandoval is a 81 y.o. 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  Severity is moderate.  Duration of 7 weeks.  Since onset it is stable.  I, the attending physician,  performed the HPI with the patient and updated documentation appropriately.        Comments   Pt here for 7 wk ret f/u for exu ARMD OD. Pt states no VA changes.       Last edited by Bernarda Caffey, MD on 06/24/2022  2:00 AM.    Pt states she cannot tell if her vision is any different  Referring physician: Tonia Ghent, MD [place],  [place] [address]  HISTORICAL INFORMATION:  Selected notes from the MEDICAL RECORD NUMBER Referred by Dr. Zenia Resides for eval of ret heme OD   CURRENT MEDICATIONS: No current outpatient medications on file. (Ophthalmic Drugs)   No current facility-administered medications for this visit. (Ophthalmic Drugs)   Current Outpatient Medications (Other)  Medication Sig   Accu-Chek Softclix Lancets lancets Use to check blood sugar daily. Dx E11.9   apixaban (ELIQUIS) 5 MG TABS tablet Take 1 tablet (5 mg total) by mouth 2 (two) times daily.   atorvastatin (LIPITOR) 40 MG tablet TAKE 1 TABLET BY MOUTH ONCE A DAY AT 6PM   Blood Glucose Monitoring Suppl (ACCU-CHEK GUIDE) w/Device KIT Use to check blood sugar daily. Dx E11.9   glucose blood (ACCU-CHEK GUIDE) test strip Use to check blood sugar daily. Dx E11.9   levothyroxine (SYNTHROID) 88 MCG tablet Take 1 tablet (88 mcg total) by mouth daily.   metFORMIN (GLUCOPHAGE) 500 MG tablet Take 1 tablet (500 mg total) by mouth 2 (two) times daily with a meal.   Multiple Vitamins-Minerals (PRESERVISION AREDS 2 PO) Take 1 tablet by mouth 2 (two) times daily.    ramipril (ALTACE) 10 MG capsule TAKE 1  CAPSULE BY MOUTH ONCE DAILY   valACYclovir (VALTREX) 500 MG tablet Take 500 mg by mouth daily as needed (fever blister).   vitamin B-12 (CYANOCOBALAMIN) 1000 MCG tablet Take 1 tablet (1,000 mcg total) by mouth daily.   VITAMIN D PO Take 1,000 Units by mouth daily.   No current facility-administered medications for this visit. (Other)   REVIEW OF SYSTEMS: ROS   Positive for: Endocrine, Cardiovascular, Eyes Negative for: Constitutional, Gastrointestinal, Neurological, Skin, Genitourinary, Musculoskeletal, HENT, Respiratory, Psychiatric, Allergic/Imm, Heme/Lymph Last edited by Kingsley Spittle, COT on 06/23/2022  7:58 AM.     ALLERGIES Allergies  Allergen Reactions   Codeine Itching    After 3 days   Dilaudid [Hydromorphone Hcl] Itching    After 3 days   PAST MEDICAL HISTORY Past Medical History:  Diagnosis Date   Aortic stenosis    Arthritis    B12 deficiency    Diabetes mellitus    type 2   Heart murmur    aortic stenosis   Hyperlipidemia    Hypothyroidism    IBS (irritable bowel syndrome)    Macular degeneration    Macular degeneration of right eye    NSVD (normal spontaneous vaginal delivery)    X 4   Pain    INTRASCAPULAR PAIN   SAB (spontaneous abortion)  X 2   Thyroid disease    HYPOTHYROIDISM   Past Surgical History:  Procedure Laterality Date   ABDOMINAL HYSTERECTOMY  1977   TAH  (FIBROIDS)   CATARACT EXTRACTION Bilateral    DILATION AND CURETTAGE OF UTERUS     DOPPLER ECHOCARDIOGRAPHY  2009   HAD AN ECHOCARDIOGRAM THAT SHOWED MODERATE MITRAL  ANNULAR CALCIFICATION AND A NORMAL EJECTION FRACTION OF 55%   EYE SURGERY Bilateral    Cat Sx   OTHER SURGICAL HISTORY     HYSTERECTOMY   SHOULDER SURGERY     RIGHT SHOULDER SURGERY   THYROIDECTOMY     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   RIGHT   TOTAL KNEE ARTHROPLASTY Left 09/10/2018   Procedure: TOTAL KNEE ARTHROPLASTY;  Surgeon: Melrose Nakayama, MD;  Location: [facility];  Service: Orthopedics;   Laterality: Left;   FAMILY HISTORY Family History  Problem Relation Age of Onset   Osteoporosis Mother    Heart disease Mother    Macular degeneration Mother    Heart disease Father    Cancer Father        BRAIN TUMOR   Cancer Brother        BRAIN TUMOR   Diabetes Maternal Aunt    Diabetes Maternal Uncle    Stroke Paternal Grandfather    Colon cancer Neg Hx    Breast cancer Neg Hx    Migraines Neg Hx    SOCIAL HISTORY Social History   Tobacco Use   Smoking status: Never   Smokeless tobacco: Never  Vaping Use   Vaping Use: Never used  Substance Use Topics   Alcohol use: No    Alcohol/week: 0.0 standard drinks of alcohol   Drug use: No       OPHTHALMIC EXAM: Base Eye Exam     Visual Acuity (Snellen - Linear)       Right Left   Dist [place] 20/50 +1 20/30 -2   Dist ph [place] 20/40 +1          Tonometry (Tonopen, 8:09 AM)       Right Left   Pressure 10 16         Pupils       Pupils Dark Light Shape React APD   Right PERRL 3 2 Round Brisk None   Left PERRL 3 2 Round Brisk None         Visual Fields (Counting fingers)       Left Right    Full Full         Extraocular Movement       Right Left    Full, Ortho Full, Ortho         Neuro/Psych     Oriented x3: Yes   Mood/Affect: Normal         Dilation     Both eyes: 1.0% Mydriacyl, 2.5% Phenylephrine @ 8:11 AM           Slit Lamp and Fundus Exam     Slit Lamp Exam       Right Left   Lids/Lashes Dermatochalasis - upper lid Dermatochalasis - upper lid   Conjunctiva/Sclera White and quiet White and quiet   Cornea 1+ Punctate epithelial erosions, fine endo pigment, trace tear film debris 1-2+ fine Punctate epithelial erosions, fine endo pigment, trace tear film debris   Anterior Chamber Deep and quiet Deep and quiet   Iris Round and dilated Round and dilated   Lens Toric PC IOL with marks at  1200 and 0600, 1+ Posterior capsular opacification nasal side approaching visual axis PC IOL  in good position, 1-2+ Posterior capsular opacification   Anterior Vitreous Vitreous syneresis, trace pigment, Posterior vitreous detachment, vitreous condensations Vitreous syneresis, Posterior vitreous detachment, vitreous condensations         Fundus Exam       Right Left   Disc mild Pallor, Sharp rim, +cupping, PPA mild Pallor, Sharp rim, +cupping, PPA   C/D Ratio 0.7 0.7   Macula Blunted foveal reflex, central CNV with subretinal heme -- stably resolved -- now with pigment ring, RPE mottling, clumping and atrophy, Drusen, shallow SRF overlying CNV -- stably resolved, cystic changes temporal mac - slightly improved Flat, Blunted foveal reflex, Drusen, RPE mottling, clumping and atrophy, No heme or edema, scattered patched of GA   Vessels attenuated, mild tortuosity, mild AV crossing changes mild attenuation, mild tortuosity   Periphery Attached, reticular degeneration, paving stone degeneration IT quad, No heme Attached, reticular degeneration, mild inferior paving stone degeneration, No heme           Refraction     Manifest Refraction       Sphere Cylinder Axis Dist VA   Right -0.50 +0.50 010 20/40   Left               IMAGING AND PROCEDURES  Imaging and Procedures for 06/23/2022  OCT, Retina - OU - Both Eyes       Right Eye Quality was good. Central Foveal Thickness: 334. Progression has improved. Findings include no IRF, no SRF, abnormal foveal contour, retinal drusen , subretinal hyper-reflective material, pigment epithelial detachment, outer retinal atrophy (Stable improvement in SRF overlying PED; mild interval improvement in cystic changes / IRF temporal fovea overlying central PED / [facility]).   Left Eye Quality was good. Central Foveal Thickness: 224. Progression has been stable. Findings include normal foveal contour, no IRF, no SRF, retinal drusen , outer retinal atrophy.   Notes *Images captured and stored on drive  Diagnosis / Impression:  OD: Stable  improvement in SRF overlying PED; mild interval improvement in cystic changes / IRF temporal fovea overlying central PED / SRHM OS: non-exu ARMD   Clinical management:  See below  Abbreviations: NFP - Normal foveal profile. CME - cystoid macular edema. PED - pigment epithelial detachment. IRF - intraretinal fluid. SRF - subretinal fluid. EZ - ellipsoid zone. ERM - epiretinal membrane. ORA - outer retinal atrophy. ORT - outer retinal tubulation. SRHM - subretinal hyper-reflective material. IRHM - intraretinal hyper-reflective material      Intravitreal Injection, Pharmacologic Agent - OD - Right Eye       Time Out 06/23/2022. 8:01 AM. Confirmed correct patient, procedure, site, and patient consented.   Anesthesia Topical anesthesia was used. Anesthetic medications included Lidocaine 2%, Proparacaine 0.5%.   Procedure Preparation included 5% betadine to ocular surface, eyelid speculum. A (32g) needle was used.   Injection: 2 mg aflibercept 2 MG/0.05ML   Route: Intravitreal, Site: Right Eye   NDC: [id number], Lot: [id number], Expiration date: 09/07/2023, Waste: 0 mL   Post-op Post injection exam found visual acuity of at least counting fingers. The patient tolerated the procedure well. There were no complications. The patient received written and verbal post procedure care education. Post injection medications were not given.            ASSESSMENT/PLAN:   ICD-10-CM   1. Exudative age-related macular degeneration of right eye with active choroidal neovascularization ([place])  H35.3211 OCT, Retina -  OU - Both Eyes    Intravitreal Injection, Pharmacologic Agent - OD - Right Eye    aflibercept (EYLEA) SOLN 2 mg    2. Intermediate stage nonexudative age-related macular degeneration of left eye  H35.3122     3. Essential hypertension  I10     4. Hypertensive retinopathy of both eyes  H35.033     5. Pseudophakia, both eyes  Z96.1     6. PCO (posterior capsule opacification),  bilateral  H26.493      1. Exudative age related macular degeneration, OD  - s/p IVA OD #1 (05.06.22), #2 (06.03.22), #3 (07.01.22), #4 (07.29.22), #5 (08.26.22), #6 (09.23.22), #7 (10.21.22), #8 (11.18.22), #9 (12.16.22), #10 (01.20.23) -- IVA resistance  - s/p IVE OD #1 (02.24.23) -- sample, #2 (03.24.23), #3 (04.21.23), #4 (05.19.23), #5 (06.16.23), #6 (07.21.23), #7 (09.01.23), #8 (10.27.23)  - OCT shows Stable improvement in SRF overlying PED; mild interval improvement in cystic changes / IRF temporal fovea overlying central PED / SRHM at 7 weeks  - BCVA 20/40 OD -- decreased  - recommend IVE OD #9 today, 12.15.23 -- w/ f/u at 7 wks - pt wishes to be treated with IVE - RBA of procedure discussed, questions answered - IVA informed consent obtained and signed, 05.06.22 (OD) - IVE informed consent obtained and signed, 02.24.23 (OD) - see procedure note  - f/u in 7 wks -- DFE/OCT, possible injection, tx and ext as able  2. Age related macular degeneration, non-exudative, OS  - The incidence, anatomy, and pathology of dry AMD, risk of progression, and the AREDS and AREDS 2 studies including smoking risks discussed with patient.    - Recommend amsler grid monitoring   3,4. Hypertensive retinopathy OU - discussed importance of tight BP control - monitor   5. Pseudophakia OU  - s/p CE/IOL  - IOL in good position, doing well  - monitor    6. PCO OU (OD > OS)  - monitor   Ophthalmic Meds Ordered this visit:  Meds ordered this encounter  Medications   aflibercept (EYLEA) SOLN 2 mg     Return in about 7 weeks (around 08/11/2022) for f/u exu ARMD OD, DFE, OCT.  There are no Patient Instructions on file for this visit.  This document serves as a record of services personally performed by Gardiner Sleeper, MD, PhD. It was created on their behalf by San Jetty. Owens Shark, OA an ophthalmic technician. The creation of this record is the provider's dictation and/or activities during the visit.     Electronically signed by: San Jetty. Owens Shark, [place] 12.12.2023 2:00 AM  Gardiner Sleeper, M.D., Ph.D. Diseases & Surgery of the Retina and Vitreous [facility]  I have reviewed the above documentation for accuracy and completeness, and I agree with the above. Gardiner Sleeper, M.D., Ph.D. 06/24/22 2:02 AM   Abbreviations: M myopia (nearsighted); A astigmatism; H hyperopia (farsighted); P presbyopia; Mrx spectacle prescription;  CTL contact lenses; OD right eye; OS left eye; OU both eyes  XT exotropia; ET esotropia; PEK punctate epithelial keratitis; PEE punctate epithelial erosions; DES dry eye syndrome; MGD meibomian gland dysfunction; ATs artificial tears; PFAT's preservative free artificial tears; [place] nuclear sclerotic cataract; PSC posterior subcapsular cataract; ERM epi-retinal membrane; PVD posterior vitreous detachment; RD retinal detachment; DM diabetes mellitus; DR diabetic retinopathy; NPDR non-proliferative diabetic retinopathy; PDR proliferative diabetic retinopathy; CSME clinically significant macular edema; DME diabetic macular edema; dbh dot blot hemorrhages; CWS cotton wool spot; POAG primary open  angle glaucoma; C/D cup-to-disc ratio; HVF humphrey visual field; GVF goldmann visual field; OCT optical coherence tomography; IOP intraocular pressure; BRVO Branch retinal vein occlusion; CRVO central retinal vein occlusion; CRAO central retinal artery occlusion; BRAO branch retinal artery occlusion; RT retinal tear; SB scleral buckle; PPV pars plana vitrectomy; VH Vitreous hemorrhage; PRP panretinal laser photocoagulation; IVK intravitreal kenalog; VMT vitreomacular traction; MH Macular hole;  NVD neovascularization of the disc; NVE neovascularization elsewhere; AREDS age related eye disease study; ARMD age related macular degeneration; POAG primary open angle glaucoma; EBMD epithelial/anterior basement membrane dystrophy; ACIOL anterior chamber intraocular lens; IOL  intraocular lens; PCIOL posterior chamber intraocular lens; Phaco/IOL phacoemulsification with intraocular lens placement; [place] photorefractive keratectomy; LASIK laser assisted in situ keratomileusis; HTN hypertension; DM diabetes mellitus; COPD chronic obstructive pulmonary disease

## 2022-06-23 ENCOUNTER — Encounter (INDEPENDENT_AMBULATORY_CARE_PROVIDER_SITE_OTHER): Payer: Self-pay | Admitting: Ophthalmology

## 2022-06-23 ENCOUNTER — Ambulatory Visit (INDEPENDENT_AMBULATORY_CARE_PROVIDER_SITE_OTHER): Payer: Medicare PPO | Admitting: Ophthalmology

## 2022-06-23 DIAGNOSIS — H26493 Other secondary cataract, bilateral: Secondary | ICD-10-CM

## 2022-06-23 DIAGNOSIS — H35033 Hypertensive retinopathy, bilateral: Secondary | ICD-10-CM

## 2022-06-23 DIAGNOSIS — H353211 Exudative age-related macular degeneration, right eye, with active choroidal neovascularization: Secondary | ICD-10-CM | POA: Diagnosis not present

## 2022-06-23 DIAGNOSIS — I10 Essential (primary) hypertension: Secondary | ICD-10-CM | POA: Diagnosis not present

## 2022-06-23 DIAGNOSIS — Z961 Presence of intraocular lens: Secondary | ICD-10-CM | POA: Diagnosis not present

## 2022-06-23 DIAGNOSIS — H353122 Nonexudative age-related macular degeneration, left eye, intermediate dry stage: Secondary | ICD-10-CM

## 2022-06-23 MED ORDER — AFLIBERCEPT 2MG/0.05ML IZ SOLN FOR KALEIDOSCOPE
2.0000 mg | INTRAVITREAL | Status: AC | PRN
  Administered 2022-06-23: 2 mg via INTRAVITREAL

## 2022-07-05 ENCOUNTER — Encounter: Payer: Self-pay | Admitting: Internal Medicine

## 2022-07-05 ENCOUNTER — Ambulatory Visit: Payer: Medicare PPO | Admitting: Internal Medicine

## 2022-07-05 VITALS — BP 138/76 | HR 55 | Temp 97.1°F | Ht 62.0 in

## 2022-07-05 DIAGNOSIS — R051 Acute cough: Secondary | ICD-10-CM

## 2022-07-05 DIAGNOSIS — S76219A Strain of adductor muscle, fascia and tendon of unspecified thigh, initial encounter: Secondary | ICD-10-CM | POA: Diagnosis not present

## 2022-07-05 DIAGNOSIS — R059 Cough, unspecified: Secondary | ICD-10-CM | POA: Insufficient documentation

## 2022-07-05 MED ORDER — BENZONATATE 200 MG PO CAPS: 200.0000 mg | ORAL_CAPSULE | Freq: Three times a day (TID) | ORAL | 0 refills | Status: DC | PRN

## 2022-07-05 MED ORDER — HYDROCODONE-ACETAMINOPHEN 5-325 MG PO TABS: 1.0000 | ORAL_TABLET | Freq: Four times a day (QID) | ORAL | 0 refills | Status: DC | PRN

## 2022-07-05 NOTE — Progress Notes (Signed)
Subjective:    Patient ID: Dana Sandoval, female    DOB: 1940-11-16, 81 y.o.   MRN: [id number]  HPI Here with daughter due to ongoing cough  Started at least 2 weeks ago---runny nose 2 days later--the cough started and has continued Test for COVID--negative Felt it was a cold Doesn't feel sick now---did have decreased appetite and lethargy a while No headache or congestion Rare mucus from left nare No sore throat, headache or ear pain No fever  Ongoing cough---then 2 days ago, noticed pain in right groin Needed to use walker to walk since then Yesterday, the pain went down legs towards knees--but now just in groin Mostly notes with cough Points to both adductor muscles as the pain spot Tylenol didn't help  Current Outpatient Medications on File Prior to Visit  Medication Sig Dispense Refill   Accu-Chek Softclix Lancets lancets Use to check blood sugar daily. Dx E11.9 100 each 12   apixaban (ELIQUIS) 5 MG TABS tablet Take 1 tablet (5 mg total) by mouth 2 (two) times daily. 60 tablet 3   atorvastatin (LIPITOR) 40 MG tablet TAKE 1 TABLET BY MOUTH ONCE A DAY AT 6PM 90 tablet 3   Blood Glucose Monitoring Suppl (ACCU-CHEK GUIDE) w/Device KIT Use to check blood sugar daily. Dx E11.9 1 kit 0   glucose blood (ACCU-CHEK GUIDE) test strip Use to check blood sugar daily. Dx E11.9 100 each 12   levothyroxine (SYNTHROID) 88 MCG tablet Take 1 tablet (88 mcg total) by mouth daily. 90 tablet 3   metFORMIN (GLUCOPHAGE) 500 MG tablet Take 1 tablet (500 mg total) by mouth 2 (two) times daily with a meal. 180 tablet 3   Multiple Vitamins-Minerals (PRESERVISION AREDS 2 PO) Take 1 tablet by mouth 2 (two) times daily.      ramipril (ALTACE) 10 MG capsule TAKE 1 CAPSULE BY MOUTH ONCE DAILY 90 capsule 3   valACYclovir (VALTREX) 500 MG tablet Take 500 mg by mouth daily as needed (fever blister).     vitamin B-12 (CYANOCOBALAMIN) 1000 MCG tablet Take 1 tablet (1,000 mcg total) by mouth daily.      VITAMIN D PO Take 1,000 Units by mouth daily.     No current facility-administered medications on file prior to visit.    Allergies  Allergen Reactions   Codeine Itching    After 3 days   Dilaudid [Hydromorphone Hcl] Itching    After 3 days    Past Medical History:  Diagnosis Date   Aortic stenosis    Arthritis    B12 deficiency    Diabetes mellitus    type 2   Heart murmur    aortic stenosis   Hyperlipidemia    Hypothyroidism    IBS (irritable bowel syndrome)    Macular degeneration    Macular degeneration of right eye    NSVD (normal spontaneous vaginal delivery)    X 4   Pain    INTRASCAPULAR PAIN   SAB (spontaneous abortion)    X 2   Thyroid disease    HYPOTHYROIDISM    Past Surgical History:  Procedure Laterality Date   ABDOMINAL HYSTERECTOMY  1977   TAH  (FIBROIDS)   CATARACT EXTRACTION Bilateral    DILATION AND CURETTAGE OF UTERUS     DOPPLER ECHOCARDIOGRAPHY  2009   HAD AN ECHOCARDIOGRAM THAT SHOWED MODERATE MITRAL  ANNULAR CALCIFICATION AND A NORMAL EJECTION FRACTION OF 55%   EYE SURGERY Bilateral    Cat Sx   OTHER SURGICAL  HISTORY     HYSTERECTOMY   SHOULDER SURGERY     RIGHT SHOULDER SURGERY   THYROIDECTOMY     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   RIGHT   TOTAL KNEE ARTHROPLASTY Left 09/10/2018   Procedure: TOTAL KNEE ARTHROPLASTY;  Surgeon: Melrose Nakayama, MD;  Location: [facility];  Service: Orthopedics;  Laterality: Left;    Family History  Problem Relation Age of Onset   Osteoporosis Mother    Heart disease Mother    Macular degeneration Mother    Heart disease Father    Cancer Father        BRAIN TUMOR   Cancer Brother        BRAIN TUMOR   Diabetes Maternal Aunt    Diabetes Maternal Uncle    Stroke Paternal Grandfather    Colon cancer Neg Hx    Breast cancer Neg Hx    Migraines Neg Hx     Social History   Socioeconomic History   Marital status: Married    Spouse name: Not on file   Number of children: 4   Years of  education: Not on file   Highest education level: Not on file  Occupational History   Not on file  Tobacco Use   Smoking status: Never   Smokeless tobacco: Never  Vaping Use   Vaping Use: Never used  Substance and Sexual Activity   Alcohol use: No    Alcohol/week: 0.0 standard drinks of alcohol   Drug use: No   Sexual activity: Yes    Partners: Male    Birth control/protection: Surgical    Comment: 1st intercourse- 17, partners- 73, married- 67 yrs   Other Topics Concern   Not on file  Social History Narrative   Lives with husband, Pearson Forster- daughter, documents at home   Social Determinants of Health   Financial Resource Strain: Low Risk  (08/30/2021)   Overall Financial Resource Strain (CARDIA)    Difficulty of Paying Living Expenses: Not hard at all  Food Insecurity: No Food Insecurity (08/30/2021)   Hunger Vital Sign    Worried About Running Out of Food in the Last Year: Never true    [place] in the Last Year: Never true  Transportation Needs: No Transportation Needs (08/30/2021)   PRAPARE - Hydrologist (Medical): No    Lack of Transportation (Non-Medical): No  Physical Activity: Inactive (08/30/2021)   Exercise Vital Sign    Days of Exercise per Week: 0 days    Minutes of Exercise per Session: 0 min  Stress: No Stress Concern Present (08/30/2021)   [place]    Feeling of Stress : Not at all  Social Connections: Moderately Integrated (08/30/2021)   Social Connection and Isolation Panel [NHANES]    Frequency of Communication with Friends and Family: More than three times a week    Frequency of Social Gatherings with Friends and Family: More than three times a week    Attends Religious Services: 1 to 4 times per year    Active Member of [employer] or Organizations: No    Attends Archivist Meetings: Never    Marital Status: Married  Human resources officer  Violence: Not At Risk (08/30/2021)   Humiliation, Afraid, Rape, and Kick questionnaire    Fear of Current or Ex-Partner: No    Emotionally Abused: No    Physically Abused: No    Sexually Abused: No  Review of Systems No change in smell or taste Appetite is improved now Still gets loose stool once a week or so---nothing new    Objective:   Physical Exam Constitutional:      Appearance: Normal appearance.  HENT:     Mouth/Throat:     Pharynx: No oropharyngeal exudate or posterior oropharyngeal erythema.  Pulmonary:     Effort: Pulmonary effort is normal.     Breath sounds: Normal breath sounds. No wheezing or rales.  Abdominal:     Palpations: Abdomen is soft.     Tenderness: There is no abdominal tenderness.     Hernia: No hernia is present.  Musculoskeletal:     Cervical back: Neck supple.     Comments: No inguinal/femoral hernia Tenderness is along adductor's  Lymphadenopathy:     Cervical: No cervical adenopathy.  Neurological:     Mental Status: She is alert.            Assessment & Plan:

## 2022-07-05 NOTE — Assessment & Plan Note (Signed)
Seems to be post infectious Not really ill now or secondary infection Will give benzonatate for tid use to suppress cough

## 2022-07-05 NOTE — Assessment & Plan Note (Addendum)
Will try to suppress cough Okay for tylenol, topical diclofenac and heat Pain is severe --so will give a few hydrocodone

## 2022-07-06 ENCOUNTER — Ambulatory Visit: Payer: Medicare PPO | Admitting: Family Medicine

## 2022-07-09 ENCOUNTER — Other Ambulatory Visit: Payer: Self-pay | Admitting: Family Medicine

## 2022-07-09 DIAGNOSIS — E119 Type 2 diabetes mellitus without complications: Secondary | ICD-10-CM

## 2022-07-09 DIAGNOSIS — I10 Essential (primary) hypertension: Secondary | ICD-10-CM

## 2022-07-09 DIAGNOSIS — M8580 Other specified disorders of bone density and structure, unspecified site: Secondary | ICD-10-CM

## 2022-07-09 DIAGNOSIS — E538 Deficiency of other specified B group vitamins: Secondary | ICD-10-CM

## 2022-07-12 NOTE — Progress Notes (Signed)
[facility] Note  07/13/2022     CHIEF COMPLAINT Patient presents for Retina Follow Up  HISTORY OF PRESENT ILLNESS: Dana Sandoval is a 82 y.o. 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  Severity is moderate.  Duration of 3 weeks.  Since onset it is gradually worsening.  I, the attending physician,  performed the HPI with the patient and updated documentation appropriately.        Comments   Pt here for 3 wk ret f/u for dark spot in OS appearing last Fri/Sat. Pt states the dark spot in OS is 'above whatever she's focusing on'. She notices while watching TV that letters in words dip down a little, sometimes being more so than others. OD seems stable.       Last edited by Bernarda Caffey, MD on 07/13/2022  9:51 PM.    Pt states last Friday or Saturday she noticed new distortion in her left eye, she states she was looking at a door and could see the door handle, but not the top of the door, she is seeing a black spot in her vision just above center  Referring physician: Tonia Ghent, MD [place],  [place] [id number]  HISTORICAL INFORMATION:  Selected notes from the [facility] Referred by Dr. Zenia Resides for eval of ret heme OD   CURRENT MEDICATIONS: No current outpatient medications on file. (Ophthalmic Drugs)   No current facility-administered medications for this visit. (Ophthalmic Drugs)   Current Outpatient Medications (Other)  Medication Sig   Accu-Chek Softclix Lancets lancets Use to check blood sugar daily. Dx E11.9   apixaban (ELIQUIS) 5 MG TABS tablet Take 1 tablet (5 mg total) by mouth 2 (two) times daily.   atorvastatin (LIPITOR) 40 MG tablet TAKE 1 TABLET BY MOUTH ONCE A DAY AT 6PM   benzonatate (TESSALON) 200 MG capsule Take 1 capsule (200 mg total) by mouth 3 (three) times daily as needed for cough.   Blood Glucose Monitoring  Suppl (ACCU-CHEK GUIDE) w/Device KIT Use to check blood sugar daily. Dx E11.9   glucose blood (ACCU-CHEK GUIDE) test strip Use to check blood sugar daily. Dx E11.9   HYDROcodone-acetaminophen (NORCO/VICODIN) 5-325 MG tablet Take 1 tablet by mouth every 6 (six) hours as needed for moderate pain.   levothyroxine (SYNTHROID) 88 MCG tablet Take 1 tablet (88 mcg total) by mouth daily.   metFORMIN (GLUCOPHAGE) 500 MG tablet Take 1 tablet (500 mg total) by mouth 2 (two) times daily with a meal.   Multiple Vitamins-Minerals (PRESERVISION AREDS 2 PO) Take 1 tablet by mouth 2 (two) times daily.    ramipril (ALTACE) 10 MG capsule TAKE 1 CAPSULE BY MOUTH ONCE DAILY   valACYclovir (VALTREX) 500 MG tablet Take 500 mg by mouth daily as needed (fever blister).   vitamin B-12 (CYANOCOBALAMIN) 1000 MCG tablet Take 1 tablet (1,000 mcg total) by mouth daily.   VITAMIN D PO Take 1,000 Units by mouth daily.   No current facility-administered medications for this visit. (Other)   REVIEW OF SYSTEMS: ROS   Positive for: Endocrine, Cardiovascular, Eyes Negative for: Constitutional, Gastrointestinal, Neurological, Skin, Genitourinary, Musculoskeletal, HENT, Respiratory, Psychiatric, Allergic/Imm, Heme/Lymph Last edited by Kingsley Spittle, COT on 07/13/2022  8:18 AM.     ALLERGIES Allergies  Allergen Reactions   Codeine Itching  After 3 days   Dilaudid [Hydromorphone Hcl] Itching    After 3 days   PAST MEDICAL HISTORY Past Medical History:  Diagnosis Date   Aortic stenosis    Arthritis    B12 deficiency    Diabetes mellitus    type 2   Heart murmur    aortic stenosis   Hyperlipidemia    Hypothyroidism    IBS (irritable bowel syndrome)    Macular degeneration    Macular degeneration of right eye    NSVD (normal spontaneous vaginal delivery)    X 4   Pain    INTRASCAPULAR PAIN   SAB (spontaneous abortion)    X 2   Thyroid disease    HYPOTHYROIDISM   Past Surgical History:  Procedure  Laterality Date   ABDOMINAL HYSTERECTOMY  1977   TAH  (FIBROIDS)   CATARACT EXTRACTION Bilateral    DILATION AND CURETTAGE OF UTERUS     DOPPLER ECHOCARDIOGRAPHY  2009   HAD AN ECHOCARDIOGRAM THAT SHOWED MODERATE MITRAL  ANNULAR CALCIFICATION AND A NORMAL EJECTION FRACTION OF 55%   EYE SURGERY Bilateral    Cat Sx   OTHER SURGICAL HISTORY     HYSTERECTOMY   SHOULDER SURGERY     RIGHT SHOULDER SURGERY   THYROIDECTOMY     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   RIGHT   TOTAL KNEE ARTHROPLASTY Left 09/10/2018   Procedure: TOTAL KNEE ARTHROPLASTY;  Surgeon: Melrose Nakayama, MD;  Location: [facility];  Service: Orthopedics;  Laterality: Left;   FAMILY HISTORY Family History  Problem Relation Age of Onset   Osteoporosis Mother    Heart disease Mother    Macular degeneration Mother    Heart disease Father    Cancer Father        BRAIN TUMOR   Cancer Brother        BRAIN TUMOR   Diabetes Maternal Aunt    Diabetes Maternal Uncle    Stroke Paternal Grandfather    Colon cancer Neg Hx    Breast cancer Neg Hx    Migraines Neg Hx    SOCIAL HISTORY Social History   Tobacco Use   Smoking status: Never   Smokeless tobacco: Never  Vaping Use   Vaping Use: Never used  Substance Use Topics   Alcohol use: No    Alcohol/week: 0.0 standard drinks of alcohol   Drug use: No       OPHTHALMIC EXAM: Base Eye Exam     Visual Acuity (Snellen - Linear)       Right Left   Dist [place] 20/50 +2 20/40 +1   Dist ph [place] 20/40 NI         Tonometry (Tonopen, 8:27 AM)       Right Left   Pressure 12 14         Pupils       Pupils Dark Light Shape React APD   Right PERRL 3 2 Round Brisk None   Left PERRL 3 2 Round Brisk None         Visual Fields (Counting fingers)       Left Right    Full Full         Extraocular Movement       Right Left    Full, Ortho Full, Ortho         Neuro/Psych     Oriented x3: Yes   Mood/Affect: Normal         Dilation  Both eyes: 1.0%  Mydriacyl, 2.5% Phenylephrine @ 8:28 AM           Slit Lamp and Fundus Exam     Slit Lamp Exam       Right Left   Lids/Lashes Dermatochalasis - upper lid Dermatochalasis - upper lid   Conjunctiva/Sclera White and quiet White and quiet   Cornea 1+ Punctate epithelial erosions, fine endo pigment, trace tear film debris 1+ fine Punctate epithelial erosions, fine endo pigment, trace tear film debris   Anterior Chamber Deep and quiet Deep and quiet   Iris Round and dilated Round and dilated   Lens Toric PC IOL with marks at 1200 and 0600, 1+ Posterior capsular opacification nasal side approaching visual axis Toric PC IOL with marks at 1200 and 0600, 1+ Posterior capsular opacification   Anterior Vitreous Vitreous syneresis, trace pigment, Posterior vitreous detachment, vitreous condensations Vitreous syneresis, Posterior vitreous detachment, vitreous condensations         Fundus Exam       Right Left   Disc mild Pallor, Sharp rim, +cupping, PPA mild Pallor, Sharp rim, +cupping, PPA, +SVP   C/D Ratio 0.7 0.7   Macula Blunted foveal reflex, central CNV with subretinal heme -- stably resolved -- now with pigment ring, RPE mottling, clumping and atrophy, Drusen, shallow SRF overlying CNV -- stably resolved, cystic changes temporal mac - slightly improved Flat, Blunted foveal reflex, Drusen, RPE mottling, clumping and atrophy, focal IRH/SRH inferior to fovea, +edema -- new CNV, scattered patched of GA   Vessels attenuated, mild tortuosity, mild AV crossing changes mild attenuation, mild tortuosity   Periphery Attached, reticular degeneration, paving stone degeneration IT quad, No heme Attached, reticular degeneration, mild inferior paving stone degeneration, No heme           Refraction     Manifest Refraction       Sphere Cylinder Axis Dist VA   Right       Left -0.25 +0.25 180 20/40           IMAGING AND PROCEDURES  Imaging and Procedures for 07/13/2022  OCT, Retina - OU -  Both Eyes       Right Eye Quality was good. Central Foveal Thickness: 320. Progression has improved. Findings include no IRF, no SRF, abnormal foveal contour, retinal drusen , subretinal hyper-reflective material, pigment epithelial detachment, outer retinal atrophy (Stable improvement in SRF overlying PED; mild interval improvement in cystic changes / IRF temporal fovea overlying central PED / [facility]).   Left Eye Quality was good. Central Foveal Thickness: 243. Progression has worsened. Findings include normal foveal contour, no IRF, no SRF, retinal drusen , outer retinal atrophy (Interval development of SRHM and edema inferior fovea and macula).   Notes *Images captured and stored on drive  Diagnosis / Impression:  OD: Stable improvement in SRF overlying PED; mild interval improvement in cystic changes / IRF temporal fovea overlying central PED / [facility] OS: Interval development of SRHM and edema inferior fovea and macula  Clinical management:  See below  Abbreviations: NFP - Normal foveal profile. CME - cystoid macular edema. PED - pigment epithelial detachment. IRF - intraretinal fluid. SRF - subretinal fluid. EZ - ellipsoid zone. ERM - epiretinal membrane. ORA - outer retinal atrophy. ORT - outer retinal tubulation. SRHM - subretinal hyper-reflective material. IRHM - intraretinal hyper-reflective material      Intravitreal Injection, Pharmacologic Agent - OS - Left Eye       Time Out 07/13/2022. 8:58 AM. Confirmed correct  patient, procedure, site, and patient consented.   Anesthesia Topical anesthesia was used. Anesthetic medications included Lidocaine 2%, Proparacaine 0.5%.   Procedure Preparation included 5% betadine to ocular surface, eyelid speculum. A supplied needle was used.   Injection: 1.25 mg Bevacizumab 1.53m/0.05ml   Route: Intravitreal, Site: Left Eye   NDC:: 37628-315-17 Lot: [id number] , Expiration date: 07/26/2022   Post-op Post injection exam found visual  acuity of at least counting fingers. The patient tolerated the procedure well. There were no complications. The patient received written and verbal post procedure care education.            ASSESSMENT/PLAN:   ICD-10-CM   1. Exudative age-related macular degeneration of right eye with active choroidal neovascularization (HCC)  H35.3211 OCT, Retina - OU - Both Eyes    CANCELED: Intravitreal Injection, Pharmacologic Agent - OD - Right Eye    2. Exudative age-related macular degeneration of left eye with active choroidal neovascularization (HCC)  H35.3221 OCT, Retina - OU - Both Eyes    Intravitreal Injection, Pharmacologic Agent - OS - Left Eye    Bevacizumab (AVASTIN) SOLN 1.25 mg    3. Essential hypertension  I10     4. Hypertensive retinopathy of both eyes  H35.033     5. Pseudophakia, both eyes  Z96.1     6. PCO (posterior capsule opacification), bilateral  H26.493      1. Exudative age related macular degeneration, OD  - s/p IVA OD #1 (05.06.22), #2 (06.03.22), #3 (07.01.22), #4 (07.29.22), #5 (08.26.22), #6 (09.23.22), #7 (10.21.22), #8 (11.18.22), #9 (12.16.22), #10 (01.20.23) -- IVA resistance  - s/p IVE OD #1 (02.24.23) -- sample, #2 (03.24.23), #3 (04.21.23), #4 (05.19.23), #5 (06.16.23), #6 (07.21.23), #7 (09.01.23), #8 (10.27.23), #9 (12.15.23)  - OCT shows Stable improvement in SRF overlying PED; mild interval improvement in cystic changes / IRF temporal fovea overlying central PED / SRHM  - BCVA 20/40 OD -- stable - IVA informed consent obtained and signed, 05.06.22 (OD) - IVE informed consent obtained and signed, 02.24.23 (OD)  - f/u February 2 as scheduled -- DFE/OCT, possible injection, tx and ext as able  2. Exudative age related macular degeneration, left eye   - early conversion to exudative ARMD  - pt reports new superior paracentral scotoma and +distortion OS  - BCVA OS 20/40 from 202/30  - OCT shows interval development of SRHM and edema inferior fovea and  macula  - The incidence pathology and anatomy of wet AMD discussed   - The ANCHOR, MARINA, CATT and VIEW trials discussed with patient.    - discussed treatment options including observation vs intravitreal anti-VEGF agents such as Avastin, Lucentis, Eylea.    - Risks of endophthalmitis and vascular occlusive events and atrophic changes discussed with patient - recommend IVA OS #1 today, 01.04.24  - pt wishes to be treated with IVA  - RBA of procedure discussed, questions answered - informed consent obtained and signed - see procedure note  - f/u in 4 wks -- February 2 as scheduled, DFE, OCT, possible injxns  3,4. Hypertensive retinopathy OU - discussed importance of tight BP control - monitor   5. Pseudophakia OU  - s/p CE/IOL  - IOL in good position, doing well  - monitor    6. PCO OU (OD > OS)  - monitor   Ophthalmic Meds Ordered this visit:  Meds ordered this encounter  Medications   Bevacizumab (AVASTIN) SOLN 1.25 mg     Return for f/u 02.02.24 as scheduled,  exu ARMD OU, DFE, OCT.  There are no Patient Instructions on file for this visit.  This document serves as a record of services personally performed by Gardiner Sleeper, MD, PhD. It was created on their behalf by San Jetty. Owens Shark, OA an ophthalmic technician. The creation of this record is the provider's dictation and/or activities during the visit.    Electronically signed by: San Jetty. Owens Shark, [place] 01.03.2024 9:52 PM   Gardiner Sleeper, M.D., Ph.D. Diseases & Surgery of the Retina and Vitreous [facility]  I have reviewed the above documentation for accuracy and completeness, and I agree with the above. Gardiner Sleeper, M.D., Ph.D. 07/13/22 9:56 PM   Abbreviations: M myopia (nearsighted); A astigmatism; H hyperopia (farsighted); P presbyopia; Mrx spectacle prescription;  CTL contact lenses; OD right eye; OS left eye; OU both eyes  XT exotropia; ET esotropia; PEK punctate epithelial keratitis;  PEE punctate epithelial erosions; DES dry eye syndrome; MGD meibomian gland dysfunction; ATs artificial tears; PFAT's preservative free artificial tears; [place] nuclear sclerotic cataract; PSC posterior subcapsular cataract; ERM epi-retinal membrane; PVD posterior vitreous detachment; RD retinal detachment; DM diabetes mellitus; DR diabetic retinopathy; NPDR non-proliferative diabetic retinopathy; PDR proliferative diabetic retinopathy; CSME clinically significant macular edema; DME diabetic macular edema; dbh dot blot hemorrhages; CWS cotton wool spot; POAG primary open angle glaucoma; C/D cup-to-disc ratio; HVF humphrey visual field; GVF goldmann visual field; OCT optical coherence tomography; IOP intraocular pressure; BRVO Branch retinal vein occlusion; CRVO central retinal vein occlusion; CRAO central retinal artery occlusion; BRAO branch retinal artery occlusion; RT retinal tear; SB scleral buckle; PPV pars plana vitrectomy; VH Vitreous hemorrhage; PRP panretinal laser photocoagulation; IVK intravitreal kenalog; VMT vitreomacular traction; MH Macular hole;  NVD neovascularization of the disc; NVE neovascularization elsewhere; AREDS age related eye disease study; ARMD age related macular degeneration; POAG primary open angle glaucoma; EBMD epithelial/anterior basement membrane dystrophy; ACIOL anterior chamber intraocular lens; IOL intraocular lens; PCIOL posterior chamber intraocular lens; Phaco/IOL phacoemulsification with intraocular lens placement; [place] photorefractive keratectomy; LASIK laser assisted in situ keratomileusis; HTN hypertension; DM diabetes mellitus; COPD chronic obstructive pulmonary disease

## 2022-07-13 ENCOUNTER — Encounter (INDEPENDENT_AMBULATORY_CARE_PROVIDER_SITE_OTHER): Payer: Self-pay | Admitting: Ophthalmology

## 2022-07-13 ENCOUNTER — Ambulatory Visit (INDEPENDENT_AMBULATORY_CARE_PROVIDER_SITE_OTHER): Payer: PPO | Admitting: Ophthalmology

## 2022-07-13 DIAGNOSIS — H353122 Nonexudative age-related macular degeneration, left eye, intermediate dry stage: Secondary | ICD-10-CM

## 2022-07-13 DIAGNOSIS — H35033 Hypertensive retinopathy, bilateral: Secondary | ICD-10-CM | POA: Diagnosis not present

## 2022-07-13 DIAGNOSIS — Z961 Presence of intraocular lens: Secondary | ICD-10-CM | POA: Diagnosis not present

## 2022-07-13 DIAGNOSIS — H26493 Other secondary cataract, bilateral: Secondary | ICD-10-CM | POA: Diagnosis not present

## 2022-07-13 DIAGNOSIS — H353221 Exudative age-related macular degeneration, left eye, with active choroidal neovascularization: Secondary | ICD-10-CM

## 2022-07-13 DIAGNOSIS — H353211 Exudative age-related macular degeneration, right eye, with active choroidal neovascularization: Secondary | ICD-10-CM

## 2022-07-13 DIAGNOSIS — I10 Essential (primary) hypertension: Secondary | ICD-10-CM

## 2022-07-13 DIAGNOSIS — H353231 Exudative age-related macular degeneration, bilateral, with active choroidal neovascularization: Secondary | ICD-10-CM | POA: Diagnosis not present

## 2022-07-13 MED ORDER — BEVACIZUMAB CHEMO INJECTION 1.25MG/0.05ML SYRINGE FOR KALEIDOSCOPE
1.2500 mg | INTRAVITREAL | Status: AC | PRN
  Administered 2022-07-13: 1.25 mg via INTRAVITREAL

## 2022-07-14 ENCOUNTER — Other Ambulatory Visit: Payer: Self-pay

## 2022-07-14 ENCOUNTER — Other Ambulatory Visit: Payer: Medicare PPO

## 2022-07-14 DIAGNOSIS — E785 Hyperlipidemia, unspecified: Secondary | ICD-10-CM

## 2022-07-14 DIAGNOSIS — E119 Type 2 diabetes mellitus without complications: Secondary | ICD-10-CM

## 2022-07-14 MED ORDER — ATORVASTATIN CALCIUM 40 MG PO TABS: ORAL_TABLET | ORAL | 0 refills | Status: DC

## 2022-07-14 MED ORDER — RAMIPRIL 10 MG PO CAPS: 10.0000 mg | ORAL_CAPSULE | Freq: Every day | ORAL | 0 refills | Status: DC

## 2022-07-21 ENCOUNTER — Encounter: Payer: Medicare PPO | Admitting: Family Medicine

## 2022-08-04 ENCOUNTER — Other Ambulatory Visit: Payer: Self-pay | Admitting: Family Medicine

## 2022-08-04 DIAGNOSIS — E785 Hyperlipidemia, unspecified: Secondary | ICD-10-CM

## 2022-08-04 DIAGNOSIS — E119 Type 2 diabetes mellitus without complications: Secondary | ICD-10-CM

## 2022-08-07 ENCOUNTER — Other Ambulatory Visit: Payer: Self-pay

## 2022-08-07 DIAGNOSIS — I480 Paroxysmal atrial fibrillation: Secondary | ICD-10-CM

## 2022-08-07 DIAGNOSIS — G459 Transient cerebral ischemic attack, unspecified: Secondary | ICD-10-CM

## 2022-08-07 MED ORDER — APIXABAN 5 MG PO TABS: ORAL_TABLET | ORAL | 3 refills | Status: DC

## 2022-08-10 NOTE — Progress Notes (Signed)
[facility] - Clinic Note  08/11/2022     CHIEF COMPLAINT Patient presents for Retina Follow Up  HISTORY OF PRESENT ILLNESS: Dana Sandoval is a 82 y.o. 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  Severity is moderate.  Duration of 7 weeks.  Since onset it is stable.  I, the attending physician,  performed the HPI with the patient and updated documentation appropriately.        Comments   Pt here for 7 wk ret f/u exu ARMD OD. Pt states VA the same, no changes.       Last edited by Rennis Chris, MD on 08/12/2022 11:54 PM.     Pt states the distortion she was seeing 4 weeks ago is gone now, last Weds she had an ocular migraine  Referring physician: Joaquim Nam, MD [address] [place],  [place] [address]  HISTORICAL INFORMATION:  Selected notes from the MEDICAL RECORD NUMBER Referred by Dr. Laruth Bouchard for eval of ret heme OD   CURRENT MEDICATIONS: No current outpatient medications on file. (Ophthalmic Drugs)   No current facility-administered medications for this visit. (Ophthalmic Drugs)   Current Outpatient Medications (Other)  Medication Sig   Accu-Chek Softclix Lancets lancets Use to check blood sugar daily. Dx E11.9   apixaban (ELIQUIS) 5 MG TABS tablet TAKE 1 TABLET BY MOUTH TWICE (2) DAILY   atorvastatin (LIPITOR) 40 MG tablet TAKE 1 TABLET BY MOUTH ONCE A DAY AT 6PM   benzonatate (TESSALON) 200 MG capsule Take 1 capsule (200 mg total) by mouth 3 (three) times daily as needed for cough.   Blood Glucose Monitoring Suppl (ACCU-CHEK GUIDE) w/Device KIT Use to check blood sugar daily. Dx E11.9   glucose blood (ACCU-CHEK GUIDE) test strip Use to check blood sugar daily. Dx E11.9   HYDROcodone-acetaminophen (NORCO/VICODIN) 5-325 MG tablet Take 1 tablet by mouth every 6 (six) hours as needed for moderate pain.   levothyroxine (SYNTHROID) 88 MCG tablet Take 1  tablet (88 mcg total) by mouth daily.   metFORMIN (GLUCOPHAGE) 500 MG tablet Take 1 tablet (500 mg total) by mouth 2 (two) times daily with a meal.   Multiple Vitamins-Minerals (PRESERVISION AREDS 2 PO) Take 1 tablet by mouth 2 (two) times daily.    ramipril (ALTACE) 10 MG capsule Take 1 capsule (10 mg total) by mouth daily.   valACYclovir (VALTREX) 500 MG tablet Take 500 mg by mouth daily as needed (fever blister).   vitamin B-12 (CYANOCOBALAMIN) 1000 MCG tablet Take 1 tablet (1,000 mcg total) by mouth daily.   VITAMIN D PO Take 1,000 Units by mouth daily.   No current facility-administered medications for this visit. (Other)   REVIEW OF SYSTEMS: ROS   Positive for: Endocrine, Cardiovascular, Eyes Negative for: Constitutional, Gastrointestinal, Neurological, Skin, Genitourinary, Musculoskeletal, HENT, Respiratory, Psychiatric, Allergic/Imm, Heme/Lymph Last edited by Thompson Grayer, COT on 08/11/2022  7:59 AM.     ALLERGIES Allergies  Allergen Reactions   Codeine Itching    After 3 days   Dilaudid [Hydromorphone Hcl] Itching    After 3 days   PAST MEDICAL HISTORY Past Medical History:  Diagnosis Date   Aortic stenosis    Arthritis    B12 deficiency    Diabetes mellitus    type 2   Heart murmur    aortic stenosis  Hyperlipidemia    Hypothyroidism    IBS (irritable bowel syndrome)    Macular degeneration    Macular degeneration of right eye    NSVD (normal spontaneous vaginal delivery)    X 4   Pain    INTRASCAPULAR PAIN   SAB (spontaneous abortion)    X 2   Thyroid disease    HYPOTHYROIDISM   Past Surgical History:  Procedure Laterality Date   ABDOMINAL HYSTERECTOMY  1977   TAH  (FIBROIDS)   CATARACT EXTRACTION Bilateral    DILATION AND CURETTAGE OF UTERUS     DOPPLER ECHOCARDIOGRAPHY  2009   HAD AN ECHOCARDIOGRAM THAT SHOWED MODERATE MITRAL  ANNULAR CALCIFICATION AND A NORMAL EJECTION FRACTION OF 55%   EYE SURGERY Bilateral    Cat Sx   OTHER SURGICAL  HISTORY     HYSTERECTOMY   SHOULDER SURGERY     RIGHT SHOULDER SURGERY   THYROIDECTOMY     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   RIGHT   TOTAL KNEE ARTHROPLASTY Left 09/10/2018   Procedure: TOTAL KNEE ARTHROPLASTY;  Surgeon: Melrose Nakayama, MD;  Location: [facility];  Service: Orthopedics;  Laterality: Left;   FAMILY HISTORY Family History  Problem Relation Age of Onset   Osteoporosis Mother    Heart disease Mother    Macular degeneration Mother    Heart disease Father    Cancer Father        BRAIN TUMOR   Cancer Brother        BRAIN TUMOR   Diabetes Maternal Aunt    Diabetes Maternal Uncle    Stroke Paternal Grandfather    Colon cancer Neg Hx    Breast cancer Neg Hx    Migraines Neg Hx    SOCIAL HISTORY Social History   Tobacco Use   Smoking status: Never   Smokeless tobacco: Never  Vaping Use   Vaping Use: Never used  Substance Use Topics   Alcohol use: No    Alcohol/week: 0.0 standard drinks of alcohol   Drug use: No       OPHTHALMIC EXAM: Base Eye Exam     Visual Acuity (Snellen - Linear)       Right Left   Dist [place] 20/40 -2 20/40 +1   Dist ph [place] 20/30 -1 NI         Tonometry (Tonopen, 8:05 AM)       Right Left   Pressure 11 9         Pupils       Pupils Dark Light Shape React APD   Right PERRL 3 2 Round Brisk None   Left PERRL 3 2 Round Brisk None         Visual Fields (Counting fingers)       Left Right    Full Full         Extraocular Movement       Right Left    Full, Ortho Full, Ortho         Neuro/Psych     Oriented x3: Yes   Mood/Affect: Normal         Dilation     Both eyes: 1.0% Mydriacyl, 2.5% Phenylephrine @ 8:06 AM           Slit Lamp and Fundus Exam     Slit Lamp Exam       Right Left   Lids/Lashes Dermatochalasis - upper lid Dermatochalasis - upper lid   Conjunctiva/Sclera White and quiet White and quiet  Cornea 1+ Punctate epithelial erosions, fine endo pigment, trace tear film debris 1+ fine  Punctate epithelial erosions, fine endo pigment, trace tear film debris   Anterior Chamber Deep and quiet Deep and quiet   Iris Round and dilated Round and dilated   Lens Toric PC IOL with marks at 1200 and 0600, 1+ Posterior capsular opacification nasal side approaching visual axis Toric PC IOL with marks at 1200 and 0600, 1+ Posterior capsular opacification   Anterior Vitreous Vitreous syneresis, trace pigment, Posterior vitreous detachment, vitreous condensations Vitreous syneresis, Posterior vitreous detachment, vitreous condensations         Fundus Exam       Right Left   Disc mild Pallor, Sharp rim, +cupping, PPA mild Pallor, Sharp rim, +cupping, PPA, +SVP   C/D Ratio 0.7 0.7   Macula Blunted foveal reflex, central CNV with subretinal heme -- stably resolved -- now with pigment ring, RPE mottling, clumping and atrophy, Drusen, stable improvement in shallow SRF overlying CNV, cystic changes temporal mac Flat, Blunted foveal reflex, Drusen, RPE mottling, clumping and atrophy, focal IRH/SRH inferior to fovea -- improved, +edema -- improved, scattered patched of GA   Vessels attenuated, mild tortuosity, mild AV crossing changes mild attenuation, mild tortuosity   Periphery Attached, reticular degeneration, paving stone degeneration IT quad, No heme Attached, reticular degeneration, mild inferior paving stone degeneration, No heme           IMAGING AND PROCEDURES  Imaging and Procedures for 08/11/2022  OCT, Retina - OU - Both Eyes       Right Eye Quality was good. Central Foveal Thickness: 323. Progression has been stable. Findings include no IRF, no SRF, abnormal foveal contour, retinal drusen , subretinal hyper-reflective material, pigment epithelial detachment, outer retinal atrophy (Stable improvement in SRF overlying PED; trace persistent cystic changes / IRF temporal fovea overlying central PED / [facility]).   Left Eye Quality was good. Central Foveal Thickness: 224. Progression has  improved. Findings include normal foveal contour, no IRF, no SRF, retinal drusen , outer retinal atrophy (Interval improvement in [facility] and edema inferior fovea and macula).   Notes *Images captured and stored on drive  Diagnosis / Impression:  OD: Stable improvement in SRF overlying PED; trace persistent cystic changes / IRF temporal fovea overlying central PED / SRHM OS: Interval improvement in [facility] and edema inferior fovea and macula  Clinical management:  See below  Abbreviations: NFP - Normal foveal profile. CME - cystoid macular edema. PED - pigment epithelial detachment. IRF - intraretinal fluid. SRF - subretinal fluid. EZ - ellipsoid zone. ERM - epiretinal membrane. ORA - outer retinal atrophy. ORT - outer retinal tubulation. SRHM - subretinal hyper-reflective material. IRHM - intraretinal hyper-reflective material      Intravitreal Injection, Pharmacologic Agent - OD - Right Eye       Time Out 08/11/2022. 8:39 AM. Confirmed correct patient, procedure, site, and patient consented.   Anesthesia Topical anesthesia was used. Anesthetic medications included Lidocaine 2%, Proparacaine 0.5%.   Procedure Preparation included 5% betadine to ocular surface, eyelid speculum. A (32g) needle was used.   Injection: 2 mg aflibercept 2 MG/0.05ML   Route: Intravitreal, Site: Right Eye   NDC: A3590391, Lot: [id number], Expiration date: 10/07/2023, Waste: 0 mL   Post-op Post injection exam found visual acuity of at least counting fingers. The patient tolerated the procedure well. There were no complications. The patient received written and verbal post procedure care education. Post injection medications were not given.  Intravitreal Injection, Pharmacologic Agent - OS - Left Eye       Time Out 08/11/2022. 8:39 AM. Confirmed correct patient, procedure, site, and patient consented.   Anesthesia Topical anesthesia was used. Anesthetic medications included Lidocaine 2%,  Proparacaine 0.5%.   Procedure Preparation included 5% betadine to ocular surface, eyelid speculum. A supplied needle was used.   Injection: 1.25 mg Bevacizumab 1.25mg /0.61ml   Route: Intravitreal, Site: Left Eye   NDC: P3213405, Lot: [id number], Expiration date: 09/26/2022   Post-op Post injection exam found visual acuity of at least counting fingers. The patient tolerated the procedure well. There were no complications. The patient received written and verbal post procedure care education.            ASSESSMENT/PLAN:   ICD-10-CM   1. Exudative age-related macular degeneration of right eye with active choroidal neovascularization (HCC)  H35.3211 OCT, Retina - OU - Both Eyes    Intravitreal Injection, Pharmacologic Agent - OD - Right Eye    aflibercept (EYLEA) SOLN 2 mg    2. Exudative age-related macular degeneration of left eye with active choroidal neovascularization (HCC)  H35.3221 Intravitreal Injection, Pharmacologic Agent - OS - Left Eye    Bevacizumab (AVASTIN) SOLN 1.25 mg    3. Essential hypertension  I10     4. Hypertensive retinopathy of both eyes  H35.033     5. Pseudophakia, both eyes  Z96.1     6. PCO (posterior capsule opacification), bilateral  H26.493     7. Intermediate stage nonexudative age-related macular degeneration of left eye  H35.3122       1. Exudative age related macular degeneration, OD  - s/p IVA OD #1 (05.06.22), #2 (06.03.22), #3 (07.01.22), #4 (07.29.22), #5 (08.26.22), #6 (09.23.22), #7 (10.21.22), #8 (11.18.22), #9 (12.16.22), #10 (01.20.23) -- IVA resistance  - s/p IVE OD #1 (02.24.23) -- sample, #2 (03.24.23), #3 (04.21.23), #4 (05.19.23), #5 (06.16.23), #6 (07.21.23), #7 (09.01.23), #8 (10.27.23), #9 (12.15.23)  - OCT shows Stable improvement in SRF overlying PED; trace persistent cystic changes / IRF temporal fovea overlying central PED / SRHM at 7 weeks  - BCVA 20/40 OD -- stable  - recommend IVE OD #10 today, 02.02.24 with  extension to 8 weeks  - pt wishes to proceed with injection  - RBA of procedure discussed, questions answered - see procedure note - IVA informed consent obtained and signed, 05.06.22 (OD) - IVE informed consent obtained and signed, 02.24.23 (OD)  - f/u 8 wks (March 29) -- DFE/OCT, possible injection, tx and ext as able  2. Exudative age related macular degeneration, left eye   - early conversion to exudative ARMD -- noted on 01.04.24  - s/p IVA OS #1 (01.04.24)  - pt reports new superior paracentral scotoma and +distortion OS -- improved  - BCVA OS 20/40 stable  - OCT shows interval improvement in [facility] and edema inferior fovea and macula - recommend IVA OS #2 today, 02.02.24 - pt wishes to be treated with IVA  - RBA of procedure discussed, questions answered - informed consent obtained and signed - see procedure note  - f/u in 4 wks, DFE, OCT, possible injxns  3,4. Hypertensive retinopathy OU - discussed importance of tight BP control -  monitor  5. Pseudophakia OU  - s/p CE/IOL  - IOL in good position, doing well  - monitor   6. PCO OU (OD > OS)  - monitor   Ophthalmic Meds Ordered this visit:  Meds ordered this encounter  Medications  aflibercept (EYLEA) SOLN 2 mg   Bevacizumab (AVASTIN) SOLN 1.25 mg     Return in about 4 weeks (around 09/08/2022) for f/u exu ARMD OU, DFE, OCT, possible injxn OS.  There are no Patient Instructions on file for this visit.  This document serves as a record of services personally performed by Gardiner Sleeper, MD, PhD. It was created on their behalf by Joetta Manners COT, an ophthalmic technician. The creation of this record is the provider's dictation and/or activities during the visit.    Electronically signed by: Joetta Manners COT 08/10/2022 11:54 PM  This document serves as a record of services personally performed by Gardiner Sleeper, MD, PhD. It was created on their behalf by San Jetty. Owens Shark, OA an ophthalmic technician.  The creation of this record is the provider's dictation and/or activities during the visit.    Electronically signed by: San Jetty. Marguerita Merles 02.02.2024 11:54 PM  Gardiner Sleeper, M.D., Ph.D. Diseases & Surgery of the Retina and [place] 08/11/2022    I have reviewed the above documentation for accuracy and completeness, and I agree with the above. Gardiner Sleeper, M.D., Ph.D. 08/12/22 11:58 PM  Abbreviations: M myopia (nearsighted); A astigmatism; H hyperopia (farsighted); P presbyopia; Mrx spectacle prescription;  CTL contact lenses; OD right eye; OS left eye; OU both eyes  XT exotropia; ET esotropia; PEK punctate epithelial keratitis; PEE punctate epithelial erosions; DES dry eye syndrome; MGD meibomian gland dysfunction; ATs artificial tears; PFAT's preservative free artificial tears; [place] nuclear sclerotic cataract; PSC posterior subcapsular cataract; ERM epi-retinal membrane; PVD posterior vitreous detachment; RD retinal detachment; DM diabetes mellitus; DR diabetic retinopathy; NPDR non-proliferative diabetic retinopathy; PDR proliferative diabetic retinopathy; CSME clinically significant macular edema; DME diabetic macular edema; dbh dot blot hemorrhages; CWS cotton wool spot; POAG primary open angle glaucoma; C/D cup-to-disc ratio; HVF humphrey visual field; GVF goldmann visual field; OCT optical coherence tomography; IOP intraocular pressure; BRVO Branch retinal vein occlusion; CRVO central retinal vein occlusion; CRAO central retinal artery occlusion; BRAO branch retinal artery occlusion; RT retinal tear; SB scleral buckle; PPV pars plana vitrectomy; VH Vitreous hemorrhage; PRP panretinal laser photocoagulation; IVK intravitreal kenalog; VMT vitreomacular traction; MH Macular hole;  NVD neovascularization of the disc; NVE neovascularization elsewhere; AREDS age related eye disease study; ARMD age related macular degeneration; POAG primary open angle glaucoma;  EBMD epithelial/anterior basement membrane dystrophy; ACIOL anterior chamber intraocular lens; IOL intraocular lens; PCIOL posterior chamber intraocular lens; Phaco/IOL phacoemulsification with intraocular lens placement; [place] photorefractive keratectomy; LASIK laser assisted in situ keratomileusis; HTN hypertension; DM diabetes mellitus; COPD chronic obstructive pulmonary disease

## 2022-08-11 ENCOUNTER — Ambulatory Visit (INDEPENDENT_AMBULATORY_CARE_PROVIDER_SITE_OTHER): Payer: HMO | Admitting: Ophthalmology

## 2022-08-11 ENCOUNTER — Encounter (INDEPENDENT_AMBULATORY_CARE_PROVIDER_SITE_OTHER): Payer: Self-pay | Admitting: Ophthalmology

## 2022-08-11 DIAGNOSIS — Z961 Presence of intraocular lens: Secondary | ICD-10-CM

## 2022-08-11 DIAGNOSIS — H35033 Hypertensive retinopathy, bilateral: Secondary | ICD-10-CM

## 2022-08-11 DIAGNOSIS — H353211 Exudative age-related macular degeneration, right eye, with active choroidal neovascularization: Secondary | ICD-10-CM

## 2022-08-11 DIAGNOSIS — I10 Essential (primary) hypertension: Secondary | ICD-10-CM | POA: Diagnosis not present

## 2022-08-11 DIAGNOSIS — H353221 Exudative age-related macular degeneration, left eye, with active choroidal neovascularization: Secondary | ICD-10-CM

## 2022-08-11 DIAGNOSIS — H353231 Exudative age-related macular degeneration, bilateral, with active choroidal neovascularization: Secondary | ICD-10-CM

## 2022-08-11 DIAGNOSIS — H26493 Other secondary cataract, bilateral: Secondary | ICD-10-CM

## 2022-08-11 DIAGNOSIS — H353122 Nonexudative age-related macular degeneration, left eye, intermediate dry stage: Secondary | ICD-10-CM

## 2022-08-11 MED ORDER — BEVACIZUMAB CHEMO INJECTION 1.25MG/0.05ML SYRINGE FOR KALEIDOSCOPE
1.2500 mg | INTRAVITREAL | Status: AC | PRN
  Administered 2022-08-11: 1.25 mg via INTRAVITREAL

## 2022-08-11 MED ORDER — AFLIBERCEPT 2MG/0.05ML IZ SOLN FOR KALEIDOSCOPE
2.0000 mg | INTRAVITREAL | Status: AC | PRN
  Administered 2022-08-11: 2 mg via INTRAVITREAL

## 2022-09-07 ENCOUNTER — Other Ambulatory Visit: Payer: Self-pay | Admitting: Family Medicine

## 2022-09-07 DIAGNOSIS — E119 Type 2 diabetes mellitus without complications: Secondary | ICD-10-CM

## 2022-09-07 NOTE — Progress Notes (Signed)
[facility] - Clinic Note  09/08/2022     CHIEF COMPLAINT Patient presents for Retina Follow Up  HISTORY OF PRESENT ILLNESS: Dana Sandoval is a 82 y.o. 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  This started 4 weeks ago.  I, the attending physician,  performed the HPI with the patient and updated documentation appropriately.        Comments   Patient here for 4 weeks retina follow up for exu ARMD OD. Patient states vision about usual. No eye pain. Last 2 days has had a tiny black dot floating around. This am only seen once and it was like a circle with light in middle like gets after injection.      Last edited by Rennis Chris, MD on 09/10/2022 10:09 PM.    Pt states she has had a few ocular migraines since she was here last, she has seen some new floaters, this morning she saw a "bubble" in her vision that didn't last very long  Referring physician: Joaquim Nam, MD [address] [place],  [place] [address]  HISTORICAL INFORMATION:  Selected notes from the MEDICAL RECORD NUMBER Referred by Dr. Laruth Bouchard for eval of ret heme OD   CURRENT MEDICATIONS: No current outpatient medications on file. (Ophthalmic Drugs)   No current facility-administered medications for this visit. (Ophthalmic Drugs)   Current Outpatient Medications (Other)  Medication Sig   Accu-Chek Softclix Lancets lancets Use to check blood sugar daily. Dx E11.9   apixaban (ELIQUIS) 5 MG TABS tablet TAKE 1 TABLET BY MOUTH TWICE (2) DAILY   atorvastatin (LIPITOR) 40 MG tablet TAKE 1 TABLET BY MOUTH ONCE A DAY AT 6PM   benzonatate (TESSALON) 200 MG capsule Take 1 capsule (200 mg total) by mouth 3 (three) times daily as needed for cough.   Blood Glucose Monitoring Suppl (ACCU-CHEK GUIDE) w/Device KIT Use to check blood sugar daily. Dx E11.9   glucose blood (ACCU-CHEK GUIDE) test strip Use to check blood  sugar daily. Dx E11.9   HYDROcodone-acetaminophen (NORCO/VICODIN) 5-325 MG tablet Take 1 tablet by mouth every 6 (six) hours as needed for moderate pain.   Multiple Vitamins-Minerals (PRESERVISION AREDS 2 PO) Take 1 tablet by mouth 2 (two) times daily.    ramipril (ALTACE) 10 MG capsule Take 1 capsule (10 mg total) by mouth daily.   valACYclovir (VALTREX) 500 MG tablet Take 500 mg by mouth daily as needed (fever blister).   vitamin B-12 (CYANOCOBALAMIN) 1000 MCG tablet Take 1 tablet (1,000 mcg total) by mouth daily.   VITAMIN D PO Take 1,000 Units by mouth daily.   levothyroxine (SYNTHROID) 88 MCG tablet TAKE 1 TABLET BY MOUTH EVERY DAY   metFORMIN (GLUCOPHAGE) 500 MG tablet TAKE 1 TABLET BY MOUTH TWICE A DAY   No current facility-administered medications for this visit. (Other)   REVIEW OF SYSTEMS: ROS   Positive for: Endocrine, Cardiovascular, Eyes Negative for: Constitutional, Gastrointestinal, Neurological, Skin, Genitourinary, Musculoskeletal, HENT, Respiratory, Psychiatric, Allergic/Imm, Heme/Lymph Last edited by Laddie Aquas, COA on 09/08/2022  8:21 AM.      ALLERGIES Allergies  Allergen Reactions   Codeine Itching    After 3 days   Dilaudid [Hydromorphone Hcl] Itching    After 3 days   PAST MEDICAL HISTORY Past Medical History:  Diagnosis Date   Aortic stenosis  Arthritis    B12 deficiency    Diabetes mellitus    type 2   Heart murmur    aortic stenosis   Hyperlipidemia    Hypothyroidism    IBS (irritable bowel syndrome)    Macular degeneration    Macular degeneration of right eye    NSVD (normal spontaneous vaginal delivery)    X 4   Pain    INTRASCAPULAR PAIN   SAB (spontaneous abortion)    X 2   Thyroid disease    HYPOTHYROIDISM   Past Surgical History:  Procedure Laterality Date   ABDOMINAL HYSTERECTOMY  1977   TAH  (FIBROIDS)   CATARACT EXTRACTION Bilateral    DILATION AND CURETTAGE OF UTERUS     DOPPLER ECHOCARDIOGRAPHY  2009   HAD AN  ECHOCARDIOGRAM THAT SHOWED MODERATE MITRAL  ANNULAR CALCIFICATION AND A NORMAL EJECTION FRACTION OF 55%   EYE SURGERY Bilateral    Cat Sx   OTHER SURGICAL HISTORY     HYSTERECTOMY   SHOULDER SURGERY     RIGHT SHOULDER SURGERY   THYROIDECTOMY     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   RIGHT   TOTAL KNEE ARTHROPLASTY Left 09/10/2018   Procedure: TOTAL KNEE ARTHROPLASTY;  Surgeon: Marcene Corning, MD;  Location: [facility];  Service: Orthopedics;  Laterality: Left;   FAMILY HISTORY Family History  Problem Relation Age of Onset   Osteoporosis Mother    Heart disease Mother    Macular degeneration Mother    Heart disease Father    Cancer Father        BRAIN TUMOR   Cancer Brother        BRAIN TUMOR   Diabetes Maternal Aunt    Diabetes Maternal Uncle    Stroke Paternal Grandfather    Colon cancer Neg Hx    Breast cancer Neg Hx    Migraines Neg Hx    SOCIAL HISTORY Social History   Tobacco Use   Smoking status: Never   Smokeless tobacco: Never  Vaping Use   Vaping Use: Never used  Substance Use Topics   Alcohol use: No    Alcohol/week: 0.0 standard drinks of alcohol   Drug use: No       OPHTHALMIC EXAM: Base Eye Exam     Visual Acuity (Snellen - Linear)       Right Left   Dist [place] 20/40 -2 20/30 -2   Dist ph [place] 20/40 +1          Tonometry (Tonopen, 8:17 AM)       Right Left   Pressure 15 15         Pupils       Dark Light Shape React APD   Right 3 2 Round Brisk None   Left 3 2 Round Brisk None         Visual Fields (Counting fingers)       Left Right    Full Full         Extraocular Movement       Right Left    Full, Ortho Full, Ortho         Neuro/Psych     Oriented x3: Yes   Mood/Affect: Normal         Dilation     Both eyes: 1.0% Mydriacyl, 2.5% Phenylephrine @ 8:17 AM           Slit Lamp and Fundus Exam     Slit Lamp Exam  Right Left   Lids/Lashes Dermatochalasis - upper lid Dermatochalasis - upper lid    Conjunctiva/Sclera White and quiet White and quiet   Cornea 1+ Punctate epithelial erosions, fine endo pigment, trace tear film debris 1+ fine Punctate epithelial erosions, fine endo pigment, trace tear film debris   Anterior Chamber Deep and quiet Deep and quiet   Iris Round and dilated Round and dilated   Lens Toric PC IOL with marks at 1200 and 0600, 1+ Posterior capsular opacification nasal side approaching visual axis Toric PC IOL with marks at 1200 and 0600, 1+ Posterior capsular opacification   Anterior Vitreous Vitreous syneresis, trace pigment, Posterior vitreous detachment, vitreous condensations Vitreous syneresis, Posterior vitreous detachment, vitreous condensations         Fundus Exam       Right Left   Disc mild Pallor, Sharp rim, +cupping, PPA mild Pallor, Sharp rim, +cupping, PPA, +SVP   C/D Ratio 0.7 0.7   Macula Blunted foveal reflex, central CNV with subretinal heme -- stably resolved -- now with pigment ring, RPE mottling, clumping and atrophy, Drusen, stable improvement in shallow SRF overlying CNV, cystic changes temporal mac -- improved Flat, Blunted foveal reflex, Drusen, RPE mottling, clumping and atrophy, focal IRH/SRH inferior to fovea and edema -- stably improved, scattered patches of GA   Vessels attenuated, mild tortuosity, mild AV crossing changes mild attenuation, mild tortuosity   Periphery Attached, reticular degeneration, paving stone degeneration IT quad, No heme Attached, reticular degeneration, mild inferior paving stone degeneration, No heme           IMAGING AND PROCEDURES  Imaging and Procedures for 09/08/2022  OCT, Retina - OU - Both Eyes       Right Eye Quality was good. Central Foveal Thickness: 317. Progression has been stable. Findings include no IRF, no SRF, abnormal foveal contour, retinal drusen , subretinal hyper-reflective material, pigment epithelial detachment, outer retinal atrophy (Stable improvement in SRF overlying PED; trace  persistent cystic changes / IRF temporal fovea overlying central PED / [facility] -- improved).   Left Eye Quality was good. Central Foveal Thickness: 225. Progression has been stable. Findings include normal foveal contour, no IRF, no SRF, retinal drusen , outer retinal atrophy (stable improvement in [facility] and edema inferior fovea and macula).   Notes *Images captured and stored on drive  Diagnosis / Impression:  Ex ARMD OU OD: Stable improvement in SRF overlying PED; trace persistent cystic changes / IRF temporal fovea overlying central PED / [facility] -- improved OS: stable improvement in [facility] and edema inferior fovea and macula  Clinical management:  See below  Abbreviations: NFP - Normal foveal profile. CME - cystoid macular edema. PED - pigment epithelial detachment. IRF - intraretinal fluid. SRF - subretinal fluid. EZ - ellipsoid zone. ERM - epiretinal membrane. ORA - outer retinal atrophy. ORT - outer retinal tubulation. SRHM - subretinal hyper-reflective material. IRHM - intraretinal hyper-reflective material      Intravitreal Injection, Pharmacologic Agent - OS - Left Eye       Time Out 09/08/2022. 9:19 AM. Confirmed correct patient, procedure, site, and patient consented.   Anesthesia Topical anesthesia was used. Anesthetic medications included Lidocaine 2%, Proparacaine 0.5%.   Procedure Preparation included 5% betadine to ocular surface, eyelid speculum. A (32g) needle was used.   Injection: 1.25 mg Bevacizumab 1.25mg /0.80ml   Route: Intravitreal, Site: Left Eye   NDC: [id number], Lot: [id number], Expiration date: 11/18/2022   Post-op Post injection exam found visual acuity of at least counting fingers.  The patient tolerated the procedure well. There were no complications. The patient received written and verbal post procedure care education.            ASSESSMENT/PLAN:   ICD-10-CM   1. Exudative age-related macular degeneration of right eye with active choroidal  neovascularization (HCC)  H35.3211 OCT, Retina - OU - Both Eyes    2. Exudative age-related macular degeneration of left eye with active choroidal neovascularization (HCC)  H35.3221 Intravitreal Injection, Pharmacologic Agent - OS - Left Eye    Bevacizumab (AVASTIN) SOLN 1.25 mg    3. Essential hypertension  I10     4. Hypertensive retinopathy of both eyes  H35.033     5. Pseudophakia, both eyes  Z96.1     6. PCO (posterior capsule opacification), bilateral  H26.493      1. Exudative age related macular degeneration, OD  - s/p IVA OD #1 (05.06.22), #2 (06.03.22), #3 (07.01.22), #4 (07.29.22), #5 (08.26.22), #6 (09.23.22), #7 (10.21.22), #8 (11.18.22), #9 (12.16.22), #10 (01.20.23) -- IVA resistance  - s/p IVE OD #1 (02.24.23) -- sample, #2 (03.24.23), #3 (04.21.23), #4 (05.19.23), #5 (06.16.23), #6 (07.21.23), #7 (09.01.23), #8 (10.27.23), #9 (12.15.23) #10 (02.02.24)  - OCT shows Stable improvement in SRF overlying PED; trace persistent cystic changes / IRF temporal fovea overlying central PED / SRHM at 4 weeks  - BCVA 20/40 OD -- stable - IVA informed consent obtained and signed, 05.06.22 (OD) - IVE informed consent obtained and signed, 02.24.23 (OD)  - f/u 4 wks -- DFE/OCT, possible injection, tx and ext as able  2. Exudative age related macular degeneration, left eye   - early conversion to exudative ARMD -- noted on 01.04.24  - s/p IVA OS #1 (01.04.24), #2 (02.02.24)  - pt reports new superior paracentral scotoma and +distortion OS -- improved  - BCVA OS 20/40 stable  - OCT shows interval improvement in [facility] and edema inferior fovea and macula - recommend IVA OS #3 today, 03.01.24 - pt wishes to be treated with IVA  - RBA of procedure discussed, questions answered - informed consent obtained and signed - see procedure note  - f/u in 4 wks, DFE, OCT, possible injxns  3,4. Hypertensive retinopathy OU - discussed importance of tight BP control -  monitor  5. Pseudophakia  OU  - s/p CE/IOL  - IOL in good position, doing well  - monitor   6. PCO OU (OD > OS)  - monitor   Ophthalmic Meds Ordered this visit:  Meds ordered this encounter  Medications   Bevacizumab (AVASTIN) SOLN 1.25 mg     Return in about 4 weeks (around 10/06/2022) for f/u exu ARMD OU, DFE, OCT, possible injxn.  There are no Patient Instructions on file for this visit.  This document serves as a record of services personally performed by Karie Chimera, MD, PhD. It was created on their behalf by Berlin Hun COT, an ophthalmic technician. The creation of this record is the provider's dictation and/or activities during the visit.    Electronically signed by: Berlin Hun COT 09/07/22 10:10 PM  This document serves as a record of services personally performed by Karie Chimera, MD, PhD. It was created on their behalf by Glee Arvin. Manson Passey, OA an ophthalmic technician. The creation of this record is the provider's dictation and/or activities during the visit.    Electronically signed by: Glee Arvin. [place], [place] 03.01.2024 10:10 PM  Karie Chimera, M.D., Ph.D. Diseases & Surgery of the Retina and  Vitreous [facility] 09/08/2022   I have reviewed the above documentation for accuracy and completeness, and I agree with the above. Karie Chimera, M.D., Ph.D. 09/10/22 10:14 PM   Abbreviations: M myopia (nearsighted); A astigmatism; H hyperopia (farsighted); P presbyopia; Mrx spectacle prescription;  CTL contact lenses; OD right eye; OS left eye; OU both eyes  XT exotropia; ET esotropia; PEK punctate epithelial keratitis; PEE punctate epithelial erosions; DES dry eye syndrome; MGD meibomian gland dysfunction; ATs artificial tears; PFAT's preservative free artificial tears; NSC nuclear sclerotic cataract; PSC posterior subcapsular cataract; ERM epi-retinal membrane; PVD posterior vitreous detachment; RD retinal detachment; DM diabetes mellitus; DR diabetic  retinopathy; NPDR non-proliferative diabetic retinopathy; PDR proliferative diabetic retinopathy; CSME clinically significant macular edema; DME diabetic macular edema; dbh dot blot hemorrhages; CWS cotton wool spot; POAG primary open angle glaucoma; C/D cup-to-disc ratio; HVF humphrey visual field; GVF goldmann visual field; OCT optical coherence tomography; IOP intraocular pressure; BRVO Branch retinal vein occlusion; CRVO central retinal vein occlusion; CRAO central retinal artery occlusion; BRAO branch retinal artery occlusion; RT retinal tear; SB scleral buckle; PPV pars plana vitrectomy; VH Vitreous hemorrhage; PRP panretinal laser photocoagulation; IVK intravitreal kenalog; VMT vitreomacular traction; MH Macular hole;  NVD neovascularization of the disc; NVE neovascularization elsewhere; AREDS age related eye disease study; ARMD age related macular degeneration; POAG primary open angle glaucoma; EBMD epithelial/anterior basement membrane dystrophy; ACIOL anterior chamber intraocular lens; IOL intraocular lens; PCIOL posterior chamber intraocular lens; Phaco/IOL phacoemulsification with intraocular lens placement; PRK photorefractive keratectomy; LASIK laser assisted in situ keratomileusis; HTN hypertension; DM diabetes mellitus; COPD chronic obstructive pulmonary disease

## 2022-09-08 ENCOUNTER — Ambulatory Visit (INDEPENDENT_AMBULATORY_CARE_PROVIDER_SITE_OTHER): Payer: HMO | Admitting: Ophthalmology

## 2022-09-08 ENCOUNTER — Encounter (INDEPENDENT_AMBULATORY_CARE_PROVIDER_SITE_OTHER): Payer: Self-pay | Admitting: Ophthalmology

## 2022-09-08 DIAGNOSIS — H35033 Hypertensive retinopathy, bilateral: Secondary | ICD-10-CM | POA: Diagnosis not present

## 2022-09-08 DIAGNOSIS — I10 Essential (primary) hypertension: Secondary | ICD-10-CM

## 2022-09-08 DIAGNOSIS — Z961 Presence of intraocular lens: Secondary | ICD-10-CM | POA: Diagnosis not present

## 2022-09-08 DIAGNOSIS — H353221 Exudative age-related macular degeneration, left eye, with active choroidal neovascularization: Secondary | ICD-10-CM

## 2022-09-08 DIAGNOSIS — H353231 Exudative age-related macular degeneration, bilateral, with active choroidal neovascularization: Secondary | ICD-10-CM | POA: Diagnosis not present

## 2022-09-08 DIAGNOSIS — H26493 Other secondary cataract, bilateral: Secondary | ICD-10-CM | POA: Diagnosis not present

## 2022-09-08 DIAGNOSIS — H353211 Exudative age-related macular degeneration, right eye, with active choroidal neovascularization: Secondary | ICD-10-CM

## 2022-09-08 MED ORDER — BEVACIZUMAB CHEMO INJECTION 1.25MG/0.05ML SYRINGE FOR KALEIDOSCOPE
1.2500 mg | INTRAVITREAL | Status: AC | PRN
  Administered 2022-09-08: 1.25 mg via INTRAVITREAL

## 2022-09-11 ENCOUNTER — Other Ambulatory Visit: Payer: Self-pay | Admitting: Family Medicine

## 2022-09-11 DIAGNOSIS — E785 Hyperlipidemia, unspecified: Secondary | ICD-10-CM

## 2022-09-11 DIAGNOSIS — E119 Type 2 diabetes mellitus without complications: Secondary | ICD-10-CM

## 2022-09-22 ENCOUNTER — Other Ambulatory Visit (INDEPENDENT_AMBULATORY_CARE_PROVIDER_SITE_OTHER): Payer: HMO

## 2022-09-22 DIAGNOSIS — E119 Type 2 diabetes mellitus without complications: Secondary | ICD-10-CM

## 2022-09-22 DIAGNOSIS — E538 Deficiency of other specified B group vitamins: Secondary | ICD-10-CM | POA: Diagnosis not present

## 2022-09-22 DIAGNOSIS — I10 Essential (primary) hypertension: Secondary | ICD-10-CM

## 2022-09-22 DIAGNOSIS — M8580 Other specified disorders of bone density and structure, unspecified site: Secondary | ICD-10-CM

## 2022-09-22 LAB — CBC WITH DIFFERENTIAL/PLATELET
Basophils Absolute: 0.1 10*3/uL (ref 0.0–0.1)
Basophils Relative: 1.2 % (ref 0.0–3.0)
Eosinophils Absolute: 0.3 10*3/uL (ref 0.0–0.7)
Eosinophils Relative: 4.5 % (ref 0.0–5.0)
HCT: 33.1 % — ABNORMAL LOW (ref 36.0–46.0)
Hemoglobin: 11.2 g/dL — ABNORMAL LOW (ref 12.0–15.0)
Lymphocytes Relative: 14.2 % (ref 12.0–46.0)
Lymphs Abs: 1 10*3/uL (ref 0.7–4.0)
MCHC: 34 g/dL (ref 30.0–36.0)
MCV: 86.5 fl (ref 78.0–100.0)
Monocytes Absolute: 0.8 10*3/uL (ref 0.1–1.0)
Monocytes Relative: 11.7 % (ref 3.0–12.0)
Neutro Abs: 4.6 10*3/uL (ref 1.4–7.7)
Neutrophils Relative %: 68.4 % (ref 43.0–77.0)
Platelets: 431 10*3/uL — ABNORMAL HIGH (ref 150.0–400.0)
RBC: 3.82 Mil/uL — ABNORMAL LOW (ref 3.87–5.11)
RDW: 14.5 % (ref 11.5–15.5)
WBC: 6.8 10*3/uL (ref 4.0–10.5)

## 2022-09-22 LAB — COMPREHENSIVE METABOLIC PANEL
ALT: 7 U/L (ref 0–35)
AST: 12 U/L (ref 0–37)
Albumin: 3.5 g/dL (ref 3.5–5.2)
Alkaline Phosphatase: 52 U/L (ref 39–117)
BUN: 21 mg/dL (ref 6–23)
CO2: 27 mEq/L (ref 19–32)
Calcium: 8.7 mg/dL (ref 8.4–10.5)
Chloride: 106 mEq/L (ref 96–112)
Creatinine, Ser: 0.81 mg/dL (ref 0.40–1.20)
GFR: 67.15 mL/min (ref >60.00)
Glucose, Bld: 120 mg/dL — ABNORMAL HIGH (ref 70–99)
Potassium: 4.6 mEq/L (ref 3.5–5.1)
Sodium: 141 mEq/L (ref 135–145)
Total Bilirubin: 0.4 mg/dL (ref 0.2–1.2)
Total Protein: 5.7 g/dL — ABNORMAL LOW (ref 6.0–8.3)

## 2022-09-22 LAB — MICROALBUMIN / CREATININE URINE RATIO
Creatinine,U: 139.6 mg/dL
Microalb Creat Ratio: 0.9 mg/g (ref 0.0–30.0)
Microalb, Ur: 1.3 mg/dL (ref 0.0–1.9)

## 2022-09-22 LAB — LIPID PANEL
Cholesterol: 132 mg/dL (ref 0–200)
HDL: 35.1 mg/dL — ABNORMAL LOW (ref >39.00)
LDL Cholesterol: 81 mg/dL (ref 0–99)
NonHDL: 97.05
Total CHOL/HDL Ratio: 4
Triglycerides: 82 mg/dL (ref 0.0–149.0)
VLDL: 16.4 mg/dL (ref 0.0–40.0)

## 2022-09-22 LAB — HEMOGLOBIN A1C: Hgb A1c MFr Bld: 6.1 % (ref 4.6–6.5)

## 2022-09-22 LAB — VITAMIN B12: Vitamin B-12: 1290 pg/mL — ABNORMAL HIGH (ref 211–911)

## 2022-09-22 LAB — TSH: TSH: 0.48 u[IU]/mL (ref 0.35–5.50)

## 2022-09-22 LAB — VITAMIN D 25 HYDROXY (VIT D DEFICIENCY, FRACTURES): VITD: 37.63 ng/mL (ref 30.00–100.00)

## 2022-09-26 ENCOUNTER — Other Ambulatory Visit: Payer: Self-pay | Admitting: Family Medicine

## 2022-09-26 ENCOUNTER — Ambulatory Visit (INDEPENDENT_AMBULATORY_CARE_PROVIDER_SITE_OTHER): Payer: HMO

## 2022-09-26 VITALS — Ht 62.0 in | Wt 162.0 lb

## 2022-09-26 DIAGNOSIS — Z0000 Encounter for general adult medical examination without abnormal findings: Secondary | ICD-10-CM

## 2022-09-26 NOTE — Patient Instructions (Signed)
Dana Sandoval , Thank you for taking time to come for your Medicare Wellness Visit. I appreciate your ongoing commitment to your health goals. Please review the following plan we discussed and let me know if I can assist you in the future.   These are the goals we discussed:  Goals      Patient Stated     02/05/2020, I will maintain and continue medications as prescribed.      Patient Stated     Stay  healthy      Patient Stated     No new goals.        This is a list of the screening recommended for you and due dates:  Health Maintenance  Topic Date Due   Zoster (Shingles) Vaccine (1 of 2) Never done   COVID-19 Vaccine (4 - 2023-24 season) 03/10/2022   DTaP/Tdap/Td vaccine (2 - Td or Tdap) 07/09/2022   Complete foot exam   09/16/2022   Flu Shot  10/08/2022*   Hemoglobin A1C  03/25/2023   Mammogram  06/20/2023   Eye exam for diabetics  07/14/2023   Yearly kidney function blood test for diabetes  09/22/2023   Yearly kidney health urinalysis for diabetes  09/22/2023   Medicare Annual Wellness Visit  09/26/2023   Pneumonia Vaccine  Completed   DEXA scan (bone density measurement)  Completed   HPV Vaccine  Aged Out   Pap Smear  Discontinued  *Topic was postponed. The date shown is not the original due date.    Advanced directives: Please bring a copy of your health care power of attorney and living will to the office to be added to your chart at your convenience.   Conditions/risks identified: Aim for 30 minutes of exercise or brisk walking, 6-8 glasses of water, and 5 servings of fruits and vegetables each day.   Next appointment: Follow up in one year for your annual wellness visit 10/01/23 @ 9:45am via telephone.   Preventive Care 57 Years and Older, Female Preventive care refers to lifestyle choices and visits with your health care provider that can promote health and wellness. What does preventive care include? A yearly physical exam. This is also called an annual well  check. Dental exams once or twice a year. Routine eye exams. Ask your health care provider how often you should have your eyes checked. Personal lifestyle choices, including: Daily care of your teeth and gums. Regular physical activity. Eating a healthy diet. Avoiding tobacco and drug use. Limiting alcohol use. Practicing safe sex. Taking low-dose aspirin every day. Taking vitamin and mineral supplements as recommended by your health care provider. What happens during an annual well check? The services and screenings done by your health care provider during your annual well check will depend on your age, overall health, lifestyle risk factors, and family history of disease. Counseling  Your health care provider may ask you questions about your: Alcohol use. Tobacco use. Drug use. Emotional well-being. Home and relationship well-being. Sexual activity. Eating habits. History of falls. Memory and ability to understand (cognition). Work and work Statistician. Reproductive health. Screening  You may have the following tests or measurements: Height, weight, and BMI. Blood pressure. Lipid and cholesterol levels. These may be checked every 5 years, or more frequently if you are over 56 years old. Skin check. Lung cancer screening. You may have this screening every year starting at age 58 if you have a 30-pack-year history of smoking and currently smoke or have quit within the  past 15 years. Fecal occult blood test (FOBT) of the stool. You may have this test every year starting at age 82. Flexible sigmoidoscopy or colonoscopy. You may have a sigmoidoscopy every 5 years or a colonoscopy every 10 years starting at age 57. Hepatitis C blood test. Hepatitis B blood test. Sexually transmitted disease (STD) testing. Diabetes screening. This is done by checking your blood sugar (glucose) after you have not eaten for a while (fasting). You may have this done every 1-3 years. Bone density scan.  This is done to screen for osteoporosis. You may have this done starting at age 57. Mammogram. This may be done every 1-2 years. Talk to your health care provider about how often you should have regular mammograms. Talk with your health care provider about your test results, treatment options, and if necessary, the need for more tests. Vaccines  Your health care provider may recommend certain vaccines, such as: Influenza vaccine. This is recommended every year. Tetanus, diphtheria, and acellular pertussis (Tdap, Td) vaccine. You may need a Td booster every 10 years. Zoster vaccine. You may need this after age 56. Pneumococcal 13-valent conjugate (PCV13) vaccine. One dose is recommended after age 5. Pneumococcal polysaccharide (PPSV23) vaccine. One dose is recommended after age 20. Talk to your health care provider about which screenings and vaccines you need and how often you need them. This information is not intended to replace advice given to you by your health care provider. Make sure you discuss any questions you have with your health care provider. Document Released: 07/23/2015 Document Revised: 03/15/2016 Document Reviewed: 04/27/2015 Elsevier Interactive Patient Education  2017 [place] Prevention in the Home Falls can cause injuries. They can happen to people of all ages. There are many things you can do to make your home safe and to help prevent falls. What can I do on the outside of my home? Regularly fix the edges of walkways and driveways and fix any cracks. Remove anything that might make you trip as you walk through a door, such as a raised step or threshold. Trim any bushes or trees on the path to your home. Use bright outdoor lighting. Clear any walking paths of anything that might make someone trip, such as rocks or tools. Regularly check to see if handrails are loose or broken. Make sure that both sides of any steps have handrails. Any raised decks and porches  should have guardrails on the edges. Have any leaves, snow, or ice cleared regularly. Use sand or salt on walking paths during winter. Clean up any spills in your garage right away. This includes oil or grease spills. What can I do in the bathroom? Use night lights. Install grab bars by the toilet and in the tub and shower. Do not use towel bars as grab bars. Use non-skid mats or decals in the tub or shower. If you need to sit down in the shower, use a plastic, non-slip stool. Keep the floor dry. Clean up any water that spills on the floor as soon as it happens. Remove soap buildup in the tub or shower regularly. Attach bath mats securely with double-sided non-slip rug tape. Do not have throw rugs and other things on the floor that can make you trip. What can I do in the bedroom? Use night lights. Make sure that you have a light by your bed that is easy to reach. Do not use any sheets or blankets that are too big for your bed. They should not  hang down onto the floor. Have a firm chair that has side arms. You can use this for support while you get dressed. Do not have throw rugs and other things on the floor that can make you trip. What can I do in the kitchen? Clean up any spills right away. Avoid walking on wet floors. Keep items that you use a lot in easy-to-reach places. If you need to reach something above you, use a strong step stool that has a grab bar. Keep electrical cords out of the way. Do not use floor polish or wax that makes floors slippery. If you must use wax, use non-skid floor wax. Do not have throw rugs and other things on the floor that can make you trip. What can I do with my stairs? Do not leave any items on the stairs. Make sure that there are handrails on both sides of the stairs and use them. Fix handrails that are broken or loose. Make sure that handrails are as long as the stairways. Check any carpeting to make sure that it is firmly attached to the stairs.  Fix any carpet that is loose or worn. Avoid having throw rugs at the top or bottom of the stairs. If you do have throw rugs, attach them to the floor with carpet tape. Make sure that you have a light switch at the top of the stairs and the bottom of the stairs. If you do not have them, ask someone to add them for you. What else can I do to help prevent falls? Wear shoes that: Do not have high heels. Have rubber bottoms. Are comfortable and fit you well. Are closed at the toe. Do not wear sandals. If you use a stepladder: Make sure that it is fully opened. Do not climb a closed stepladder. Make sure that both sides of the stepladder are locked into place. Ask someone to hold it for you, if possible. Clearly mark and make sure that you can see: Any grab bars or handrails. First and last steps. Where the edge of each step is. Use tools that help you move around (mobility aids) if they are needed. These include: Canes. Walkers. Scooters. Crutches. Turn on the lights when you go into a dark area. Replace any light bulbs as soon as they burn out. Set up your furniture so you have a clear path. Avoid moving your furniture around. If any of your floors are uneven, fix them. If there are any pets around you, be aware of where they are. Review your medicines with your doctor. Some medicines can make you feel dizzy. This can increase your chance of falling. Ask your doctor what other things that you can do to help prevent falls. This information is not intended to replace advice given to you by your health care provider. Make sure you discuss any questions you have with your health care provider. Document Released: 04/22/2009 Document Revised: 12/02/2015 Document Reviewed: 07/31/2014 Elsevier Interactive Patient Education  2017 [employer].

## 2022-09-26 NOTE — Progress Notes (Signed)
I connected with  Dana Sandoval on 09/26/22 by a audio enabled telemedicine application and verified that I am speaking with the correct person using two identifiers.  Patient Location: Home  Provider Location: Office/Clinic  I discussed the limitations of evaluation and management by telemedicine. The patient expressed understanding and agreed to proceed.  Subjective:   Dana Sandoval is a 82 y.o. female who presents for Medicare Annual (Subsequent) preventive examination.  Review of Systems      Cardiac Risk Factors include: advanced age (>90men, >70 women);hypertension;diabetes mellitus;dyslipidemia;sedentary lifestyle     Objective:    Today's Vitals   09/26/22 1354  Weight: 162 lb (73.5 kg)  Height: 5\' 2"  (1.575 m)   Body mass index is 29.63 kg/m.     09/26/2022    2:00 PM 09/28/2021    8:00 PM 08/30/2021    1:22 PM 02/05/2020    1:18 PM 09/10/2018    8:30 AM 09/02/2018    8:21 AM  Advanced Directives  Does Patient Have a Medical Advance Directive? Yes Yes Yes Yes Yes Yes  Type of Paramedic of [place];Living will Living will;Healthcare Power of Attorney Living will [place];Living will [place];Living will [place];Living will  Does patient want to make changes to medical advance directive?  No - Patient declined   No - Patient declined No - Patient declined  Copy of [place] in Chart? No - copy requested No - copy requested  No - copy requested No - copy requested     Current Medications (verified) Outpatient Encounter Medications as of 09/26/2022  Medication Sig   Accu-Chek Softclix Lancets lancets Use to check blood sugar daily. Dx E11.9   apixaban (ELIQUIS) 5 MG TABS tablet TAKE 1 TABLET BY MOUTH TWICE (2) DAILY   atorvastatin (LIPITOR) 40 MG tablet TAKE 1 TABLET BY MOUTH ONCE A DAY AT 6PM   Blood Glucose Monitoring Suppl (ACCU-CHEK GUIDE) w/Device KIT Use to  check blood sugar daily. Dx E11.9   glucose blood (ACCU-CHEK GUIDE) test strip Use to check blood sugar daily. Dx E11.9   HYDROcodone-acetaminophen (NORCO/VICODIN) 5-325 MG tablet Take 1 tablet by mouth every 6 (six) hours as needed for moderate pain.   levothyroxine (SYNTHROID) 88 MCG tablet TAKE 1 TABLET BY MOUTH EVERY DAY   metFORMIN (GLUCOPHAGE) 500 MG tablet TAKE 1 TABLET BY MOUTH TWICE A DAY   Multiple Vitamins-Minerals (PRESERVISION AREDS 2 PO) Take 1 tablet by mouth 2 (two) times daily.    ramipril (ALTACE) 10 MG capsule TAKE 1 CAPSULE BY MOUTH EVERY DAY   vitamin B-12 (CYANOCOBALAMIN) 1000 MCG tablet Take 1 tablet (1,000 mcg total) by mouth daily.   vitamin C (ASCORBIC ACID) 250 MG tablet Take 500 mg by mouth daily.   VITAMIN D PO Take 1,000 Units by mouth daily.   benzonatate (TESSALON) 200 MG capsule Take 1 capsule (200 mg total) by mouth 3 (three) times daily as needed for cough. (Patient not taking: Reported on 09/26/2022)   valACYclovir (VALTREX) 500 MG tablet Take 500 mg by mouth daily as needed (fever blister). (Patient not taking: Reported on 09/26/2022)   No facility-administered encounter medications on file as of 09/26/2022.    Allergies (verified) Codeine and Dilaudid [hydromorphone hcl]   History: Past Medical History:  Diagnosis Date   Aortic stenosis    Arthritis    B12 deficiency    Diabetes mellitus    type 2  Heart murmur    aortic stenosis   Hyperlipidemia    Hypothyroidism    IBS (irritable bowel syndrome)    Macular degeneration    Macular degeneration of right eye    NSVD (normal spontaneous vaginal delivery)    X 4   Pain    INTRASCAPULAR PAIN   SAB (spontaneous abortion)    X 2   Thyroid disease    HYPOTHYROIDISM   Past Surgical History:  Procedure Laterality Date   ABDOMINAL HYSTERECTOMY  1977   TAH  (FIBROIDS)   CATARACT EXTRACTION Bilateral    DILATION AND CURETTAGE OF UTERUS     DOPPLER ECHOCARDIOGRAPHY  2009   HAD AN ECHOCARDIOGRAM  THAT SHOWED MODERATE MITRAL  ANNULAR CALCIFICATION AND A NORMAL EJECTION FRACTION OF 55%   EYE SURGERY Bilateral    Cat Sx   OTHER SURGICAL HISTORY     HYSTERECTOMY   SHOULDER SURGERY     RIGHT SHOULDER SURGERY   THYROIDECTOMY     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   RIGHT   TOTAL KNEE ARTHROPLASTY Left 09/10/2018   Procedure: TOTAL KNEE ARTHROPLASTY;  Surgeon: Melrose Nakayama, MD;  Location: [facility];  Service: Orthopedics;  Laterality: Left;   Family History  Problem Relation Age of Onset   Osteoporosis Mother    Heart disease Mother    Macular degeneration Mother    Heart disease Father    Cancer Father        BRAIN TUMOR   Cancer Brother        BRAIN TUMOR   Diabetes Maternal Aunt    Diabetes Maternal Uncle    Stroke Paternal Grandfather    Colon cancer Neg Hx    Breast cancer Neg Hx    Migraines Neg Hx    Social History   Socioeconomic History   Marital status: Married    Spouse name: Not on file   Number of children: 4   Years of education: Not on file   Highest education level: Not on file  Occupational History   Not on file  Tobacco Use   Smoking status: Never   Smokeless tobacco: Never  Vaping Use   Vaping Use: Never used  Substance and Sexual Activity   Alcohol use: No    Alcohol/week: 0.0 standard drinks of alcohol   Drug use: No   Sexual activity: Yes    Partners: Male    Birth control/protection: Surgical    Comment: 1st intercourse- 17, partners- 58, married- 62 yrs   Other Topics Concern   Not on file  Social History Narrative   Lives with husband, Dana Sandoval- daughter, documents at home   Social Determinants of Health   Financial Resource Strain: [place]  (09/26/2022)   Overall Financial Resource Strain (CARDIA)    Difficulty of Paying Living Expenses: Not hard at all  Food Insecurity: No Food Insecurity (09/26/2022)   Hunger Vital Sign    Worried About Running Out of Food in the Last Year: Never true    [place] in the Last  Year: Never true  Transportation Needs: No Transportation Needs (09/26/2022)   PRAPARE - Hydrologist (Medical): No    Lack of Transportation (Non-Medical): No  Physical Activity: Inactive (09/26/2022)   Exercise Vital Sign    Days of Exercise per Week: 0 days    Minutes of Exercise per Session: 0 min  Stress: No Stress Concern Present (09/26/2022)   [place]  [employer] - Occupational Stress Questionnaire    Feeling of Stress : Not at all  Social Connections: Moderately Integrated (09/26/2022)   Social Connection and Isolation Panel [NHANES]    Frequency of Communication with Friends and Family: More than three times a week    Frequency of Social Gatherings with Friends and Family: More than three times a week    Attends Religious Services: 1 to 4 times per year    Active Member of [employer] or Organizations: No    Attends Music therapist: Never    Marital Status: Married    Tobacco Counseling Counseling given: Not Answered   Clinical Intake:  Pre-visit preparation completed: Yes  Pain : No/denies pain     Nutritional Risks: None Diabetes: Yes CBG done?: No Did pt. bring in CBG monitor from home?: No  How often do you need to have someone help you when you read instructions, pamphlets, or other written materials from your doctor or pharmacy?: 1 - Never  Diabetic?Nutrition Risk Assessment:  Has the patient had any N/V/D within the last 2 months?  No  Does the patient have any non-healing wounds?  No  Has the patient had any unintentional weight loss or weight gain?  No   Diabetes:  Is the patient diabetic?  Yes  If diabetic, was a CBG obtained today?  No  Did the patient bring in their glucometer from home?  No  How often do you monitor your CBG's? THREE TIMES A WEEK.   Financial Strains and Diabetes Management:  Are you having any financial strains with the device, your supplies or your medication? No .   Does the patient want to be seen by Chronic Care Management for management of their diabetes?  No  Would the patient like to be referred to a Nutritionist or for Diabetic Management?  No   Diabetic Exams:  Diabetic Eye Exam: Completed 07/13/2022 [facility] Diabetic Foot Exam: Completed 09/15/21 PCP    Interpreter Needed?: No  Information entered by :: C.Rodolfo Notaro LPN   Activities of Daily Living    09/26/2022    2:01 PM 09/28/2021    8:00 PM  In your present state of health, do you have any difficulty performing the following activities:  Hearing? 0 0  Vision? 0 0  Difficulty concentrating or making decisions? 0 0  Walking or climbing stairs? 0 0  Dressing or bathing? 0 0  Doing errands, shopping? 1 0  Comment Daughter assists   Preparing Food and eating ? N   Using the Toilet? N   In the past six months, have you accidently leaked urine? N   Do you have problems with loss of bowel control? N   Managing your Medications? N   Managing your Finances? N   Housekeeping or managing your Housekeeping? N     Patient Care Team: Tonia Ghent, MD as PCP - General (Family Medicine)  Indicate any recent Medical Services you may have received from other than [facility] providers in the past year (date may be approximate).     Assessment:   This is a routine wellness examination for Dana Sandoval.  Hearing/Vision screen Hearing Screening - Comments:: none Vision Screening - Comments:: Glasses - [facility]  Dietary issues and exercise activities discussed: Current Exercise Habits: The patient does not participate in regular exercise at present, Exercise limited by: None identified   Goals Addressed  This Visit's Progress    Patient Stated       No new goals.       Depression Screen    09/26/2022    2:00 PM 08/30/2021    1:21 PM 07/20/2020    8:25 AM 02/05/2020    1:19 PM 01/29/2019    2:37 PM 03/20/2018   10:35 AM  PHQ 2/9 Scores  PHQ  - 2 Score 0 0 0 0 0 0  PHQ- 9 Score    0      Fall Risk    09/26/2022    2:01 PM 08/30/2021    1:23 PM 07/20/2020    8:25 AM 02/05/2020    1:18 PM 01/29/2019    2:37 PM  [place] in the past year? 0 0 0 0 0  Number falls in past yr: 0 0 0 0   Injury with Fall? 0 0 0 0   Risk for fall due to : No Fall Risks Impaired vision  Medication side effect   Follow up Falls prevention discussed;Falls evaluation completed Falls prevention discussed Falls evaluation completed Falls evaluation completed;Falls prevention discussed     FALL RISK PREVENTION PERTAINING TO THE HOME:  Any stairs in or around the home? Yes  If so, are there any without handrails? No  Home free of loose throw rugs in walkways, pet beds, electrical cords, etc? Yes  Adequate lighting in your home to reduce risk of falls? Yes   ASSISTIVE DEVICES UTILIZED TO PREVENT FALLS:  Life alert? No  Use of a cane, walker or w/c? No  Grab bars in the bathroom? Yes  Shower chair or bench in shower? Yes  Elevated toilet seat or a handicapped toilet? Yes    Cognitive Function:    02/05/2020    1:20 PM  MMSE - Mini Mental State Exam  Orientation to time 5  Orientation to Place 5  Registration 3  Attention/ Calculation 5  Recall 3  Language- repeat 1        09/26/2022    2:02 PM 08/30/2021    1:25 PM  6CIT Screen  What Year? 0 points 0 points  What month? 0 points 0 points  What time? 0 points 0 points  Count back from 20 0 points 0 points  Months in reverse 0 points 0 points  Repeat phrase 0 points 0 points  Total Score 0 points 0 points    Immunizations Immunization History  Administered Date(s) Administered   Fluad Quad(high Dose 65+) 05/07/2019, 06/03/2019   Influenza, High Dose Seasonal PF 03/20/2018   Influenza-Unspecified 05/10/2021   Moderna Sars-Covid-2 Vaccination 10/07/2019, 11/04/2019, 06/30/2020   Pneumococcal Conjugate-13 04/22/2017   Pneumococcal Polysaccharide-23 04/10/2008, 04/22/2018    Tdap 07/09/2012    TDAP status: Due, Education has been provided regarding the importance of this vaccine. Advised may receive this vaccine at local pharmacy or Health Dept. Aware to provide a copy of the vaccination record if obtained from local pharmacy or Health Dept. Verbalized acceptance and understanding.  Flu Vaccine status: Up to date [place]  Pneumococcal vaccine status: Up to date  Covid-19 vaccine status: Declined, Education has been provided regarding the importance of this vaccine but patient still declined. Advised may receive this vaccine at local pharmacy or Health Dept.or vaccine clinic. Aware to provide a copy of the vaccination record if obtained from local pharmacy or Health Dept. Verbalized acceptance and understanding.  Qualifies for Shingles Vaccine? Yes   Zostavax completed Yes  Shingrix Completed?: No.    Education has been provided regarding the importance of this vaccine. Patient has been advised to call insurance company to determine out of pocket expense if they have not yet received this vaccine. Advised may also receive vaccine at local pharmacy or Health Dept. Verbalized acceptance and understanding.  Screening Tests Health Maintenance  Topic Date Due   Zoster Vaccines- Shingrix (1 of 2) Never done   COVID-19 Vaccine (4 - 2023-24 season) 03/10/2022   DTaP/Tdap/Td (2 - Td or Tdap) 07/09/2022   FOOT EXAM  09/16/2022   INFLUENZA VACCINE  10/08/2022 (Originally 02/07/2022)   HEMOGLOBIN A1C  03/25/2023   MAMMOGRAM  06/20/2023   OPHTHALMOLOGY EXAM  07/14/2023   Diabetic kidney evaluation - eGFR measurement  09/22/2023   Diabetic kidney evaluation - Urine ACR  09/22/2023   Medicare Annual Wellness (AWV)  09/26/2023   Pneumonia Vaccine 24+ Years old  Completed   DEXA SCAN  Completed   HPV VACCINES  Aged Out   PAP SMEAR-Modifier  Discontinued    Health Maintenance  Health Maintenance Due  Topic Date Due   Zoster Vaccines- Shingrix (1 of 2)  Never done   COVID-19 Vaccine (4 - 2023-24 season) 03/10/2022   DTaP/Tdap/Td (2 - Td or Tdap) 07/09/2022   FOOT EXAM  09/16/2022    Colorectal cancer screening: No longer required.   Mammogram status: Completed 06/19/2022. Repeat every year  Bone Density - Declined  Lung Cancer Screening: (Low Dose CT Chest recommended if Age 54-80 years, 30 pack-year currently smoking OR have quit w/in 15years.) does not qualify.   Lung Cancer Screening Referral: no  Additional Screening:  Hepatitis C Screening: does not qualify; Completed no  Vision Screening: Recommended annual ophthalmology exams for early detection of glaucoma and other disorders of the eye. Is the patient up to date with their annual eye exam?  Yes  Who is the provider or what is the name of the office in which the patient attends annual eye exams? [facility] If pt is not established with a provider, would they like to be referred to a provider to establish care? No .   Dental Screening: Recommended annual dental exams for proper oral hygiene  Community Resource Referral / Chronic Care Management: CRR required this visit?  No   CCM required this visit?  No      Plan:     I have personally reviewed and noted the following in the patient's chart:   Medical and social history Use of alcohol, tobacco or illicit drugs  Current medications and supplements including opioid prescriptions. Patient is currently taking opioid prescriptions. Information provided to patient regarding non-opioid alternatives. Patient advised to discuss non-opioid treatment plan with their provider. Functional ability and status Nutritional status Physical activity Advanced directives List of other physicians Hospitalizations, surgeries, and ER visits in previous 12 months Vitals Screenings to include cognitive, depression, and falls Referrals and appointments  In addition, I have reviewed and discussed with patient certain  preventive protocols, quality metrics, and best practice recommendations. A written personalized care plan for preventive services as well as general preventive health recommendations were provided to patient.     Lebron Conners, LPN   [id number]   Nurse Notes: Pt declines shingles and covid vaccines. Pt declined bone scan.

## 2022-09-29 ENCOUNTER — Encounter: Payer: Medicare PPO | Admitting: Family Medicine

## 2022-10-04 NOTE — Progress Notes (Signed)
[facility] - Clinic Note  10/06/2022     CHIEF COMPLAINT Patient presents for Retina Follow Up  HISTORY OF PRESENT ILLNESS: Dana Sandoval is a 82 y.o. 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  This started weeks ago.  Duration of 4 weeks.  I, the attending physician,  performed the HPI with the patient and updated documentation appropriately.        Comments   Patient feels that the vision is the same. She is not using any eye drops at this time. Her blood was 120.      Last edited by Rennis Chris, MD on 10/07/2022  1:53 AM.     Pt states   Referring physician: Joaquim Nam, MD [address] [place],  [place] [address]  HISTORICAL INFORMATION:  Selected notes from the MEDICAL RECORD NUMBER Referred by Dr. Laruth Bouchard for eval of ret heme OD   CURRENT MEDICATIONS: No current outpatient medications on file. (Ophthalmic Drugs)   No current facility-administered medications for this visit. (Ophthalmic Drugs)   Current Outpatient Medications (Other)  Medication Sig   Accu-Chek Softclix Lancets lancets Use to check blood sugar daily. Dx E11.9   apixaban (ELIQUIS) 5 MG TABS tablet TAKE 1 TABLET BY MOUTH TWICE (2) DAILY   atorvastatin (LIPITOR) 40 MG tablet TAKE 1 TABLET BY MOUTH ONCE A DAY AT 6PM   benzonatate (TESSALON) 200 MG capsule Take 1 capsule (200 mg total) by mouth 3 (three) times daily as needed for cough. (Patient not taking: Reported on 09/26/2022)   Blood Glucose Monitoring Suppl (ACCU-CHEK GUIDE) w/Device KIT Use to check blood sugar daily. Dx E11.9   glucose blood (ACCU-CHEK GUIDE) test strip USE TO CHECK BLOOD SUGAR DAILY   HYDROcodone-acetaminophen (NORCO/VICODIN) 5-325 MG tablet Take 1 tablet by mouth every 6 (six) hours as needed for moderate pain.   levothyroxine (SYNTHROID) 88 MCG tablet TAKE 1 TABLET BY MOUTH EVERY DAY   metFORMIN (GLUCOPHAGE)  500 MG tablet TAKE 1 TABLET BY MOUTH TWICE A DAY   Multiple Vitamins-Minerals (PRESERVISION AREDS 2 PO) Take 1 tablet by mouth 2 (two) times daily.    ramipril (ALTACE) 10 MG capsule TAKE 1 CAPSULE BY MOUTH EVERY DAY   valACYclovir (VALTREX) 500 MG tablet Take 500 mg by mouth daily as needed (fever blister). (Patient not taking: Reported on 09/26/2022)   vitamin B-12 (CYANOCOBALAMIN) 1000 MCG tablet Take 1 tablet (1,000 mcg total) by mouth daily.   vitamin C (ASCORBIC ACID) 250 MG tablet Take 500 mg by mouth daily.   VITAMIN D PO Take 1,000 Units by mouth daily.   No current facility-administered medications for this visit. (Other)   REVIEW OF SYSTEMS: ROS   Positive for: Endocrine, Cardiovascular, Eyes Negative for: Constitutional, Gastrointestinal, Neurological, Skin, Genitourinary, Musculoskeletal, HENT, Respiratory, Psychiatric, Allergic/Imm, Heme/Lymph Last edited by Julieanne Cotton, COT on 10/06/2022  7:57 AM.       ALLERGIES Allergies  Allergen Reactions   Codeine Itching    After 3 days   Dilaudid [Hydromorphone Hcl] Itching    After 3 days   PAST MEDICAL HISTORY Past Medical History:  Diagnosis Date   Aortic stenosis    Arthritis    B12 deficiency    Diabetes mellitus    type 2   Heart murmur    aortic stenosis   Hyperlipidemia  Hypothyroidism    IBS (irritable bowel syndrome)    Macular degeneration    Macular degeneration of right eye    NSVD (normal spontaneous vaginal delivery)    X 4   Pain    INTRASCAPULAR PAIN   SAB (spontaneous abortion)    X 2   Thyroid disease    HYPOTHYROIDISM   Past Surgical History:  Procedure Laterality Date   ABDOMINAL HYSTERECTOMY  1977   TAH  (FIBROIDS)   CATARACT EXTRACTION Bilateral    DILATION AND CURETTAGE OF UTERUS     DOPPLER ECHOCARDIOGRAPHY  2009   HAD AN ECHOCARDIOGRAM THAT SHOWED MODERATE MITRAL  ANNULAR CALCIFICATION AND A NORMAL EJECTION FRACTION OF 55%   EYE SURGERY Bilateral    Cat Sx   OTHER  SURGICAL HISTORY     HYSTERECTOMY   SHOULDER SURGERY     RIGHT SHOULDER SURGERY   THYROIDECTOMY     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   RIGHT   TOTAL KNEE ARTHROPLASTY Left 09/10/2018   Procedure: TOTAL KNEE ARTHROPLASTY;  Surgeon: Marcene Corning, MD;  Location: [facility];  Service: Orthopedics;  Laterality: Left;   FAMILY HISTORY Family History  Problem Relation Age of Onset   Osteoporosis Mother    Heart disease Mother    Macular degeneration Mother    Heart disease Father    Cancer Father        BRAIN TUMOR   Cancer Brother        BRAIN TUMOR   Diabetes Maternal Aunt    Diabetes Maternal Uncle    Stroke Paternal Grandfather    Colon cancer Neg Hx    Breast cancer Neg Hx    Migraines Neg Hx    SOCIAL HISTORY Social History   Tobacco Use   Smoking status: Never   Smokeless tobacco: Never  Vaping Use   Vaping Use: Never used  Substance Use Topics   Alcohol use: No    Alcohol/week: 0.0 standard drinks of alcohol   Drug use: No       OPHTHALMIC EXAM: Base Eye Exam     Visual Acuity (Snellen - Linear)       Right Left   Dist [place] 20/40 +1 20/30   Dist ph [place] NI NI         Tonometry (Tonopen, 8:00 AM)       Right Left   Pressure 17 15         Pupils       Dark Light Shape React APD   Right 3 2 Round Brisk None   Left 3 2 Round Brisk None         Visual Fields       Left Right    Full Full         Extraocular Movement       Right Left    Full, Ortho Full, Ortho         Neuro/Psych     Oriented x3: Yes   Mood/Affect: Normal         Dilation     Both eyes: 1.0% Mydriacyl, 2.5% Phenylephrine @ 7:58 AM           Slit Lamp and Fundus Exam     Slit Lamp Exam       Right Left   Lids/Lashes Dermatochalasis - upper lid Dermatochalasis - upper lid   Conjunctiva/Sclera White and quiet White and quiet   Cornea 1+ Punctate epithelial erosions, fine endo pigment, trace  tear film debris 1+ fine Punctate epithelial erosions, fine  endo pigment, trace tear film debris   Anterior Chamber Deep and quiet Deep and quiet   Iris Round and dilated Round and dilated   Lens Toric PC IOL with marks at 1200 and 0600, 1+ Posterior capsular opacification nasal side approaching visual axis Toric PC IOL with marks at 1200 and 0600, 1+ Posterior capsular opacification   Anterior Vitreous Vitreous syneresis, trace pigment, Posterior vitreous detachment, vitreous condensations Vitreous syneresis, Posterior vitreous detachment, vitreous condensations         Fundus Exam       Right Left   Disc mild Pallor, Sharp rim, +cupping, PPA mild Pallor, Sharp rim, +cupping, PPA, +SVP   C/D Ratio 0.7 0.7   Macula Blunted foveal reflex, central CNV with subretinal heme -- stably resolved -- now with pigment ring, RPE mottling, clumping and atrophy, Drusen, stable improvement in shallow SRF overlying CNV, cystic changes temporal mac -- slightly increased Flat, Blunted foveal reflex, Drusen, RPE mottling, clumping and atrophy, focal IRH/SRH inferior to fovea -- stably improved, scattered patches of GA   Vessels attenuated, mild tortuosity, mild AV crossing changes mild attenuation, mild tortuosity   Periphery Attached, reticular degeneration, paving stone degeneration IT quad, No heme Attached, reticular degeneration, mild inferior paving stone degeneration, No heme           IMAGING AND PROCEDURES  Imaging and Procedures for 10/06/2022  OCT, Retina - OU - Both Eyes       Right Eye Quality was good. Central Foveal Thickness: 317. Progression has worsened. Findings include no IRF, no SRF, abnormal foveal contour, retinal drusen , subretinal hyper-reflective material, pigment epithelial detachment, outer retinal atrophy (Stable improvement in SRF overlying PED; trace persistent cystic changes / IRF temporal fovea overlying central PED / [facility] -- slightly increased).   Left Eye Quality was good. Central Foveal Thickness: 221. Progression has been  stable. Findings include normal foveal contour, no IRF, no SRF, retinal drusen , outer retinal atrophy (stable improvement in [facility] and edema inferior fovea and macula).   Notes *Images captured and stored on drive  Diagnosis / Impression:  Ex ARMD OU OD: Stable improvement in SRF overlying PED; trace persistent cystic changes / IRF temporal fovea overlying central PED / [facility] -- slightly increased OS: stable improvement in [facility] and edema inferior fovea and macula  Clinical management:  See below  Abbreviations: NFP - Normal foveal profile. CME - cystoid macular edema. PED - pigment epithelial detachment. IRF - intraretinal fluid. SRF - subretinal fluid. EZ - ellipsoid zone. ERM - epiretinal membrane. ORA - outer retinal atrophy. ORT - outer retinal tubulation. SRHM - subretinal hyper-reflective material. IRHM - intraretinal hyper-reflective material      Intravitreal Injection, Pharmacologic Agent - OD - Right Eye       Time Out 10/06/2022. 8:14 AM. Confirmed correct patient, procedure, site, and patient consented.   Anesthesia Topical anesthesia was used. Anesthetic medications included Lidocaine 2%, Proparacaine 0.5%.   Procedure Preparation included 5% betadine to ocular surface, eyelid speculum. A (32g) needle was used.   Injection: 2 mg aflibercept 2 MG/0.05ML   Route: Intravitreal, Site: Right Eye   NDC: L6038910, Lot: [id number], Expiration date: 10/08/2023, Waste: 0 mL   Post-op Post injection exam found visual acuity of at least counting fingers. The patient tolerated the procedure well. There were no complications. The patient received written and verbal post procedure care education. Post injection medications were not given.  Intravitreal Injection, Pharmacologic Agent - OS - Left Eye       Time Out 10/06/2022. 8:14 AM. Confirmed correct patient, procedure, site, and patient consented.   Anesthesia Topical anesthesia was used. Anesthetic medications  included Lidocaine 2%, Proparacaine 0.5%.   Procedure Preparation included 5% betadine to ocular surface, eyelid speculum. A (32g) needle was used.   Injection: 1.25 mg Bevacizumab 1.25mg /0.44ml   Route: Intravitreal, Site: Left Eye   NDC: P3213405, Lot: [id number], Expiration date: 01/07/2023   Post-op Post injection exam found visual acuity of at least counting fingers. The patient tolerated the procedure well. There were no complications. The patient received written and verbal post procedure care education.             ASSESSMENT/PLAN:   ICD-10-CM   1. Exudative age-related macular degeneration of right eye with active choroidal neovascularization (HCC)  H35.3211 OCT, Retina - OU - Both Eyes    Intravitreal Injection, Pharmacologic Agent - OD - Right Eye    aflibercept (EYLEA) SOLN 2 mg    2. Exudative age-related macular degeneration of left eye with active choroidal neovascularization (HCC)  H35.3221 Intravitreal Injection, Pharmacologic Agent - OS - Left Eye    Bevacizumab (AVASTIN) SOLN 1.25 mg    3. Essential hypertension  I10     4. Hypertensive retinopathy of both eyes  H35.033     5. Pseudophakia, both eyes  Z96.1     6. PCO (posterior capsule opacification), bilateral  H26.493      1. Exudative age related macular degeneration, OD  - s/p IVA OD #1 (05.06.22), #2 (06.03.22), #3 (07.01.22), #4 (07.29.22), #5 (08.26.22), #6 (09.23.22), #7 (10.21.22), #8 (11.18.22), #9 (12.16.22), #10 (01.20.23) -- IVA resistance  - s/p IVE OD #1 (02.24.23) -- sample, #2 (03.24.23), #3 (04.21.23), #4 (05.19.23), #5 (06.16.23), #6 (07.21.23), #7 (09.01.23), #8 (10.27.23), #9 (12.15.23) #10 (02.02.24)  - OCT shows Stable improvement in SRF overlying PED; trace persistent cystic changes / IRF temporal fovea overlying central PED / [facility] -- slightly increased at 8 wks since last IVE OS  - BCVA 20/40 OD -- stable  - recommend IVE OD #11 today, 03.29.24  - pt wishes to proceed with  injection  - RBA of procedure discussed, questions answered - see procedure note - IVA informed consent obtained and signed, 05.06.22 (OD) - IVE informed consent obtained and signed, 02.24.23 (OD)  - f/u 5 wks -- DFE/OCT, possible injection, tx and ext as able (extending OS to 10 weeks)  2. Exudative age related macular degeneration, left eye   - early conversion to exudative ARMD -- noted on 01.04.24  - s/p IVA OS #1 (01.04.24), #2 (02.02.24), #3 (03.01.24)  - pt reports new superior paracentral scotoma and +distortion OS -- improved  - BCVA OS 20/30 -- improved  - OCT shows stable improvement in [facility] and edema inferior fovea and macula at 4 wks - recommend IVA OS #4 today, 03.29.24 w/ f/u ext to 5 wks - pt wishes to be treated with IVA  - RBA of procedure discussed, questions answered - informed consent obtained and signed - see procedure note  - f/u in 5 wks, DFE, OCT, possible injxns  3,4. Hypertensive retinopathy OU - discussed importance of tight BP control -  monitor  5. Pseudophakia OU  - s/p CE/IOL  - IOL in good position, doing well  - monitor   6. PCO OU (OD > OS)  - monitor   Ophthalmic Meds Ordered this visit:  Meds ordered  this encounter  Medications   aflibercept (EYLEA) SOLN 2 mg   Bevacizumab (AVASTIN) SOLN 1.25 mg     Return in about 5 weeks (around 11/10/2022) for f/u exu ARMD OU, DFE, OCT.  There are no Patient Instructions on file for this visit.  This document serves as a record of services personally performed by Karie Chimera, MD, PhD. It was created on their behalf by Glee Arvin. Manson Passey, OA an ophthalmic technician. The creation of this record is the provider's dictation and/or activities during the visit.    Electronically signed by: Glee Arvin. Manson Passey, [place] 03.27.2024 1:53 AM  Karie Chimera, M.D., Ph.D. Diseases & Surgery of the Retina and Vitreous [facility]  I have reviewed the above documentation for accuracy and  completeness, and I agree with the above. Karie Chimera, M.D., Ph.D. 10/07/22 1:55 AM   Abbreviations: M myopia (nearsighted); A astigmatism; H hyperopia (farsighted); P presbyopia; Mrx spectacle prescription;  CTL contact lenses; OD right eye; OS left eye; OU both eyes  XT exotropia; ET esotropia; PEK punctate epithelial keratitis; PEE punctate epithelial erosions; DES dry eye syndrome; MGD meibomian gland dysfunction; ATs artificial tears; PFAT's preservative free artificial tears; NSC nuclear sclerotic cataract; PSC posterior subcapsular cataract; ERM epi-retinal membrane; PVD posterior vitreous detachment; RD retinal detachment; DM diabetes mellitus; DR diabetic retinopathy; NPDR non-proliferative diabetic retinopathy; PDR proliferative diabetic retinopathy; CSME clinically significant macular edema; DME diabetic macular edema; dbh dot blot hemorrhages; CWS cotton wool spot; POAG primary open angle glaucoma; C/D cup-to-disc ratio; HVF humphrey visual field; GVF goldmann visual field; OCT optical coherence tomography; IOP intraocular pressure; BRVO Branch retinal vein occlusion; CRVO central retinal vein occlusion; CRAO central retinal artery occlusion; BRAO branch retinal artery occlusion; RT retinal tear; SB scleral buckle; PPV pars plana vitrectomy; VH Vitreous hemorrhage; PRP panretinal laser photocoagulation; IVK intravitreal kenalog; VMT vitreomacular traction; MH Macular hole;  NVD neovascularization of the disc; NVE neovascularization elsewhere; AREDS age related eye disease study; ARMD age related macular degeneration; POAG primary open angle glaucoma; EBMD epithelial/anterior basement membrane dystrophy; ACIOL anterior chamber intraocular lens; IOL intraocular lens; PCIOL posterior chamber intraocular lens; Phaco/IOL phacoemulsification with intraocular lens placement; PRK photorefractive keratectomy; LASIK laser assisted in situ keratomileusis; HTN hypertension; DM diabetes mellitus; COPD chronic  obstructive pulmonary disease

## 2022-10-06 ENCOUNTER — Ambulatory Visit (INDEPENDENT_AMBULATORY_CARE_PROVIDER_SITE_OTHER): Payer: HMO | Admitting: Ophthalmology

## 2022-10-06 ENCOUNTER — Encounter: Payer: Medicare PPO | Admitting: Family Medicine

## 2022-10-06 ENCOUNTER — Encounter (INDEPENDENT_AMBULATORY_CARE_PROVIDER_SITE_OTHER): Payer: Self-pay | Admitting: Ophthalmology

## 2022-10-06 DIAGNOSIS — Z961 Presence of intraocular lens: Secondary | ICD-10-CM

## 2022-10-06 DIAGNOSIS — H353231 Exudative age-related macular degeneration, bilateral, with active choroidal neovascularization: Secondary | ICD-10-CM | POA: Diagnosis not present

## 2022-10-06 DIAGNOSIS — H353211 Exudative age-related macular degeneration, right eye, with active choroidal neovascularization: Secondary | ICD-10-CM

## 2022-10-06 DIAGNOSIS — I10 Essential (primary) hypertension: Secondary | ICD-10-CM

## 2022-10-06 DIAGNOSIS — H26493 Other secondary cataract, bilateral: Secondary | ICD-10-CM | POA: Diagnosis not present

## 2022-10-06 DIAGNOSIS — H353221 Exudative age-related macular degeneration, left eye, with active choroidal neovascularization: Secondary | ICD-10-CM

## 2022-10-06 DIAGNOSIS — H35033 Hypertensive retinopathy, bilateral: Secondary | ICD-10-CM | POA: Diagnosis not present

## 2022-10-06 MED ORDER — BEVACIZUMAB CHEMO INJECTION 1.25MG/0.05ML SYRINGE FOR KALEIDOSCOPE
1.2500 mg | INTRAVITREAL | Status: AC | PRN
  Administered 2022-10-06: 1.25 mg via INTRAVITREAL

## 2022-10-06 MED ORDER — AFLIBERCEPT 2MG/0.05ML IZ SOLN FOR KALEIDOSCOPE
2.0000 mg | INTRAVITREAL | Status: AC | PRN
  Administered 2022-10-06: 2 mg via INTRAVITREAL

## 2022-10-10 ENCOUNTER — Encounter: Payer: Self-pay | Admitting: Family Medicine

## 2022-10-10 ENCOUNTER — Ambulatory Visit (INDEPENDENT_AMBULATORY_CARE_PROVIDER_SITE_OTHER): Payer: HMO | Admitting: Family Medicine

## 2022-10-10 VITALS — BP 110/68 | HR 76 | Temp 97.3°F | Ht 62.0 in | Wt 165.0 lb

## 2022-10-10 DIAGNOSIS — I10 Essential (primary) hypertension: Secondary | ICD-10-CM

## 2022-10-10 DIAGNOSIS — Z7189 Other specified counseling: Secondary | ICD-10-CM

## 2022-10-10 DIAGNOSIS — D649 Anemia, unspecified: Secondary | ICD-10-CM

## 2022-10-10 DIAGNOSIS — E119 Type 2 diabetes mellitus without complications: Secondary | ICD-10-CM | POA: Diagnosis not present

## 2022-10-10 DIAGNOSIS — Z0000 Encounter for general adult medical examination without abnormal findings: Secondary | ICD-10-CM

## 2022-10-10 DIAGNOSIS — E538 Deficiency of other specified B group vitamins: Secondary | ICD-10-CM

## 2022-10-10 DIAGNOSIS — I4891 Unspecified atrial fibrillation: Secondary | ICD-10-CM | POA: Diagnosis not present

## 2022-10-10 DIAGNOSIS — E785 Hyperlipidemia, unspecified: Secondary | ICD-10-CM | POA: Diagnosis not present

## 2022-10-10 DIAGNOSIS — E039 Hypothyroidism, unspecified: Secondary | ICD-10-CM | POA: Diagnosis not present

## 2022-10-10 NOTE — Patient Instructions (Signed)
Don't change your meds for now and recheck labs in about 6 months.   Take care.  Glad to see you.

## 2022-10-10 NOTE — Progress Notes (Signed)
B12 elevation.  Labs d/w pt. On B12 by mouth.    Elevated Cholesterol: Using medications without problems: yes Muscle aches: no Diet compliance: yes Exercise: yes Labs d/w pt.    Hypothyroidism.  No neck mass, still on replacement. Labs d/w pt.  Compliant.    Diabetes:  Using medications without difficulties:yes Hypoglycemic episodes:no Hyperglycemic episodes:no Feet problems:no Blood Sugars averaging: ~95-120s.   eye exam within last year: yes  Hypertension/AS:    Using medication without problems or lightheadedness:  yes Chest pain with exertion:no Edema:no Short of breath:no Labs d/w pt.    H/o AF on monitor, anticoagulated by cards.  No bleeding.  No heart racing.  HGB low but stable.  No bleeding known, no black stools.   Husband designated if patient were incapacitated.   Vaccine d/w pt.  Shingles d/w pt.   RSV d/w pt.   Colon and pap not due.   DXA 2022 mammogram 2023.    PMH and SH reviewed  Meds, vitals, and allergies reviewed.   ROS: Per HPI unless specifically indicated in ROS section   GEN: nad, alert and oriented HEENT: ncat NECK: supple w/o LA CV: rrr with occasional ectopy noted.  Systolic ejection murmur noted PULM: ctab, no inc wob ABD: soft, +bs EXT: no edema SKIN: no acute rash  Diabetic foot exam: Normal inspection No skin breakdown No calluses  Normal DP pulses Normal sensation to light touch and monofilament Nails normal

## 2022-10-11 ENCOUNTER — Ambulatory Visit: Payer: HMO | Admitting: Cardiology

## 2022-10-11 ENCOUNTER — Encounter: Payer: Self-pay | Admitting: Cardiology

## 2022-10-11 VITALS — BP 128/53 | HR 68 | Ht 62.0 in | Wt 166.0 lb

## 2022-10-11 DIAGNOSIS — I480 Paroxysmal atrial fibrillation: Secondary | ICD-10-CM

## 2022-10-11 DIAGNOSIS — I080 Rheumatic disorders of both mitral and aortic valves: Secondary | ICD-10-CM | POA: Diagnosis not present

## 2022-10-11 DIAGNOSIS — I10 Essential (primary) hypertension: Secondary | ICD-10-CM

## 2022-10-11 DIAGNOSIS — I4891 Unspecified atrial fibrillation: Secondary | ICD-10-CM | POA: Insufficient documentation

## 2022-10-11 NOTE — Assessment & Plan Note (Signed)
History of. Anticoagulated by cards.  No bleeding.  No heart racing.  HGB low but stable.  No bleeding known, no black stools.  Continue Eliquis as is.

## 2022-10-11 NOTE — Assessment & Plan Note (Signed)
Now over replaced.  I would prefer her to run slightly high than low.  Considering she is still taking metformin I would continue B12 as is.

## 2022-10-11 NOTE — Assessment & Plan Note (Signed)
Continue metformin.  Continue work on diet and exercise.

## 2022-10-11 NOTE — Assessment & Plan Note (Signed)
Husband designated if patient were incapacitated. 

## 2022-10-11 NOTE — Assessment & Plan Note (Signed)
No neck mass, still on replacement. Labs d/w pt.  Compliant.  Continue levothyroxine as is.

## 2022-10-11 NOTE — Assessment & Plan Note (Signed)
Continue ramipril.  Labs discussed with patient. 

## 2022-10-11 NOTE — Assessment & Plan Note (Signed)
Husband designated if patient were incapacitated.   Vaccine d/w pt.  Shingles d/w pt.   RSV d/w pt.   Colon and pap not due.   DXA 2022 mammogram 2023.

## 2022-10-11 NOTE — Progress Notes (Signed)
Primary Physician/Referring:  Tonia Ghent, MD  Patient ID: Dana Sandoval, female    DOB: 09-22-1940, 82 y.o.   MRN: [id number]  Chief Complaint  Patient presents with   Paroxysmal atrial Fibrillation   Primary hypertension   Mitral and aortic valve disease   Follow-up   HPI:    Dana Sandoval  is a 82 y.o. Caucasian female with history of hyperlipidemia, controlled diabetes, HTN, hypothyroidism, and mild to moderate aortic stenosis and mild to moderate AI, mild to moderate mitral regurgitation, abdominal atherosclerosis and abdominal ectasia, TIA with visual disturbances in Mar 2023 with AF noted on Zio and presently on anticoagulation.  This is an annual visit.  She remains asymptomatic. Past Medical History:  Diagnosis Date   Aortic stenosis    Arthritis    B12 deficiency    Diabetes mellitus    type 2   Heart murmur    aortic stenosis   Hyperlipidemia    Hypertension    Hypothyroidism    IBS (irritable bowel syndrome)    Macular degeneration    Macular degeneration of right eye    NSVD (normal spontaneous vaginal delivery)    X 4   Pain    INTRASCAPULAR PAIN   SAB (spontaneous abortion)    X 2   Thyroid disease    HYPOTHYROIDISM   Past Surgical History:  Procedure Laterality Date   ABDOMINAL HYSTERECTOMY  1977   TAH  (FIBROIDS)   CATARACT EXTRACTION Bilateral    DILATION AND CURETTAGE OF UTERUS     DOPPLER ECHOCARDIOGRAPHY  2009   HAD AN ECHOCARDIOGRAM THAT SHOWED MODERATE MITRAL  ANNULAR CALCIFICATION AND A NORMAL EJECTION FRACTION OF 55%   SHOULDER SURGERY     RIGHT SHOULDER SURGERY   THYROIDECTOMY     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   RIGHT   TOTAL KNEE ARTHROPLASTY Left 09/10/2018   Procedure: TOTAL KNEE ARTHROPLASTY;  Surgeon: Melrose Nakayama, MD;  Location: [facility];  Service: Orthopedics;  Laterality: Left;   Social History   Tobacco Use   Smoking status: Never   Smokeless tobacco: Never  Substance Use Topics   Alcohol use: No     Alcohol/week: 0.0 standard drinks of alcohol   Marital Status: Married   ROS  Review of Systems  Cardiovascular:  Negative for chest pain, dyspnea on exertion and leg swelling.  Gastrointestinal:  Negative for melena.   Objective      10/11/2022   10:06 AM 10/10/2022   10:56 AM 09/26/2022    1:54 PM  Vitals with BMI  Height 5\' 2"  5\' 2"  5\' 2"   Weight 166 lbs 165 lbs 162 lbs  BMI 30.35 [id number]  Systolic 0000000 [id number]   Diastolic 53 68   Pulse 68 76     Blood pressure (!) 128/53, pulse 68, height 5\' 2"  (1.575 m), weight 166 lb (75.3 kg), SpO2 99 %. Body mass index is 30.36 kg/m.   Physical Exam Constitutional:      Appearance: She is well-developed.  Neck:     Vascular: Carotid bruit (bilateral) present. No JVD.  Cardiovascular:     Rate and Rhythm: Normal rate and regular rhythm. Occasional Extrasystoles are present.    Pulses: Normal pulses and intact distal pulses.     Heart sounds: Murmur heard.     Harsh midsystolic murmur is present with a grade of 2/6 at the upper right sternal border radiating to the neck.     Holosystolic murmur of grade  2/6 is also present radiating to the apex.     High-pitched blowing decrescendo early diastolic murmur is present with a grade of 2/4 at the upper right sternal border radiating to the apex.     No gallop.  Pulmonary:     Effort: Pulmonary effort is normal.     Breath sounds: Normal breath sounds.  Abdominal:     General: Bowel sounds are normal.     Palpations: Abdomen is soft.  Musculoskeletal:     Right lower leg: No edema.     Left lower leg: No edema.    Laboratory examination:   Lab Results  Component Value Date   NA 141 09/22/2022   K 4.6 09/22/2022   CO2 27 09/22/2022   GLUCOSE 120 (H) 09/22/2022   BUN 21 09/22/2022   CREATININE 0.81 09/22/2022   CALCIUM 8.7 09/22/2022   GFRNONAA >60 09/28/2021        Latest Ref Rng & Units 09/22/2022    8:13 AM 09/28/2021   12:17 PM 09/28/2021   12:00 PM  CMP  Glucose 70 - 99  mg/dL 120  135  141   BUN 6 - 23 mg/dL 21  16  15    Creatinine 0.40 - 1.20 mg/dL 0.81  0.80  0.83   Sodium 135 - 145 mEq/L 141  142  142   Potassium 3.5 - 5.1 mEq/L 4.6  4.0  3.9   Chloride 96 - 112 mEq/L 106  103  108   CO2 19 - 32 mEq/L 27   26   Calcium 8.4 - 10.5 mg/dL 8.7   9.3   Total Protein 6.0 - 8.3 g/dL 5.7   6.1   Total Bilirubin 0.2 - 1.2 mg/dL 0.4   0.6   Alkaline Phos 39 - 117 U/L 52   54   AST 0 - 37 U/L 12   19   ALT 0 - 35 U/L 7   13       Latest Ref Rng & Units 09/22/2022    8:13 AM 09/28/2021   12:17 PM 09/28/2021   12:00 PM  CBC  WBC 4.0 - 10.5 K/uL 6.8   8.1   Hemoglobin 12.0 - 15.0 g/dL 11.2  11.2  11.5   Hematocrit 36.0 - 46.0 % 33.1  33.0  34.4   Platelets 150.0 - 400.0 K/uL 431.0   388    Lipid Panel     Component Value Date/Time   CHOL 132 09/22/2022 0813   TRIG 82.0 09/22/2022 0813   HDL 35.10 (L) 09/22/2022 0813   CHOLHDL 4 09/22/2022 0813   VLDL 16.4 09/22/2022 0813   LDLCALC 81 09/22/2022 0813     HEMOGLOBIN A1C Lab Results  Component Value Date   HGBA1C 6.1 09/22/2022   MPG 120 (H) 07/13/2010   TSH Recent Labs    09/22/22 0813  TSH 0.48   Radiology:  CT angiogram head and neck 09/28/2021: No emergent large vessel occlusion or high-grade stenosis of the intracranial or cervical arteries.  MRI of the brain without contrast 09/28/2021: Mild atrophy and mild chronic microvascular ischemic change in the white matter.  Cardiac Studies:   Carotid artery duplex  04/03/19  No hemodynamically significant arterial disease in the internal carotid artery bilaterally.  Antegrade right vertebral artery flow. Antegrade left vertebral artery flow. Compared to 04/01/2018, right ICA stenosis of 15-49% not present.   Abdominal Aortic Duplex  04/16/2020:  Mild plaque observed in the mid aorta. Normal flow velocities  noted.  Ectasia in the mid aorta measuring 2.26 x 2.21 x 2.2 cm is seen.  No AAA.   Echocardiogram 09/29/2021:   1. Left  ventricular ejection fraction, by estimation, is 55 to 60%. The left ventricle has normal function. The left ventricle has no regional wall motion abnormalities. There is mild left ventricular hypertrophy. Left ventricular diastolic function  could not be evaluated due to mitral annular calcification;however, based on diastolic parameter normal LAP.  2. Right ventricular systolic function is normal. The right ventricular size is normal. There is normal pulmonary artery systolic pressure.  3. Left atrial size was moderately dilated.  4. Mild mitral subvalvular calcification.  5. The mitral valve is degenerative. Moderate mitral valve regurgitation. No evidence of mitral stenosis. Moderate mitral annular calcification.  6. Moderate aortic valve stenosis (peak velocity 3.54m/s, Mean PG 25 mm Hg, AVA VTI 0.61cm2, DI 0.3). The aortic valve was not well visualized. Aortic valve regurgitation is mild to moderate.  7. The inferior vena cava is normal in size with greater than 50% respiratory variability, suggesting right atrial pressure of 3 mmHg.  Zio Patch Extended out patient EKG monitoring 13 days starting 10/18/2021: Patient had a min HR of 54 bpm, max HR of 191 bpm, and avg HR of 81 bpm. Predominant underlying rhythm was Sinus Rhythm.  1 run of Ventricular Tachycardia occurred lasting 4 beats with a max rate of 124 bpm (avg 117 bpm).  Atrial Fibrillation occurred (5% burden), ranging from 65-191 bpm (avg of 128 bpm), the longest lasting 16 hours 12 mins with an avg rate of 128 bpm. Isolated SVEs were frequent (8.6%, [id number]), rare atrial couplets and triplets. Rare PVCs, ventricular couplets and trigeminy. No symptoms reported.  EKG   EKG 10/11/2022: Normal sinus rhythm at the rate of 70 bpm.  No change from 10/18/2021.   Medications   Allergies  Allergen Reactions   Codeine Itching    After 3 days   Dilaudid [Hydromorphone Hcl] Itching    After 3 days    Current Outpatient Medications:     Accu-Chek Softclix Lancets lancets, Use to check blood sugar daily. Dx E11.9, Disp: 100 each, Rfl: 12   apixaban (ELIQUIS) 5 MG TABS tablet, TAKE 1 TABLET BY MOUTH TWICE (2) DAILY, Disp: 180 tablet, Rfl: 3   atorvastatin (LIPITOR) 40 MG tablet, TAKE 1 TABLET BY MOUTH ONCE A DAY AT 6PM, Disp: 90 tablet, Rfl: 0   Blood Glucose Monitoring Suppl (ACCU-CHEK GUIDE) w/Device KIT, Use to check blood sugar daily. Dx E11.9, Disp: 1 kit, Rfl: 0   glucose blood (ACCU-CHEK GUIDE) test strip, USE TO CHECK BLOOD SUGAR DAILY, Disp: 100 strip, Rfl: 12   levothyroxine (SYNTHROID) 88 MCG tablet, TAKE 1 TABLET BY MOUTH EVERY DAY, Disp: 60 tablet, Rfl: 0   metFORMIN (GLUCOPHAGE) 500 MG tablet, TAKE 1 TABLET BY MOUTH TWICE A DAY, Disp: 120 tablet, Rfl: 1   Multiple Vitamins-Minerals (PRESERVISION AREDS 2 PO), Take 1 tablet by mouth 2 (two) times daily. , Disp: , Rfl:    ramipril (ALTACE) 10 MG capsule, TAKE 1 CAPSULE BY MOUTH EVERY DAY, Disp: 90 capsule, Rfl: 0   valACYclovir (VALTREX) 500 MG tablet, Take 500 mg by mouth daily as needed (fever blister)., Disp: , Rfl:    vitamin B-12 (CYANOCOBALAMIN) 1000 MCG tablet, Take 1 tablet (1,000 mcg total) by mouth daily., Disp: , Rfl:    vitamin C (ASCORBIC ACID) 250 MG tablet, Take 500 mg by mouth daily., Disp: , Rfl:  VITAMIN D PO, Take 1,000 Units by mouth daily., Disp: , Rfl:    Assessment     ICD-10-CM   1. Paroxysmal atrial fibrillation  I48.0 EKG 12-Lead    2. Primary hypertension  I10     3. Mitral and aortic valve disease  I08.0 PCV ECHOCARDIOGRAM COMPLETE      CHA2DS2-VASc Score is 7.  Yearly risk of stroke: 10% (A, F, HTN, DM, TIA).  Score of 1=0.6; 2=2.2; 3=3.2; 4=4.8; 5=7.2; 6=9.8; 7=>9.8) -(CHF; HTN; vasc disease DM,  Female = 1; Age <65 =0; 65-74 = 1,  >75 =2; stroke/embolism= 2).   No orders of the defined types were placed in this encounter.  There are no discontinued medications.    Recommendations:   Dana Sandoval  is a 82 y.o. Caucasian  female with history of hyperlipidemia, controlled diabetes, HTN, hypothyroidism, and mild to moderate aortic stenosis and mild to moderate AI, mild to moderate mitral regurgitation, abdominal atherosclerosis and abdominal ectasia, TIA with visual disturbances in Mar 2023 with AF noted on Zio and presently on anticoagulation.  1. Paroxysmal atrial fibrillation Patient is maintaining sinus rhythm.  She continues to tolerate Eliquis without bleeding diathesis, she has not had any dark stools or bloody stools.  She does have chronic anemia but hemoglobin has remained stable.  She continues to have occasional episodes of PACs and PVCs (ectopics on auscultation) that are old.    - EKG 12-Lead  2. Primary hypertension Blood pressure is under excellent control on ramipril, renal function is normal, continue the same.  3. Mitral and aortic valve disease Patient has moderate mitral regurgitation, moderate AAS and AR, she needs repeat echocardiogram in a year and I will see her back then.  No endocarditis prophylaxis is indicated.  - PCV ECHOCARDIOGRAM COMPLETE; Future    Adrian Prows, MD, [facility] 10/11/2022, 10:24 AM Office: [phone]

## 2022-10-11 NOTE — Assessment & Plan Note (Signed)
Continue work on diet and exercise.  Continue atorvastatin. 

## 2022-11-04 ENCOUNTER — Other Ambulatory Visit: Payer: Self-pay | Admitting: Family Medicine

## 2022-11-08 NOTE — Progress Notes (Signed)
[facility] - Clinic Note  11/10/2022     CHIEF COMPLAINT Patient presents for Retina Follow Up  HISTORY OF PRESENT ILLNESS: Dana Sandoval is a 82 y.o. 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  Severity is moderate.  Duration of 5 weeks.  Since onset it is stable.  I, the attending physician,  performed the HPI with the patient and updated documentation appropriately.        Comments   Pt here for 5 wk ret f/u exu ARMD OD. Pt states [facility] the same, no changes noticed.      Last edited by Rennis Chris, MD on 11/10/2022  8:52 AM.      Referring physician: Joaquim Nam, MD [address] [place],  [place] [address]  HISTORICAL INFORMATION:  Selected notes from the MEDICAL RECORD NUMBER Referred by Dr. Laruth Bouchard for eval of ret heme OD   CURRENT MEDICATIONS: No current outpatient medications on file. (Ophthalmic Drugs)   No current facility-administered medications for this visit. (Ophthalmic Drugs)   Current Outpatient Medications (Other)  Medication Sig   Accu-Chek Softclix Lancets lancets Use to check blood sugar daily. Dx E11.9   apixaban (ELIQUIS) 5 MG TABS tablet TAKE 1 TABLET BY MOUTH TWICE (2) DAILY   atorvastatin (LIPITOR) 40 MG tablet TAKE 1 TABLET BY MOUTH ONCE A DAY AT 6PM   Blood Glucose Monitoring Suppl (ACCU-CHEK GUIDE) w/Device KIT Use to check blood sugar daily. Dx E11.9   glucose blood (ACCU-CHEK GUIDE) test strip USE TO CHECK BLOOD SUGAR DAILY   levothyroxine (SYNTHROID) 88 MCG tablet TAKE 1 TABLET BY MOUTH EVERY DAY   metFORMIN (GLUCOPHAGE) 500 MG tablet TAKE 1 TABLET BY MOUTH TWICE A DAY   Multiple Vitamins-Minerals (PRESERVISION AREDS 2 PO) Take 1 tablet by mouth 2 (two) times daily.    ramipril (ALTACE) 10 MG capsule TAKE 1 CAPSULE BY MOUTH EVERY DAY   valACYclovir (VALTREX) 500 MG tablet Take 500 mg by mouth daily as needed (fever blister).    vitamin B-12 (CYANOCOBALAMIN) 1000 MCG tablet Take 1 tablet (1,000 mcg total) by mouth daily.   vitamin C (ASCORBIC ACID) 250 MG tablet Take 500 mg by mouth daily.   VITAMIN D PO Take 1,000 Units by mouth daily.   No current facility-administered medications for this visit. (Other)   REVIEW OF SYSTEMS: ROS   Positive for: Endocrine, Cardiovascular, Eyes Negative for: Constitutional, Gastrointestinal, Neurological, Skin, Genitourinary, Musculoskeletal, HENT, Respiratory, Psychiatric, Allergic/Imm, Heme/Lymph Last edited by Thompson Grayer, COT on 11/10/2022  7:56 AM.        ALLERGIES Allergies  Allergen Reactions   Codeine Itching    After 3 days   Dilaudid [Hydromorphone Hcl] Itching    After 3 days   PAST MEDICAL HISTORY Past Medical History:  Diagnosis Date   Aortic stenosis    Arthritis    B12 deficiency    Diabetes mellitus    type 2   Heart murmur    aortic stenosis   Hyperlipidemia    Hypertension    Hypothyroidism    IBS (irritable bowel syndrome)    Macular degeneration    Macular degeneration of right eye    NSVD (normal spontaneous vaginal delivery)    X 4   Pain    INTRASCAPULAR PAIN   SAB (spontaneous abortion)  X 2   Thyroid disease    HYPOTHYROIDISM   Past Surgical History:  Procedure Laterality Date   ABDOMINAL HYSTERECTOMY  1977   TAH  (FIBROIDS)   CATARACT EXTRACTION Bilateral    DILATION AND CURETTAGE OF UTERUS     DOPPLER ECHOCARDIOGRAPHY  2009   HAD AN ECHOCARDIOGRAM THAT SHOWED MODERATE MITRAL  ANNULAR CALCIFICATION AND A NORMAL EJECTION FRACTION OF 55%   SHOULDER SURGERY     RIGHT SHOULDER SURGERY   THYROIDECTOMY     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   RIGHT   TOTAL KNEE ARTHROPLASTY Left 09/10/2018   Procedure: TOTAL KNEE ARTHROPLASTY;  Surgeon: Marcene Corning, MD;  Location: [facility];  Service: Orthopedics;  Laterality: Left;   FAMILY HISTORY Family History  Problem Relation Age of Onset   Osteoporosis Mother     Heart disease Mother    Macular degeneration Mother    Heart disease Father    Cancer Father        BRAIN TUMOR   Cancer Brother        BRAIN TUMOR   Diabetes Maternal Aunt    Diabetes Maternal Uncle    Stroke Paternal Grandfather    Colon cancer Neg Hx    Breast cancer Neg Hx    Migraines Neg Hx    SOCIAL HISTORY Social History   Tobacco Use   Smoking status: Never   Smokeless tobacco: Never  Vaping Use   Vaping Use: Never used  Substance Use Topics   Alcohol use: No    Alcohol/week: 0.0 standard drinks of alcohol   Drug use: No       OPHTHALMIC EXAM: Base Eye Exam     Visual Acuity (Snellen - Linear)       Right Left   Dist  20/40 -1 20/30   Dist ph  20/40 NI         Tonometry (Tonopen, 8:03 AM)       Right Left   Pressure 12 17         Pupils       Pupils Dark Light Shape React APD   Right PERRL 3 2 Round Brisk None   Left PERRL 3 2 Round Brisk None         Visual Fields (Counting fingers)       Left Right    Full Full         Extraocular Movement       Right Left    Full, Ortho Full, Ortho         Neuro/Psych     Oriented x3: Yes   Mood/Affect: Normal         Dilation     Both eyes: 1.0% Mydriacyl, 2.5% Phenylephrine @ 8:03 AM           Slit Lamp and Fundus Exam     Slit Lamp Exam       Right Left   Lids/Lashes Dermatochalasis - upper lid Dermatochalasis - upper lid   Conjunctiva/Sclera White and quiet White and quiet   Cornea 1+ Punctate epithelial erosions, fine endo pigment, trace tear film debris 1+ fine Punctate epithelial erosions, fine endo pigment, trace tear film debris   Anterior Chamber Deep and quiet Deep and quiet   Iris Round and dilated Round and dilated   Lens Toric PC IOL with marks at 1200 and 0600, 1+ Posterior capsular opacification nasal side approaching visual axis Toric PC IOL with marks at 1200 and 0600, 1+  Posterior capsular opacification   Anterior Vitreous Vitreous syneresis, trace  pigment, Posterior vitreous detachment, vitreous condensations Vitreous syneresis, Posterior vitreous detachment, vitreous condensations         Fundus Exam       Right Left   Disc mild Pallor, Sharp rim, +cupping, PPA mild Pallor, Sharp rim, +cupping, PPA, +SVP   C/D Ratio 0.7 0.7   Macula Blunted foveal reflex, central CNV with subretinal heme -- stably resolved -- now with pigment ring, RPE mottling, clumping and atrophy, Drusen, stable improvement in shallow SRF overlying CNV, cystic changes temporal mac -- improved Flat, Blunted foveal reflex, Drusen, RPE mottling, clumping and atrophy, focal IRH/SRH inferior to fovea -- stably improved, scattered patches of GA, no heme or fluid   Vessels attenuated, Tortuous mild attenuation, mild tortuosity   Periphery Attached, reticular degeneration, paving stone degeneration IT quad, No heme Attached, reticular degeneration, mild inferior paving stone degeneration, No heme           IMAGING AND PROCEDURES  Imaging and Procedures for 11/10/2022  OCT, Retina - OU - Both Eyes       Right Eye Quality was good. Central Foveal Thickness: 319. Progression has improved. Findings include no IRF, no SRF, abnormal foveal contour, retinal drusen , subretinal hyper-reflective material, pigment epithelial detachment, outer retinal atrophy (Stable improvement in SRF overlying PED; interval improvement in [facility] / IRF temporal fovea).   Left Eye Quality was good. Central Foveal Thickness: 224. Progression has been stable. Findings include normal foveal contour, no IRF, no SRF, retinal drusen , outer retinal atrophy (stable improvement in [facility] and edema inferior fovea and macula).   Notes *Images captured and stored on drive  Diagnosis / Impression:  Ex ARMD OU OD: Stable improvement in SRF overlying PED; interval improvement in [facility] / IRF temporal fovea OS: stable improvement in [facility] and edema inferior fovea and macula  Clinical management:  See  below  Abbreviations: NFP - Normal foveal profile. CME - cystoid macular edema. PED - pigment epithelial detachment. IRF - intraretinal fluid. SRF - subretinal fluid. EZ - ellipsoid zone. ERM - epiretinal membrane. ORA - outer retinal atrophy. ORT - outer retinal tubulation. SRHM - subretinal hyper-reflective material. IRHM - intraretinal hyper-reflective material      Intravitreal Injection, Pharmacologic Agent - OS - Left Eye       Time Out 11/10/2022. 8:40 AM. Confirmed correct patient, procedure, site, and patient consented.   Anesthesia Topical anesthesia was used. Anesthetic medications included Lidocaine 2%, Proparacaine 0.5%.   Procedure Preparation included 5% betadine to ocular surface, eyelid speculum. A (32g) needle was used.   Injection: 1.25 mg Bevacizumab 1.25mg /0.55ml   Route: Intravitreal, Site: Left Eye   NDC: 72536-644-03, Lot: [id number] , Expiration date: 12/08/2022   Post-op Post injection exam found visual acuity of at least counting fingers. The patient tolerated the procedure well. There were no complications. The patient received written and verbal post procedure care education.            ASSESSMENT/PLAN:   ICD-10-CM   1. Exudative age-related macular degeneration of right eye with active choroidal neovascularization (HCC)  H35.3211 OCT, Retina - OU - Both Eyes    2. Exudative age-related macular degeneration of left eye with active choroidal neovascularization (HCC)  H35.3221 OCT, Retina - OU - Both Eyes    Intravitreal Injection, Pharmacologic Agent - OS - Left Eye    Bevacizumab (AVASTIN) SOLN 1.25 mg    3. Essential hypertension  I10  4. Hypertensive retinopathy of both eyes  H35.033     5. Pseudophakia, both eyes  Z96.1     6. PCO (posterior capsule opacification), bilateral  H26.493      1. Exudative age related macular degeneration, OD  - s/p IVA OD #1 (05.06.22), #2 (06.03.22), #3 (07.01.22), #4 (07.29.22), #5 (08.26.22), #6  (09.23.22), #7 (10.21.22), #8 (11.18.22), #9 (12.16.22), #10 (01.20.23) -- IVA resistance  - s/p IVE OD #1 (02.24.23) -- sample, #2 (03.24.23), #3 (04.21.23), #4 (05.19.23), #5 (06.16.23), #6 (07.21.23), #7 (09.01.23), #8 (10.27.23), #9 (12.15.23), #10 (02.02.24), #11(03.29.24)  - OCT shows Stable improvement in SRF overlying PED; interval improvement in [facility] / IRF temporal fovea  - BCVA 20/40 OD -- stable  - will treat OD every other visit (~q10w) - IVA informed consent obtained and signed, 05.06.22 (OD) - IVE informed consent obtained and signed, 02.24.23 (OD)  - f/u 5-6 wks -- DFE/OCT, possible injection, tx and ext as able (extending OD to 10-1 weeks)  2. Exudative age related macular degeneration, left eye   - early conversion to exudative ARMD -- noted on 01.04.24  - s/p IVA OS #1 (01.04.24), #2 (02.02.24), #3 (03.01.24), #4 (03.29.24)  - pt reports new superior paracentral scotoma and +distortion OS -- improved  - BCVA OS 20/30 -- stable  - OCT shows stable improvement in [facility] and edema inferior fovea and macula at 5 wks - recommend IVA OS #5 today, 05.03.24 w/ f/u ext to 5-6 wks - pt wishes to be treated with IVA  - RBA of procedure discussed, questions answered - informed consent obtained and signed - see procedure note  - f/u in 5-6 wks, DFE, OCT, possible injxns  3,4. Hypertensive retinopathy OU - discussed importance of tight BP control -  monitor  5. Pseudophakia OU  - s/p CE/IOL  - IOL in good position, doing well  - monitor   6. PCO OU (OD > OS)  - monitor   Ophthalmic Meds Ordered this visit:  Meds ordered this encounter  Medications   Bevacizumab (AVASTIN) SOLN 1.25 mg     Return for f/u 5-6 weeks, exu ARMD OU, DFE, OCT.  There are no Patient Instructions on file for this visit.  This document serves as a record of services personally performed by Karie Chimera, MD, PhD. It was created on their behalf by Berlin Hun COT, an ophthalmic technician.  The creation of this record is the provider's dictation and/or activities during the visit.    Electronically signed by: Berlin Hun COT 05/01/20249:36 AM  This document serves as a record of services personally performed by Karie Chimera, MD, PhD. It was created on their behalf by Glee Arvin. Manson Passey, OA an ophthalmic technician. The creation of this record is the provider's dictation and/or activities during the visit.    Electronically signed by: Glee Arvin. Manson Passey, [place] 05.03.2024 9:36 AM  Karie Chimera, M.D., Ph.D. Diseases & Surgery of the Retina and Vitreous [facility] 11/10/2022   I have reviewed the above documentation for accuracy and completeness, and I agree with the above. Karie Chimera, M.D., Ph.D. 11/10/22 9:37 AM   Abbreviations: M myopia (nearsighted); A astigmatism; H hyperopia (farsighted); P presbyopia; Mrx spectacle prescription;  CTL contact lenses; OD right eye; OS left eye; OU both eyes  XT exotropia; ET esotropia; PEK punctate epithelial keratitis; PEE punctate epithelial erosions; DES dry eye syndrome; MGD meibomian gland dysfunction; ATs artificial tears; PFAT's preservative free artificial tears;  NSC nuclear sclerotic cataract; PSC posterior subcapsular cataract; ERM epi-retinal membrane; PVD posterior vitreous detachment; RD retinal detachment; DM diabetes mellitus; DR diabetic retinopathy; NPDR non-proliferative diabetic retinopathy; PDR proliferative diabetic retinopathy; CSME clinically significant macular edema; DME diabetic macular edema; dbh dot blot hemorrhages; CWS cotton wool spot; POAG primary open angle glaucoma; C/D cup-to-disc ratio; HVF humphrey visual field; GVF goldmann visual field; OCT optical coherence tomography; IOP intraocular pressure; BRVO Branch retinal vein occlusion; CRVO central retinal vein occlusion; CRAO central retinal artery occlusion; BRAO branch retinal artery occlusion; RT retinal tear; SB scleral buckle;  PPV pars plana vitrectomy; VH Vitreous hemorrhage; PRP panretinal laser photocoagulation; IVK intravitreal kenalog; VMT vitreomacular traction; MH Macular hole;  NVD neovascularization of the disc; NVE neovascularization elsewhere; AREDS age related eye disease study; ARMD age related macular degeneration; POAG primary open angle glaucoma; EBMD epithelial/anterior basement membrane dystrophy; ACIOL anterior chamber intraocular lens; IOL intraocular lens; PCIOL posterior chamber intraocular lens; Phaco/IOL phacoemulsification with intraocular lens placement; PRK photorefractive keratectomy; LASIK laser assisted in situ keratomileusis; HTN hypertension; DM diabetes mellitus; COPD chronic obstructive pulmonary disease

## 2022-11-10 ENCOUNTER — Ambulatory Visit (INDEPENDENT_AMBULATORY_CARE_PROVIDER_SITE_OTHER): Payer: HMO | Admitting: Ophthalmology

## 2022-11-10 ENCOUNTER — Encounter (INDEPENDENT_AMBULATORY_CARE_PROVIDER_SITE_OTHER): Payer: Self-pay | Admitting: Ophthalmology

## 2022-11-10 DIAGNOSIS — H35033 Hypertensive retinopathy, bilateral: Secondary | ICD-10-CM | POA: Diagnosis not present

## 2022-11-10 DIAGNOSIS — H353211 Exudative age-related macular degeneration, right eye, with active choroidal neovascularization: Secondary | ICD-10-CM

## 2022-11-10 DIAGNOSIS — H353231 Exudative age-related macular degeneration, bilateral, with active choroidal neovascularization: Secondary | ICD-10-CM | POA: Diagnosis not present

## 2022-11-10 DIAGNOSIS — Z961 Presence of intraocular lens: Secondary | ICD-10-CM | POA: Diagnosis not present

## 2022-11-10 DIAGNOSIS — I10 Essential (primary) hypertension: Secondary | ICD-10-CM

## 2022-11-10 DIAGNOSIS — H26493 Other secondary cataract, bilateral: Secondary | ICD-10-CM

## 2022-11-10 DIAGNOSIS — H353221 Exudative age-related macular degeneration, left eye, with active choroidal neovascularization: Secondary | ICD-10-CM

## 2022-11-10 MED ORDER — BEVACIZUMAB CHEMO INJECTION 1.25MG/0.05ML SYRINGE FOR KALEIDOSCOPE
1.2500 mg | INTRAVITREAL | Status: AC | PRN
Start: 2022-11-10 — End: 2022-11-10
  Administered 2022-11-10: 1.25 mg via INTRAVITREAL

## 2022-12-05 NOTE — Progress Notes (Signed)
[facility] - Clinic Note  12/14/2022     CHIEF COMPLAINT Patient presents for Retina Follow Up  HISTORY OF PRESENT ILLNESS: Dana Sandoval is a 82 y.o. 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  Severity is moderate.  Duration of 5 weeks.  Since onset it is stable.  I, the attending physician,  performed the HPI with the patient and updated documentation appropriately.        Comments   Pt here for 5 wk ret f/u exu ARMD OD. Pt states VA is the same.        Last edited by Rennis Chris, MD on 12/14/2022  4:26 PM.     Pt states vision is stable  Referring physician: Joaquim Nam, MD [address] [place],  [place] [address]  HISTORICAL INFORMATION:  Selected notes from the MEDICAL RECORD NUMBER Referred by Dr. Laruth Bouchard for eval of ret heme OD   CURRENT MEDICATIONS: No current outpatient medications on file. (Ophthalmic Drugs)   No current facility-administered medications for this visit. (Ophthalmic Drugs)   Current Outpatient Medications (Other)  Medication Sig   Accu-Chek Softclix Lancets lancets Use to check blood sugar daily. Dx E11.9   apixaban (ELIQUIS) 5 MG TABS tablet TAKE 1 TABLET BY MOUTH TWICE (2) DAILY   atorvastatin (LIPITOR) 40 MG tablet TAKE 1 TABLET BY MOUTH ONCE A DAY AT 6PM   Blood Glucose Monitoring Suppl (ACCU-CHEK GUIDE) w/Device KIT Use to check blood sugar daily. Dx E11.9   glucose blood (ACCU-CHEK GUIDE) test strip USE TO CHECK BLOOD SUGAR DAILY   levothyroxine (SYNTHROID) 88 MCG tablet TAKE 1 TABLET BY MOUTH EVERY DAY   metFORMIN (GLUCOPHAGE) 500 MG tablet TAKE 1 TABLET BY MOUTH TWICE A DAY   Multiple Vitamins-Minerals (PRESERVISION AREDS 2 PO) Take 1 tablet by mouth 2 (two) times daily.    ramipril (ALTACE) 10 MG capsule TAKE 1 CAPSULE BY MOUTH EVERY DAY   valACYclovir (VALTREX) 500 MG tablet Take 500 mg by mouth daily as needed  (fever blister).   vitamin B-12 (CYANOCOBALAMIN) 1000 MCG tablet Take 1 tablet (1,000 mcg total) by mouth daily.   vitamin C (ASCORBIC ACID) 250 MG tablet Take 500 mg by mouth daily.   VITAMIN D PO Take 1,000 Units by mouth daily.   No current facility-administered medications for this visit. (Other)   REVIEW OF SYSTEMS: ROS   Positive for: Endocrine, Cardiovascular, Eyes Negative for: Constitutional, Gastrointestinal, Neurological, Skin, Genitourinary, Musculoskeletal, HENT, Respiratory, Psychiatric, Allergic/Imm, Heme/Lymph Last edited by Thompson Grayer, COT on 12/14/2022 12:57 PM.     ALLERGIES Allergies  Allergen Reactions   Codeine Itching    After 3 days   Dilaudid [Hydromorphone Hcl] Itching    After 3 days   PAST MEDICAL HISTORY Past Medical History:  Diagnosis Date   Aortic stenosis    Arthritis    B12 deficiency    Diabetes mellitus    type 2   Heart murmur    aortic stenosis   Hyperlipidemia    Hypertension    Hypothyroidism    IBS (irritable bowel syndrome)    Macular degeneration    Macular degeneration of right eye    NSVD (normal spontaneous vaginal delivery)    X 4   Pain    INTRASCAPULAR PAIN   SAB (spontaneous abortion)  X 2   Thyroid disease    HYPOTHYROIDISM   Past Surgical History:  Procedure Laterality Date   ABDOMINAL HYSTERECTOMY  1977   TAH  (FIBROIDS)   CATARACT EXTRACTION Bilateral    DILATION AND CURETTAGE OF UTERUS     DOPPLER ECHOCARDIOGRAPHY  2009   HAD AN ECHOCARDIOGRAM THAT SHOWED MODERATE MITRAL  ANNULAR CALCIFICATION AND A NORMAL EJECTION FRACTION OF 55%   SHOULDER SURGERY     RIGHT SHOULDER SURGERY   THYROIDECTOMY     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   RIGHT   TOTAL KNEE ARTHROPLASTY Left 09/10/2018   Procedure: TOTAL KNEE ARTHROPLASTY;  Surgeon: Marcene Corning, MD;  Location: [facility];  Service: Orthopedics;  Laterality: Left;   FAMILY HISTORY Family History  Problem Relation Age of Onset   Osteoporosis  Mother    Heart disease Mother    Macular degeneration Mother    Heart disease Father    Cancer Father        BRAIN TUMOR   Cancer Brother        BRAIN TUMOR   Diabetes Maternal Aunt    Diabetes Maternal Uncle    Stroke Paternal Grandfather    Colon cancer Neg Hx    Breast cancer Neg Hx    Migraines Neg Hx    SOCIAL HISTORY Social History   Tobacco Use   Smoking status: Never   Smokeless tobacco: Never  Vaping Use   Vaping Use: Never used  Substance Use Topics   Alcohol use: No    Alcohol/week: 0.0 standard drinks of alcohol   Drug use: No       OPHTHALMIC EXAM: Base Eye Exam     Visual Acuity (Snellen - Linear)       Right Left   Dist [place] 20/50 20/30 +1   Dist ph  20/40 -1 NI         Tonometry (Tonopen, 1:04 PM)       Right Left   Pressure 12 14         Pupils       Pupils Dark Light Shape React APD   Right PERRL 3 2 Round Brisk None   Left PERRL 3 2 Round Brisk None         Visual Fields (Counting fingers)       Left Right    Full Full         Extraocular Movement       Right Left    Full, Ortho Full, Ortho         Neuro/Psych     Oriented x3: Yes   Mood/Affect: Normal         Dilation     Both eyes: 1.0% Mydriacyl, 2.5% Phenylephrine @ 1:05 PM           Slit Lamp and Fundus Exam     Slit Lamp Exam       Right Left   Lids/Lashes Dermatochalasis - upper lid Dermatochalasis - upper lid   Conjunctiva/Sclera White and quiet White and quiet   Cornea 1+ Punctate epithelial erosions, fine endo pigment, trace tear film debris 1+ fine Punctate epithelial erosions, fine endo pigment, trace tear film debris   Anterior Chamber Deep and quiet Deep and quiet   Iris Round and dilated Round and dilated   Lens Toric PC IOL with marks at 1200 and 0600, 1+ Posterior capsular opacification nasal side approaching visual axis Toric PC IOL with marks at 1200 and 0600,  1+ Posterior capsular opacification   Anterior Vitreous Vitreous  syneresis, trace pigment, Posterior vitreous detachment, vitreous condensations Vitreous syneresis, Posterior vitreous detachment, vitreous condensations         Fundus Exam       Right Left   Disc mild Pallor, Sharp rim, +cupping, PPA mild Pallor, Sharp rim, +cupping, PPA   C/D Ratio 0.7 0.7   Macula Blunted foveal reflex, central CNV with subretinal heme -- stably resolved -- now with pigment ring, RPE mottling, clumping and atrophy, Drusen, stable improvement in shallow SRF overlying CNV, cystic changes temporal mac -- slightly increased Flat, Blunted foveal reflex, Drusen, RPE mottling, clumping and atrophy, focal IRH/SRH inferior to fovea -- stably improved, scattered patches of GA, no heme or fluid   Vessels attenuated, Tortuous mild attenuation, mild tortuosity   Periphery Attached, reticular degeneration, paving stone degeneration IT quad, No heme Attached, reticular degeneration, mild inferior paving stone degeneration, No heme           IMAGING AND PROCEDURES  Imaging and Procedures for 12/14/2022  OCT, Retina - OU - Both Eyes       Right Eye Quality was good. Central Foveal Thickness: 368. Progression has worsened. Findings include no SRF, abnormal foveal contour, retinal drusen , subretinal hyper-reflective material, intraretinal fluid, pigment epithelial detachment, outer retinal atrophy (Stable improvement in SRF overlying PED; interval increase in  [facility] / IRF temporal fovea).   Left Eye Quality was good. Central Foveal Thickness: 224. Progression has been stable. Findings include normal foveal contour, no IRF, no SRF, retinal drusen , outer retinal atrophy (stable improvement in [facility] and edema inferior fovea and macula).   Notes *Images captured and stored on drive  Diagnosis / Impression:  Ex ARMD OU OD: Stable improvement in SRF overlying PED; interval increase in [facility] / IRF temporal fovea OS: stable improvement in [facility] and edema inferior fovea and  macula  Clinical management:  See below  Abbreviations: NFP - Normal foveal profile. CME - cystoid macular edema. PED - pigment epithelial detachment. IRF - intraretinal fluid. SRF - subretinal fluid. EZ - ellipsoid zone. ERM - epiretinal membrane. ORA - outer retinal atrophy. ORT - outer retinal tubulation. SRHM - subretinal hyper-reflective material. IRHM - intraretinal hyper-reflective material      Intravitreal Injection, Pharmacologic Agent - OD - Right Eye       Time Out 12/14/2022. 1:32 PM. Confirmed correct patient, procedure, site, and patient consented.   Anesthesia Topical anesthesia was used. Anesthetic medications included Lidocaine 2%, Proparacaine 0.5%.   Procedure Preparation included 5% betadine to ocular surface, eyelid speculum. A (32g) needle was used.   Injection: 2 mg aflibercept 2 MG/0.05ML   Route: Intravitreal, Site: Right Eye   NDC: L6038910, Lot: [id number], Expiration date: 11/07/2023, Waste: 0 mL   Post-op Post injection exam found visual acuity of at least counting fingers. The patient tolerated the procedure well. There were no complications. The patient received written and verbal post procedure care education. Post injection medications were not given.      Intravitreal Injection, Pharmacologic Agent - OS - Left Eye       Time Out 12/14/2022. 1:33 PM. Confirmed correct patient, procedure, site, and patient consented.   Anesthesia Topical anesthesia was used. Anesthetic medications included Lidocaine 2%, Proparacaine 0.5%.   Procedure Preparation included 5% betadine to ocular surface, eyelid speculum. A (33g) needle was used.   Injection: 1.25 mg Bevacizumab 1.25mg /0.90ml   Route: Intravitreal, Site: Left Eye   NDC: P3213405, Lot: [id number], Expiration  date: 05/02/2023   Post-op Post injection exam found visual acuity of at least counting fingers. The patient tolerated the procedure well. There were no complications. The patient  received written and verbal post procedure care education.            ASSESSMENT/PLAN:   ICD-10-CM   1. Exudative age-related macular degeneration of right eye with active choroidal neovascularization (HCC)  H35.3211 OCT, Retina - OU - Both Eyes    Intravitreal Injection, Pharmacologic Agent - OD - Right Eye    aflibercept (EYLEA) SOLN 2 mg    2. Exudative age-related macular degeneration of left eye with active choroidal neovascularization (HCC)  H35.3221 Intravitreal Injection, Pharmacologic Agent - OS - Left Eye    Bevacizumab (AVASTIN) SOLN 1.25 mg    3. Essential hypertension  I10     4. Hypertensive retinopathy of both eyes  H35.033     5. Pseudophakia, both eyes  Z96.1     6. PCO (posterior capsule opacification), bilateral  H26.493      1. Exudative age related macular degeneration, OD  - s/p IVA OD #1 (05.06.22), #2 (06.03.22), #3 (07.01.22), #4 (07.29.22), #5 (08.26.22), #6 (09.23.22), #7 (10.21.22), #8 (11.18.22), #9 (12.16.22), #10 (01.20.23) -- IVA resistance  - s/p IVE OD #1 (02.24.23) -- sample, #2 (03.24.23), #3 (04.21.23), #4 (05.19.23), #5 (06.16.23), #6 (07.21.23), #7 (09.01.23), #8 (10.27.23), #9 (12.15.23), #10 (02.02.24), #11 (03.29.24)  - OCT shows Stable improvement in SRF overlying PED; interval increase in [facility] / IRF temporal fovea at 10 weeks since last injxn  - BCVA 20/40 OD -- stable  - recommend IVE OD #12 today, 06.06.24 w/ f/u in 7 wks  - RBA of procedure discussed, questions answered - see procedure note - IVA informed consent obtained and signed, 05.06.22 (OD) - IVE informed consent obtained and signed, 02.24.23 (OD)  - f/u 7 wks -- DFE/OCT, possible injection, tx and ext as able but now merging schedule with OS  2. Exudative age related macular degeneration, left eye   - early conversion to exudative ARMD -- noted on 01.04.24  - s/p IVA OS #1 (01.04.24), #2 (02.02.24), #3 (03.01.24), #4 (03.29.24), #5 (05.03.24)  - pt reports new superior  paracentral scotoma and +distortion OS -- improved  - BCVA OS 20/30 -- stable  - OCT shows stable improvement in [facility] and edema inferior fovea and macula at 4.9 wks - recommend IVA OS #6 today, 06.06.24 w/ f/u ext to 7 wks - pt wishes to be treated with IVA  - RBA of procedure discussed, questions answered - informed consent obtained and signed - see procedure note  - f/u in 7 wks, DFE, OCT, possible injxns  3,4. Hypertensive retinopathy OU - discussed importance of tight BP control -  monitor  5. Pseudophakia OU  - s/p CE/IOL  - IOL in good position, doing well  - monitor   6. PCO OU (OD > OS)  - monitor   Ophthalmic Meds Ordered this visit:  Meds ordered this encounter  Medications   Bevacizumab (AVASTIN) SOLN 1.25 mg   aflibercept (EYLEA) SOLN 2 mg     Return in about 7 weeks (around 02/01/2023) for f/u exu ARMD OU, DFE, OCT.  There are no Patient Instructions on file for this visit.  This document serves as a record of services personally performed by Karie Chimera, MD, PhD. It was created on their behalf by Glee Arvin. Manson Passey, OA an ophthalmic technician. The creation of this record is the provider's  dictation and/or activities during the visit.    Electronically signed by: Glee Arvin. Manson Passey, [place] 05.28.2024 4:28 PM   Karie Chimera, M.D., Ph.D. Diseases & Surgery of the Retina and Vitreous [facility] 12/14/2022   I have reviewed the above documentation for accuracy and completeness, and I agree with the above. Karie Chimera, M.D., Ph.D. 12/14/22 4:29 PM   Abbreviations: M myopia (nearsighted); A astigmatism; H hyperopia (farsighted); P presbyopia; Mrx spectacle prescription;  CTL contact lenses; OD right eye; OS left eye; OU both eyes  XT exotropia; ET esotropia; PEK punctate epithelial keratitis; PEE punctate epithelial erosions; DES dry eye syndrome; MGD meibomian gland dysfunction; ATs artificial tears; PFAT's preservative free artificial  tears; NSC nuclear sclerotic cataract; PSC posterior subcapsular cataract; ERM epi-retinal membrane; PVD posterior vitreous detachment; RD retinal detachment; DM diabetes mellitus; DR diabetic retinopathy; NPDR non-proliferative diabetic retinopathy; PDR proliferative diabetic retinopathy; CSME clinically significant macular edema; DME diabetic macular edema; dbh dot blot hemorrhages; CWS cotton wool spot; POAG primary open angle glaucoma; C/D cup-to-disc ratio; HVF humphrey visual field; GVF goldmann visual field; OCT optical coherence tomography; IOP intraocular pressure; BRVO Branch retinal vein occlusion; CRVO central retinal vein occlusion; CRAO central retinal artery occlusion; BRAO branch retinal artery occlusion; RT retinal tear; SB scleral buckle; PPV pars plana vitrectomy; VH Vitreous hemorrhage; PRP panretinal laser photocoagulation; IVK intravitreal kenalog; VMT vitreomacular traction; MH Macular hole;  NVD neovascularization of the disc; NVE neovascularization elsewhere; AREDS age related eye disease study; ARMD age related macular degeneration; POAG primary open angle glaucoma; EBMD epithelial/anterior basement membrane dystrophy; ACIOL anterior chamber intraocular lens; IOL intraocular lens; PCIOL posterior chamber intraocular lens; Phaco/IOL phacoemulsification with intraocular lens placement; PRK photorefractive keratectomy; LASIK laser assisted in situ keratomileusis; HTN hypertension; DM diabetes mellitus; COPD chronic obstructive pulmonary disease

## 2022-12-14 ENCOUNTER — Ambulatory Visit (INDEPENDENT_AMBULATORY_CARE_PROVIDER_SITE_OTHER): Payer: HMO | Admitting: Ophthalmology

## 2022-12-14 ENCOUNTER — Encounter (INDEPENDENT_AMBULATORY_CARE_PROVIDER_SITE_OTHER): Payer: Self-pay | Admitting: Ophthalmology

## 2022-12-14 DIAGNOSIS — H353221 Exudative age-related macular degeneration, left eye, with active choroidal neovascularization: Secondary | ICD-10-CM

## 2022-12-14 DIAGNOSIS — Z961 Presence of intraocular lens: Secondary | ICD-10-CM | POA: Diagnosis not present

## 2022-12-14 DIAGNOSIS — I10 Essential (primary) hypertension: Secondary | ICD-10-CM

## 2022-12-14 DIAGNOSIS — H35033 Hypertensive retinopathy, bilateral: Secondary | ICD-10-CM | POA: Diagnosis not present

## 2022-12-14 DIAGNOSIS — H353211 Exudative age-related macular degeneration, right eye, with active choroidal neovascularization: Secondary | ICD-10-CM

## 2022-12-14 DIAGNOSIS — H353231 Exudative age-related macular degeneration, bilateral, with active choroidal neovascularization: Secondary | ICD-10-CM | POA: Diagnosis not present

## 2022-12-14 DIAGNOSIS — H26493 Other secondary cataract, bilateral: Secondary | ICD-10-CM

## 2022-12-14 MED ORDER — AFLIBERCEPT 2MG/0.05ML IZ SOLN FOR KALEIDOSCOPE
2.0000 mg | INTRAVITREAL | Status: AC | PRN
Start: 2022-12-14 — End: 2022-12-14
  Administered 2022-12-14: 2 mg via INTRAVITREAL

## 2022-12-14 MED ORDER — BEVACIZUMAB CHEMO INJECTION 1.25MG/0.05ML SYRINGE FOR KALEIDOSCOPE
1.2500 mg | INTRAVITREAL | Status: AC | PRN
Start: 2022-12-14 — End: 2022-12-14
  Administered 2022-12-14: 1.25 mg via INTRAVITREAL

## 2022-12-15 ENCOUNTER — Encounter (INDEPENDENT_AMBULATORY_CARE_PROVIDER_SITE_OTHER): Payer: HMO | Admitting: Ophthalmology

## 2022-12-28 ENCOUNTER — Other Ambulatory Visit: Payer: Self-pay | Admitting: Family Medicine

## 2022-12-28 DIAGNOSIS — E119 Type 2 diabetes mellitus without complications: Secondary | ICD-10-CM

## 2022-12-29 ENCOUNTER — Other Ambulatory Visit: Payer: Self-pay | Admitting: Family Medicine

## 2023-01-07 ENCOUNTER — Other Ambulatory Visit: Payer: Self-pay | Admitting: Family Medicine

## 2023-01-07 DIAGNOSIS — E785 Hyperlipidemia, unspecified: Secondary | ICD-10-CM

## 2023-01-07 DIAGNOSIS — E119 Type 2 diabetes mellitus without complications: Secondary | ICD-10-CM

## 2023-02-01 NOTE — Progress Notes (Signed)
[facility] - Clinic Note  02/02/2023     CHIEF COMPLAINT Patient presents for Retina Follow Up  HISTORY OF PRESENT ILLNESS: Dana Sandoval is a 82 y.o. 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  Severity is moderate.  Duration of 7 weeks.  Since onset it is stable.  I, the attending physician,  performed the HPI with the patient and updated documentation appropriately.        Comments   Pt here for 7 wk ret f/u exu ARMD OD. Pt states no VA changes.       Last edited by Rennis Chris, MD on 02/02/2023 11:22 AM.    Pt states vision is stable  Referring physician: Joaquim Nam, MD [address] [place],  [place] [address]  HISTORICAL INFORMATION:  Selected notes from the MEDICAL RECORD NUMBER Referred by Dr. Laruth Bouchard for eval of ret heme OD   CURRENT MEDICATIONS: No current outpatient medications on file. (Ophthalmic Drugs)   No current facility-administered medications for this visit. (Ophthalmic Drugs)   Current Outpatient Medications (Other)  Medication Sig   Accu-Chek Softclix Lancets lancets Use to check blood sugar daily. Dx E11.9   apixaban (ELIQUIS) 5 MG TABS tablet TAKE 1 TABLET BY MOUTH TWICE (2) DAILY   atorvastatin (LIPITOR) 40 MG tablet TAKE 1 TABLET BY MOUTH ONCE A DAY AT 6PM   Blood Glucose Monitoring Suppl (ACCU-CHEK GUIDE) w/Device KIT Use to check blood sugar daily. Dx E11.9   glucose blood (ACCU-CHEK GUIDE) test strip USE TO CHECK BLOOD SUGAR DAILY   levothyroxine (SYNTHROID) 88 MCG tablet TAKE 1 TABLET BY MOUTH EVERY DAY   metFORMIN (GLUCOPHAGE) 500 MG tablet TAKE 1 TABLET BY MOUTH TWICE A DAY   Multiple Vitamins-Minerals (PRESERVISION AREDS 2 PO) Take 1 tablet by mouth 2 (two) times daily.    ramipril (ALTACE) 10 MG capsule TAKE 1 CAPSULE BY MOUTH EVERY DAY   valACYclovir (VALTREX) 500 MG tablet Take 500 mg by mouth daily as needed (fever  blister).   vitamin B-12 (CYANOCOBALAMIN) 1000 MCG tablet Take 1 tablet (1,000 mcg total) by mouth daily.   vitamin C (ASCORBIC ACID) 250 MG tablet Take 500 mg by mouth daily.   VITAMIN D PO Take 1,000 Units by mouth daily.   No current facility-administered medications for this visit. (Other)   REVIEW OF SYSTEMS: ROS   Positive for: Endocrine, Cardiovascular, Eyes Negative for: Constitutional, Gastrointestinal, Neurological, Skin, Genitourinary, Musculoskeletal, HENT, Respiratory, Psychiatric, Allergic/Imm, Heme/Lymph Last edited by Thompson Grayer, COT on 02/02/2023  7:51 AM.      ALLERGIES Allergies  Allergen Reactions   Codeine Itching    After 3 days   Dilaudid [Hydromorphone Hcl] Itching    After 3 days   PAST MEDICAL HISTORY Past Medical History:  Diagnosis Date   Aortic stenosis    Arthritis    B12 deficiency    Diabetes mellitus    type 2   Heart murmur    aortic stenosis   Hyperlipidemia    Hypertension    Hypothyroidism    IBS (irritable bowel syndrome)    Macular degeneration    Macular degeneration of right eye    NSVD (normal spontaneous vaginal delivery)    X 4   Pain    INTRASCAPULAR PAIN   SAB (spontaneous abortion)    X  2   Thyroid disease    HYPOTHYROIDISM   Past Surgical History:  Procedure Laterality Date   ABDOMINAL HYSTERECTOMY  1977   TAH  (FIBROIDS)   CATARACT EXTRACTION Bilateral    DILATION AND CURETTAGE OF UTERUS     DOPPLER ECHOCARDIOGRAPHY  2009   HAD AN ECHOCARDIOGRAM THAT SHOWED MODERATE MITRAL  ANNULAR CALCIFICATION AND A NORMAL EJECTION FRACTION OF 55%   SHOULDER SURGERY     RIGHT SHOULDER SURGERY   THYROIDECTOMY     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   RIGHT   TOTAL KNEE ARTHROPLASTY Left 09/10/2018   Procedure: TOTAL KNEE ARTHROPLASTY;  Surgeon: Marcene Corning, MD;  Location: [facility];  Service: Orthopedics;  Laterality: Left;   FAMILY HISTORY Family History  Problem Relation Age of Onset   Osteoporosis Mother     Heart disease Mother    Macular degeneration Mother    Heart disease Father    Cancer Father        BRAIN TUMOR   Cancer Brother        BRAIN TUMOR   Diabetes Maternal Aunt    Diabetes Maternal Uncle    Stroke Paternal Grandfather    Colon cancer Neg Hx    Breast cancer Neg Hx    Migraines Neg Hx    SOCIAL HISTORY Social History   Tobacco Use   Smoking status: Never   Smokeless tobacco: Never  Vaping Use   Vaping status: Never Used  Substance Use Topics   Alcohol use: No    Alcohol/week: 0.0 standard drinks of alcohol   Drug use: No       OPHTHALMIC EXAM: Base Eye Exam     Visual Acuity (Snellen - Linear)       Right Left   Dist [place] 20/40 -2 20/30 -2   Dist ph [place] NI NI         Tonometry (Tonopen, 7:58 AM)       Right Left   Pressure 12 17         Pupils       Pupils Dark Light Shape React APD   Right PERRL 3 2 Round Brisk None   Left PERRL 3 2 Round Brisk None         Visual Fields (Counting fingers)       Left Right    Full Full         Extraocular Movement       Right Left    Full, Ortho Full, Ortho         Neuro/Psych     Oriented x3: Yes   Mood/Affect: Normal         Dilation     Both eyes: 1.0% Mydriacyl, 2.5% Phenylephrine @ 7:59 AM           Slit Lamp and Fundus Exam     Slit Lamp Exam       Right Left   Lids/Lashes Dermatochalasis - upper lid Dermatochalasis - upper lid   Conjunctiva/Sclera White and quiet White and quiet   Cornea 1+ Punctate epithelial erosions, fine endo pigment, trace tear film debris 1+ fine Punctate epithelial erosions, fine endo pigment, trace tear film debris   Anterior Chamber Deep and quiet Deep and quiet   Iris Round and dilated Round and dilated   Lens Toric PC IOL with marks at 1200 and 0600, 1+ Posterior capsular opacification nasal side approaching visual axis Toric PC IOL with marks at 1200 and 0600, 1+  Posterior capsular opacification   Anterior Vitreous Vitreous syneresis,  trace pigment, Posterior vitreous detachment, vitreous condensations Vitreous syneresis, Posterior vitreous detachment, vitreous condensations         Fundus Exam       Right Left   Disc mild Pallor, Sharp rim, +cupping, PPA mild Pallor, Sharp rim, +cupping, PPA   C/D Ratio 0.7 0.7   Macula Blunted foveal reflex, central CNV with subretinal heme -- stably resolved -- now with pigment ring, RPE mottling, clumping and atrophy, Drusen, stable improvement in shallow SRF overlying CNV, cystic changes temporal mac Flat, Blunted foveal reflex, Drusen, RPE mottling, clumping and atrophy, focal IRH/SRH inferior to fovea -- stably improved, scattered patches of GA, no heme or fluid   Vessels attenuated, mild tortuosity attenuated, mild tortuosity   Periphery Attached, reticular degeneration, paving stone degeneration IT quad, No heme Attached, reticular degeneration, mild inferior paving stone degeneration, No heme           IMAGING AND PROCEDURES  Imaging and Procedures for 02/02/2023  OCT, Retina - OU - Both Eyes       Right Eye Quality was good. Central Foveal Thickness: 339. Progression has been stable. Findings include no SRF, abnormal foveal contour, retinal drusen , subretinal hyper-reflective material, intraretinal fluid, pigment epithelial detachment, outer retinal atrophy (Stable improvement in SRF overlying PED; persistent SRHM / IRF temporal fovea).   Left Eye Quality was good. Central Foveal Thickness: 222. Progression has been stable. Findings include normal foveal contour, no IRF, no SRF, retinal drusen , outer retinal atrophy (stable improvement in [facility] and edema inferior fovea and macula).   Notes *Images captured and stored on drive  Diagnosis / Impression:  Ex ARMD OU OD: Stable improvement in SRF overlying PED; persistent SRHM / IRF temporal fovea OS: stable improvement in [facility] and edema inferior fovea and macula  Clinical management:  See below  Abbreviations: NFP  - Normal foveal profile. CME - cystoid macular edema. PED - pigment epithelial detachment. IRF - intraretinal fluid. SRF - subretinal fluid. EZ - ellipsoid zone. ERM - epiretinal membrane. ORA - outer retinal atrophy. ORT - outer retinal tubulation. SRHM - subretinal hyper-reflective material. IRHM - intraretinal hyper-reflective material      Intravitreal Injection, Pharmacologic Agent - OD - Right Eye       Time Out 02/02/2023. 8:44 AM. Confirmed correct patient, procedure, site, and patient consented.   Anesthesia Topical anesthesia was used. Anesthetic medications included Lidocaine 2%, Proparacaine 0.5%.   Procedure Preparation included 5% betadine to ocular surface, eyelid speculum. A (32g) needle was used.   Injection: 2 mg aflibercept 2 MG/0.05ML   Route: Intravitreal, Site: Right Eye   NDC: L6038910, Lot: [id number], Expiration date: 03/09/2024, Waste: 0 mL   Post-op Post injection exam found visual acuity of at least counting fingers. The patient tolerated the procedure well. There were no complications. The patient received written and verbal post procedure care education. Post injection medications were not given.      Intravitreal Injection, Pharmacologic Agent - OS - Left Eye       Time Out 02/02/2023. 8:45 AM. Confirmed correct patient, procedure, site, and patient consented.   Anesthesia Topical anesthesia was used. Anesthetic medications included Lidocaine 2%, Proparacaine 0.5%.   Procedure Preparation included 5% betadine to ocular surface, eyelid speculum. A (33g) needle was used.   Injection: 1.25 mg Bevacizumab 1.25mg /0.68ml   Route: Intravitreal, Site: Left Eye   NDC: P3213405, Lot: [id number], Expiration date: 04/16/2023   Post-op Post injection  exam found visual acuity of at least counting fingers. The patient tolerated the procedure well. There were no complications. The patient received written and verbal post procedure care education.             ASSESSMENT/PLAN:   ICD-10-CM   1. Exudative age-related macular degeneration of right eye with active choroidal neovascularization (HCC)  H35.3211 OCT, Retina - OU - Both Eyes    Intravitreal Injection, Pharmacologic Agent - OD - Right Eye    aflibercept (EYLEA) SOLN 2 mg    2. Exudative age-related macular degeneration of left eye with active choroidal neovascularization (HCC)  H35.3221 Intravitreal Injection, Pharmacologic Agent - OS - Left Eye    Bevacizumab (AVASTIN) SOLN 1.25 mg    3. Essential hypertension  I10     4. Hypertensive retinopathy of both eyes  H35.033     5. Pseudophakia, both eyes  Z96.1     6. PCO (posterior capsule opacification), bilateral  H26.493      1. Exudative age related macular degeneration, OD  - s/p IVA OD #1 (05.06.22), #2 (06.03.22), #3 (07.01.22), #4 (07.29.22), #5 (08.26.22), #6 (09.23.22), #7 (10.21.22), #8 (11.18.22), #9 (12.16.22), #10 (01.20.23) -- IVA resistance  - s/p IVE OD #1 (02.24.23) -- sample, #2 (03.24.23), #3 (04.21.23), #4 (05.19.23), #5 (06.16.23), #6 (07.21.23), #7 (09.01.23), #8 (10.27.23), #9 (12.15.23), #10 (02.02.24), #11 (03.29.24), #12 (06.06.24)  - OCT shows OD: Stable improvement in SRF overlying PED; persistent SRHM / IRF temporal fovea at 7 weeks  - BCVA 20/40 OD -- stable  - recommend IVE OD #13 today, 07.26.24 w/ f/u in 7 wks  - RBA of procedure discussed, questions answered - see procedure note - IVE informed consent obtained and signed, 02.24.23 (OD)  - f/u 7 wks -- DFE/OCT, possible injection, tx and ext as able but now merging schedule with OS  2. Exudative age related macular degeneration, left eye   - early conversion to exudative ARMD -- noted on 01.04.24  - s/p IVA OS #1 (01.04.24), #2 (02.02.24), #3 (03.01.24), #4 (03.29.24), #5 (05.03.24), #6 (06.06.24)  - pt reported new superior paracentral scotoma and +distortion OS -- improved  - BCVA OS 20/30 -- stable  - OCT shows stable improvement in [facility]  and edema inferior fovea and macula at 7 wks - recommend IVA OS #7 today, 07.26.24 w/ f/u in 7 wks again - pt wishes to be treated with IVA  - RBA of procedure discussed, questions answered - informed consent obtained and signed - see procedure note  - f/u in 7 wks, DFE, OCT, possible injxns  3,4. Hypertensive retinopathy OU - discussed importance of tight BP control -  monitor  5. Pseudophakia OU  - s/p CE/IOL  - IOL in good position, doing well  - monitor   6. PCO OU (OD > OS)  - monitor   Ophthalmic Meds Ordered this visit:  Meds ordered this encounter  Medications   Bevacizumab (AVASTIN) SOLN 1.25 mg   aflibercept (EYLEA) SOLN 2 mg     Return in about 7 weeks (around 03/23/2023) for f/u exu ARMD OU, DFE, OCT.  There are no Patient Instructions on file for this visit.  This document serves as a record of services personally performed by Karie Chimera, MD, PhD. It was created on their behalf by Berlin Hun COT, an ophthalmic technician. The creation of this record is the provider's dictation and/or activities during the visit.    Electronically signed by: Berlin Hun COT 07.25.24 11:23  AM  This document serves as a record of services personally performed by Karie Chimera, MD, PhD. It was created on their behalf by Glee Arvin. Manson Passey, OA an ophthalmic technician. The creation of this record is the provider's dictation and/or activities during the visit.    Electronically signed by: Glee Arvin. Manson Passey, OA 02/02/23 11:23 AM  Karie Chimera, M.D., Ph.D. Diseases & Surgery of the Retina and Vitreous [facility] 02/02/2023   I have reviewed the above documentation for accuracy and completeness, and I agree with the above. Karie Chimera, M.D., Ph.D. 02/02/23 11:25 AM   Abbreviations: M myopia (nearsighted); A astigmatism; H hyperopia (farsighted); P presbyopia; Mrx spectacle prescription;  CTL contact lenses; OD right eye; OS left eye;  OU both eyes  XT exotropia; ET esotropia; PEK punctate epithelial keratitis; PEE punctate epithelial erosions; DES dry eye syndrome; MGD meibomian gland dysfunction; ATs artificial tears; PFAT's preservative free artificial tears; NSC nuclear sclerotic cataract; PSC posterior subcapsular cataract; ERM epi-retinal membrane; PVD posterior vitreous detachment; RD retinal detachment; DM diabetes mellitus; DR diabetic retinopathy; NPDR non-proliferative diabetic retinopathy; PDR proliferative diabetic retinopathy; CSME clinically significant macular edema; DME diabetic macular edema; dbh dot blot hemorrhages; CWS cotton wool spot; POAG primary open angle glaucoma; C/D cup-to-disc ratio; HVF humphrey visual field; GVF goldmann visual field; OCT optical coherence tomography; IOP intraocular pressure; BRVO Branch retinal vein occlusion; CRVO central retinal vein occlusion; CRAO central retinal artery occlusion; BRAO branch retinal artery occlusion; RT retinal tear; SB scleral buckle; PPV pars plana vitrectomy; VH Vitreous hemorrhage; PRP panretinal laser photocoagulation; IVK intravitreal kenalog; VMT vitreomacular traction; MH Macular hole;  NVD neovascularization of the disc; NVE neovascularization elsewhere; AREDS age related eye disease study; ARMD age related macular degeneration; POAG primary open angle glaucoma; EBMD epithelial/anterior basement membrane dystrophy; ACIOL anterior chamber intraocular lens; IOL intraocular lens; PCIOL posterior chamber intraocular lens; Phaco/IOL phacoemulsification with intraocular lens placement; PRK photorefractive keratectomy; LASIK laser assisted in situ keratomileusis; HTN hypertension; DM diabetes mellitus; COPD chronic obstructive pulmonary disease

## 2023-02-02 ENCOUNTER — Encounter (INDEPENDENT_AMBULATORY_CARE_PROVIDER_SITE_OTHER): Payer: Self-pay | Admitting: Ophthalmology

## 2023-02-02 ENCOUNTER — Ambulatory Visit (INDEPENDENT_AMBULATORY_CARE_PROVIDER_SITE_OTHER): Payer: HMO | Admitting: Ophthalmology

## 2023-02-02 DIAGNOSIS — H35033 Hypertensive retinopathy, bilateral: Secondary | ICD-10-CM

## 2023-02-02 DIAGNOSIS — H26493 Other secondary cataract, bilateral: Secondary | ICD-10-CM | POA: Diagnosis not present

## 2023-02-02 DIAGNOSIS — Z961 Presence of intraocular lens: Secondary | ICD-10-CM

## 2023-02-02 DIAGNOSIS — H353211 Exudative age-related macular degeneration, right eye, with active choroidal neovascularization: Secondary | ICD-10-CM

## 2023-02-02 DIAGNOSIS — I10 Essential (primary) hypertension: Secondary | ICD-10-CM | POA: Diagnosis not present

## 2023-02-02 DIAGNOSIS — H353231 Exudative age-related macular degeneration, bilateral, with active choroidal neovascularization: Secondary | ICD-10-CM | POA: Diagnosis not present

## 2023-02-02 DIAGNOSIS — H353221 Exudative age-related macular degeneration, left eye, with active choroidal neovascularization: Secondary | ICD-10-CM

## 2023-02-02 MED ORDER — BEVACIZUMAB CHEMO INJECTION 1.25MG/0.05ML SYRINGE FOR KALEIDOSCOPE
1.2500 mg | INTRAVITREAL | Status: AC | PRN
Start: 2023-02-02 — End: 2023-02-02
  Administered 2023-02-02: 1.25 mg via INTRAVITREAL

## 2023-02-02 MED ORDER — AFLIBERCEPT 2MG/0.05ML IZ SOLN FOR KALEIDOSCOPE
2.0000 mg | INTRAVITREAL | Status: AC | PRN
Start: 2023-02-02 — End: 2023-02-02
  Administered 2023-02-02: 2 mg via INTRAVITREAL

## 2023-03-21 NOTE — Progress Notes (Signed)
[facility] - Clinic Note  03/23/2023     CHIEF COMPLAINT Patient presents for Retina Follow Up  HISTORY OF PRESENT ILLNESS: Dana Sandoval is a 82 y.o. 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  This started 7 weeks ago.  I, the attending physician,  performed the HPI with the patient and updated documentation appropriately.        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 Patient states vision about the same. No eye pain.      Last edited by Rennis Chris, MD on 03/23/2023  4:00 PM.     Pt states vision is stable  Referring physician: Joaquim Nam, MD [address] [place],  [place] [address]  HISTORICAL INFORMATION:  Selected notes from the MEDICAL RECORD NUMBER Referred by Dr. Laruth Bouchard for eval of ret heme OD   CURRENT MEDICATIONS: No current outpatient medications on file. (Ophthalmic Drugs)   No current facility-administered medications for this visit. (Ophthalmic Drugs)   Current Outpatient Medications (Other)  Medication Sig   Accu-Chek Softclix Lancets lancets Use to check blood sugar daily. Dx E11.9   apixaban (ELIQUIS) 5 MG TABS tablet TAKE 1 TABLET BY MOUTH TWICE (2) DAILY   atorvastatin (LIPITOR) 40 MG tablet TAKE 1 TABLET BY MOUTH ONCE A DAY AT 6PM   Blood Glucose Monitoring Suppl (ACCU-CHEK GUIDE) w/Device KIT Use to check blood sugar daily. Dx E11.9   glucose blood (ACCU-CHEK GUIDE) test strip USE TO CHECK BLOOD SUGAR DAILY   levothyroxine (SYNTHROID) 88 MCG tablet TAKE 1 TABLET BY MOUTH EVERY DAY   metFORMIN (GLUCOPHAGE) 500 MG tablet TAKE 1 TABLET BY MOUTH TWICE A DAY   Multiple Vitamins-Minerals (PRESERVISION AREDS 2 PO) Take 1 tablet by mouth 2 (two) times daily.    ramipril (ALTACE) 10 MG capsule TAKE 1 CAPSULE BY MOUTH EVERY DAY   valACYclovir (VALTREX) 500 MG tablet Take 500 mg by mouth daily as needed  (fever blister).   vitamin B-12 (CYANOCOBALAMIN) 1000 MCG tablet Take 1 tablet (1,000 mcg total) by mouth daily.   vitamin C (ASCORBIC ACID) 250 MG tablet Take 500 mg by mouth daily.   VITAMIN D PO Take 1,000 Units by mouth daily.   No current facility-administered medications for this visit. (Other)   REVIEW OF SYSTEMS: ROS   Positive for: Endocrine, Cardiovascular, Eyes Negative for: Constitutional, Gastrointestinal, Neurological, Skin, Genitourinary, Musculoskeletal, HENT, Respiratory, Psychiatric, Allergic/Imm, Heme/Lymph Last edited by Laddie Aquas, COA on 03/23/2023  7:56 AM.     ALLERGIES Allergies  Allergen Reactions   Codeine Itching    After 3 days   Dilaudid [Hydromorphone Hcl] Itching    After 3 days   PAST MEDICAL HISTORY Past Medical History:  Diagnosis Date   Aortic stenosis    Arthritis    B12 deficiency    Diabetes mellitus    type 2   Heart murmur    aortic stenosis   Hyperlipidemia    Hypertension    Hypothyroidism    IBS (irritable bowel syndrome)    Macular degeneration    Macular degeneration of right eye    NSVD (normal spontaneous vaginal delivery)    X 4   Pain    INTRASCAPULAR PAIN   SAB (spontaneous abortion)    X 2  Thyroid disease    HYPOTHYROIDISM   Past Surgical History:  Procedure Laterality Date   ABDOMINAL HYSTERECTOMY  1977   TAH  (FIBROIDS)   CATARACT EXTRACTION Bilateral    DILATION AND CURETTAGE OF UTERUS     DOPPLER ECHOCARDIOGRAPHY  2009   HAD AN ECHOCARDIOGRAM THAT SHOWED MODERATE MITRAL  ANNULAR CALCIFICATION AND A NORMAL EJECTION FRACTION OF 55%   SHOULDER SURGERY     RIGHT SHOULDER SURGERY   THYROIDECTOMY     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   RIGHT   TOTAL KNEE ARTHROPLASTY Left 09/10/2018   Procedure: TOTAL KNEE ARTHROPLASTY;  Surgeon: Marcene Corning, MD;  Location: [facility];  Service: Orthopedics;  Laterality: Left;   FAMILY HISTORY Family History  Problem Relation Age of Onset   Osteoporosis  Mother    Heart disease Mother    Macular degeneration Mother    Heart disease Father    Cancer Father        BRAIN TUMOR   Cancer Brother        BRAIN TUMOR   Diabetes Maternal Aunt    Diabetes Maternal Uncle    Stroke Paternal Grandfather    Colon cancer Neg Hx    Breast cancer Neg Hx    Migraines Neg Hx    SOCIAL HISTORY Social History   Tobacco Use   Smoking status: Never   Smokeless tobacco: Never  Vaping Use   Vaping status: Never Used  Substance Use Topics   Alcohol use: No    Alcohol/week: 0.0 standard drinks of alcohol   Drug use: No       OPHTHALMIC EXAM: Base Eye Exam     Visual Acuity (Snellen - Linear)       Right Left   Dist [place] 20/40 -2 20/30 -2   Dist ph [place] 20/40 +1 NI         Tonometry (Tonopen, 7:54 AM)       Right Left   Pressure 12 13         Pupils       Dark Light Shape React APD   Right 3 2 Round Brisk None   Left 3 2 Round Brisk None         Visual Fields (Counting fingers)       Left Right    Full Full         Extraocular Movement       Right Left    Full, Ortho Full, Ortho         Neuro/Psych     Oriented x3: Yes   Mood/Affect: Normal         Dilation     Both eyes: 1.0% Mydriacyl, 2.5% Phenylephrine @ 7:54 AM           Slit Lamp and Fundus Exam     Slit Lamp Exam       Right Left   Lids/Lashes Dermatochalasis - upper lid Dermatochalasis - upper lid   Conjunctiva/Sclera White and quiet White and quiet   Cornea 1+ Punctate epithelial erosions, fine endo pigment, trace tear film debris 1+ fine Punctate epithelial erosions, fine endo pigment, trace tear film debris   Anterior Chamber Deep and quiet Deep and quiet   Iris Round and dilated Round and dilated   Lens Toric PC IOL with marks at 1200 and 0600, 1+ Posterior capsular opacification nasal side approaching visual axis Toric PC IOL with marks at 1200 and 0600, 1+ Posterior capsular opacification  Anterior Vitreous Vitreous syneresis,  trace pigment, Posterior vitreous detachment, vitreous condensations Vitreous syneresis, Posterior vitreous detachment, vitreous condensations         Fundus Exam       Right Left   Disc mild Pallor, Sharp rim, +cupping, PPA mild Pallor, Sharp rim, +cupping, PPA   C/D Ratio 0.7 0.7   Macula Blunted foveal reflex, central CNV with subretinal heme -- stably resolved -- now with pigment ring, RPE mottling, clumping and atrophy, Drusen, stable improvement in shallow SRF overlying CNV, trace cystic changes temporal mac -- slightly improved Flat, Blunted foveal reflex, Drusen, RPE mottling, clumping and atrophy, focal IRH/SRH inferior to fovea -- stably improved, scattered patches of GA, no heme or fluid   Vessels attenuated, mild tortuosity attenuated, mild tortuosity   Periphery Attached, reticular degeneration, paving stone degeneration IT quad, No heme Attached, reticular degeneration, mild inferior paving stone degeneration, No heme           IMAGING AND PROCEDURES  Imaging and Procedures for 03/23/2023  OCT, Retina - OU - Both Eyes       Right Eye Quality was good. Central Foveal Thickness: 335. Progression has improved. Findings include no SRF, abnormal foveal contour, retinal drusen , subretinal hyper-reflective material, intraretinal fluid, pigment epithelial detachment, outer retinal atrophy (Mild interval improvement in PED and overlying cystic changes, Stable improvement in SRF overlying PED, +ORA).   Left Eye Quality was good. Central Foveal Thickness: 222. Progression has been stable. Findings include normal foveal contour, no IRF, no SRF, retinal drusen , outer retinal atrophy (stable improvement in [facility] and edema inferior fovea and macula).   Notes *Images captured and stored on drive  Diagnosis / Impression:  Ex ARMD OU OD: Mild interval improvement in PED and overlying cystic changes, Stable improvement in SRF overlying PED, +ORA OS: stable improvement in [facility] and  edema inferior fovea and macula  Clinical management:  See below  Abbreviations: NFP - Normal foveal profile. CME - cystoid macular edema. PED - pigment epithelial detachment. IRF - intraretinal fluid. SRF - subretinal fluid. EZ - ellipsoid zone. ERM - epiretinal membrane. ORA - outer retinal atrophy. ORT - outer retinal tubulation. SRHM - subretinal hyper-reflective material. IRHM - intraretinal hyper-reflective material      Intravitreal Injection, Pharmacologic Agent - OD - Right Eye       Time Out 03/23/2023. 9:05 AM. Confirmed correct patient, procedure, site, and patient consented.   Anesthesia Topical anesthesia was used. Anesthetic medications included Lidocaine 2%, Proparacaine 0.5%.   Procedure Preparation included 5% betadine to ocular surface, eyelid speculum. A (32g) needle was used.   Injection: 2 mg aflibercept 2 MG/0.05ML   Route: Intravitreal, Site: Right Eye   NDC: L6038910, Lot: [id number], Expiration date: 06/08/2024, Waste: 0 mL   Post-op Post injection exam found visual acuity of at least counting fingers. The patient tolerated the procedure well. There were no complications. The patient received written and verbal post procedure care education. Post injection medications were not given.      Intravitreal Injection, Pharmacologic Agent - OS - Left Eye       Time Out 03/23/2023. 9:05 AM. Confirmed correct patient, procedure, site, and patient consented.   Anesthesia Topical anesthesia was used. Anesthetic medications included Lidocaine 2%, Proparacaine 0.5%.   Procedure Preparation included 5% betadine to ocular surface, eyelid speculum. A supplied (33g) needle was used.   Injection: 1.25 mg Bevacizumab 1.25mg /0.89ml   Route: Intravitreal, Site: Left Eye   NDC: P3213405, Lot: [id number], Expiration  date: 04/09/2023   Post-op Post injection exam found visual acuity of at least counting fingers. The patient tolerated the procedure well. There  were no complications. The patient received written and verbal post procedure care education.            ASSESSMENT/PLAN:   ICD-10-CM   1. Exudative age-related macular degeneration of right eye with active choroidal neovascularization (HCC)  H35.3211 OCT, Retina - OU - Both Eyes    Intravitreal Injection, Pharmacologic Agent - OD - Right Eye    aflibercept (EYLEA) SOLN 2 mg    2. Exudative age-related macular degeneration of left eye with active choroidal neovascularization (HCC)  H35.3221 OCT, Retina - OU - Both Eyes    Intravitreal Injection, Pharmacologic Agent - OS - Left Eye    Bevacizumab (AVASTIN) SOLN 1.25 mg    3. Essential hypertension  I10     4. Hypertensive retinopathy of both eyes  H35.033     5. Pseudophakia, both eyes  Z96.1     6. PCO (posterior capsule opacification), bilateral  H26.493       1. Exudative age related macular degeneration, OD  - s/p IVA OD #1 (05.06.22), #2 (06.03.22), #3 (07.01.22), #4 (07.29.22), #5 (08.26.22), #6 (09.23.22), #7 (10.21.22), #8 (11.18.22), #9 (12.16.22), #10 (01.20.23) -- IVA resistance  - s/p IVE OD #1 (02.24.23) -- sample, #2 (03.24.23), #3 (04.21.23), #4 (05.19.23), #5 (06.16.23), #6 (07.21.23), #7 (09.01.23), #8 (10.27.23), #9 (12.15.23), #10 (02.02.24), #11 (03.29.24), #12 (06.06.24) #13(07.26.24)  - OCT shows OD: Mild interval improvement in PED and overlying cystic changes, Stable improvement in SRF overlying PED, +ORA at 7 weeks  - BCVA 20/40 OD -- stable  - recommend IVE OD #14 today, 08.13.24 w/ f/u in 7 wks again  - RBA of procedure discussed, questions answered - see procedure note - IVE informed consent obtained and signed, 02.24.23 (OD)  - f/u 7 wks -- DFE/OCT, possible injection, tx and ext as able  2. Exudative age related macular degeneration, left eye   - early conversion to exudative ARMD -- noted on 01.04.24  - s/p IVA OS #1 (01.04.24), #2 (02.02.24), #3 (03.01.24), #4 (03.29.24), #5 (05.03.24), #6  (06.06.24), #7 (07.26.24)  - pt reported new superior paracentral scotoma and +distortion OS -- improved  - BCVA OS 20/30 -- stable  - OCT shows stable improvement in [facility] and edema inferior fovea and macula at 7 wks - recommend IVA OS #7 today, 07.26.24 w/ f/u in 7 wks again - pt wishes to be treated with IVA  - RBA of procedure discussed, questions answered - informed consent obtained and signed - see procedure note  - f/u in 7 wks, DFE, OCT, possible injxns  3,4. Hypertensive retinopathy OU - discussed importance of tight BP control -  monitor  5. Pseudophakia OU  - s/p CE/IOL  - IOL in good position, doing well  - monitor   6. PCO OU (OD > OS)  - monitor   Ophthalmic Meds Ordered this visit:  Meds ordered this encounter  Medications   aflibercept (EYLEA) SOLN 2 mg   Bevacizumab (AVASTIN) SOLN 1.25 mg     Return in about 7 weeks (around 05/11/2023) for f/u exu ARMD OU, DFE, OCT.  There are no Patient Instructions on file for this visit.  This document serves as a record of services personally performed by Karie Chimera, MD, PhD. It was created on their behalf by Berlin Hun COT, an ophthalmic technician. The creation of this record  is the provider's dictation and/or activities during the visit.    Electronically signed by: Berlin Hun COT 09.11.24 4:02 PM  This document serves as a record of services personally performed by Karie Chimera, MD, PhD. It was created on their behalf by Glee Arvin. Manson Passey, OA an ophthalmic technician. The creation of this record is the provider's dictation and/or activities during the visit.    Electronically signed by: Glee Arvin. Manson Passey, OA 03/23/23 4:02 PM  Karie Chimera, M.D., Ph.D. Diseases & Surgery of the Retina and Vitreous [facility] 03/23/2023   I have reviewed the above documentation for accuracy and completeness, and I agree with the above. Karie Chimera, M.D., Ph.D. 03/23/23 4:06  PM  Abbreviations: M myopia (nearsighted); A astigmatism; H hyperopia (farsighted); P presbyopia; Mrx spectacle prescription;  CTL contact lenses; OD right eye; OS left eye; OU both eyes  XT exotropia; ET esotropia; PEK punctate epithelial keratitis; PEE punctate epithelial erosions; DES dry eye syndrome; MGD meibomian gland dysfunction; ATs artificial tears; PFAT's preservative free artificial tears; NSC nuclear sclerotic cataract; PSC posterior subcapsular cataract; ERM epi-retinal membrane; PVD posterior vitreous detachment; RD retinal detachment; DM diabetes mellitus; DR diabetic retinopathy; NPDR non-proliferative diabetic retinopathy; PDR proliferative diabetic retinopathy; CSME clinically significant macular edema; DME diabetic macular edema; dbh dot blot hemorrhages; CWS cotton wool spot; POAG primary open angle glaucoma; C/D cup-to-disc ratio; HVF humphrey visual field; GVF goldmann visual field; OCT optical coherence tomography; IOP intraocular pressure; BRVO Branch retinal vein occlusion; CRVO central retinal vein occlusion; CRAO central retinal artery occlusion; BRAO branch retinal artery occlusion; RT retinal tear; SB scleral buckle; PPV pars plana vitrectomy; VH Vitreous hemorrhage; PRP panretinal laser photocoagulation; IVK intravitreal kenalog; VMT vitreomacular traction; MH Macular hole;  NVD neovascularization of the disc; NVE neovascularization elsewhere; AREDS age related eye disease study; ARMD age related macular degeneration; POAG primary open angle glaucoma; EBMD epithelial/anterior basement membrane dystrophy; ACIOL anterior chamber intraocular lens; IOL intraocular lens; PCIOL posterior chamber intraocular lens; Phaco/IOL phacoemulsification with intraocular lens placement; PRK photorefractive keratectomy; LASIK laser assisted in situ keratomileusis; HTN hypertension; DM diabetes mellitus; COPD chronic obstructive pulmonary disease

## 2023-03-23 ENCOUNTER — Ambulatory Visit (INDEPENDENT_AMBULATORY_CARE_PROVIDER_SITE_OTHER): Payer: HMO | Admitting: Ophthalmology

## 2023-03-23 ENCOUNTER — Encounter (INDEPENDENT_AMBULATORY_CARE_PROVIDER_SITE_OTHER): Payer: Self-pay | Admitting: Ophthalmology

## 2023-03-23 DIAGNOSIS — I10 Essential (primary) hypertension: Secondary | ICD-10-CM | POA: Diagnosis not present

## 2023-03-23 DIAGNOSIS — H26493 Other secondary cataract, bilateral: Secondary | ICD-10-CM

## 2023-03-23 DIAGNOSIS — H353231 Exudative age-related macular degeneration, bilateral, with active choroidal neovascularization: Secondary | ICD-10-CM | POA: Diagnosis not present

## 2023-03-23 DIAGNOSIS — H35033 Hypertensive retinopathy, bilateral: Secondary | ICD-10-CM | POA: Diagnosis not present

## 2023-03-23 DIAGNOSIS — Z961 Presence of intraocular lens: Secondary | ICD-10-CM

## 2023-03-23 DIAGNOSIS — H353211 Exudative age-related macular degeneration, right eye, with active choroidal neovascularization: Secondary | ICD-10-CM

## 2023-03-23 DIAGNOSIS — H353221 Exudative age-related macular degeneration, left eye, with active choroidal neovascularization: Secondary | ICD-10-CM

## 2023-03-23 MED ORDER — BEVACIZUMAB CHEMO INJECTION 1.25MG/0.05ML SYRINGE FOR KALEIDOSCOPE
1.2500 mg | INTRAVITREAL | Status: AC | PRN
Start: 2023-03-23 — End: 2023-03-23
  Administered 2023-03-23: 1.25 mg via INTRAVITREAL

## 2023-03-23 MED ORDER — AFLIBERCEPT 2MG/0.05ML IZ SOLN FOR KALEIDOSCOPE
2.0000 mg | INTRAVITREAL | Status: AC | PRN
Start: 2023-03-23 — End: 2023-03-23
  Administered 2023-03-23: 2 mg via INTRAVITREAL

## 2023-04-27 ENCOUNTER — Telehealth: Payer: Self-pay | Admitting: Cardiology

## 2023-04-27 DIAGNOSIS — I480 Paroxysmal atrial fibrillation: Secondary | ICD-10-CM

## 2023-04-27 DIAGNOSIS — G459 Transient cerebral ischemic attack, unspecified: Secondary | ICD-10-CM

## 2023-04-27 MED ORDER — APIXABAN 5 MG PO TABS
5.0000 mg | ORAL_TABLET | Freq: Two times a day (BID) | ORAL | 1 refills | Status: DC
  Filled 2023-07-02 – 2023-07-23 (×4): qty 180, 90d supply, fill #0

## 2023-04-27 NOTE — Telephone Encounter (Signed)
*  STAT* If patient is at the pharmacy, call can be transferred to refill team.   1. Which medications need to be refilled? (please list name of each medication and dose if known) apixaban (ELIQUIS) 5 MG TABS tablet   2. Which pharmacy/location (including street and city if local pharmacy) is medication to be sent to?  [id number] [place], [place] - [address]    3. Do they need a 30 day or 90 day supply? 90

## 2023-04-27 NOTE — Telephone Encounter (Signed)
Prescription refill request for Eliquis received. Indication: afib  Last office visit: [place], 10/11/2022 Scr: 0.81, 09/22/2022 Age: 82 yo  Weight:  75.3 kg   Refill sent.

## 2023-05-02 NOTE — Progress Notes (Signed)
[facility] - Clinic Note  05/10/2023     CHIEF COMPLAINT Patient presents for Retina Follow Up  HISTORY OF PRESENT ILLNESS: Dana Sandoval is a 82 y.o. 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  This started 7 weeks ago.  I, the attending physician,  performed the HPI with the patient and updated documentation appropriately.        Comments   Patient feels the vision is the same. She is not using eye drops. Her blood sugar was 130.      Last edited by Rennis Chris, MD on 05/10/2023  1:04 PM.    Patient states the vision is the same. She states that after the last injection she couldn't see well, she saw scratches in her vision and it lasted 5 minutes.   Referring physician: Joaquim Nam, MD [address] [place],  [place] [address]  HISTORICAL INFORMATION:  Selected notes from the MEDICAL RECORD NUMBER Referred by Dr. Laruth Bouchard for eval of ret heme OD   CURRENT MEDICATIONS: No current outpatient medications on file. (Ophthalmic Drugs)   No current facility-administered medications for this visit. (Ophthalmic Drugs)   Current Outpatient Medications (Other)  Medication Sig   Accu-Chek Softclix Lancets lancets Use to check blood sugar daily. Dx E11.9   apixaban (ELIQUIS) 5 MG TABS tablet TAKE 1 TABLET BY MOUTH TWICE (2) DAILY   atorvastatin (LIPITOR) 40 MG tablet TAKE 1 TABLET BY MOUTH ONCE A DAY AT 6PM   Blood Glucose Monitoring Suppl (ACCU-CHEK GUIDE) w/Device KIT Use to check blood sugar daily. Dx E11.9   glucose blood (ACCU-CHEK GUIDE) test strip USE TO CHECK BLOOD SUGAR DAILY   levothyroxine (SYNTHROID) 88 MCG tablet TAKE 1 TABLET BY MOUTH EVERY DAY   metFORMIN (GLUCOPHAGE) 500 MG tablet TAKE 1 TABLET BY MOUTH TWICE A DAY   Multiple Vitamins-Minerals (PRESERVISION AREDS 2 PO) Take 1 tablet by mouth 2 (two) times daily.    ramipril (ALTACE) 10 MG capsule  TAKE 1 CAPSULE BY MOUTH EVERY DAY   valACYclovir (VALTREX) 500 MG tablet Take 500 mg by mouth daily as needed (fever blister).   vitamin B-12 (CYANOCOBALAMIN) 1000 MCG tablet Take 1 tablet (1,000 mcg total) by mouth daily.   vitamin C (ASCORBIC ACID) 250 MG tablet Take 500 mg by mouth daily.   VITAMIN D PO Take 1,000 Units by mouth daily.   No current facility-administered medications for this visit. (Other)   REVIEW OF SYSTEMS: ROS   Positive for: Endocrine, Cardiovascular, Eyes Negative for: Constitutional, Gastrointestinal, Neurological, Skin, Genitourinary, Musculoskeletal, HENT, Respiratory, Psychiatric, Allergic/Imm, Heme/Lymph Last edited by Charlette Caffey, COT on 05/10/2023  9:01 AM.      ALLERGIES Allergies  Allergen Reactions   Codeine Itching    After 3 days   Dilaudid [Hydromorphone Hcl] Itching    After 3 days   PAST MEDICAL HISTORY Past Medical History:  Diagnosis Date   Aortic stenosis    Arthritis    B12 deficiency    Diabetes mellitus    type 2   Heart murmur    aortic stenosis   Hyperlipidemia    Hypertension    Hypothyroidism    IBS (irritable bowel syndrome)    Macular degeneration    Macular degeneration of right eye    NSVD (normal spontaneous vaginal delivery)  X 4   Pain    INTRASCAPULAR PAIN   SAB (spontaneous abortion)    X 2   Thyroid disease    HYPOTHYROIDISM   Past Surgical History:  Procedure Laterality Date   ABDOMINAL HYSTERECTOMY  1977   TAH  (FIBROIDS)   CATARACT EXTRACTION Bilateral    DILATION AND CURETTAGE OF UTERUS     DOPPLER ECHOCARDIOGRAPHY  2009   HAD AN ECHOCARDIOGRAM THAT SHOWED MODERATE MITRAL  ANNULAR CALCIFICATION AND A NORMAL EJECTION FRACTION OF 55%   SHOULDER SURGERY     RIGHT SHOULDER SURGERY   THYROIDECTOMY     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   RIGHT   TOTAL KNEE ARTHROPLASTY Left 09/10/2018   Procedure: TOTAL KNEE ARTHROPLASTY;  Surgeon: Marcene Corning, MD;  Location: [facility];  Service:  Orthopedics;  Laterality: Left;   FAMILY HISTORY Family History  Problem Relation Age of Onset   Osteoporosis Mother    Heart disease Mother    Macular degeneration Mother    Heart disease Father    Cancer Father        BRAIN TUMOR   Cancer Brother        BRAIN TUMOR   Diabetes Maternal Aunt    Diabetes Maternal Uncle    Stroke Paternal Grandfather    Colon cancer Neg Hx    Breast cancer Neg Hx    Migraines Neg Hx    SOCIAL HISTORY Social History   Tobacco Use   Smoking status: Never   Smokeless tobacco: Never  Vaping Use   Vaping status: Never Used  Substance Use Topics   Alcohol use: No    Alcohol/week: 0.0 standard drinks of alcohol   Drug use: No       OPHTHALMIC EXAM: Base Eye Exam     Visual Acuity (Snellen - Linear)       Right Left   Dist [place] 20/40 20/30   Dist ph [place] NI NI         Tonometry (Tonopen, 9:04 AM)       Right Left   Pressure 15 18         Pupils       Dark Light Shape React APD   Right 3 2 Round Brisk None   Left 3 2 Round Brisk None         Visual Fields       Left Right    Full Full         Extraocular Movement       Right Left    Full, Ortho Full, Ortho         Neuro/Psych     Oriented x3: Yes   Mood/Affect: Normal         Dilation     Both eyes: 1.0% Mydriacyl, 2.5% Phenylephrine @ 9:01 AM           Slit Lamp and Fundus Exam     Slit Lamp Exam       Right Left   Lids/Lashes Dermatochalasis - upper lid Dermatochalasis - upper lid   Conjunctiva/Sclera White and quiet White and quiet   Cornea 1+ Punctate epithelial erosions, fine endo pigment, trace tear film debris 1+ fine Punctate epithelial erosions, fine endo pigment, trace tear film debris   Anterior Chamber Deep and quiet Deep and quiet   Iris Round and dilated Round and dilated   Lens Toric PC IOL with marks at 1200 and 0600, 1+ Posterior capsular opacification nasal side approaching  visual axis Toric PC IOL with marks at 1200 and  0600, 1+ Posterior capsular opacification   Anterior Vitreous Vitreous syneresis, trace pigment, Posterior vitreous detachment, vitreous condensations Vitreous syneresis, Posterior vitreous detachment, vitreous condensations         Fundus Exam       Right Left   Disc mild Pallor, Sharp rim, +cupping, PPA mild Pallor, Sharp rim, +cupping, PPA   C/D Ratio 0.7 0.7   Macula Blunted foveal reflex, central CNV with subretinal heme -- stably resolved -- now with pigment ring, RPE mottling, clumping and atrophy, Drusen, stable improvement in shallow SRF overlying CNV, trace cystic changes temporal mac Flat, Blunted foveal reflex, Drusen, RPE mottling, clumping and atrophy, focal IRH/SRH inferior to fovea -- stably improved, scattered patches of GA, no heme or fluid   Vessels attenuated, mild tortuosity attenuated, mild tortuosity   Periphery Attached, reticular degeneration, paving stone degeneration IT quad, No heme Attached, reticular degeneration, mild inferior paving stone degeneration, No heme           IMAGING AND PROCEDURES  Imaging and Procedures for 05/10/2023  OCT, Retina - OU - Both Eyes       Right Eye Quality was good. Central Foveal Thickness: 328. Progression has been stable. Findings include no SRF, abnormal foveal contour, retinal drusen , subretinal hyper-reflective material, intraretinal fluid, pigment epithelial detachment, outer retinal atrophy (persistent PED and overlying cystic changes, Stable improvement in SRF overlying PED, +ORA).   Left Eye Quality was good. Central Foveal Thickness: 222. Progression has been stable. Findings include normal foveal contour, no IRF, no SRF, retinal drusen , outer retinal atrophy (stable improvement in [facility] and edema inferior fovea and macula).   Notes *Images captured and stored on drive  Diagnosis / Impression:  Ex ARMD OU OD: persistent PED and overlying cystic changes, Stable improvement in SRF overlying PED, +ORA OS:  stable improvement in [facility] and edema inferior fovea and macula  Clinical management:  See below  Abbreviations: NFP - Normal foveal profile. CME - cystoid macular edema. PED - pigment epithelial detachment. IRF - intraretinal fluid. SRF - subretinal fluid. EZ - ellipsoid zone. ERM - epiretinal membrane. ORA - outer retinal atrophy. ORT - outer retinal tubulation. SRHM - subretinal hyper-reflective material. IRHM - intraretinal hyper-reflective material      Intravitreal Injection, Pharmacologic Agent - OD - Right Eye       Time Out 05/10/2023. 9:54 AM. Confirmed correct patient, procedure, site, and patient consented.   Anesthesia Topical anesthesia was used. Anesthetic medications included Lidocaine 2%, Proparacaine 0.5%.   Procedure Preparation included 5% betadine to ocular surface, eyelid speculum. A (32g) needle was used.   Injection: 2 mg aflibercept 2 MG/0.05ML   Route: Intravitreal, Site: Right Eye   NDC: L6038910, Lot: [id number], Expiration date: 05/09/2024, Waste: 0 mL   Post-op Post injection exam found visual acuity of at least counting fingers. The patient tolerated the procedure well. There were no complications. The patient received written and verbal post procedure care education. Post injection medications were not given.      Intravitreal Injection, Pharmacologic Agent - OS - Left Eye       Time Out 05/10/2023. 9:55 AM. Confirmed correct patient, procedure, site, and patient consented.   Anesthesia Topical anesthesia was used. Anesthetic medications included Lidocaine 2%, Proparacaine 0.5%.   Procedure Preparation included 5% betadine to ocular surface, eyelid speculum. A (33g) needle was used.   Injection: 1.25 mg Bevacizumab 1.25mg /0.73ml   Route: Intravitreal, Site: Left  Eye   NDC: 96295-284-13, Lot: [id number], Expiration date: 07/08/2023   Post-op Post injection exam found visual acuity of at least counting fingers. The patient tolerated the  procedure well. There were no complications. The patient received written and verbal post procedure care education.            ASSESSMENT/PLAN:   ICD-10-CM   1. Exudative age-related macular degeneration of right eye with active choroidal neovascularization (HCC)  H35.3211 OCT, Retina - OU - Both Eyes    Intravitreal Injection, Pharmacologic Agent - OD - Right Eye    aflibercept (EYLEA) SOLN 2 mg    2. Exudative age-related macular degeneration of left eye with active choroidal neovascularization (HCC)  H35.3221 Intravitreal Injection, Pharmacologic Agent - OS - Left Eye    Bevacizumab (AVASTIN) SOLN 1.25 mg    3. Essential hypertension  I10     4. Hypertensive retinopathy of both eyes  H35.033     5. Pseudophakia, both eyes  Z96.1     6. PCO (posterior capsule opacification), bilateral  H26.493      1. Exudative age related macular degeneration, OD - s/p IVA OD #1 (05.06.22), #2 (06.03.22), #3 (07.01.22), #4 (07.29.22), #5 (08.26.22), #6 (09.23.22), #7 (10.21.22), #8 (11.18.22), #9 (12.16.22), #10 (01.20.23) -- IVA resistance - s/p IVE OD #1 (02.24.23) -- sample, #2 (03.24.23), #3 (04.21.23), #4 (05.19.23), #5 (06.16.23), #6 (07.21.23), #7 (09.01.23), #8 (10.27.23), #9 (12.15.23), #10 (02.02.24), #11 (03.29.24), #12 (06.06.24) #13 (07.26.24), #14 (09.13.24) - OCT shows OD: persistent PED and overlying cystic changes, Stable improvement in SRF overlying PED, +ORA at 7 weeks  - BCVA 20/40 OD -- stable  - recommend IVE OD #15 today, 10.31.24 w/ f/u ext to 8 wks  - RBA of procedure discussed, questions answered - see procedure note - IVE informed consent obtained and signed, 02.24.23 (OD)  - f/u 8 wks -- DFE/OCT, possible injection, tx and ext as able  2. Exudative age related macular degeneration, left eye   - early conversion to exudative ARMD -- noted on 01.04.24 - s/p IVA OS #1 (01.04.24), #2 (02.02.24), #3 (03.01.24), #4 (03.29.24), #5 (05.03.24), #6 (06.06.24), #7 (07.26.24),  #8 (09.13.24)  - pt reported new superior paracentral scotoma and +distortion OS -- improved  - BCVA OS 20/30 -- stable - OCT shows stable improvement in [facility] and edema inferior fovea and macula at 7 wks - recommend IVA OS #9 today, 10.31.24 w/ f/u ext to 8 wks - pt wishes to be treated with IVA  - RBA of procedure discussed, questions answered - informed consent obtained and signed - see procedure note  - f/u in 8 wks, DFE, OCT, possible injxns  3,4. Hypertensive retinopathy OU - discussed importance of tight BP control -  monitor  5. Pseudophakia OU  - s/p CE/IOL  - IOL in good position, doing well  - monitor   6. PCO OU (OD > OS)  - monitor   Ophthalmic Meds Ordered this visit:  Meds ordered this encounter  Medications   Bevacizumab (AVASTIN) SOLN 1.25 mg   aflibercept (EYLEA) SOLN 2 mg     Return in about 8 weeks (around 07/05/2023) for f/u Ex. AMD OU, DFE, OCT, Possible, IVE, OD, IVA, OS.  There are no Patient Instructions on file for this visit.  This document serves as a record of services personally performed by Karie Chimera, MD, PhD. It was created on their behalf by Glee Arvin. Manson Passey, OA an ophthalmic technician. The creation of this record  is the provider's dictation and/or activities during the visit.    Electronically signed by: Glee Arvin. Manson Passey, OA 05/10/23 1:04 PM  This document serves as a record of services personally performed by Karie Chimera, MD, PhD. It was created on their behalf by Charlette Caffey, COT an ophthalmic technician. The creation of this record is the provider's dictation and/or activities during the visit.    Electronically signed by:  Charlette Caffey, COT  05/10/23 1:04 PM  Karie Chimera, M.D., Ph.D. Diseases & Surgery of the Retina and Vitreous [facility] 05/10/2023   I have reviewed the above documentation for accuracy and completeness, and I agree with the above. Karie Chimera, M.D., Ph.D.  05/10/23 1:05 PM   Abbreviations: M myopia (nearsighted); A astigmatism; H hyperopia (farsighted); P presbyopia; Mrx spectacle prescription;  CTL contact lenses; OD right eye; OS left eye; OU both eyes  XT exotropia; ET esotropia; PEK punctate epithelial keratitis; PEE punctate epithelial erosions; DES dry eye syndrome; MGD meibomian gland dysfunction; ATs artificial tears; PFAT's preservative free artificial tears; NSC nuclear sclerotic cataract; PSC posterior subcapsular cataract; ERM epi-retinal membrane; PVD posterior vitreous detachment; RD retinal detachment; DM diabetes mellitus; DR diabetic retinopathy; NPDR non-proliferative diabetic retinopathy; PDR proliferative diabetic retinopathy; CSME clinically significant macular edema; DME diabetic macular edema; dbh dot blot hemorrhages; CWS cotton wool spot; POAG primary open angle glaucoma; C/D cup-to-disc ratio; HVF humphrey visual field; GVF goldmann visual field; OCT optical coherence tomography; IOP intraocular pressure; BRVO Branch retinal vein occlusion; CRVO central retinal vein occlusion; CRAO central retinal artery occlusion; BRAO branch retinal artery occlusion; RT retinal tear; SB scleral buckle; PPV pars plana vitrectomy; VH Vitreous hemorrhage; PRP panretinal laser photocoagulation; IVK intravitreal kenalog; VMT vitreomacular traction; MH Macular hole;  NVD neovascularization of the disc; NVE neovascularization elsewhere; AREDS age related eye disease study; ARMD age related macular degeneration; POAG primary open angle glaucoma; EBMD epithelial/anterior basement membrane dystrophy; ACIOL anterior chamber intraocular lens; IOL intraocular lens; PCIOL posterior chamber intraocular lens; Phaco/IOL phacoemulsification with intraocular lens placement; PRK photorefractive keratectomy; LASIK laser assisted in situ keratomileusis; HTN hypertension; DM diabetes mellitus; COPD chronic obstructive pulmonary disease

## 2023-05-10 ENCOUNTER — Ambulatory Visit (INDEPENDENT_AMBULATORY_CARE_PROVIDER_SITE_OTHER): Payer: HMO | Admitting: Ophthalmology

## 2023-05-10 ENCOUNTER — Encounter (INDEPENDENT_AMBULATORY_CARE_PROVIDER_SITE_OTHER): Payer: Self-pay | Admitting: Ophthalmology

## 2023-05-10 DIAGNOSIS — Z961 Presence of intraocular lens: Secondary | ICD-10-CM

## 2023-05-10 DIAGNOSIS — H353221 Exudative age-related macular degeneration, left eye, with active choroidal neovascularization: Secondary | ICD-10-CM

## 2023-05-10 DIAGNOSIS — H35033 Hypertensive retinopathy, bilateral: Secondary | ICD-10-CM

## 2023-05-10 DIAGNOSIS — H353231 Exudative age-related macular degeneration, bilateral, with active choroidal neovascularization: Secondary | ICD-10-CM | POA: Diagnosis not present

## 2023-05-10 DIAGNOSIS — H353211 Exudative age-related macular degeneration, right eye, with active choroidal neovascularization: Secondary | ICD-10-CM

## 2023-05-10 DIAGNOSIS — I10 Essential (primary) hypertension: Secondary | ICD-10-CM | POA: Diagnosis not present

## 2023-05-10 DIAGNOSIS — H26493 Other secondary cataract, bilateral: Secondary | ICD-10-CM

## 2023-05-10 MED ORDER — AFLIBERCEPT 2MG/0.05ML IZ SOLN FOR KALEIDOSCOPE
2.0000 mg | INTRAVITREAL | Status: AC | PRN
Start: 2023-05-10 — End: 2023-05-10
  Administered 2023-05-10: 2 mg via INTRAVITREAL

## 2023-05-10 MED ORDER — BEVACIZUMAB CHEMO INJECTION 1.25MG/0.05ML SYRINGE FOR KALEIDOSCOPE
1.2500 mg | INTRAVITREAL | Status: AC | PRN
Start: 2023-05-10 — End: 2023-05-10
  Administered 2023-05-10: 1.25 mg via INTRAVITREAL

## 2023-05-11 ENCOUNTER — Telehealth: Payer: Self-pay | Admitting: Family Medicine

## 2023-05-11 ENCOUNTER — Encounter (INDEPENDENT_AMBULATORY_CARE_PROVIDER_SITE_OTHER): Payer: HMO | Admitting: Ophthalmology

## 2023-05-11 NOTE — Telephone Encounter (Signed)
Received order for Dexa from [facility]. Placed in your box for review.

## 2023-05-16 NOTE — Telephone Encounter (Signed)
I will work on the hardcopy.  Thanks. 

## 2023-06-21 ENCOUNTER — Other Ambulatory Visit (HOSPITAL_COMMUNITY): Payer: Self-pay

## 2023-06-21 DIAGNOSIS — Z8262 Family history of osteoporosis: Secondary | ICD-10-CM | POA: Diagnosis not present

## 2023-06-21 DIAGNOSIS — Z1231 Encounter for screening mammogram for malignant neoplasm of breast: Secondary | ICD-10-CM | POA: Diagnosis not present

## 2023-06-21 DIAGNOSIS — N958 Other specified menopausal and perimenopausal disorders: Secondary | ICD-10-CM | POA: Diagnosis not present

## 2023-06-21 DIAGNOSIS — R29890 Loss of height: Secondary | ICD-10-CM | POA: Diagnosis not present

## 2023-06-21 DIAGNOSIS — E119 Type 2 diabetes mellitus without complications: Secondary | ICD-10-CM | POA: Diagnosis not present

## 2023-06-21 LAB — HM MAMMOGRAPHY

## 2023-06-21 LAB — HM DEXA SCAN: HM Dexa Scan: NORMAL

## 2023-06-22 ENCOUNTER — Encounter: Payer: Self-pay | Admitting: Family Medicine

## 2023-06-22 ENCOUNTER — Other Ambulatory Visit: Payer: Self-pay

## 2023-06-22 ENCOUNTER — Other Ambulatory Visit (HOSPITAL_COMMUNITY): Payer: Self-pay

## 2023-06-27 NOTE — Progress Notes (Signed)
[facility] - Clinic Note  06/29/2023     CHIEF COMPLAINT Patient presents for Retina Follow Up  HISTORY OF PRESENT ILLNESS: Dana Sandoval is a 82 y.o. 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  This started 8 weeks ago.  I, the attending physician,  performed the HPI with the patient and updated documentation appropriately.        Comments   Patient here for 8 weeks retina follow up for exu ARMD OU. Patient states vision about the same. No eye pain.      Last edited by Rennis Chris, MD on 06/29/2023 11:38 AM.     Patient states   Referring physician: Joaquim Nam, MD [address] [place],  [place] [address]  HISTORICAL INFORMATION:  Selected notes from the MEDICAL RECORD NUMBER Referred by Dr. Laruth Bouchard for eval of ret heme OD   CURRENT MEDICATIONS: No current outpatient medications on file. (Ophthalmic Drugs)   No current facility-administered medications for this visit. (Ophthalmic Drugs)   Current Outpatient Medications (Other)  Medication Sig   Accu-Chek Softclix Lancets lancets Use to check blood sugar daily. Dx E11.9   apixaban (ELIQUIS) 5 MG TABS tablet Take 1 tablet (5 mg total) by mouth 2 (two) times daily.   atorvastatin (LIPITOR) 40 MG tablet TAKE 1 TABLET BY MOUTH ONCE A DAY AT 6PM   Blood Glucose Monitoring Suppl (ACCU-CHEK GUIDE) w/Device KIT Use to check blood sugar daily. Dx E11.9   glucose blood (ACCU-CHEK GUIDE) test strip USE TO CHECK BLOOD SUGAR DAILY   levothyroxine (SYNTHROID) 88 MCG tablet Take 1 tablet (88 mcg total) by mouth daily.   metFORMIN (GLUCOPHAGE) 500 MG tablet TAKE 1 TABLET BY MOUTH TWICE A DAY   Multiple Vitamins-Minerals (PRESERVISION AREDS 2 PO) Take 1 tablet by mouth 2 (two) times daily.    ramipril (ALTACE) 10 MG capsule TAKE 1 CAPSULE BY MOUTH EVERY DAY   valACYclovir (VALTREX) 500 MG tablet Take 500 mg by mouth  daily as needed (fever blister).   vitamin B-12 (CYANOCOBALAMIN) 1000 MCG tablet Take 1 tablet (1,000 mcg total) by mouth daily.   vitamin C (ASCORBIC ACID) 250 MG tablet Take 500 mg by mouth daily.   VITAMIN D PO Take 1,000 Units by mouth daily.   No current facility-administered medications for this visit. (Other)   REVIEW OF SYSTEMS: ROS   Positive for: Endocrine, Cardiovascular, Eyes Negative for: Constitutional, Gastrointestinal, Neurological, Skin, Genitourinary, Musculoskeletal, HENT, Respiratory, Psychiatric, Allergic/Imm, Heme/Lymph Last edited by Laddie Aquas, COA on 06/29/2023  8:06 AM.     ALLERGIES Allergies  Allergen Reactions   Codeine Itching    After 3 days   Dilaudid [Hydromorphone Hcl] Itching    After 3 days   PAST MEDICAL HISTORY Past Medical History:  Diagnosis Date   Aortic stenosis    Arthritis    B12 deficiency    Diabetes mellitus    type 2   Heart murmur    aortic stenosis   Hyperlipidemia    Hypertension    Hypothyroidism    IBS (irritable bowel syndrome)    Macular degeneration    Macular degeneration of right eye    NSVD (normal spontaneous vaginal delivery)    X 4   Pain    INTRASCAPULAR PAIN   SAB (spontaneous abortion)    X  2   Thyroid disease    HYPOTHYROIDISM   Past Surgical History:  Procedure Laterality Date   ABDOMINAL HYSTERECTOMY  1977   TAH  (FIBROIDS)   CATARACT EXTRACTION Bilateral    DILATION AND CURETTAGE OF UTERUS     DOPPLER ECHOCARDIOGRAPHY  2009   HAD AN ECHOCARDIOGRAM THAT SHOWED MODERATE MITRAL  ANNULAR CALCIFICATION AND A NORMAL EJECTION FRACTION OF 55%   SHOULDER SURGERY     RIGHT SHOULDER SURGERY   THYROIDECTOMY     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   RIGHT   TOTAL KNEE ARTHROPLASTY Left 09/10/2018   Procedure: TOTAL KNEE ARTHROPLASTY;  Surgeon: Marcene Corning, MD;  Location: [facility];  Service: Orthopedics;  Laterality: Left;   FAMILY HISTORY Family History  Problem Relation Age of Onset    Osteoporosis Mother    Heart disease Mother    Macular degeneration Mother    Heart disease Father    Cancer Father        BRAIN TUMOR   Cancer Brother        BRAIN TUMOR   Diabetes Maternal Aunt    Diabetes Maternal Uncle    Stroke Paternal Grandfather    Colon cancer Neg Hx    Breast cancer Neg Hx    Migraines Neg Hx    SOCIAL HISTORY Social History   Tobacco Use   Smoking status: Never   Smokeless tobacco: Never  Vaping Use   Vaping status: Never Used  Substance Use Topics   Alcohol use: No    Alcohol/week: 0.0 standard drinks of alcohol   Drug use: No       OPHTHALMIC EXAM: Base Eye Exam     Visual Acuity (Snellen - Linear)       Right Left   Dist [place] 20/40 -1 20/30 -2   Dist ph [place] NI NI         Tonometry (Tonopen, 8:03 AM)       Right Left   Pressure 15 17         Pupils       Dark Light Shape React APD   Right 3 2 Round Brisk None   Left 3 2 Round Brisk None         Visual Fields (Counting fingers)       Left Right    Full Full         Extraocular Movement       Right Left    Full, Ortho Full, Ortho         Neuro/Psych     Oriented x3: Yes   Mood/Affect: Normal         Dilation     Both eyes: 1.0% Mydriacyl, 2.5% Phenylephrine @ 8:03 AM           Slit Lamp and Fundus Exam     Slit Lamp Exam       Right Left   Lids/Lashes Dermatochalasis - upper lid Dermatochalasis - upper lid   Conjunctiva/Sclera White and quiet White and quiet   Cornea 1+ Punctate epithelial erosions, fine endo pigment, trace tear film debris 1+ fine Punctate epithelial erosions, fine endo pigment, trace tear film debris   Anterior Chamber Deep and quiet Deep and quiet   Iris Round and dilated Round and dilated   Lens Toric PC IOL with marks at 1200 and 0600, 1+ Posterior capsular opacification nasal side approaching visual axis Toric PC IOL with marks at 1200 and 0600, 1+ Posterior capsular opacification  Anterior Vitreous Vitreous  syneresis, trace pigment, Posterior vitreous detachment, vitreous condensations Vitreous syneresis, Posterior vitreous detachment, vitreous condensations         Fundus Exam       Right Left   Disc mild Pallor, Sharp rim, +cupping, PPA mild Pallor, Sharp rim, +cupping, PPA   C/D Ratio 0.7 0.7   Macula Blunted foveal reflex, central CNV with subretinal heme -- stably resolved -- now with pigment ring, RPE mottling, clumping and atrophy, Drusen, stable improvement in shallow SRF overlying CNV, trace cystic changes temporal mac Flat, Blunted foveal reflex, Drusen, RPE mottling, clumping and atrophy, focal IRH/SRH inferior to fovea -- stably improved, scattered patches of GA, no heme or fluid   Vessels attenuated, mild tortuosity attenuated, mild tortuosity   Periphery Attached, reticular degeneration, paving stone degeneration IT quad, No heme Attached, reticular degeneration, mild inferior paving stone degeneration, No heme           IMAGING AND PROCEDURES  Imaging and Procedures for 06/29/2023  OCT, Retina - OU - Both Eyes       Right Eye Quality was good. Central Foveal Thickness: 354. Progression has worsened. Findings include no SRF, abnormal foveal contour, retinal drusen , subretinal hyper-reflective material, intraretinal fluid, pigment epithelial detachment, outer retinal atrophy (persistent PED, mild interval increase in cystic changes, stable improvement in SRF overlying PED, +ORA).   Left Eye Quality was good. Central Foveal Thickness: 222. Progression has been stable. Findings include normal foveal contour, no IRF, no SRF, retinal drusen , outer retinal atrophy (stable improvement in [facility] and edema inferior fovea and macula).   Notes *Images captured and stored on drive  Diagnosis / Impression:  Ex ARMD OU OD: persistent PED, mild interval increase in cystic changes, stable improvement in SRF overlying PED, +ORA  OS: stable improvement in [facility] and edema inferior fovea  and macula  Clinical management:  See below  Abbreviations: NFP - Normal foveal profile. CME - cystoid macular edema. PED - pigment epithelial detachment. IRF - intraretinal fluid. SRF - subretinal fluid. EZ - ellipsoid zone. ERM - epiretinal membrane. ORA - outer retinal atrophy. ORT - outer retinal tubulation. SRHM - subretinal hyper-reflective material. IRHM - intraretinal hyper-reflective material      Intravitreal Injection, Pharmacologic Agent - OD - Right Eye       Time Out 06/29/2023. 8:41 AM. Confirmed correct patient, procedure, site, and patient consented.   Anesthesia Topical anesthesia was used. Anesthetic medications included Lidocaine 2%, Proparacaine 0.5%.   Procedure Preparation included 5% betadine to ocular surface, eyelid speculum. A (32g) needle was used.   Injection: 2 mg aflibercept 2 MG/0.05ML   Route: Intravitreal, Site: Right Eye   NDC: L6038910, Lot: [id number], Expiration date: 11/06/2024, Waste: 0 mL   Post-op Post injection exam found visual acuity of at least counting fingers. The patient tolerated the procedure well. There were no complications. The patient received written and verbal post procedure care education. Post injection medications were not given.      Intravitreal Injection, Pharmacologic Agent - OS - Left Eye       Time Out 06/29/2023. 8:42 AM. Confirmed correct patient, procedure, site, and patient consented.   Anesthesia Topical anesthesia was used. Anesthetic medications included Lidocaine 2%, Proparacaine 0.5%.   Procedure Preparation included 5% betadine to ocular surface, eyelid speculum. A (33g) needle was used.   Injection: 1.25 mg Bevacizumab 1.25mg /0.21ml   Route: Intravitreal, Site: Left Eye   NDC: P3213405, Lot: [id number], Expiration date: 09/17/2023   Post-op  Post injection exam found visual acuity of at least counting fingers. The patient tolerated the procedure well. There were no complications. The  patient received written and verbal post procedure care education.            ASSESSMENT/PLAN:   ICD-10-CM   1. Exudative age-related macular degeneration of right eye with active choroidal neovascularization (HCC)  H35.3211 OCT, Retina - OU - Both Eyes    Intravitreal Injection, Pharmacologic Agent - OD - Right Eye    aflibercept (EYLEA) SOLN 2 mg    2. Exudative age-related macular degeneration of left eye with active choroidal neovascularization (HCC)  H35.3221 Intravitreal Injection, Pharmacologic Agent - OS - Left Eye    Bevacizumab (AVASTIN) SOLN 1.25 mg    3. Essential hypertension  I10     4. Hypertensive retinopathy of both eyes  H35.033     5. Pseudophakia, both eyes  Z96.1     6. PCO (posterior capsule opacification), bilateral  H26.493      1. Exudative age related macular degeneration, OD - s/p IVA OD #1 (05.06.22), #2 (06.03.22), #3 (07.01.22), #4 (07.29.22), #5 (08.26.22), #6 (09.23.22), #7 (10.21.22), #8 (11.18.22), #9 (12.16.22), #10 (01.20.23) -- IVA resistance - s/p IVE OD #1 (02.24.23) -- sample, #2 (03.24.23), #3 (04.21.23), #4 (05.19.23), #5 (06.16.23), #6 (07.21.23), #7 (09.01.23), #8 (10.27.23), #9 (12.15.23), #10 (02.02.24), #11 (03.29.24), #12 (06.06.24) #13 (07.26.24), #14 (09.13.24), #15 (10.31.24) - OCT shows persistent PED, mild interval increase in cystic changes, stable improvement in SRF overlying PED, +ORA at 7 weeks  - BCVA 20/40 OD -- stable  - recommend IVE OD #16 today, 12.20.24. w/ f/u at 7 wks again  - RBA of procedure discussed, questions answered - see procedure note - IVE informed consent obtained and signed, 02.24.23 (OD)  - f/u 7 wks -- DFE/OCT, possible injection, tx and ext as able  2. Exudative age related macular degeneration, left eye   - early conversion to exudative ARMD -- noted on 01.04.24 - s/p IVA OS #1 (01.04.24), #2 (02.02.24), #3 (03.01.24), #4 (03.29.24), #5 (05.03.24), #6 (06.06.24), #7 (07.26.24), #8 (09.13.24), #9  (10.31.24)  - pt reported new superior paracentral scotoma and +distortion OS -- improved  - BCVA OS 20/30 -- stable - OCT shows stable improvement in [facility] and edema inferior fovea and macula at 7 wks - recommend IVA OS #10 today, 12.20.24 w/ f/u at 7 wks again - pt wishes to be treated with IVA  - RBA of procedure discussed, questions answered - informed consent obtained and signed - see procedure note  - f/u in 7 wks, DFE, OCT, possible injxns  3,4. Hypertensive retinopathy OU - discussed importance of tight BP control -  monitor  5. Pseudophakia OU  - s/p CE/IOL  - IOL in good position, doing well  - monitor   6. PCO OU (OD > OS)  - monitor   Ophthalmic Meds Ordered this visit:  Meds ordered this encounter  Medications   Bevacizumab (AVASTIN) SOLN 1.25 mg   aflibercept (EYLEA) SOLN 2 mg     Return in about 7 weeks (around 08/17/2023) for f/u exu ARMD OU, DFE, OCT.  There are no Patient Instructions on file for this visit.  This document serves as a record of services personally performed by Karie Chimera, MD, PhD. It was created on their behalf by Berlin Hun COT, an ophthalmic technician. The creation of this record is the provider's dictation and/or activities during the visit.    Electronically signed  by: Berlin Hun COT 12.18.24 11:51 AM  This document serves as a record of services personally performed by Karie Chimera, MD, PhD. It was created on their behalf by Glee Arvin. Manson Passey, OA an ophthalmic technician. The creation of this record is the provider's dictation and/or activities during the visit.    Electronically signed by: Glee Arvin. Manson Passey, OA 06/29/23 11:51 AM  Karie Chimera, M.D., Ph.D. Diseases & Surgery of the Retina and Vitreous [facility] 06/29/2023   I have reviewed the above documentation for accuracy and completeness, and I agree with the above. Karie Chimera, M.D., Ph.D. 06/29/23 11:53  AM  Abbreviations: M myopia (nearsighted); A astigmatism; H hyperopia (farsighted); P presbyopia; Mrx spectacle prescription;  CTL contact lenses; OD right eye; OS left eye; OU both eyes  XT exotropia; ET esotropia; PEK punctate epithelial keratitis; PEE punctate epithelial erosions; DES dry eye syndrome; MGD meibomian gland dysfunction; ATs artificial tears; PFAT's preservative free artificial tears; NSC nuclear sclerotic cataract; PSC posterior subcapsular cataract; ERM epi-retinal membrane; PVD posterior vitreous detachment; RD retinal detachment; DM diabetes mellitus; DR diabetic retinopathy; NPDR non-proliferative diabetic retinopathy; PDR proliferative diabetic retinopathy; CSME clinically significant macular edema; DME diabetic macular edema; dbh dot blot hemorrhages; CWS cotton wool spot; POAG primary open angle glaucoma; C/D cup-to-disc ratio; HVF humphrey visual field; GVF goldmann visual field; OCT optical coherence tomography; IOP intraocular pressure; BRVO Branch retinal vein occlusion; CRVO central retinal vein occlusion; CRAO central retinal artery occlusion; BRAO branch retinal artery occlusion; RT retinal tear; SB scleral buckle; PPV pars plana vitrectomy; VH Vitreous hemorrhage; PRP panretinal laser photocoagulation; IVK intravitreal kenalog; VMT vitreomacular traction; MH Macular hole;  NVD neovascularization of the disc; NVE neovascularization elsewhere; AREDS age related eye disease study; ARMD age related macular degeneration; POAG primary open angle glaucoma; EBMD epithelial/anterior basement membrane dystrophy; ACIOL anterior chamber intraocular lens; IOL intraocular lens; PCIOL posterior chamber intraocular lens; Phaco/IOL phacoemulsification with intraocular lens placement; PRK photorefractive keratectomy; LASIK laser assisted in situ keratomileusis; HTN hypertension; DM diabetes mellitus; COPD chronic obstructive pulmonary disease

## 2023-06-29 ENCOUNTER — Ambulatory Visit (INDEPENDENT_AMBULATORY_CARE_PROVIDER_SITE_OTHER): Payer: HMO | Admitting: Ophthalmology

## 2023-06-29 ENCOUNTER — Encounter (INDEPENDENT_AMBULATORY_CARE_PROVIDER_SITE_OTHER): Payer: Self-pay | Admitting: Ophthalmology

## 2023-06-29 DIAGNOSIS — H353211 Exudative age-related macular degeneration, right eye, with active choroidal neovascularization: Secondary | ICD-10-CM

## 2023-06-29 DIAGNOSIS — H26493 Other secondary cataract, bilateral: Secondary | ICD-10-CM | POA: Diagnosis not present

## 2023-06-29 DIAGNOSIS — I10 Essential (primary) hypertension: Secondary | ICD-10-CM | POA: Diagnosis not present

## 2023-06-29 DIAGNOSIS — H353221 Exudative age-related macular degeneration, left eye, with active choroidal neovascularization: Secondary | ICD-10-CM

## 2023-06-29 DIAGNOSIS — Z961 Presence of intraocular lens: Secondary | ICD-10-CM

## 2023-06-29 DIAGNOSIS — H353231 Exudative age-related macular degeneration, bilateral, with active choroidal neovascularization: Secondary | ICD-10-CM

## 2023-06-29 DIAGNOSIS — H35033 Hypertensive retinopathy, bilateral: Secondary | ICD-10-CM | POA: Diagnosis not present

## 2023-06-29 MED ORDER — AFLIBERCEPT 2MG/0.05ML IZ SOLN FOR KALEIDOSCOPE
2.0000 mg | INTRAVITREAL | Status: AC | PRN
  Administered 2023-06-29: 2 mg via INTRAVITREAL

## 2023-06-29 MED ORDER — BEVACIZUMAB CHEMO INJECTION 1.25MG/0.05ML SYRINGE FOR KALEIDOSCOPE
1.2500 mg | INTRAVITREAL | Status: AC | PRN
  Administered 2023-06-29: 1.25 mg via INTRAVITREAL

## 2023-07-02 ENCOUNTER — Other Ambulatory Visit (HOSPITAL_COMMUNITY): Payer: Self-pay

## 2023-07-02 ENCOUNTER — Other Ambulatory Visit (HOSPITAL_BASED_OUTPATIENT_CLINIC_OR_DEPARTMENT_OTHER): Payer: Self-pay

## 2023-07-02 ENCOUNTER — Other Ambulatory Visit: Payer: Self-pay

## 2023-07-02 MED FILL — Levothyroxine Sodium Tab 88 MCG: ORAL | 90 days supply | Qty: 90 | Fill #0 | Status: AC

## 2023-07-03 ENCOUNTER — Other Ambulatory Visit: Payer: Self-pay

## 2023-07-03 ENCOUNTER — Other Ambulatory Visit (HOSPITAL_COMMUNITY): Payer: Self-pay

## 2023-07-05 ENCOUNTER — Other Ambulatory Visit: Payer: Self-pay | Admitting: Family Medicine

## 2023-07-05 ENCOUNTER — Other Ambulatory Visit: Payer: Self-pay

## 2023-07-05 ENCOUNTER — Other Ambulatory Visit (HOSPITAL_COMMUNITY): Payer: Self-pay

## 2023-07-05 DIAGNOSIS — E785 Hyperlipidemia, unspecified: Secondary | ICD-10-CM

## 2023-07-05 DIAGNOSIS — E119 Type 2 diabetes mellitus without complications: Secondary | ICD-10-CM

## 2023-07-06 ENCOUNTER — Other Ambulatory Visit (HOSPITAL_COMMUNITY): Payer: Self-pay

## 2023-07-06 ENCOUNTER — Other Ambulatory Visit: Payer: Self-pay

## 2023-07-09 NOTE — Telephone Encounter (Signed)
Per last office note you wanted patient to come in October for labs. Do not see where that was done. Do you want [place] to call patient for labs? Ok to refill

## 2023-07-10 ENCOUNTER — Other Ambulatory Visit: Payer: Self-pay

## 2023-07-10 MED ORDER — RAMIPRIL 10 MG PO CAPS
10.0000 mg | ORAL_CAPSULE | Freq: Every day | ORAL | 1 refills | Status: DC
  Filled 2023-07-10: qty 90, 90d supply, fill #0

## 2023-07-10 MED ORDER — METFORMIN HCL 500 MG PO TABS
500.0000 mg | ORAL_TABLET | Freq: Two times a day (BID) | ORAL | 1 refills | Status: DC
  Filled 2023-07-10: qty 180, 90d supply, fill #0

## 2023-07-10 MED ORDER — ATORVASTATIN CALCIUM 40 MG PO TABS
40.0000 mg | ORAL_TABLET | Freq: Every day | ORAL | 1 refills | Status: DC
  Filled 2023-07-10: qty 90, 90d supply, fill #0

## 2023-07-10 NOTE — Telephone Encounter (Signed)
 Rx sent.  Please please schedule OV.  We can do labs at the visit. Thanks.

## 2023-07-10 NOTE — Telephone Encounter (Signed)
Scheduled patient 01/10 for OV.

## 2023-07-11 ENCOUNTER — Other Ambulatory Visit: Payer: Self-pay | Admitting: Family Medicine

## 2023-07-11 DIAGNOSIS — E785 Hyperlipidemia, unspecified: Secondary | ICD-10-CM

## 2023-07-11 DIAGNOSIS — E119 Type 2 diabetes mellitus without complications: Secondary | ICD-10-CM

## 2023-07-12 ENCOUNTER — Other Ambulatory Visit: Payer: Self-pay

## 2023-07-12 ENCOUNTER — Other Ambulatory Visit (HOSPITAL_COMMUNITY): Payer: Self-pay

## 2023-07-13 ENCOUNTER — Other Ambulatory Visit (HOSPITAL_BASED_OUTPATIENT_CLINIC_OR_DEPARTMENT_OTHER): Payer: Self-pay

## 2023-07-13 ENCOUNTER — Encounter (INDEPENDENT_AMBULATORY_CARE_PROVIDER_SITE_OTHER): Payer: HMO | Admitting: Ophthalmology

## 2023-07-16 ENCOUNTER — Other Ambulatory Visit (HOSPITAL_COMMUNITY): Payer: Self-pay

## 2023-07-16 ENCOUNTER — Other Ambulatory Visit: Payer: Self-pay

## 2023-07-18 ENCOUNTER — Other Ambulatory Visit: Payer: Self-pay

## 2023-07-18 ENCOUNTER — Other Ambulatory Visit (HOSPITAL_COMMUNITY): Payer: Self-pay

## 2023-07-19 ENCOUNTER — Other Ambulatory Visit (HOSPITAL_COMMUNITY): Payer: Self-pay

## 2023-07-20 ENCOUNTER — Other Ambulatory Visit (HOSPITAL_COMMUNITY): Payer: Self-pay

## 2023-07-20 ENCOUNTER — Ambulatory Visit: Payer: HMO | Admitting: Family Medicine

## 2023-07-23 ENCOUNTER — Other Ambulatory Visit: Payer: Self-pay

## 2023-07-31 ENCOUNTER — Ambulatory Visit: Payer: HMO | Admitting: Family Medicine

## 2023-08-16 NOTE — Progress Notes (Signed)
 [facility] - Clinic Note  08/17/2023     CHIEF COMPLAINT Patient presents for Retina Follow Up  HISTORY OF PRESENT ILLNESS: Dana Sandoval is a 83 y.o. 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  This started 7 weeks ago.  Duration of 7 weeks.  Since onset it is stable.  I, the attending physician,  performed the HPI with the patient and updated documentation appropriately.        Comments   Retina follow up ARMD and IVA OS pt is reporting that vision seems the same she has some floaters denies any flashes last reading 126      Last edited by Valdemar Rogue, MD on 08/17/2023  8:32 AM.    Patient states she has not noticed any change in vision  Referring physician: [name], MD [address] [place],  [place] [address]  HISTORICAL INFORMATION:  Selected notes from the MEDICAL RECORD NUMBER Referred by Dr. CANDIE Gaudy for eval of ret heme OD   CURRENT MEDICATIONS: No current outpatient medications on file. (Ophthalmic Drugs)   No current facility-administered medications for this visit. (Ophthalmic Drugs)   Current Outpatient Medications (Other)  Medication Sig   Accu-Chek Softclix Lancets lancets Use to check blood sugar daily. Dx E11.9   apixaban  (ELIQUIS ) 5 MG TABS tablet Take 1 tablet (5 mg total) by mouth 2 (two) times daily.   atorvastatin  (LIPITOR) 40 MG tablet Take 1 tablet (40 mg total) by mouth daily at 6PM.   Blood Glucose Monitoring Suppl (ACCU-CHEK GUIDE) w/Device KIT Use to check blood sugar daily. Dx E11.9   glucose blood (ACCU-CHEK GUIDE) test strip USE TO CHECK BLOOD SUGAR DAILY   levothyroxine  (SYNTHROID ) 88 MCG tablet Take 1 tablet (88 mcg total) by mouth daily.   metFORMIN  (GLUCOPHAGE ) 500 MG tablet Take 1 tablet (500 mg total) by mouth 2 (two) times daily.   Multiple Vitamins-Minerals (PRESERVISION AREDS 2 PO) Take 1 tablet by mouth 2 (two)  times daily.    ramipril  (ALTACE ) 10 MG capsule Take 1 capsule (10 mg total) by mouth daily.   valACYclovir  (VALTREX ) 500 MG tablet Take 500 mg by mouth daily as needed (fever blister).   vitamin B-12 (CYANOCOBALAMIN ) 1000 MCG tablet Take 1 tablet (1,000 mcg total) by mouth daily.   vitamin C (ASCORBIC ACID) 250 MG tablet Take 500 mg by mouth daily.   VITAMIN D  PO Take 1,000 Units by mouth daily.   No current facility-administered medications for this visit. (Other)   REVIEW OF SYSTEMS: ROS   Positive for: Endocrine, Cardiovascular, Eyes Negative for: Constitutional, Gastrointestinal, Neurological, Skin, Genitourinary, Musculoskeletal, HENT, Respiratory, Psychiatric, Allergic/Imm, Heme/Lymph Last edited by Resa Delon ORN, COT on 08/17/2023  8:06 AM.      ALLERGIES Allergies  Allergen Reactions   Codeine Itching    After 3 days   Dilaudid [Hydromorphone Hcl] Itching    After 3 days   PAST MEDICAL HISTORY Past Medical History:  Diagnosis Date   Aortic stenosis    Arthritis    B12 deficiency    Diabetes mellitus    type 2   Heart murmur    aortic stenosis   Hyperlipidemia    Hypertension    Hypothyroidism    IBS (irritable bowel syndrome)    Macular degeneration    Macular degeneration of right eye  NSVD (normal spontaneous vaginal delivery)    X 4   Pain    INTRASCAPULAR PAIN   SAB (spontaneous abortion)    X 2   Thyroid  disease    HYPOTHYROIDISM   Past Surgical History:  Procedure Laterality Date   ABDOMINAL HYSTERECTOMY  1977   TAH  (FIBROIDS)   CATARACT EXTRACTION Bilateral    DILATION AND CURETTAGE OF UTERUS     DOPPLER ECHOCARDIOGRAPHY  2009   HAD AN ECHOCARDIOGRAM THAT SHOWED MODERATE MITRAL  ANNULAR CALCIFICATION AND A NORMAL EJECTION FRACTION OF 55%   SHOULDER SURGERY     RIGHT SHOULDER SURGERY   THYROIDECTOMY     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   RIGHT   TOTAL KNEE ARTHROPLASTY Left 09/10/2018   Procedure: TOTAL KNEE ARTHROPLASTY;   Surgeon: Sheril Coy, MD;  Location: [facility];  Service: Orthopedics;  Laterality: Left;   FAMILY HISTORY Family History  Problem Relation Age of Onset   Osteoporosis Mother    Heart disease Mother    Macular degeneration Mother    Heart disease Father    Cancer Father        BRAIN TUMOR   Cancer Brother        BRAIN TUMOR   Diabetes Maternal Aunt    Diabetes Maternal Uncle    Stroke Paternal Grandfather    Colon cancer Neg Hx    Breast cancer Neg Hx    Migraines Neg Hx    SOCIAL HISTORY Social History   Tobacco Use   Smoking status: Never   Smokeless tobacco: Never  Vaping Use   Vaping status: Never Used  Substance Use Topics   Alcohol use: No    Alcohol/week: 0.0 standard drinks of alcohol   Drug use: No       OPHTHALMIC EXAM: Base Eye Exam     Visual Acuity (Snellen - Linear)       Right Left   Dist [place] 20/40 20/40 -1   Dist ph [place] NI NI         Tonometry (Tonopen, 8:10 AM)       Right Left   Pressure 15 18         Pupils       Pupils Dark Light Shape React APD   Right PERRL 3 2 Round Brisk None   Left PERRL 3 2 Round Brisk None         Visual Fields       Left Right    Full Full         Extraocular Movement       Right Left    Full, Ortho Full, Ortho         Neuro/Psych     Oriented x3: Yes   Mood/Affect: Normal         Dilation     Both eyes: 2.5% Phenylephrine  @ 8:10 AM           Slit Lamp and Fundus Exam     Slit Lamp Exam       Right Left   Lids/Lashes Dermatochalasis - upper lid Dermatochalasis - upper lid   Conjunctiva/Sclera White and quiet White and quiet   Cornea 1+ Punctate epithelial erosions, fine endo pigment, trace tear film debris 1+ fine Punctate epithelial erosions, fine endo pigment, trace tear film debris   Anterior Chamber Deep and quiet Deep and quiet   Iris Round and dilated Round and dilated   Lens Toric PC IOL with marks at  1200 and 0600, 1+ Posterior capsular opacification nasal  side approaching visual axis Toric PC IOL with marks at 1200 and 0600, 1+ Posterior capsular opacification   Anterior Vitreous Vitreous syneresis, trace pigment, Posterior vitreous detachment, vitreous condensations Vitreous syneresis, Posterior vitreous detachment, vitreous condensations         Fundus Exam       Right Left   Disc mild Pallor, Sharp rim, +cupping, PPA mild Pallor, Sharp rim, +cupping, PPA   C/D Ratio 0.7 0.7   Macula Blunted foveal reflex, central CNV with subretinal heme -- stably resolved -- now with pigment ring, RPE mottling, clumping and atrophy, Drusen, stable improvement in shallow SRF overlying CNV, trace cystic changes temporal mac Flat, Blunted foveal reflex, Drusen, RPE mottling, clumping and atrophy, focal IRH/SRH inferior to fovea -- stably improved, scattered patches of GA, no heme or fluid   Vessels attenuated, mild tortuosity attenuated, mild tortuosity   Periphery Attached, reticular degeneration, paving stone degeneration IT quad, No heme Attached, reticular degeneration, mild inferior paving stone degeneration, No heme           IMAGING AND PROCEDURES  Imaging and Procedures for 08/17/2023  OCT, Retina - OU - Both Eyes       Right Eye Quality was good. Central Foveal Thickness: 336. Progression has improved. Findings include no SRF, abnormal foveal contour, retinal drusen , subretinal hyper-reflective material, intraretinal fluid, pigment epithelial detachment, outer retinal atrophy (persistent PED, persistent IRF / cystic changes -- slightly improved, stable improvement in SRF overlying PED, +ORA).   Left Eye Quality was good. Central Foveal Thickness: 222. Progression has been stable. Findings include normal foveal contour, no IRF, no SRF, retinal drusen , outer retinal atrophy (stable improvement in [facility] and edema inferior fovea and macula).   Notes *Images captured and stored on drive  Diagnosis / Impression:  Ex ARMD OU OD: persistent PED,  persistent IRF / cystic changes -- slightly improved, stable improvement in SRF overlying PED, +ORA OS: stable improvement in [facility] and edema inferior fovea and macula  Clinical management:  See below  Abbreviations: NFP - Normal foveal profile. CME - cystoid macular edema. PED - pigment epithelial detachment. IRF - intraretinal fluid. SRF - subretinal fluid. EZ - ellipsoid zone. ERM - epiretinal membrane. ORA - outer retinal atrophy. ORT - outer retinal tubulation. SRHM - subretinal hyper-reflective material. IRHM - intraretinal hyper-reflective material      Intravitreal Injection, Pharmacologic Agent - OD - Right Eye       Time Out 08/17/2023. 8:58 AM. Confirmed correct patient, procedure, site, and patient consented.   Anesthesia Topical anesthesia was used. Anesthetic medications included Lidocaine  2%, Proparacaine 0.5%.   Procedure Preparation included 5% betadine to ocular surface, eyelid speculum. A (32g) needle was used.   Injection: 6 mg faricimab -svoa 6 MG/0.05ML (Patient supplied)   Route: Intravitreal, Site: Right Eye   NDC: 49757-903-98, Lot: [id number], Expiration date: 03/08/2025, Waste: 0 mL   Post-op Post injection exam found visual acuity of at least counting fingers. The patient tolerated the procedure well. There were no complications. The patient received written and verbal post procedure care education. Post injection medications were not given.   Notes **SAMPLE MEDICATION ADMINISTERED**      Intravitreal Injection, Pharmacologic Agent - OS - Left Eye       Time Out 08/17/2023. 8:58 AM. Confirmed correct patient, procedure, site, and patient consented.   Anesthesia Topical anesthesia was used. Anesthetic medications included Lidocaine  2%, Proparacaine 0.5%.   Procedure Preparation included  5% betadine to ocular surface, eyelid speculum. A supplied (33g) needle was used.   Injection: 1.25 mg Bevacizumab  1.25mg /0.52ml   Route: Intravitreal, Site: Left  Eye   NDC: H525437, Lot: [id number], Expiration date: 09/22/2023   Post-op Post injection exam found visual acuity of at least counting fingers. The patient tolerated the procedure well. There were no complications. The patient received written and verbal post procedure care education.            ASSESSMENT/PLAN:   ICD-10-CM   1. Exudative age-related macular degeneration of right eye with active choroidal neovascularization (HCC)  H35.3211 OCT, Retina - OU - Both Eyes    Intravitreal Injection, Pharmacologic Agent - OD - Right Eye    faricimab -svoa (VABYSMO ) 6mg /0.74mL intravitreal injection    2. Exudative age-related macular degeneration of left eye with active choroidal neovascularization (HCC)  H35.3221 Intravitreal Injection, Pharmacologic Agent - OS - Left Eye    Bevacizumab  (AVASTIN ) SOLN 1.25 mg    3. Essential hypertension  I10     4. Hypertensive retinopathy of both eyes  H35.033     5. Pseudophakia, both eyes  Z96.1     6. PCO (posterior capsule opacification), bilateral  H26.493      1. Exudative age related macular degeneration, OD - s/p IVA OD #1 (05.06.22), #2 (06.03.22), #3 (07.01.22), #4 (07.29.22), #5 (08.26.22), #6 (09.23.22), #7 (10.21.22), #8 (11.18.22), #9 (12.16.22), #10 (01.20.23) -- IVA resistance - s/p IVE OD #1 (02.24.23) -- sample, #2 (03.24.23), #3 (04.21.23), #4 (05.19.23), #5 (06.16.23), #6 (07.21.23), #7 (09.01.23), #8 (10.27.23), #9 (12.15.23), #10 (02.02.24), #11 (03.29.24), #12 (06.06.24) #13 (07.26.24), #14 (09.13.24), #15 (10.31.24), #16 (12.20.24) - OCT shows persistent PED, persistent IRF / cystic changes -- slightly improved, stable improvement in SRF overlying PED, +ORA at 7 weeks  - BCVA 20/40 OD -- stable **discussed decreased efficacy / resistance to Eylea  and potential benefit of switching medication**   - recommend switching to IVV OD #1 (SAMPLE) today, 02.07.25. w/ f/u decreased to 6 wks  - RBA of procedure discussed, questions  answered - see procedure note - IVE informed consent obtained and signed, 02.24.23 (OD) - IVV informed consent obtained and signed, 02.07.25 (OD)  - f/u 6 wks -- DFE/OCT, possible injection  2. Exudative age related macular degeneration, left eye   - early conversion to exudative ARMD -- noted on 01.04.24 - s/p IVA OS #1 (01.04.24), #2 (02.02.24), #3 (03.01.24), #4 (03.29.24), #5 (05.03.24), #6 (06.06.24), #7 (07.26.24), #8 (09.13.24), #9 (10.31.24)  - pt reported new superior paracentral scotoma and +distortion OS -- improved  - BCVA OS 20/40 from 20/30 - OCT shows stable improvement in [facility] and edema inferior fovea and macula at 7 wks - recommend IVA OS #10 today, 12.20.24 w/ f/u decreased to 6 wks  - pt wishes to be treated with IVA  - RBA of procedure discussed, questions answered - informed consent obtained and signed - see procedure note  - f/u in 6 wks, DFE, OCT, possible injxns  3,4. Hypertensive retinopathy OU - discussed importance of tight BP control -  monitor  5. Pseudophakia OU  - s/p CE/IOL  - IOL in good position, doing well  - monitor   6. PCO OU (OD > OS)  - monitor   Ophthalmic Meds Ordered this visit:  Meds ordered this encounter  Medications   Bevacizumab  (AVASTIN ) SOLN 1.25 mg   faricimab -svoa (VABYSMO ) 6mg /0.2mL intravitreal injection     Return in about 6 weeks (around 09/28/2023) for f/u  exu ARMD OU, DFE, OCT, Possible Injxn.  There are no Patient Instructions on file for this visit.  Electronically signed by: Delon Newness COT 02.06.25 12:32 PM  This document serves as a record of services personally performed by Redell JUDITHANN Hans, MD, PhD. It was created on their behalf by Alan PARAS. Delores, OA an ophthalmic technician. The creation of this record is the provider's dictation and/or activities during the visit.    Electronically signed by: Alan PARAS. Delores, OA 08/17/23 12:32 PM  Redell JUDITHANN Hans, M.D., Ph.D. Diseases & Surgery of the Retina  and Vitreous [facility] 08/17/2023   I have reviewed the above documentation for accuracy and completeness, and I agree with the above. Redell JUDITHANN Hans, M.D., Ph.D. 08/17/23 12:34 PM   Abbreviations: M myopia (nearsighted); A astigmatism; H hyperopia (farsighted); P presbyopia; Mrx spectacle prescription;  CTL contact lenses; OD right eye; OS left eye; OU both eyes  XT exotropia; ET esotropia; PEK punctate epithelial keratitis; PEE punctate epithelial erosions; DES dry eye syndrome; MGD meibomian gland dysfunction; ATs artificial tears; PFAT's preservative free artificial tears; NSC nuclear sclerotic cataract; PSC posterior subcapsular cataract; ERM epi-retinal membrane; PVD posterior vitreous detachment; RD retinal detachment; DM diabetes mellitus; DR diabetic retinopathy; NPDR non-proliferative diabetic retinopathy; PDR proliferative diabetic retinopathy; CSME clinically significant macular edema; DME diabetic macular edema; dbh dot blot hemorrhages; CWS cotton wool spot; POAG primary open angle glaucoma; C/D cup-to-disc ratio; HVF humphrey visual field; GVF goldmann visual field; OCT optical coherence tomography; IOP intraocular pressure; BRVO Branch retinal vein occlusion; CRVO central retinal vein occlusion; CRAO central retinal artery occlusion; BRAO branch retinal artery occlusion; RT retinal tear; SB scleral buckle; PPV pars plana vitrectomy; VH Vitreous hemorrhage; PRP panretinal laser photocoagulation; IVK intravitreal kenalog; VMT vitreomacular traction; MH Macular hole;  NVD neovascularization of the disc; NVE neovascularization elsewhere; AREDS age related eye disease study; ARMD age related macular degeneration; POAG primary open angle glaucoma; EBMD epithelial/anterior basement membrane dystrophy; ACIOL anterior chamber intraocular lens; IOL intraocular lens; PCIOL posterior chamber intraocular lens; Phaco/IOL phacoemulsification with intraocular lens placement; PRK  photorefractive keratectomy; LASIK laser assisted in situ keratomileusis; HTN hypertension; DM diabetes mellitus; COPD chronic obstructive pulmonary disease

## 2023-08-17 ENCOUNTER — Ambulatory Visit (INDEPENDENT_AMBULATORY_CARE_PROVIDER_SITE_OTHER): Payer: HMO | Admitting: Ophthalmology

## 2023-08-17 ENCOUNTER — Encounter (INDEPENDENT_AMBULATORY_CARE_PROVIDER_SITE_OTHER): Payer: Self-pay | Admitting: Ophthalmology

## 2023-08-17 DIAGNOSIS — I10 Essential (primary) hypertension: Secondary | ICD-10-CM | POA: Diagnosis not present

## 2023-08-17 DIAGNOSIS — H35033 Hypertensive retinopathy, bilateral: Secondary | ICD-10-CM | POA: Diagnosis not present

## 2023-08-17 DIAGNOSIS — H353221 Exudative age-related macular degeneration, left eye, with active choroidal neovascularization: Secondary | ICD-10-CM

## 2023-08-17 DIAGNOSIS — H353211 Exudative age-related macular degeneration, right eye, with active choroidal neovascularization: Secondary | ICD-10-CM

## 2023-08-17 DIAGNOSIS — H353231 Exudative age-related macular degeneration, bilateral, with active choroidal neovascularization: Secondary | ICD-10-CM | POA: Diagnosis not present

## 2023-08-17 DIAGNOSIS — H26493 Other secondary cataract, bilateral: Secondary | ICD-10-CM | POA: Diagnosis not present

## 2023-08-17 DIAGNOSIS — Z961 Presence of intraocular lens: Secondary | ICD-10-CM | POA: Diagnosis not present

## 2023-08-17 MED ORDER — BEVACIZUMAB CHEMO INJECTION 1.25MG/0.05ML SYRINGE FOR KALEIDOSCOPE
1.2500 mg | INTRAVITREAL | Status: AC | PRN
  Administered 2023-08-17: 1.25 mg via INTRAVITREAL

## 2023-08-17 MED ORDER — FARICIMAB-SVOA 6 MG/0.05ML IZ SOLN
6.0000 mg | INTRAVITREAL | Status: AC | PRN
  Administered 2023-08-17: 6 mg via INTRAVITREAL

## 2023-09-26 NOTE — Progress Notes (Signed)
 [facility] - Clinic Note  09/28/2023     CHIEF COMPLAINT Patient presents for Retina Follow Up  HISTORY OF PRESENT ILLNESS: Dana Sandoval is a 83 y.o. 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  Severity is moderate.  Duration of 6 weeks.  Since onset it is stable.  I, the attending physician,  performed the HPI with the patient and updated documentation appropriately.        Comments   6 week Retina eval. Patient states vision is the same      Last edited by Rennis Chris, MD on 09/28/2023  5:04 PM.    Patient states she sees glitter in the morning when heading to the greenhouse. She also states that she feels the overall vision is not as sharp.   Referring physician: Olivia Canter, MD [address] [place],  [place] [address]  HISTORICAL INFORMATION:  Selected notes from the MEDICAL RECORD NUMBER Referred by Dr. Laruth Bouchard for eval of ret heme OD   CURRENT MEDICATIONS: No current outpatient medications on file. (Ophthalmic Drugs)   No current facility-administered medications for this visit. (Ophthalmic Drugs)   Current Outpatient Medications (Other)  Medication Sig   Accu-Chek Softclix Lancets lancets Use to check blood sugar daily. Dx E11.9   apixaban (ELIQUIS) 5 MG TABS tablet Take 1 tablet (5 mg total) by mouth 2 (two) times daily.   atorvastatin (LIPITOR) 40 MG tablet Take 1 tablet (40 mg total) by mouth daily at 6PM.   Blood Glucose Monitoring Suppl (ACCU-CHEK GUIDE) w/Device KIT Use to check blood sugar daily. Dx E11.9   glucose blood (ACCU-CHEK GUIDE) test strip USE TO CHECK BLOOD SUGAR DAILY   levothyroxine (SYNTHROID) 88 MCG tablet Take 1 tablet (88 mcg total) by mouth daily.   metFORMIN (GLUCOPHAGE) 500 MG tablet Take 1 tablet (500 mg total) by mouth 2 (two) times daily.   Multiple Vitamins-Minerals (PRESERVISION AREDS 2 PO) Take 1 tablet by mouth  2 (two) times daily.    ramipril (ALTACE) 10 MG capsule Take 1 capsule (10 mg total) by mouth daily.   valACYclovir (VALTREX) 500 MG tablet Take 500 mg by mouth daily as needed (fever blister).   vitamin B-12 (CYANOCOBALAMIN) 1000 MCG tablet Take 1 tablet (1,000 mcg total) by mouth daily.   vitamin C (ASCORBIC ACID) 250 MG tablet Take 500 mg by mouth daily.   VITAMIN D PO Take 1,000 Units by mouth daily.   No current facility-administered medications for this visit. (Other)   REVIEW OF SYSTEMS: ROS   Positive for: Endocrine, Cardiovascular, Eyes Negative for: Constitutional, Gastrointestinal, Neurological, Skin, Genitourinary, Musculoskeletal, HENT, Respiratory, Psychiatric, Allergic/Imm, Heme/Lymph Last edited by Lana Fish, COT on 09/28/2023  8:25 AM.     ALLERGIES Allergies  Allergen Reactions   Codeine Itching    After 3 days   Dilaudid [Hydromorphone Hcl] Itching    After 3 days   PAST MEDICAL HISTORY Past Medical History:  Diagnosis Date   Aortic stenosis    Arthritis    B12 deficiency    Diabetes mellitus    type 2   Heart murmur    aortic stenosis   Hyperlipidemia    Hypertension    Hypothyroidism    IBS (irritable bowel syndrome)    Macular degeneration    Macular degeneration of right eye  NSVD (normal spontaneous vaginal delivery)    X 4   Pain    INTRASCAPULAR PAIN   SAB (spontaneous abortion)    X 2   Thyroid disease    HYPOTHYROIDISM   Past Surgical History:  Procedure Laterality Date   ABDOMINAL HYSTERECTOMY  1977   TAH  (FIBROIDS)   CATARACT EXTRACTION Bilateral    DILATION AND CURETTAGE OF UTERUS     DOPPLER ECHOCARDIOGRAPHY  2009   HAD AN ECHOCARDIOGRAM THAT SHOWED MODERATE MITRAL  ANNULAR CALCIFICATION AND A NORMAL EJECTION FRACTION OF 55%   SHOULDER SURGERY     RIGHT SHOULDER SURGERY   THYROIDECTOMY     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   RIGHT   TOTAL KNEE ARTHROPLASTY Left 09/10/2018   Procedure: TOTAL KNEE ARTHROPLASTY;   Surgeon: Marcene Corning, MD;  Location: [facility];  Service: Orthopedics;  Laterality: Left;   FAMILY HISTORY Family History  Problem Relation Age of Onset   Osteoporosis Mother    Heart disease Mother    Macular degeneration Mother    Heart disease Father    Cancer Father        BRAIN TUMOR   Cancer Brother        BRAIN TUMOR   Diabetes Maternal Aunt    Diabetes Maternal Uncle    Stroke Paternal Grandfather    Colon cancer Neg Hx    Breast cancer Neg Hx    Migraines Neg Hx    SOCIAL HISTORY Social History   Tobacco Use   Smoking status: Never   Smokeless tobacco: Never  Vaping Use   Vaping status: Never Used  Substance Use Topics   Alcohol use: No    Alcohol/week: 0.0 standard drinks of alcohol   Drug use: No       OPHTHALMIC EXAM: Base Eye Exam     Visual Acuity (Snellen - Linear)       Right Left   Dist [place] 20/40 -2 20/30 -2   Dist ph [place] 20/NI 20/NI         Tonometry (Tonopen, 8:28 AM)       Right Left   Pressure 15 16         Pupils       Dark Light Shape React APD   Right 3 2 Round Brisk None   Left 3 2 Round Brisk None         Visual Fields (Counting fingers)       Left Right    Full Full         Extraocular Movement       Right Left    Full, Ortho Full, Ortho         Neuro/Psych     Oriented x3: Yes   Mood/Affect: Normal         Dilation     Both eyes: 1.0% Mydriacyl, 2.5% Phenylephrine @ 8:28 AM           Slit Lamp and Fundus Exam     Slit Lamp Exam       Right Left   Lids/Lashes Dermatochalasis - upper lid Dermatochalasis - upper lid   Conjunctiva/Sclera White and quiet White and quiet   Cornea 1+ Punctate epithelial erosions, fine endo pigment, trace tear film debris 1+ fine Punctate epithelial erosions, fine endo pigment, trace tear film debris   Anterior Chamber Deep and quiet Deep and quiet   Iris Round and dilated Round and dilated   Lens Toric PC IOL with  marks at 1200 and 0600, 1+ Posterior  capsular opacification nasal side approaching visual axis Toric PC IOL with marks at 1200 and 0600, 1+ Posterior capsular opacification   Anterior Vitreous Vitreous syneresis, trace pigment, Posterior vitreous detachment, vitreous condensations Vitreous syneresis, Posterior vitreous detachment, vitreous condensations         Fundus Exam       Right Left   Disc mild Pallor, Sharp rim, +cupping, PPA mild Pallor, Sharp rim, +cupping, PPA   C/D Ratio 0.7 0.7   Macula Blunted foveal reflex, central CNV with subretinal heme -- stably resolved -- now with pigment ring, RPE mottling, clumping and atrophy, Drusen, stable improvement in shallow SRF overlying CNV, trace cystic changes temporal mac- slightly improved Flat, Blunted foveal reflex, Drusen, RPE mottling, clumping and atrophy, focal IRH/SRH inferior to fovea -- stably improved, scattered patches of GA, no heme or fluid   Vessels attenuated, mild tortuosity attenuated, mild tortuosity   Periphery Attached, reticular degeneration, paving stone degeneration IT quad, No heme Attached, reticular degeneration, mild inferior paving stone degeneration, No heme           IMAGING AND PROCEDURES  Imaging and Procedures for 09/28/2023  OCT, Retina - OU - Both Eyes       Right Eye Quality was good. Central Foveal Thickness: 340. Progression has improved. Findings include no SRF, abnormal foveal contour, retinal drusen , subretinal hyper-reflective material, intraretinal fluid, pigment epithelial detachment, outer retinal atrophy (persistent PED, persistent IRF / cystic changes -- slightly improved, stable improvement in SRF overlying PED, +ORA).   Left Eye Quality was good. Central Foveal Thickness: 220. Progression has been stable. Findings include normal foveal contour, no IRF, no SRF, retinal drusen , outer retinal atrophy (stable improvement in [facility] and edema inferior fovea and macula).   Notes *Images captured and stored on drive  Diagnosis  / Impression:  Ex ARMD OU OD: persistent PED, persistent IRF / cystic changes -- slightly improved, stable improvement in SRF overlying PED, +ORA OS: stable improvement in [facility] and edema inferior fovea and macula  Clinical management:  See below  Abbreviations: NFP - Normal foveal profile. CME - cystoid macular edema. PED - pigment epithelial detachment. IRF - intraretinal fluid. SRF - subretinal fluid. EZ - ellipsoid zone. ERM - epiretinal membrane. ORA - outer retinal atrophy. ORT - outer retinal tubulation. SRHM - subretinal hyper-reflective material. IRHM - intraretinal hyper-reflective material      Intravitreal Injection, Pharmacologic Agent - OD - Right Eye       Time Out 09/28/2023. 8:45 AM. Confirmed correct patient, procedure, site, and patient consented.   Anesthesia Topical anesthesia was used. Anesthetic medications included Lidocaine 2%, Proparacaine 0.5%.   Procedure Preparation included 5% betadine to ocular surface, eyelid speculum. A (32g) needle was used.   Injection: 6 mg faricimab-svoa 6 MG/0.05ML (Patient supplied)   Route: Intravitreal, Site: Right Eye   NDC: 78295-621-30, Lot: [id number], Expiration date: 03/08/2025, Waste: 0 mL   Post-op Post injection exam found visual acuity of at least counting fingers. The patient tolerated the procedure well. There were no complications. The patient received written and verbal post procedure care education. Post injection medications were not given.   Notes **SAMPLE MEDICATION ADMINISTERED**            ASSESSMENT/PLAN:   ICD-10-CM   1. Exudative age-related macular degeneration of right eye with active choroidal neovascularization (HCC)  H35.3211 OCT, Retina - OU - Both Eyes    Intravitreal Injection, Pharmacologic Agent - OD -  Right Eye    faricimab-svoa (VABYSMO) 6mg /0.41mL intravitreal injection    2. Exudative age-related macular degeneration of left eye with active choroidal neovascularization (HCC)   H35.3221     3. Essential hypertension  I10     4. Hypertensive retinopathy of both eyes  H35.033     5. Pseudophakia, both eyes  Z96.1     6. PCO (posterior capsule opacification), bilateral  H26.493       1. Exudative age related macular degeneration, OD - s/p IVA OD #1 (05.06.22), #2 (06.03.22), #3 (07.01.22), #4 (07.29.22), #5 (08.26.22), #6 (09.23.22), #7 (10.21.22), #8 (11.18.22), #9 (12.16.22), #10 (01.20.23) -- IVA resistance ===================================== - s/p IVE OD #1 (02.24.23) -- sample, #2 (03.24.23), #3 (04.21.23), #4 (05.19.23), #5 (06.16.23), #6 (07.21.23), #7 (09.01.23), #8 (10.27.23), #9 (12.15.23), #10 (02.02.24), #11 (03.29.24), #12 (06.06.24) #13 (07.26.24), #14 (09.13.24), #15 (10.31.24), #16 (12.20.24) -- IVE resistance ====================================== - s/p IVV OD #1 (02.07.25) - OCT shows persistent PED, persistent IRF / cystic changes -- slightly improved, stable improvement in SRF overlying PED, +ORA at 6 weeks  - BCVA 20/40 OD -- stable - recommend IVV OD #2 (SAMPLE) today, 03.21.25. w/ f/u in 6 wks  - RBA of procedure discussed, questions answered - see procedure note - IVE informed consent obtained and signed, 02.24.23 (OD) - IVV informed consent obtained and signed, 02.07.25 (OD)  - f/u 6 wks -- DFE/OCT, possible injection  2. Exudative age related macular degeneration, left eye   - early conversion to exudative ARMD -- noted on 01.04.24 - s/p IVA OS #1 (01.04.24), #2 (02.02.24), #3 (03.01.24), #4 (03.29.24), #5 (05.03.24), #6 (06.06.24), #7 (07.26.24), #8 (09.13.24), #9 (10.31.24), #10 (12.20.24), #11 (02.07.25)  - BCVA OS 20/30 from 20/40 - OCT shows stable improvement in [facility] and edema inferior fovea and macula at 7 wks - recommend holding IVA OS today -- pt in agreement  - f/u in 6 wks, DFE, OCT, possible injxns  3,4. Hypertensive retinopathy OU - discussed importance of tight BP control -  monitor  5. Pseudophakia OU  - s/p  CE/IOL  - IOL in good position, doing well  - monitor   6. PCO OU (OD > OS)  - monitor   Ophthalmic Meds Ordered this visit:  Meds ordered this encounter  Medications   faricimab-svoa (VABYSMO) 6mg /0.79mL intravitreal injection     Return in about 6 weeks (around 11/09/2023) for f/u Ex. AMD OU, DFE, OCT, Possible, IVV, OD, IVA, OS.  There are no Patient Instructions on file for this visit.  This document serves as a record of services personally performed by Karie Chimera, MD, PhD. It was created on their behalf by Berlin Hun COT, an ophthalmic technician. The creation of this record is the provider's dictation and/or activities during the visit.    Electronically signed by: Berlin Hun COT 03.19.25  5:08 PM  This document serves as a record of services personally performed by Karie Chimera, MD, PhD. It was created on their behalf by Charlette Caffey, COT an ophthalmic technician. The creation of this record is the provider's dictation and/or activities during the visit.    Electronically signed by:  Charlette Caffey, COT  09/28/23 5:08 PM  Karie Chimera, M.D., Ph.D. Diseases & Surgery of the Retina and Vitreous [facility] 09/28/2023   I have reviewed the above documentation for accuracy and completeness, and I agree with the above. Karie Chimera, M.D., Ph.D. 09/28/23 5:13 PM  Abbreviations:  M myopia (nearsighted); A astigmatism; H hyperopia (farsighted); P presbyopia; Mrx spectacle prescription;  CTL contact lenses; OD right eye; OS left eye; OU both eyes  XT exotropia; ET esotropia; PEK punctate epithelial keratitis; PEE punctate epithelial erosions; DES dry eye syndrome; MGD meibomian gland dysfunction; ATs artificial tears; PFAT's preservative free artificial tears; NSC nuclear sclerotic cataract; PSC posterior subcapsular cataract; ERM epi-retinal membrane; PVD posterior vitreous detachment; RD retinal detachment; DM diabetes  mellitus; DR diabetic retinopathy; NPDR non-proliferative diabetic retinopathy; PDR proliferative diabetic retinopathy; CSME clinically significant macular edema; DME diabetic macular edema; dbh dot blot hemorrhages; CWS cotton wool spot; POAG primary open angle glaucoma; C/D cup-to-disc ratio; HVF humphrey visual field; GVF goldmann visual field; OCT optical coherence tomography; IOP intraocular pressure; BRVO Branch retinal vein occlusion; CRVO central retinal vein occlusion; CRAO central retinal artery occlusion; BRAO branch retinal artery occlusion; RT retinal tear; SB scleral buckle; PPV pars plana vitrectomy; VH Vitreous hemorrhage; PRP panretinal laser photocoagulation; IVK intravitreal kenalog; VMT vitreomacular traction; MH Macular hole;  NVD neovascularization of the disc; NVE neovascularization elsewhere; AREDS age related eye disease study; ARMD age related macular degeneration; POAG primary open angle glaucoma; EBMD epithelial/anterior basement membrane dystrophy; ACIOL anterior chamber intraocular lens; IOL intraocular lens; PCIOL posterior chamber intraocular lens; Phaco/IOL phacoemulsification with intraocular lens placement; PRK photorefractive keratectomy; LASIK laser assisted in situ keratomileusis; HTN hypertension; DM diabetes mellitus; COPD chronic obstructive pulmonary disease

## 2023-09-28 ENCOUNTER — Ambulatory Visit (INDEPENDENT_AMBULATORY_CARE_PROVIDER_SITE_OTHER): Payer: HMO | Admitting: Ophthalmology

## 2023-09-28 ENCOUNTER — Encounter (INDEPENDENT_AMBULATORY_CARE_PROVIDER_SITE_OTHER): Payer: Self-pay | Admitting: Ophthalmology

## 2023-09-28 DIAGNOSIS — H353211 Exudative age-related macular degeneration, right eye, with active choroidal neovascularization: Secondary | ICD-10-CM

## 2023-09-28 DIAGNOSIS — H26493 Other secondary cataract, bilateral: Secondary | ICD-10-CM | POA: Diagnosis not present

## 2023-09-28 DIAGNOSIS — Z961 Presence of intraocular lens: Secondary | ICD-10-CM

## 2023-09-28 DIAGNOSIS — H35033 Hypertensive retinopathy, bilateral: Secondary | ICD-10-CM

## 2023-09-28 DIAGNOSIS — I10 Essential (primary) hypertension: Secondary | ICD-10-CM | POA: Diagnosis not present

## 2023-09-28 DIAGNOSIS — H353221 Exudative age-related macular degeneration, left eye, with active choroidal neovascularization: Secondary | ICD-10-CM

## 2023-09-28 DIAGNOSIS — H353231 Exudative age-related macular degeneration, bilateral, with active choroidal neovascularization: Secondary | ICD-10-CM

## 2023-09-28 LAB — HM DIABETES EYE EXAM

## 2023-09-28 MED ORDER — FARICIMAB-SVOA 6 MG/0.05ML IZ SOSY
6.0000 mg | PREFILLED_SYRINGE | INTRAVITREAL | Status: AC | PRN
  Administered 2023-09-28: 6 mg via INTRAVITREAL

## 2023-10-01 ENCOUNTER — Other Ambulatory Visit (HOSPITAL_COMMUNITY): Payer: HMO

## 2023-10-01 ENCOUNTER — Ambulatory Visit (INDEPENDENT_AMBULATORY_CARE_PROVIDER_SITE_OTHER): Payer: HMO

## 2023-10-01 VITALS — BP 128/66 | Ht 63.0 in | Wt 170.0 lb

## 2023-10-01 DIAGNOSIS — Z5320 Procedure and treatment not carried out because of patient's decision for unspecified reasons: Secondary | ICD-10-CM

## 2023-10-01 DIAGNOSIS — Z2821 Immunization not carried out because of patient refusal: Secondary | ICD-10-CM

## 2023-10-01 DIAGNOSIS — Z0000 Encounter for general adult medical examination without abnormal findings: Secondary | ICD-10-CM

## 2023-10-01 NOTE — Patient Instructions (Signed)
 Dana Sandoval , Thank you for taking time to come for your Medicare Wellness Visit. I appreciate your ongoing commitment to your health goals. Please review the following plan we discussed and let me know if I can assist you in the future.   Referrals/Orders/Follow-Ups/Clinician Recommendations: Patient declinned vaccine at this time. Patient reminded of Foot exam , Diabetic kidney, and A1c due.  This is a list of the screening recommended for you and due dates:  Health Maintenance  Topic Date Due   Zoster (Shingles) Vaccine (1 of 2) Never done   Pap Smear  06/22/2018   Complete foot exam   09/16/2022   COVID-19 Vaccine (4 - 2024-25 season) 03/11/2023   Hemoglobin A1C  03/25/2023   Eye exam for diabetics  07/14/2023   Yearly kidney function blood test for diabetes  09/22/2023   Yearly kidney health urinalysis for diabetes  09/22/2023   Mammogram  06/20/2024   Medicare Annual Wellness Visit  09/30/2024   DTaP/Tdap/Td vaccine (3 - Td or Tdap) 05/01/2033   Pneumonia Vaccine  Completed   Flu Shot  Completed   DEXA scan (bone density measurement)  Completed   HPV Vaccine  Aged Out    Advanced directives: (Copy Requested) Please bring a copy of your health care power of attorney and living will to the office to be added to your chart at your convenience. You can mail to [facility] [address] [place], [place] [address] or email to [email or web address]  Next Medicare Annual Wellness Visit scheduled for next year: Yes

## 2023-10-01 NOTE — Progress Notes (Signed)
 Because this visit was a virtual/telehealth visit,  certain criteria was not obtained, such a blood pressure, CBG if applicable, and timed get up and go. Any medications not marked as "taking" were not mentioned during the medication reconciliation part of the visit. Any vitals not documented were not able to be obtained due to this being a telehealth visit or patient was unable to self-report a recent blood pressure reading due to a lack of equipment at home via telehealth. Vitals that have been documented are verbally provided by the patient.   Subjective:   Dana Sandoval is a 83 y.o. who presents for a Medicare Wellness preventive visit.  Visit Complete: Virtual I connected with  Shataria B Janco on 10/01/23 by a audio enabled telemedicine application and verified that I am speaking with the correct person using two identifiers.  Patient Location: Home  Provider Location: Home Office  I discussed the limitations of evaluation and management by telemedicine. The patient expressed understanding and agreed to proceed.  Vital Signs: Because this visit was a virtual/telehealth visit, some criteria may be missing or patient reported. Any vitals not documented were not able to be obtained and vitals that have been documented are patient reported.  VideoDeclined- This patient declined Librarian, academic. Therefore the visit was completed with audio only.  Persons Participating in Visit: Patient.  AWV Questionnaire: No: Patient Medicare AWV questionnaire was not completed prior to this visit.  Cardiac Risk Factors include: advanced age (>36men, >56 women);diabetes mellitus;Other (see comment);dyslipidemia;obesity (BMI >30kg/m2);hypertension, Risk factor comments: atrial flutters     Objective:    Today's Vitals   10/01/23 0926  BP: 128/66  Weight: 170 lb (77.1 kg)  Height: 5\' 3"  (1.6 m)   Body mass index is 30.11 kg/m.     10/01/2023    9:24 AM 09/26/2022     2:00 PM 09/28/2021    8:00 PM 08/30/2021    1:22 PM 02/05/2020    1:18 PM 09/10/2018    8:30 AM 09/02/2018    8:21 AM  Advanced Directives  Does Patient Have a Medical Advance Directive? Yes Yes Yes Yes Yes Yes Yes  Type of Estate agent of [place];Living will Healthcare Power of [place];Living will Living will;Healthcare Power of Attorney Living will Healthcare Power of [place];Living will Healthcare Power of [place];Living will Healthcare Power of [place];Living will  Does patient want to make changes to medical advance directive? No - Patient declined  No - Patient declined   No - Patient declined No - Patient declined  Copy of Healthcare Power of Attorney in Chart? No - copy requested No - copy requested No - copy requested  No - copy requested No - copy requested     Current Medications (verified) Outpatient Encounter Medications as of 10/01/2023  Medication Sig   Accu-Chek Softclix Lancets lancets Use to check blood sugar daily. Dx E11.9   apixaban (ELIQUIS) 5 MG TABS tablet Take 1 tablet (5 mg total) by mouth 2 (two) times daily.   atorvastatin (LIPITOR) 40 MG tablet Take 1 tablet (40 mg total) by mouth daily at 6PM.   Blood Glucose Monitoring Suppl (ACCU-CHEK GUIDE) w/Device KIT Use to check blood sugar daily. Dx E11.9   glucose blood (ACCU-CHEK GUIDE) test strip USE TO CHECK BLOOD SUGAR DAILY   levothyroxine (SYNTHROID) 88 MCG tablet Take 1 tablet (88 mcg total) by mouth daily.   metFORMIN (GLUCOPHAGE) 500 MG tablet Take 1 tablet (500 mg total) by mouth 2 (two)  times daily.   Multiple Vitamins-Minerals (PRESERVISION AREDS 2 PO) Take 1 tablet by mouth 2 (two) times daily.    ramipril (ALTACE) 10 MG capsule Take 1 capsule (10 mg total) by mouth daily.   valACYclovir (VALTREX) 500 MG tablet Take 500 mg by mouth daily as needed (fever blister).   vitamin B-12 (CYANOCOBALAMIN) 1000 MCG tablet Take 1 tablet (1,000 mcg total) by mouth daily.   vitamin C (ASCORBIC  ACID) 250 MG tablet Take 500 mg by mouth daily.   VITAMIN D PO Take 1,000 Units by mouth daily.   No facility-administered encounter medications on file as of 10/01/2023.    Allergies (verified) Codeine and Dilaudid [hydromorphone hcl]   History: Past Medical History:  Diagnosis Date   Aortic stenosis    Arthritis    B12 deficiency    Diabetes mellitus    type 2   Heart murmur    aortic stenosis   Hyperlipidemia    Hypertension    Hypothyroidism    IBS (irritable bowel syndrome)    Macular degeneration    Macular degeneration of right eye    NSVD (normal spontaneous vaginal delivery)    X 4   Pain    INTRASCAPULAR PAIN   SAB (spontaneous abortion)    X 2   Thyroid disease    HYPOTHYROIDISM   Past Surgical History:  Procedure Laterality Date   ABDOMINAL HYSTERECTOMY  1977   TAH  (FIBROIDS)   CATARACT EXTRACTION Bilateral    DILATION AND CURETTAGE OF UTERUS     DOPPLER ECHOCARDIOGRAPHY  2009   HAD AN ECHOCARDIOGRAM THAT SHOWED MODERATE MITRAL  ANNULAR CALCIFICATION AND A NORMAL EJECTION FRACTION OF 55%   SHOULDER SURGERY     RIGHT SHOULDER SURGERY   THYROIDECTOMY     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   RIGHT   TOTAL KNEE ARTHROPLASTY Left 09/10/2018   Procedure: TOTAL KNEE ARTHROPLASTY;  Surgeon: Marcene Corning, MD;  Location: [facility];  Service: Orthopedics;  Laterality: Left;   Family History  Problem Relation Age of Onset   Osteoporosis Mother    Heart disease Mother    Macular degeneration Mother    Heart disease Father    Cancer Father        BRAIN TUMOR   Cancer Brother        BRAIN TUMOR   Diabetes Maternal Aunt    Diabetes Maternal Uncle    Stroke Paternal Grandfather    Colon cancer Neg Hx    Breast cancer Neg Hx    Migraines Neg Hx    Social History   Socioeconomic History   Marital status: Married    Spouse name: Not on file   Number of children: 4   Years of education: Not on file   Highest education level: Not on file  Occupational  History   Not on file  Tobacco Use   Smoking status: Never   Smokeless tobacco: Never  Vaping Use   Vaping status: Never Used  Substance and Sexual Activity   Alcohol use: No    Alcohol/week: 0.0 standard drinks of alcohol   Drug use: No   Sexual activity: Yes    Partners: Male    Birth control/protection: Surgical    Comment: 1st intercourse- 17, partners- 1, married- 57 yrs   Other Topics Concern   Not on file  Social History Narrative   Lives with husband, Ludger Nutting- daughter, documents at home   Married 1960   Social  Drivers of Health   Financial Resource Strain: Low Risk  (10/01/2023)   Overall Financial Resource Strain (CARDIA)    Difficulty of Paying Living Expenses: Not hard at all  Food Insecurity: No Food Insecurity (10/01/2023)   Hunger Vital Sign    Worried About Running Out of Food in the Last Year: Never true    Ran Out of Food in the Last Year: Never true  Transportation Needs: No Transportation Needs (10/01/2023)   PRAPARE - Administrator, Civil Service (Medical): No    Lack of Transportation (Non-Medical): No  Physical Activity: Insufficiently Active (10/01/2023)   Exercise Vital Sign    Days of Exercise per Week: 1 day    Minutes of Exercise per Session: 10 min  Stress: No Stress Concern Present (10/01/2023)   [employer] of Occupational Health - Occupational Stress Questionnaire    Feeling of Stress : Not at all  Social Connections: Moderately Integrated (10/01/2023)   Social Connection and Isolation Panel [NHANES]    Frequency of Communication with Friends and Family: More than three times a week    Frequency of Social Gatherings with Friends and Family: More than three times a week    Attends Religious Services: 1 to 4 times per year    Active Member of [employer] or Organizations: No    Attends Engineer, structural: Never    Marital Status: Married    Tobacco Counseling Counseling given: Not Answered    Clinical  Intake:  Pre-visit preparation completed: Yes  Pain : No/denies pain     BMI - recorded: 30.11 Nutritional Status: BMI > 30  Obese Nutritional Risks: None Diabetes: Yes CBG done?: No Did pt. bring in CBG monitor from home?: No  Lab Results  Component Value Date   HGBA1C 6.1 09/22/2022   HGBA1C 6.2 01/11/2022   HGBA1C 6.2 09/08/2021        Interpreter Needed?: No  Information entered by :: Leesa Leifheit,cma   Activities of Daily Living     10/01/2023    9:31 AM  In your present state of health, do you have any difficulty performing the following activities:  Hearing? 0  Vision? 0  Difficulty concentrating or making decisions? 0  Walking or climbing stairs? 0  Dressing or bathing? 0  Doing errands, shopping? 0  Preparing Food and eating ? N  Using the Toilet? N  In the past six months, have you accidently leaked urine? Y  Comment yes sometimes  Do you have problems with loss of bowel control? Y  Comment sometimes  Managing your Medications? N  Managing your Finances? N  Housekeeping or managing your Housekeeping? N    Patient Care Team: Joaquim Nam, MD as PCP - General (Family Medicine)  Indicate any recent Medical Services you may have received from other than [facility] providers in the past year (date may be approximate).     Assessment:   This is a routine wellness examination for Nickole.  Hearing/Vision screen Hearing Screening - Comments:: No hearing issues  Vision Screening - Comments:: Patient has some vision issues    Goals Addressed             This Visit's Progress    Patient Stated   On track    Stay  healthy        Depression Screen     10/01/2023    9:33 AM 10/10/2022   11:06 AM 09/26/2022    2:00 PM 08/30/2021  1:21 PM 07/20/2020    8:25 AM 02/05/2020    1:19 PM 01/29/2019    2:37 PM  PHQ 2/9 Scores  PHQ - 2 Score 0 0 0 0 0 0 0  PHQ- 9 Score 0 0    0     Fall Risk     10/01/2023    9:30 AM 10/10/2022   11:06 AM  09/26/2022    2:01 PM 08/30/2021    1:23 PM 07/20/2020    8:25 AM  Fall Risk   Falls in the past year? 0 0 0 0 0  Number falls in past yr: 0 0 0 0 0  Injury with Fall? 0 0 0 0 0  Risk for fall due to : No Fall Risks No Fall Risks No Fall Risks Impaired vision   Follow up Falls evaluation completed Falls evaluation completed Falls prevention discussed;Falls evaluation completed Falls prevention discussed Falls evaluation completed    MEDICARE RISK AT HOME:  Medicare Risk at Home Any stairs in or around the home?: Yes If so, are there any without handrails?: Yes Home free of loose throw rugs in walkways, pet beds, electrical cords, etc?: Yes Adequate lighting in your home to reduce risk of falls?: Yes Life alert?: No Use of a cane, walker or w/c?: No Grab bars in the bathroom?: Yes Shower chair or bench in shower?: Yes Elevated toilet seat or a handicapped toilet?: Yes  TIMED UP AND GO:  Was the test performed?  No  Cognitive Function: 6CIT completed    02/05/2020    1:20 PM  MMSE - Mini Mental State Exam  Orientation to time 5  Orientation to Place 5  Registration 3  Attention/ Calculation 5  Recall 3  Language- repeat 1        10/01/2023    9:28 AM 09/26/2022    2:02 PM 08/30/2021    1:25 PM  6CIT Screen  What Year?  0 points 0 points  What month?  0 points 0 points  What time? 0 points 0 points 0 points  Count back from 20 0 points 0 points 0 points  Months in reverse 0 points 0 points 0 points  Repeat phrase 0 points 0 points 0 points  Total Score  0 points 0 points    Immunizations Immunization History  Administered Date(s) Administered   Fluad Quad(high Dose 65+) 05/07/2019, 06/03/2019   Influenza, High Dose Seasonal PF 03/20/2018, 05/02/2023   Influenza-Unspecified 05/10/2021   Moderna Sars-Covid-2 Vaccination 10/07/2019, 11/04/2019, 06/30/2020   Pneumococcal Conjugate-13 04/22/2017   Pneumococcal Polysaccharide-23 04/10/2008, 04/22/2018   Tdap  07/09/2012, 05/02/2023    Screening Tests Health Maintenance  Topic Date Due   Zoster Vaccines- Shingrix (1 of 2) Never done   Cervical Cancer Screening (Pap smear)  06/22/2018   FOOT EXAM  09/16/2022   COVID-19 Vaccine (4 - 2024-25 season) 03/11/2023   HEMOGLOBIN A1C  03/25/2023   OPHTHALMOLOGY EXAM  07/14/2023   Diabetic kidney evaluation - eGFR measurement  09/22/2023   Diabetic kidney evaluation - Urine ACR  09/22/2023   MAMMOGRAM  06/20/2024   Medicare Annual Wellness (AWV)  09/30/2024   DTaP/Tdap/Td (3 - Td or Tdap) 05/01/2033   Pneumonia Vaccine 82+ Years old  Completed   INFLUENZA VACCINE  Completed   DEXA SCAN  Completed   HPV VACCINES  Aged Out    Health Maintenance  Health Maintenance Due  Topic Date Due   Zoster Vaccines- Shingrix (1 of 2) Never done  Cervical Cancer Screening (Pap smear)  06/22/2018   FOOT EXAM  09/16/2022   COVID-19 Vaccine (4 - 2024-25 season) 03/11/2023   HEMOGLOBIN A1C  03/25/2023   OPHTHALMOLOGY EXAM  07/14/2023   Diabetic kidney evaluation - eGFR measurement  09/22/2023   Diabetic kidney evaluation - Urine ACR  09/22/2023   Health Maintenance Items Addressed:Health Maintenance Due items were discussed. Patient declined vaccines, foot exam and A1c. Patient states she had a eye exam last week and records were requested.  Additional Screening:  Vision Screening: Recommended annual ophthalmology exams for early detection of glaucoma and other disorders of the eye.  Dental Screening: Recommended annual dental exams for proper oral hygiene  Community Resource Referral / Chronic Care Management: CRR required this visit?  No   CCM required this visit?  No     Plan:     I have personally reviewed and noted the following in the patient's chart:   Medical and social history Use of alcohol, tobacco or illicit drugs  Current medications and supplements including opioid prescriptions. Patient is not currently taking opioid  prescriptions. Functional ability and status Nutritional status Physical activity Advanced directives List of other physicians Hospitalizations, surgeries, and ER visits in previous 12 months Vitals Screenings to include cognitive, depression, and falls Referrals and appointments  In addition, I have reviewed and discussed with patient certain preventive protocols, quality metrics, and best practice recommendations. A written personalized care plan for preventive services as well as general preventive health recommendations were provided to patient.     Rudi Heap, [place]   10/01/2023   After Visit Summary: (MyChart) Due to this being a telephonic visit, the after visit summary with patients personalized plan was offered to patient via MyChart   Notes: Nothing significant to report at this time.

## 2023-10-02 ENCOUNTER — Other Ambulatory Visit: Payer: Self-pay | Admitting: Family Medicine

## 2023-10-04 ENCOUNTER — Other Ambulatory Visit (HOSPITAL_COMMUNITY): Payer: Self-pay

## 2023-10-04 ENCOUNTER — Other Ambulatory Visit: Payer: Self-pay

## 2023-10-04 MED ORDER — LEVOTHYROXINE SODIUM 88 MCG PO TABS
88.0000 ug | ORAL_TABLET | Freq: Every day | ORAL | 1 refills | Status: DC
  Filled 2023-10-04 – 2023-10-05 (×2): qty 90, 90d supply, fill #0

## 2023-10-05 ENCOUNTER — Other Ambulatory Visit: Payer: Self-pay

## 2023-10-07 ENCOUNTER — Other Ambulatory Visit: Payer: Self-pay | Admitting: Family Medicine

## 2023-10-07 DIAGNOSIS — M8580 Other specified disorders of bone density and structure, unspecified site: Secondary | ICD-10-CM

## 2023-10-07 DIAGNOSIS — E785 Hyperlipidemia, unspecified: Secondary | ICD-10-CM

## 2023-10-07 DIAGNOSIS — E119 Type 2 diabetes mellitus without complications: Secondary | ICD-10-CM

## 2023-10-09 ENCOUNTER — Other Ambulatory Visit (INDEPENDENT_AMBULATORY_CARE_PROVIDER_SITE_OTHER): Payer: HMO

## 2023-10-09 DIAGNOSIS — M8580 Other specified disorders of bone density and structure, unspecified site: Secondary | ICD-10-CM

## 2023-10-09 DIAGNOSIS — E785 Hyperlipidemia, unspecified: Secondary | ICD-10-CM

## 2023-10-09 DIAGNOSIS — D649 Anemia, unspecified: Secondary | ICD-10-CM

## 2023-10-09 DIAGNOSIS — E119 Type 2 diabetes mellitus without complications: Secondary | ICD-10-CM | POA: Diagnosis not present

## 2023-10-09 LAB — CBC WITH DIFFERENTIAL/PLATELET
Basophils Absolute: 0.1 10*3/uL (ref 0.0–0.1)
Basophils Relative: 1.3 % (ref 0.0–3.0)
Eosinophils Absolute: 0.3 10*3/uL (ref 0.0–0.7)
Eosinophils Relative: 4.1 % (ref 0.0–5.0)
HCT: 34.9 % — ABNORMAL LOW (ref 36.0–46.0)
Hemoglobin: 11.5 g/dL — ABNORMAL LOW (ref 12.0–15.0)
Lymphocytes Relative: 12.4 % (ref 12.0–46.0)
Lymphs Abs: 1 10*3/uL (ref 0.7–4.0)
MCHC: 33.1 g/dL (ref 30.0–36.0)
MCV: 85.3 fl (ref 78.0–100.0)
Monocytes Absolute: 0.9 10*3/uL (ref 0.1–1.0)
Monocytes Relative: 10.5 % (ref 3.0–12.0)
Neutro Abs: 5.9 10*3/uL (ref 1.4–7.7)
Neutrophils Relative %: 71.7 % (ref 43.0–77.0)
Platelets: 539 10*3/uL — ABNORMAL HIGH (ref 150.0–400.0)
RBC: 4.09 Mil/uL (ref 3.87–5.11)
RDW: 14.1 % (ref 11.5–15.5)
WBC: 8.2 10*3/uL (ref 4.0–10.5)

## 2023-10-09 LAB — LIPID PANEL
Cholesterol: 120 mg/dL (ref 0–200)
HDL: 33.7 mg/dL — ABNORMAL LOW (ref >39.00)
LDL Cholesterol: 67 mg/dL (ref 0–99)
NonHDL: 86.2
Total CHOL/HDL Ratio: 4
Triglycerides: 94 mg/dL (ref 0.0–149.0)
VLDL: 18.8 mg/dL (ref 0.0–40.0)

## 2023-10-09 LAB — IRON: Iron: 64 ug/dL (ref 42–145)

## 2023-10-09 LAB — COMPREHENSIVE METABOLIC PANEL WITH GFR
ALT: 13 U/L (ref 0–35)
AST: 16 U/L (ref 0–37)
Albumin: 3.9 g/dL (ref 3.5–5.2)
Alkaline Phosphatase: 73 U/L (ref 39–117)
BUN: 22 mg/dL (ref 6–23)
CO2: 28 meq/L (ref 19–32)
Calcium: 9 mg/dL (ref 8.4–10.5)
Chloride: 105 meq/L (ref 96–112)
Creatinine, Ser: 0.82 mg/dL (ref 0.40–1.20)
GFR: 65.68 mL/min (ref >60.00)
Glucose, Bld: 127 mg/dL — ABNORMAL HIGH (ref 70–99)
Potassium: 4.3 meq/L (ref 3.5–5.1)
Sodium: 142 meq/L (ref 135–145)
Total Bilirubin: 0.5 mg/dL (ref 0.2–1.2)
Total Protein: 6.1 g/dL (ref 6.0–8.3)

## 2023-10-09 LAB — MICROALBUMIN / CREATININE URINE RATIO
Creatinine,U: 150.9 mg/dL
Microalb Creat Ratio: 21 mg/g (ref 0.0–30.0)
Microalb, Ur: 3.2 mg/dL — ABNORMAL HIGH (ref 0.0–1.9)

## 2023-10-09 LAB — FERRITIN: Ferritin: 27.8 ng/mL (ref 10.0–291.0)

## 2023-10-09 LAB — VITAMIN B12: Vitamin B-12: 1098 pg/mL — ABNORMAL HIGH (ref 211–911)

## 2023-10-09 LAB — HEMOGLOBIN A1C: Hgb A1c MFr Bld: 6.4 % (ref 4.6–6.5)

## 2023-10-09 LAB — TSH: TSH: 1.15 u[IU]/mL (ref 0.35–5.50)

## 2023-10-09 LAB — VITAMIN D 25 HYDROXY (VIT D DEFICIENCY, FRACTURES): VITD: 44.58 ng/mL (ref 30.00–100.00)

## 2023-10-11 ENCOUNTER — Other Ambulatory Visit: Payer: HMO

## 2023-10-11 ENCOUNTER — Ambulatory Visit (HOSPITAL_COMMUNITY): Payer: HMO | Attending: Cardiology

## 2023-10-11 ENCOUNTER — Encounter: Payer: Self-pay | Admitting: Cardiology

## 2023-10-11 DIAGNOSIS — I080 Rheumatic disorders of both mitral and aortic valves: Secondary | ICD-10-CM | POA: Insufficient documentation

## 2023-10-11 LAB — ECHOCARDIOGRAM COMPLETE
AR max vel: 0.4 cm2
AV Area VTI: 0.41 cm2
AV Area mean vel: 0.38 cm2
AV Mean grad: 48 mmHg
AV Peak grad: 77.8 mmHg
Ao pk vel: 4.41 m/s
Area-P 1/2: 5.46 cm2
MV M vel: 4.96 m/s
MV Peak grad: 98.4 mmHg
MV VTI: 1.21 cm2
Radius: 0.6 cm
S' Lateral: 3.1 cm

## 2023-10-11 NOTE — Progress Notes (Signed)
 Severe aortic valve stenosis, aortic stenosis has progressed from previous.  She will probably need TAVR, she has an appointment coming up soon and I will discuss this further.

## 2023-10-16 ENCOUNTER — Encounter: Payer: Self-pay | Admitting: Family Medicine

## 2023-10-16 ENCOUNTER — Other Ambulatory Visit (HOSPITAL_COMMUNITY): Payer: Self-pay

## 2023-10-16 ENCOUNTER — Ambulatory Visit (INDEPENDENT_AMBULATORY_CARE_PROVIDER_SITE_OTHER): Payer: HMO | Admitting: Family Medicine

## 2023-10-16 ENCOUNTER — Other Ambulatory Visit: Payer: Self-pay

## 2023-10-16 VITALS — BP 134/68 | HR 78 | Temp 97.9°F | Ht 61.6 in | Wt 166.0 lb

## 2023-10-16 DIAGNOSIS — I4891 Unspecified atrial fibrillation: Secondary | ICD-10-CM

## 2023-10-16 DIAGNOSIS — I350 Nonrheumatic aortic (valve) stenosis: Secondary | ICD-10-CM

## 2023-10-16 DIAGNOSIS — R7989 Other specified abnormal findings of blood chemistry: Secondary | ICD-10-CM | POA: Diagnosis not present

## 2023-10-16 DIAGNOSIS — E039 Hypothyroidism, unspecified: Secondary | ICD-10-CM

## 2023-10-16 DIAGNOSIS — E785 Hyperlipidemia, unspecified: Secondary | ICD-10-CM

## 2023-10-16 DIAGNOSIS — I10 Essential (primary) hypertension: Secondary | ICD-10-CM | POA: Diagnosis not present

## 2023-10-16 DIAGNOSIS — E119 Type 2 diabetes mellitus without complications: Secondary | ICD-10-CM | POA: Diagnosis not present

## 2023-10-16 DIAGNOSIS — Z0000 Encounter for general adult medical examination without abnormal findings: Secondary | ICD-10-CM

## 2023-10-16 DIAGNOSIS — Z7189 Other specified counseling: Secondary | ICD-10-CM

## 2023-10-16 MED ORDER — RAMIPRIL 10 MG PO CAPS
10.0000 mg | ORAL_CAPSULE | Freq: Every day | ORAL | 3 refills | Status: DC
  Filled 2023-10-16: qty 90, 90d supply, fill #0

## 2023-10-16 MED ORDER — LEVOTHYROXINE SODIUM 88 MCG PO TABS
88.0000 ug | ORAL_TABLET | Freq: Every day | ORAL | 3 refills | Status: AC
  Filled 2023-10-16 – 2024-01-08 (×2): qty 90, 90d supply, fill #0
  Filled 2024-04-07: qty 90, 90d supply, fill #1
  Filled 2024-07-06: qty 90, 90d supply, fill #2

## 2023-10-16 MED ORDER — VALACYCLOVIR HCL 500 MG PO TABS
500.0000 mg | ORAL_TABLET | Freq: Every day | ORAL | 2 refills | Status: AC | PRN
  Filled 2023-10-16: qty 30, 30d supply, fill #0

## 2023-10-16 MED ORDER — ATORVASTATIN CALCIUM 40 MG PO TABS
40.0000 mg | ORAL_TABLET | Freq: Every day | ORAL | 3 refills | Status: AC
  Filled 2023-10-16: qty 90, 90d supply, fill #0
  Filled 2024-01-12: qty 90, 90d supply, fill #1
  Filled 2024-04-16: qty 90, 90d supply, fill #2
  Filled 2024-07-06: qty 90, 90d supply, fill #3

## 2023-10-16 NOTE — Patient Instructions (Addendum)
 Stop the metformin and let me know about your sugar and GI symptoms in about 2 weeks.   Take care.  Glad to see you. It would be reasonable to recheck your labs in about 4 months at an office visit.  You don't need to fast.

## 2023-10-16 NOTE — Progress Notes (Unsigned)
 H/o AF.  Anticoagulated with eliquis. No bleeding. Mild stable anemia.  Diabetes:  Using medications without difficulties: some diarrhea, taking metformin BID.   Hypoglycemic episodes: no Hyperglycemic episodes: no Feet problems: no Blood Sugars averaging: usually ~110s-130s eye exam within last year: yes Labs d/w pt.   Prev MALB d/w pt.    D/w pt about recheck CBC with PLT elevation.   Hypertension:    Using medication without problems or lightheadedness: yes Chest pain with exertion:no Edema:no Short of breath: only if sig exertion, this is baseline.    She is going to see cardiology soon re: aortic valve.    Elevated Cholesterol: Using medications without problems:yes Muscle aches: no Diet compliance: d/w pt.   Exercise: d/w pt.    Hypothyroidism. No ADE on med.  TSH wnl, labs d/w pt.  No neck mass. No dysphagia.   Husband designated if patient were incapacitated.   Vaccine d/w pt.  Shingles d/w pt.   RSV d/w pt.   Colon and pap not due.   DXA 2024 mammogram 2024.   PMH and SH reviewed.   Vital signs, Meds and allergies reviewed.  ROS: Per HPI unless specifically indicated in ROS section   GEN: nad, alert and oriented HEENT: ncat NECK: supple w/o LA CV: rrr.  Murmur noted.  PULM: ctab, no inc wob ABD: soft, +bs EXT: no edema SKIN: no acute rash  Diabetic foot exam: Normal inspection No skin breakdown No calluses  Normal DP pulses Normal sensation to light tough and monofilament Nails normal

## 2023-10-17 ENCOUNTER — Ambulatory Visit: Payer: HMO | Admitting: Cardiology

## 2023-10-17 DIAGNOSIS — R7989 Other specified abnormal findings of blood chemistry: Secondary | ICD-10-CM | POA: Insufficient documentation

## 2023-10-17 NOTE — Assessment & Plan Note (Signed)
 Would continue Eliquis.  Discussed.

## 2023-10-17 NOTE — Assessment & Plan Note (Signed)
 Husband designated if patient were incapacitated.   Vaccine d/w pt.  Shingles d/w pt.   RSV d/w pt.   Colon and pap not due.   DXA 2024 mammogram 2024.

## 2023-10-17 NOTE — Assessment & Plan Note (Signed)
Continue levothyroxine as is. 

## 2023-10-17 NOTE — Assessment & Plan Note (Signed)
Continue ramipril.  Labs discussed with patient. 

## 2023-10-17 NOTE — Assessment & Plan Note (Signed)
 Labs d/w pt.   Prev MALB d/w pt.   Reasonable to stop the metformin and let me know about sugar and GI symptoms in about 2 weeks.   It would be reasonable to recheck labs in about 4 months at an office visit.

## 2023-10-17 NOTE — Assessment & Plan Note (Signed)
Continue atorvastatin.  Labs discussed with patient.

## 2023-10-17 NOTE — Assessment & Plan Note (Signed)
 She is going to follow-up with cardiology and I greatly appreciate the help of all involved.

## 2023-10-17 NOTE — Assessment & Plan Note (Signed)
Husband designated if patient were incapacitated. 

## 2023-10-17 NOTE — Assessment & Plan Note (Signed)
 Discussed options. It would be reasonable to recheck labs in about 4 months at an office visit.  Ordered CBC with path review.

## 2023-10-18 ENCOUNTER — Encounter: Payer: Self-pay | Admitting: Cardiology

## 2023-10-18 ENCOUNTER — Other Ambulatory Visit: Payer: Self-pay

## 2023-10-18 ENCOUNTER — Ambulatory Visit: Payer: HMO | Attending: Cardiology | Admitting: Cardiology

## 2023-10-18 ENCOUNTER — Other Ambulatory Visit (HOSPITAL_COMMUNITY): Payer: Self-pay

## 2023-10-18 VITALS — BP 114/68 | HR 108 | Resp 16 | Ht 61.0 in | Wt 166.0 lb

## 2023-10-18 DIAGNOSIS — I350 Nonrheumatic aortic (valve) stenosis: Secondary | ICD-10-CM

## 2023-10-18 DIAGNOSIS — I080 Rheumatic disorders of both mitral and aortic valves: Secondary | ICD-10-CM

## 2023-10-18 DIAGNOSIS — I10 Essential (primary) hypertension: Secondary | ICD-10-CM | POA: Diagnosis not present

## 2023-10-18 DIAGNOSIS — G459 Transient cerebral ischemic attack, unspecified: Secondary | ICD-10-CM

## 2023-10-18 DIAGNOSIS — I480 Paroxysmal atrial fibrillation: Secondary | ICD-10-CM | POA: Diagnosis not present

## 2023-10-18 MED ORDER — METOPROLOL SUCCINATE ER 25 MG PO TB24
25.0000 mg | ORAL_TABLET | Freq: Every day | ORAL | 2 refills | Status: DC
Start: 2023-10-18 — End: 2023-11-22
  Filled 2023-10-18: qty 30, 30d supply, fill #0
  Filled 2023-11-12: qty 30, 30d supply, fill #1

## 2023-10-18 MED ORDER — AMIODARONE HCL 200 MG PO TABS
200.0000 mg | ORAL_TABLET | Freq: Two times a day (BID) | ORAL | 2 refills | Status: DC
Start: 2023-10-18 — End: 2023-11-13
  Filled 2023-10-18: qty 60, 30d supply, fill #0

## 2023-10-18 MED ORDER — APIXABAN 5 MG PO TABS
5.0000 mg | ORAL_TABLET | Freq: Two times a day (BID) | ORAL | 1 refills | Status: DC
  Filled 2023-10-18: qty 180, 90d supply, fill #0

## 2023-10-18 NOTE — Progress Notes (Signed)
 Cardiology Office Note:  .   Date:  10/18/2023  ID:  JOHNIE STADEL, DOB 1940/11/19, MRN [id number] PCP: Joaquim Nam, MD  [facility] Providers Cardiologist:  None   History of Present Illness: .   Nery Kalisz Priest is a 83 y.o. Caucasian female with history of hyperlipidemia, controlled diabetes, HTN, hypothyroidism, and mild AI and moderate aortic stenosis,  mild to moderate mitral regurgitation, abdominal atherosclerosis and abdominal ectasia, TIA with visual disturbances in Mar 2023 with AF noted on Zio and presently on anticoagulation.  She underwent repeat echocardiogram on 10/11/2023 and presents for follow-up.  She remains asymptomatic.  Accompanied by 2 daughters.  Discussed the use of AI scribe software for clinical note transcription with the patient, who gave verbal consent to proceed.  Labs   Lab Results  Component Value Date   CHOL 120 10/09/2023   HDL 33.70 (L) 10/09/2023   LDLCALC 67 10/09/2023   TRIG 94.0 10/09/2023   CHOLHDL 4 10/09/2023   Lab Results  Component Value Date   NA 142 10/09/2023   K 4.3 10/09/2023   CO2 28 10/09/2023   GLUCOSE 127 (H) 10/09/2023   BUN 22 10/09/2023   CREATININE 0.82 10/09/2023   CALCIUM 9.0 10/09/2023   GFR 65.68 10/09/2023   GFRNONAA >60 09/28/2021      Latest Ref Rng & Units 10/09/2023    7:58 AM 09/22/2022    8:13 AM 09/28/2021   12:17 PM  BMP  Glucose 70 - 99 mg/dL 846  962  952   BUN 6 - 23 mg/dL 22  21  16    Creatinine 0.40 - 1.20 mg/dL 8.41  3.24  4.01   Sodium 135 - 145 mEq/L 142  141  142   Potassium 3.5 - 5.1 mEq/L 4.3  4.6  4.0   Chloride 96 - 112 mEq/L 105  106  103   CO2 19 - 32 mEq/L 28  27    Calcium 8.4 - 10.5 mg/dL 9.0  8.7        Latest Ref Rng & Units 10/09/2023    7:58 AM 09/22/2022    8:13 AM 09/28/2021   12:17 PM  CBC  WBC 4.0 - 10.5 K/uL 8.2  6.8    Hemoglobin 12.0 - 15.0 g/dL 02.7  25.3  66.4   Hematocrit 36.0 - 46.0 % 34.9  33.1  33.0   Platelets 150.0 - 400.0 K/uL 539.0  431.0      Lab Results  Component Value Date   HGBA1C 6.4 10/09/2023    Lab Results  Component Value Date   TSH 1.15 10/09/2023    Review of Systems  Cardiovascular:  Negative for chest pain, dyspnea on exertion, leg swelling, near-syncope, palpitations and paroxysmal nocturnal dyspnea.   Physical Exam:   VS:  There were no vitals taken for this visit.   Wt Readings from Last 3 Encounters:  10/16/23 166 lb (75.3 kg)  10/01/23 170 lb (77.1 kg)  10/11/22 166 lb (75.3 kg)    Physical Exam Neck:     Vascular: Carotid bruit (bilateral AS conducted sound) present. No JVD.  Cardiovascular:     Rate and Rhythm: Tachycardia present. Rhythm irregular.     Pulses: Normal pulses and intact distal pulses.     Heart sounds: S1 normal. Murmur heard.     Harsh mid to late systolic murmur is present with a grade of 3/6 at the upper right sternal border radiating to the neck.  No gallop.  Pulmonary:     Effort: Pulmonary effort is normal.     Breath sounds: Normal breath sounds.  Abdominal:     General: Bowel sounds are normal.     Palpations: Abdomen is soft.  Musculoskeletal:     Right lower leg: No edema.     Left lower leg: No edema.  Skin:    Capillary Refill: Capillary refill takes less than 2 seconds.    Studies Reviewed: .    Carotid artery duplex  04/03/19  No hemodynamically significant arterial disease in the internal carotid artery bilaterally.  Antegrade right vertebral artery flow. Antegrade left vertebral artery flow.  ECHOCARDIOGRAM COMPLETE 10/11/2023  1. Left ventricular ejection fraction, by estimation, is 60 to 65%. The left ventricle has normal function. The left ventricle has no regional wall motion abnormalities. There is mild left ventricular hypertrophy of the basal-septal segment. Left ventricular diastolic parameters are indeterminate. Elevated left ventricular end-diastolic pressure. The average left ventricular global longitudinal strain is -16.0 %. The global  longitudinal strain is abnormal. 2. Right ventricular systolic function is normal. The right ventricular size is normal. There is moderately elevated pulmonary artery systolic pressure. The estimated right ventricular systolic pressure is 51.4 mmHg. 3. The mitral valve is degenerative. Mild to moderate mitral valve regurgitation. Mild mitral stenosis. The mean mitral valve gradient is 4.5 mmHg. Severe mitral annular calcification. 4. The aortic valve is calcified. There is severe calcifcation of the aortic valve. There is severe thickening of the aortic valve. Aortic valve regurgitation is trivial. Severe aortic valve stenosis. Aortic valve area, by VTI measures 0.41 cm. Aortic valve mean gradient measures 48.0 mmHg. Aortic valve Vmax measures 4.41 m/s. 5. The inferior vena cava is normal in size with greater than 50% respiratory variability, suggesting right atrial pressure of 3 mmHg. 6. Compared to study dated 09/29/21, the mean AVG has increased from 25 to , VMax has increased from 3.42 to 4.79m/s and DVI has decreased from 0.3 to 0.18.  EKG:         Medications and allergies    Allergies  Allergen Reactions   Codeine Itching    After 3 days   Dilaudid [Hydromorphone Hcl] Itching    After 3 days     Current Outpatient Medications:    Accu-Chek Softclix Lancets lancets, Use to check blood sugar daily. Dx E11.9, Disp: 100 each, Rfl: 12   apixaban (ELIQUIS) 5 MG TABS tablet, Take 1 tablet (5 mg total) by mouth 2 (two) times daily., Disp: 180 tablet, Rfl: 1   atorvastatin (LIPITOR) 40 MG tablet, Take 1 tablet (40 mg total) by mouth daily at 6PM., Disp: 90 tablet, Rfl: 3   Blood Glucose Monitoring Suppl (ACCU-CHEK GUIDE) w/Device KIT, Use to check blood sugar daily. Dx E11.9, Disp: 1 kit, Rfl: 0   glucose blood (ACCU-CHEK GUIDE) test strip, USE TO CHECK BLOOD SUGAR DAILY, Disp: 100 strip, Rfl: 12   levothyroxine (SYNTHROID) 88 MCG tablet, Take 1 tablet (88 mcg total) by mouth daily.,  Disp: 90 tablet, Rfl: 3   Multiple Vitamins-Minerals (PRESERVISION AREDS 2 PO), Take 1 tablet by mouth 2 (two) times daily. , Disp: , Rfl:    ramipril (ALTACE) 10 MG capsule, Take 1 capsule (10 mg total) by mouth daily., Disp: 90 capsule, Rfl: 3   valACYclovir (VALTREX) 500 MG tablet, Take 1 tablet (500 mg total) by mouth daily as needed (fever blister)., Disp: 30 tablet, Rfl: 2   vitamin B-12 (CYANOCOBALAMIN) 1000 MCG tablet, Take  1 tablet (1,000 mcg total) by mouth daily., Disp: , Rfl:    vitamin C (ASCORBIC ACID) 250 MG tablet, Take 500 mg by mouth daily., Disp: , Rfl:    VITAMIN D PO, Take 1,000 Units by mouth daily., Disp: , Rfl:    No orders of the defined types were placed in this encounter.    There are no discontinued medications.   ASSESSMENT AND PLAN: .      ICD-10-CM   1. Severe aortic stenosis  I35.0     2. Mitral and aortic valve disease  I08.0     3. Paroxysmal atrial fibrillation (HCC)  I48.0     4. Essential hypertension  I10      1. Severe aortic stenosis Patient has severe aortic stenosis, gradients have significantly increased.  Although asymptomatic, given the severity of AS, right and left heart catheterization to evaluate further.  Schedule for cardiac catheterization, and possible angioplasty. We discussed regarding risks, benefits, alternatives to this including stress testing, CTA and continued medical therapy. Patient wants to proceed. Understands <1-2% risk of death, stroke, MI, urgent CABG, bleeding, infection, renal failure but not limited to these.   2. Mitral and aortic valve disease Mitral regurgitation is only mild at most mild to moderate, she does not need any intervention for the same.  3. Paroxysmal atrial fibrillation (HCC) She is in atrial fibrillation with a rapid ventricular response, heart rate at 125 beats per minute. Discontinue ramipril as her blood pressure is very soft and start metoprolol succinate 25 mg once daily to control heart  rate. Initiate amiodarone 200 mg twice daily for 10 days, then reduce to 200 mg once daily to maintain sinus rhythm post-TAVR without significantly affecting blood pressure. Continue Eliquis for anticoagulation.  4. Essential hypertension Blood pressure is soft and well-controlled on Altace 10 mg daily, which has been discontinued today.  Will continue to monitor this closely.  Office visit in 3 months with me however after cardiac catheterization I will make arrangements either to be seen by CT surgery or by TAVR team.   Yates Decamp, MD, [facility] 10/18/2023, 9:40 AM [facility] [address] [place], [place] [address] Phone: [phone]. Fax:  [phone]

## 2023-10-18 NOTE — H&P (View-Only) (Signed)
 Cardiology Office Note:  .   Date:  10/18/2023  ID:  Dana Sandoval, DOB 1940/11/19, MRN [id number] PCP: Joaquim Nam, MD  [facility] Providers Cardiologist:  None   History of Present Illness: .   Dana Sandoval is a 83 y.o. Caucasian female with history of hyperlipidemia, controlled diabetes, HTN, hypothyroidism, and mild AI and moderate aortic stenosis,  mild to moderate mitral regurgitation, abdominal atherosclerosis and abdominal ectasia, TIA with visual disturbances in Mar 2023 with AF noted on Zio and presently on anticoagulation.  She underwent repeat echocardiogram on 10/11/2023 and presents for follow-up.  She remains asymptomatic.  Accompanied by 2 daughters.  Discussed the use of AI scribe software for clinical note transcription with the patient, who gave verbal consent to proceed.  Labs   Lab Results  Component Value Date   CHOL 120 10/09/2023   HDL 33.70 (L) 10/09/2023   LDLCALC 67 10/09/2023   TRIG 94.0 10/09/2023   CHOLHDL 4 10/09/2023   Lab Results  Component Value Date   NA 142 10/09/2023   K 4.3 10/09/2023   CO2 28 10/09/2023   GLUCOSE 127 (H) 10/09/2023   BUN 22 10/09/2023   CREATININE 0.82 10/09/2023   CALCIUM 9.0 10/09/2023   GFR 65.68 10/09/2023   GFRNONAA >60 09/28/2021      Latest Ref Rng & Units 10/09/2023    7:58 AM 09/22/2022    8:13 AM 09/28/2021   12:17 PM  BMP  Glucose 70 - 99 mg/dL 846  962  952   BUN 6 - 23 mg/dL 22  21  16    Creatinine 0.40 - 1.20 mg/dL 8.41  3.24  4.01   Sodium 135 - 145 mEq/L 142  141  142   Potassium 3.5 - 5.1 mEq/L 4.3  4.6  4.0   Chloride 96 - 112 mEq/L 105  106  103   CO2 19 - 32 mEq/L 28  27    Calcium 8.4 - 10.5 mg/dL 9.0  8.7        Latest Ref Rng & Units 10/09/2023    7:58 AM 09/22/2022    8:13 AM 09/28/2021   12:17 PM  CBC  WBC 4.0 - 10.5 K/uL 8.2  6.8    Hemoglobin 12.0 - 15.0 g/dL 02.7  25.3  66.4   Hematocrit 36.0 - 46.0 % 34.9  33.1  33.0   Platelets 150.0 - 400.0 K/uL 539.0  431.0      Lab Results  Component Value Date   HGBA1C 6.4 10/09/2023    Lab Results  Component Value Date   TSH 1.15 10/09/2023    Review of Systems  Cardiovascular:  Negative for chest pain, dyspnea on exertion, leg swelling, near-syncope, palpitations and paroxysmal nocturnal dyspnea.   Physical Exam:   VS:  There were no vitals taken for this visit.   Wt Readings from Last 3 Encounters:  10/16/23 166 lb (75.3 kg)  10/01/23 170 lb (77.1 kg)  10/11/22 166 lb (75.3 kg)    Physical Exam Neck:     Vascular: Carotid bruit (bilateral AS conducted sound) present. No JVD.  Cardiovascular:     Rate and Rhythm: Tachycardia present. Rhythm irregular.     Pulses: Normal pulses and intact distal pulses.     Heart sounds: S1 normal. Murmur heard.     Harsh mid to late systolic murmur is present with a grade of 3/6 at the upper right sternal border radiating to the neck.  No gallop.  Pulmonary:     Effort: Pulmonary effort is normal.     Breath sounds: Normal breath sounds.  Abdominal:     General: Bowel sounds are normal.     Palpations: Abdomen is soft.  Musculoskeletal:     Right lower leg: No edema.     Left lower leg: No edema.  Skin:    Capillary Refill: Capillary refill takes less than 2 seconds.    Studies Reviewed: .    Carotid artery duplex  04/03/19  No hemodynamically significant arterial disease in the internal carotid artery bilaterally.  Antegrade right vertebral artery flow. Antegrade left vertebral artery flow.  ECHOCARDIOGRAM COMPLETE 10/11/2023  1. Left ventricular ejection fraction, by estimation, is 60 to 65%. The left ventricle has normal function. The left ventricle has no regional wall motion abnormalities. There is mild left ventricular hypertrophy of the basal-septal segment. Left ventricular diastolic parameters are indeterminate. Elevated left ventricular end-diastolic pressure. The average left ventricular global longitudinal strain is -16.0 %. The global  longitudinal strain is abnormal. 2. Right ventricular systolic function is normal. The right ventricular size is normal. There is moderately elevated pulmonary artery systolic pressure. The estimated right ventricular systolic pressure is 51.4 mmHg. 3. The mitral valve is degenerative. Mild to moderate mitral valve regurgitation. Mild mitral stenosis. The mean mitral valve gradient is 4.5 mmHg. Severe mitral annular calcification. 4. The aortic valve is calcified. There is severe calcifcation of the aortic valve. There is severe thickening of the aortic valve. Aortic valve regurgitation is trivial. Severe aortic valve stenosis. Aortic valve area, by VTI measures 0.41 cm. Aortic valve mean gradient measures 48.0 mmHg. Aortic valve Vmax measures 4.41 m/s. 5. The inferior vena cava is normal in size with greater than 50% respiratory variability, suggesting right atrial pressure of 3 mmHg. 6. Compared to study dated 09/29/21, the mean AVG has increased from 25 to , VMax has increased from 3.42 to 4.79m/s and DVI has decreased from 0.3 to 0.18.  EKG:         Medications and allergies    Allergies  Allergen Reactions   Codeine Itching    After 3 days   Dilaudid [Hydromorphone Hcl] Itching    After 3 days     Current Outpatient Medications:    Accu-Chek Softclix Lancets lancets, Use to check blood sugar daily. Dx E11.9, Disp: 100 each, Rfl: 12   apixaban (ELIQUIS) 5 MG TABS tablet, Take 1 tablet (5 mg total) by mouth 2 (two) times daily., Disp: 180 tablet, Rfl: 1   atorvastatin (LIPITOR) 40 MG tablet, Take 1 tablet (40 mg total) by mouth daily at 6PM., Disp: 90 tablet, Rfl: 3   Blood Glucose Monitoring Suppl (ACCU-CHEK GUIDE) w/Device KIT, Use to check blood sugar daily. Dx E11.9, Disp: 1 kit, Rfl: 0   glucose blood (ACCU-CHEK GUIDE) test strip, USE TO CHECK BLOOD SUGAR DAILY, Disp: 100 strip, Rfl: 12   levothyroxine (SYNTHROID) 88 MCG tablet, Take 1 tablet (88 mcg total) by mouth daily.,  Disp: 90 tablet, Rfl: 3   Multiple Vitamins-Minerals (PRESERVISION AREDS 2 PO), Take 1 tablet by mouth 2 (two) times daily. , Disp: , Rfl:    ramipril (ALTACE) 10 MG capsule, Take 1 capsule (10 mg total) by mouth daily., Disp: 90 capsule, Rfl: 3   valACYclovir (VALTREX) 500 MG tablet, Take 1 tablet (500 mg total) by mouth daily as needed (fever blister)., Disp: 30 tablet, Rfl: 2   vitamin B-12 (CYANOCOBALAMIN) 1000 MCG tablet, Take  1 tablet (1,000 mcg total) by mouth daily., Disp: , Rfl:    vitamin C (ASCORBIC ACID) 250 MG tablet, Take 500 mg by mouth daily., Disp: , Rfl:    VITAMIN D PO, Take 1,000 Units by mouth daily., Disp: , Rfl:    No orders of the defined types were placed in this encounter.    There are no discontinued medications.   ASSESSMENT AND PLAN: .      ICD-10-CM   1. Severe aortic stenosis  I35.0     2. Mitral and aortic valve disease  I08.0     3. Paroxysmal atrial fibrillation (HCC)  I48.0     4. Essential hypertension  I10      1. Severe aortic stenosis Patient has severe aortic stenosis, gradients have significantly increased.  Although asymptomatic, given the severity of AS, right and left heart catheterization to evaluate further.  Schedule for cardiac catheterization, and possible angioplasty. We discussed regarding risks, benefits, alternatives to this including stress testing, CTA and continued medical therapy. Patient wants to proceed. Understands <1-2% risk of death, stroke, MI, urgent CABG, bleeding, infection, renal failure but not limited to these.   2. Mitral and aortic valve disease Mitral regurgitation is only mild at most mild to moderate, she does not need any intervention for the same.  3. Paroxysmal atrial fibrillation (HCC) She is in atrial fibrillation with a rapid ventricular response, heart rate at 125 beats per minute. Discontinue ramipril as her blood pressure is very soft and start metoprolol succinate 25 mg once daily to control heart  rate. Initiate amiodarone 200 mg twice daily for 10 days, then reduce to 200 mg once daily to maintain sinus rhythm post-TAVR without significantly affecting blood pressure. Continue Eliquis for anticoagulation.  4. Essential hypertension Blood pressure is soft and well-controlled on Altace 10 mg daily, which has been discontinued today.  Will continue to monitor this closely.  Office visit in 3 months with me however after cardiac catheterization I will make arrangements either to be seen by CT surgery or by TAVR team.   Yates Decamp, MD, [facility] 10/18/2023, 9:40 AM [facility] [address] [place], [place] [address] Phone: [phone]. Fax:  [phone]

## 2023-10-18 NOTE — Patient Instructions (Signed)
 Medication Instructions:  Refilled Eliquis Start Amiondarone 200 mg, take 200 mg twice daily for 10 days, then take 200 mg once daily Start Metoprolol Succinate 25 mg once daily Stop Ramipril *If you need a refill on your cardiac medications before your next appointment, please call your pharmacy*  Lab Work:  If you have labs (blood work) drawn today and your tests are completely normal, you will receive your results only by: MyChart Message (if you have MyChart) OR A paper copy in the mail If you have any lab test that is abnormal or we need to change your treatment, we will call you to review the results.  Testing/Procedures: Left and Right Heart Cath  Follow-Up: At [facility], you and your health needs are our priority.  As part of our continuing mission to provide you with exceptional heart care, our providers are all part of one team.  This team includes your primary Cardiologist (physician) and Advanced Practice Providers or APPs (Physician Assistants and Nurse Practitioners) who all work together to provide you with the care you need, when you need it.  Your next appointment:   3 months  Provider:   Jacinto Halim, MD  We recommend signing up for the patient portal called "MyChart".  Sign up information is provided on this After Visit Summary.  MyChart is used to connect with patients for Virtual Visits (Telemedicine).  Patients are able to view lab/test results, encounter notes, upcoming appointments, etc.  Non-urgent messages can be sent to your provider as well.   To learn more about what you can do with MyChart, go to [email or web address].   Other Instructions       Cardiac/Peripheral Catheterization   You are scheduled for a Cardiac Catheterization on Wednesday, April 16 with Dr.  Jacinto Halim .  1. Please arrive at the [facility] (Main Entrance A) at [facility]: [address] [place], [place] [address] at 6:30 AM (This time is 2 hour(s) before your  procedure to ensure your preparation). Your procedure is scheduled to begin at 8:30 AM.  Free valet parking service is available. You will check in at ADMITTING. The support person will be asked to wait in the waiting room.  It is OK to have someone drop you off and come back when you are ready to be discharged.        Special note: Every effort is made to have your procedure done on time. Please understand that emergencies sometimes delay scheduled procedures.  2. Diet: Do not eat solid foods after midnight.  You may have clear liquids until 5 AM the day of the procedure.  3. Labs: You will not need any additional lab work.  4. Medication instructions in preparation for your procedure:   Contrast Allergy: No  Stop taking Eliquis (Apixiban) on Monday, April 14.  Do not take Diabetes Med Glucophage (Metformin) on the day of the procedure and HOLD 48 HOURS AFTER THE PROCEDURE.  On the morning of your procedure, take Aspirin 81 mg and any morning medicines NOT listed above.  You may use sips of water.  5. Plan to go home the same day, you will only stay overnight if medically necessary. 6. You MUST have a responsible adult to drive you home. 7. An adult MUST be with you the first 24 hours after you arrive home. 8. Bring a current list of your medications, and the last time and date medication taken. 9. Bring ID and current insurance cards. 10.Please wear clothes that  are easy to get on and off and wear slip-on shoes.  Thank you for allowing [place] to care for you!   -- [facility]       1st Floor: - Lobby - Registration  - Pharmacy  - Lab - Cafe  2nd Floor: - PV Lab - Diagnostic Testing (echo, CT, nuclear med)  3rd Floor: - Vacant  4th Floor: - TCTS (cardiothoracic surgery) - AFib Clinic - Structural Heart Clinic - Vascular Surgery  - Vascular Ultrasound  5th Floor: - HeartCare Cardiology (general and EP) - Clinical Pharmacy for  coumadin, hypertension, lipid, weight-loss medications, and med management appointments    Valet parking services will be available as well.

## 2023-10-22 ENCOUNTER — Telehealth: Payer: Self-pay | Admitting: *Deleted

## 2023-10-22 NOTE — Telephone Encounter (Signed)
 Cardiac Catheterization scheduled at [facility] for: Wednesday October 24, 2023 8:30 AM Arrival time [facility] Main Entrance A at: 6:30 AM  Nothing to eat after midnight prior to procedure, clear liquids until 5 AM day of procedure.  Medication instructions: -Hold:  Eliquis-none 10/22/23 until post procedure -Other usual morning medications can be taken with sips of water including aspirin 81 mg.  Patient reports she is not currently taking metformin.  Plan to go home the same day, you will only stay overnight if medically necessary.  You must have responsible adult to drive you home.  Someone must be with you the first 24 hours after you arrive home.  Reviewed procedure instructions with patient.

## 2023-10-24 ENCOUNTER — Ambulatory Visit (HOSPITAL_COMMUNITY)
Admission: RE | Admit: 2023-10-24 | Discharge: 2023-10-24 | Disposition: A | Discharge status: Home / Self Care | Attending: Cardiology | Admitting: Cardiology

## 2023-10-24 ENCOUNTER — Other Ambulatory Visit: Payer: Self-pay

## 2023-10-24 ENCOUNTER — Encounter (HOSPITAL_COMMUNITY)
Admission: RE | Disposition: A | Discharge status: Home / Self Care | Payer: Self-pay | Source: Home / Self Care | Attending: Cardiology

## 2023-10-24 ENCOUNTER — Other Ambulatory Visit (HOSPITAL_COMMUNITY): Payer: Self-pay

## 2023-10-24 DIAGNOSIS — Z7901 Long term (current) use of anticoagulants: Secondary | ICD-10-CM | POA: Insufficient documentation

## 2023-10-24 DIAGNOSIS — I480 Paroxysmal atrial fibrillation: Secondary | ICD-10-CM | POA: Diagnosis not present

## 2023-10-24 DIAGNOSIS — E785 Hyperlipidemia, unspecified: Secondary | ICD-10-CM | POA: Diagnosis not present

## 2023-10-24 DIAGNOSIS — Z79899 Other long term (current) drug therapy: Secondary | ICD-10-CM | POA: Diagnosis not present

## 2023-10-24 DIAGNOSIS — I350 Nonrheumatic aortic (valve) stenosis: Secondary | ICD-10-CM | POA: Diagnosis not present

## 2023-10-24 DIAGNOSIS — I10 Essential (primary) hypertension: Secondary | ICD-10-CM | POA: Insufficient documentation

## 2023-10-24 DIAGNOSIS — E039 Hypothyroidism, unspecified: Secondary | ICD-10-CM | POA: Diagnosis not present

## 2023-10-24 DIAGNOSIS — E119 Type 2 diabetes mellitus without complications: Secondary | ICD-10-CM | POA: Insufficient documentation

## 2023-10-24 DIAGNOSIS — I080 Rheumatic disorders of both mitral and aortic valves: Secondary | ICD-10-CM | POA: Insufficient documentation

## 2023-10-24 DIAGNOSIS — G459 Transient cerebral ischemic attack, unspecified: Secondary | ICD-10-CM

## 2023-10-24 HISTORY — PX: RIGHT HEART CATH AND CORONARY ANGIOGRAPHY: CATH118264

## 2023-10-24 LAB — POCT I-STAT 7, (LYTES, BLD GAS, ICA,H+H)
Acid-base deficit: 2 mmol/L (ref 0.0–2.0)
Bicarbonate: 21.9 mmol/L (ref 20.0–28.0)
Calcium, Ion: 1.12 mmol/L — ABNORMAL LOW (ref 1.15–1.40)
HCT: 30 % — ABNORMAL LOW (ref 36.0–46.0)
Hemoglobin: 10.2 g/dL — ABNORMAL LOW (ref 12.0–15.0)
O2 Saturation: 93 %
Potassium: 3.6 mmol/L (ref 3.5–5.1)
Sodium: 142 mmol/L (ref 135–145)
TCO2: 23 mmol/L (ref 22–32)
pCO2 arterial: 33.3 mmHg (ref 32–48)
pH, Arterial: 7.425 (ref 7.35–7.45)
pO2, Arterial: 63 mmHg — ABNORMAL LOW (ref 83–108)

## 2023-10-24 LAB — GLUCOSE, CAPILLARY
Glucose-Capillary: 138 mg/dL — ABNORMAL HIGH (ref 70–99)
Glucose-Capillary: 136 mg/dL — ABNORMAL HIGH (ref 70–99)

## 2023-10-24 LAB — POCT I-STAT EG7
Acid-Base Excess: 0 mmol/L (ref 0.0–2.0)
Acid-base deficit: 1 mmol/L (ref 0.0–2.0)
Bicarbonate: 24 mmol/L (ref 20.0–28.0)
Bicarbonate: 25 mmol/L (ref 20.0–28.0)
Calcium, Ion: 1.16 mmol/L (ref 1.15–1.40)
Calcium, Ion: 1.17 mmol/L (ref 1.15–1.40)
HCT: 31 % — ABNORMAL LOW (ref 36.0–46.0)
HCT: 32 % — ABNORMAL LOW (ref 36.0–46.0)
Hemoglobin: 10.5 g/dL — ABNORMAL LOW (ref 12.0–15.0)
Hemoglobin: 10.9 g/dL — ABNORMAL LOW (ref 12.0–15.0)
O2 Saturation: 62 %
O2 Saturation: 64 %
Potassium: 3.7 mmol/L (ref 3.5–5.1)
Potassium: 3.7 mmol/L (ref 3.5–5.1)
Sodium: 142 mmol/L (ref 135–145)
Sodium: 142 mmol/L (ref 135–145)
TCO2: 25 mmol/L (ref 22–32)
TCO2: 26 mmol/L (ref 22–32)
pCO2, Ven: 39.7 mmHg — ABNORMAL LOW (ref 44–60)
pCO2, Ven: 40.5 mmHg — ABNORMAL LOW (ref 44–60)
pH, Ven: 7.389 (ref 7.25–7.43)
pH, Ven: 7.398 (ref 7.25–7.43)
pO2, Ven: 32 mmHg (ref 32–45)
pO2, Ven: 33 mmHg (ref 32–45)

## 2023-10-24 SURGERY — RIGHT HEART CATH AND CORONARY ANGIOGRAPHY
Anesthesia: LOCAL

## 2023-10-24 MED ORDER — IOHEXOL 350 MG/ML SOLN
INTRAVENOUS | Status: DC | PRN
  Administered 2023-10-24: 15 mL

## 2023-10-24 MED ORDER — LIDOCAINE HCL (PF) 1 % IJ SOLN
INTRAMUSCULAR | Status: AC
  Filled 2023-10-24: qty 30

## 2023-10-24 MED ORDER — HEPARIN SODIUM (PORCINE) 1000 UNIT/ML IJ SOLN
INTRAMUSCULAR | Status: DC | PRN
  Administered 2023-10-24: 4000 [IU] via INTRAVENOUS

## 2023-10-24 MED ORDER — SODIUM CHLORIDE 0.9 % WEIGHT BASED INFUSION: 1.0000 mL/kg/h | INTRAVENOUS | Status: DC

## 2023-10-24 MED ORDER — HEPARIN (PORCINE) IN NACL 2-0.9 UNITS/ML
INTRAMUSCULAR | Status: DC | PRN
  Administered 2023-10-24: 10 mL via INTRA_ARTERIAL

## 2023-10-24 MED ORDER — FENTANYL CITRATE (PF) 100 MCG/2ML IJ SOLN
INTRAMUSCULAR | Status: AC
  Filled 2023-10-24: qty 2

## 2023-10-24 MED ORDER — ONDANSETRON HCL 4 MG/2ML IJ SOLN: 4.0000 mg | Freq: Four times a day (QID) | INTRAMUSCULAR | Status: DC | PRN

## 2023-10-24 MED ORDER — ACETAMINOPHEN 325 MG PO TABS: 650.0000 mg | ORAL_TABLET | ORAL | Status: DC | PRN

## 2023-10-24 MED ORDER — MIDAZOLAM HCL 2 MG/2ML IJ SOLN
INTRAMUSCULAR | Status: DC | PRN
  Administered 2023-10-24: 1 mg via INTRAVENOUS

## 2023-10-24 MED ORDER — APIXABAN 5 MG PO TABS
5.0000 mg | ORAL_TABLET | Freq: Two times a day (BID) | ORAL | 1 refills | Status: DC
  Filled 2023-10-24 – 2024-02-11 (×2): qty 180, 90d supply, fill #0

## 2023-10-24 MED ORDER — HEPARIN SODIUM (PORCINE) 1000 UNIT/ML IJ SOLN
INTRAMUSCULAR | Status: AC
  Filled 2023-10-24: qty 10

## 2023-10-24 MED ORDER — MIDAZOLAM HCL 2 MG/2ML IJ SOLN
INTRAMUSCULAR | Status: AC
  Filled 2023-10-24: qty 2

## 2023-10-24 MED ORDER — ASPIRIN 81 MG PO CHEW: 81.0000 mg | CHEWABLE_TABLET | ORAL | Status: DC

## 2023-10-24 MED ORDER — SODIUM CHLORIDE 0.9% FLUSH: 3.0000 mL | INTRAVENOUS | Status: DC | PRN

## 2023-10-24 MED ORDER — HEPARIN (PORCINE) IN NACL 2000-0.9 UNIT/L-% IV SOLN
INTRAVENOUS | Status: DC | PRN
  Administered 2023-10-24: 1000 mL

## 2023-10-24 MED ORDER — SODIUM CHLORIDE 0.9 % WEIGHT BASED INFUSION: 3.0000 mL/kg/h | INTRAVENOUS | Status: AC

## 2023-10-24 MED ORDER — SODIUM CHLORIDE 0.9 % IV SOLN: 250.0000 mL | INTRAVENOUS | Status: DC | PRN

## 2023-10-24 MED ORDER — LIDOCAINE HCL (PF) 1 % IJ SOLN
INTRAMUSCULAR | Status: DC | PRN
  Administered 2023-10-24 (×2): 2 mL

## 2023-10-24 MED ORDER — FENTANYL CITRATE (PF) 100 MCG/2ML IJ SOLN
INTRAMUSCULAR | Status: DC | PRN
  Administered 2023-10-24: 25 ug via INTRAVENOUS

## 2023-10-24 MED ORDER — VERAPAMIL HCL 2.5 MG/ML IV SOLN
INTRAVENOUS | Status: AC
  Filled 2023-10-24: qty 2

## 2023-10-24 SURGICAL SUPPLY — 11 items
CATH BALLN WEDGE 5F 110CM (CATHETERS) IMPLANT
CATH INFINITI AMBI 5FR TG (CATHETERS) IMPLANT
DEVICE RAD TR BAND REGULAR (VASCULAR PRODUCTS) IMPLANT
GLIDESHEATH SLEND A-KIT 6F 22G (SHEATH) IMPLANT
GUIDEWIRE ANGLED .035X150CM (WIRE) IMPLANT
GUIDEWIRE INQWIRE 1.5J.035X260 (WIRE) IMPLANT
INQWIRE 1.5J .035X260CM (WIRE) ×1 IMPLANT
PACK CARDIAC CATHETERIZATION (CUSTOM PROCEDURE TRAY) ×1 IMPLANT
SET ATX-X65L (MISCELLANEOUS) IMPLANT
SHEATH GLIDE SLENDER 4/5FR (SHEATH) IMPLANT
SHEATH PROBE COVER 6X72 (BAG) IMPLANT

## 2023-10-24 NOTE — Interval H&P Note (Signed)
 History and Physical Interval Note:  10/24/2023 8:51 AM  Dana Sandoval  has presented today for surgery, with the diagnosis of aortic stenosis.  The various methods of treatment have been discussed with the patient and family. After consideration of risks, benefits and other options for treatment, the patient has consented to  Procedure(s): RIGHT/LEFT HEART CATH AND CORONARY ANGIOGRAPHY (N/A) as a surgical intervention.  The patient's history has been reviewed, patient examined, no change in status, stable for surgery.  I have reviewed the patient's chart and labs.  Questions were answered to the patient's satisfaction.     [name]

## 2023-10-24 NOTE — Progress Notes (Signed)
 Called into Cath Lab needing post cath orders and when patient can restart Eliquis? Per Britta Candy / Dr. Ganji Eliquis can be restarted tonight.

## 2023-10-25 ENCOUNTER — Encounter (HOSPITAL_COMMUNITY): Payer: Self-pay | Admitting: Cardiology

## 2023-10-28 IMAGING — MR MR HEAD W/O CM
12 of 13 series · 44 of 48 positions shown · non-contrast
Comparison: CT head 09/28/2021

CLINICAL DATA: TIA.  Confusion yesterday.

EXAM:
MRI HEAD WITHOUT CONTRAST
TECHNIQUE: Multiplanar, multiecho pulse sequences of the brain and surrounding
structures were obtained without intravenous contrast.

[Series 5: DWI · axial · 3.0mm · 0.88mm/px · z∈[-109,+31]mm · 8 of 96 slices shown (1 of 4)]
[im 1/96]
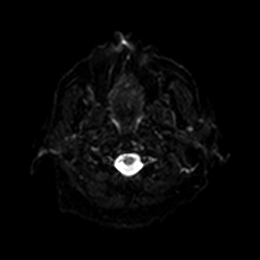
[im 14/96]
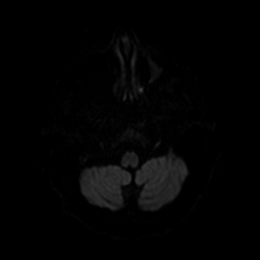
[im 28/96]
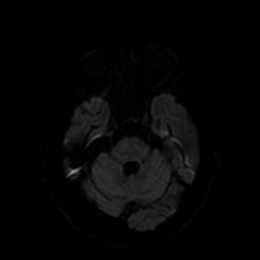
[im 41/96]
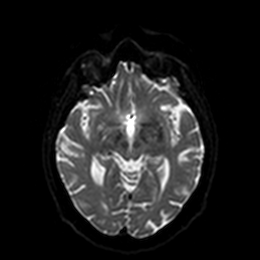
[im 55/96]
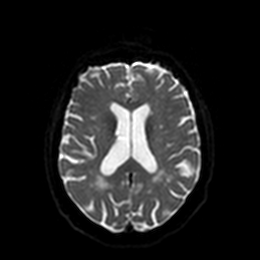
[im 68/96]
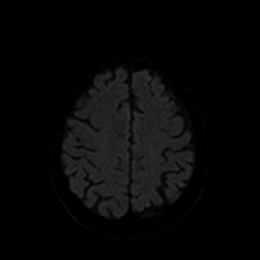
[im 82/96]
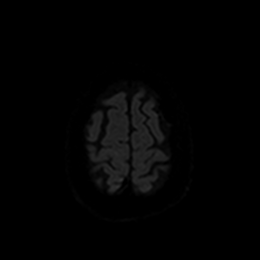
[im 96/96]
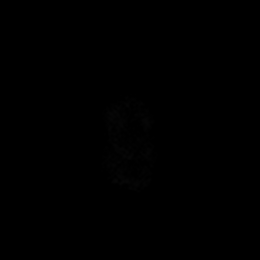

[Series 6: DWI · axial · 3.0mm · 0.88mm/px · z∈[-109,+31]mm · 4 of 48 slices shown (2 of 4)]
[im 1/48]
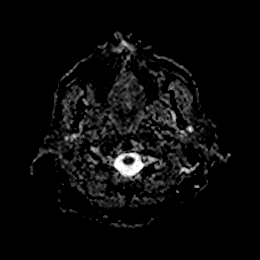
[im 16/48]
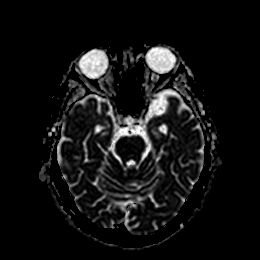
[im 32/48]
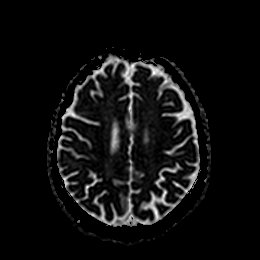
[im 48/48]
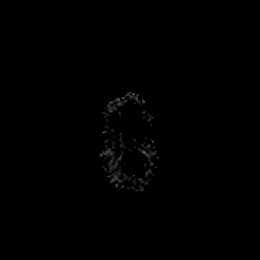

[Series 7: DWI · coronal · 4.0mm · 0.88mm/px · 5 of 68 slices shown (3 of 4)]
[im 1/68]
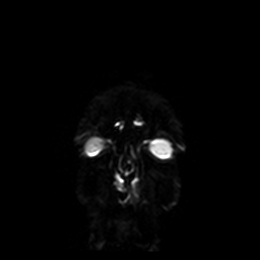
[im 17/68]
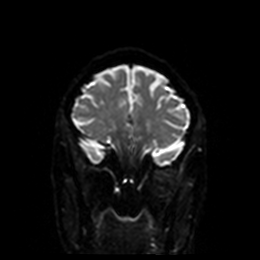
[im 34/68]
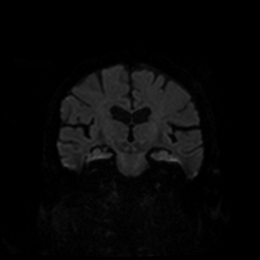
[im 51/68]
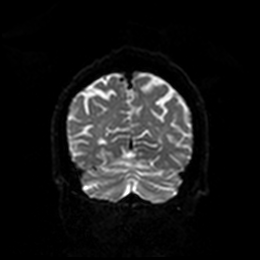
[im 68/68]
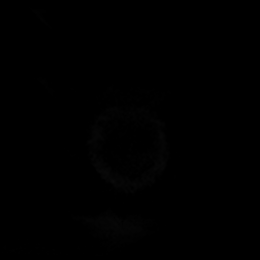

[Series 8: DWI · coronal · 4.0mm · 0.88mm/px · 3 of 34 slices shown (4 of 4)]
[im 1/34]
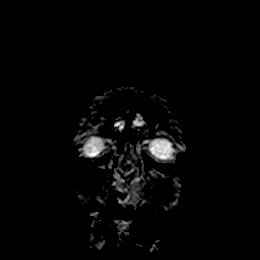
[im 17/34]
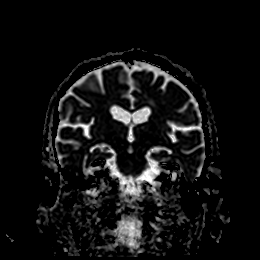
[im 34/34]
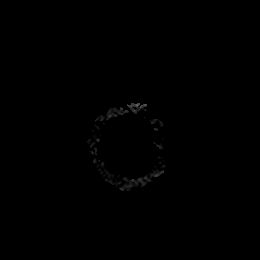

[Series 9: T1 · sagittal · 5.0mm · 0.75mm/px · 2 of 23 slices shown]
[im 1/23]
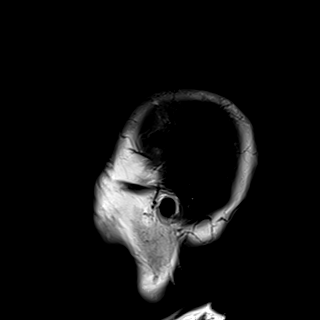
[im 23/23]
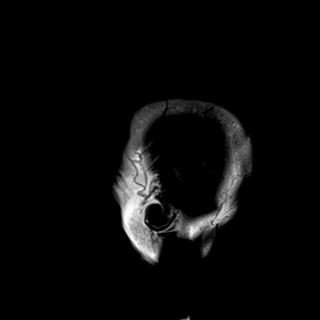

[Series 10: T2 · axial · 5.0mm · 0.72mm/px · z∈[-110,+32]mm · 2 of 25 slices shown (1 of 2)]
[im 1/25]
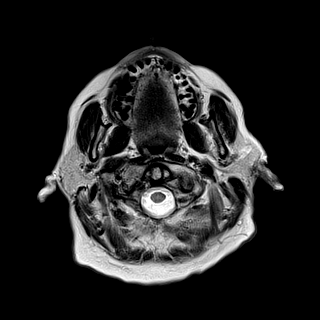
[im 25/25]
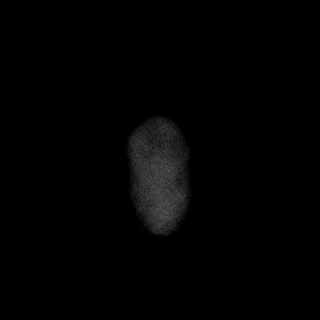

[Series 11: FLAIR · axial · 5.0mm · 0.45mm/px · z∈[-107,+35]mm · 2 of 25 slices shown]
[im 1/25]
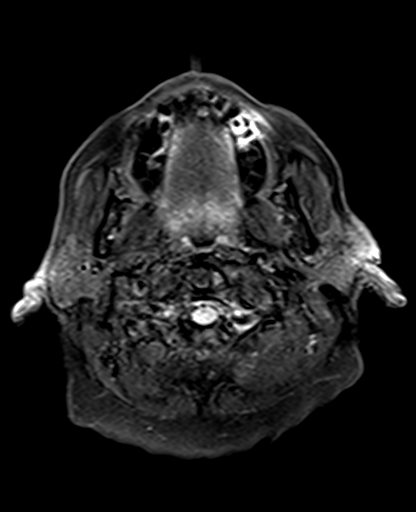
[im 25/25]
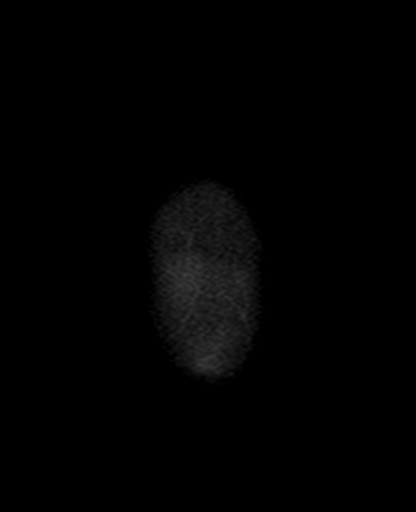

[Series 12: mag_images · axial · 3.0mm · 0.90mm/px · z∈[-112,+39]mm · 4 of 52 slices shown]
[im 1/52]
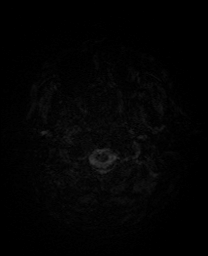
[im 18/52]
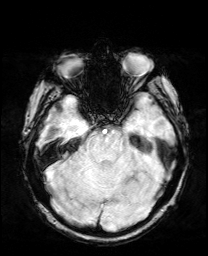
[im 35/52]
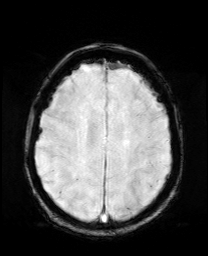
[im 52/52]
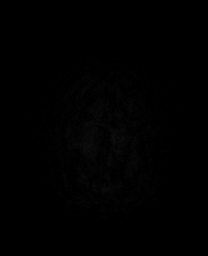

[Series 13: pha_images · axial · 3.0mm · 0.90mm/px · z∈[-112,+36]mm · 4 of 51 slices shown]
[im 1/51]
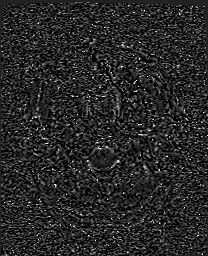
[im 17/51]
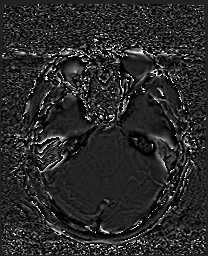
[im 34/51]
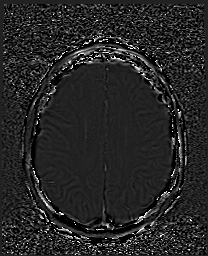
[im 51/51]
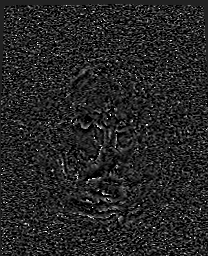

[Series 14: swi_images · axial · 3.0mm · 0.90mm/px · z∈[-112,+39]mm · 4 of 52 slices shown]
[im 1/52]
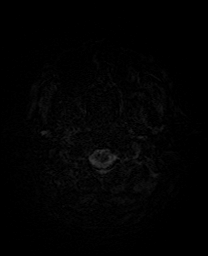
[im 18/52]
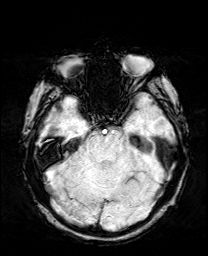
[im 35/52]
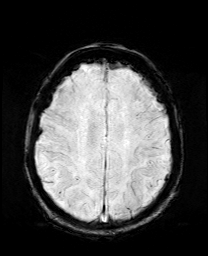
[im 52/52]
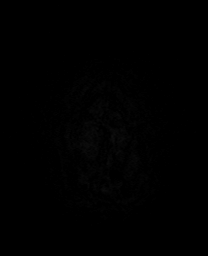

[Series 15: mip_images(sw) · axial · 24.0mm · 0.90mm/px · z∈[-101,+29]mm · 4 of 45 slices shown]
[im 1/45]
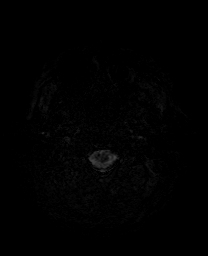
[im 15/45]
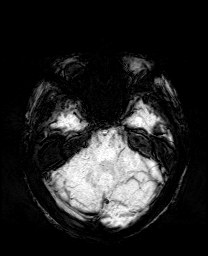
[im 30/45]
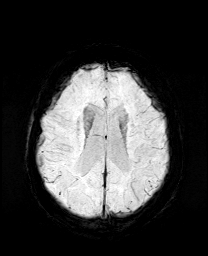
[im 45/45]
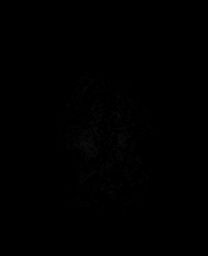

[Series 17: T2 · coronal · 5.0mm · 0.34mm/px · 2 of 29 slices shown (2 of 2)]
[im 1/29]
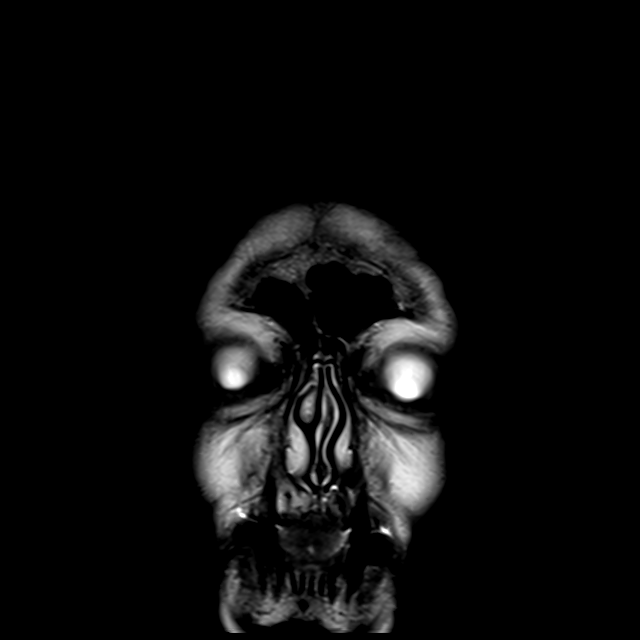
[im 29/29]
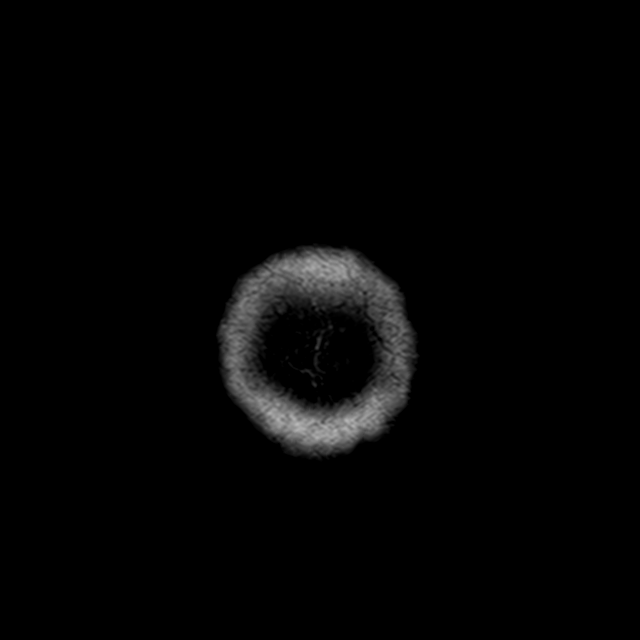

[44 of 48 positions shown; findings below may reference images not displayed]

FINDINGS: Brain: No acute infarction, hemorrhage, hydrocephalus, extra-axial
collection or mass lesion.

Mild atrophy. Mild white matter changes with scattered small white
matter hyperintensities bilaterally.

Vascular: Normal arterial flow voids at the skull base.

Skull and upper cervical spine: Negative

Sinuses/Orbits: Contracted left maxillary sinus which is fully
opacified. Remaining sinuses clear. No orbital lesion.

Other: None
IMPRESSION: No acute infarct identified

Mild atrophy and mild chronic microvascular ischemic change in the
white matter.

## 2023-10-28 IMAGING — CT CT ANGIO HEAD-NECK (W OR W/O PERF)
2 of 7 series · 8 of 33 positions shown · IV contrast (APPLIED)
Comparison: None.

CLINICAL DATA: Transient speech difficulty

EXAM:
CT ANGIOGRAPHY HEAD AND NECK
TECHNIQUE: Multidetector CT imaging of the head and neck was performed using
the standard protocol during bolus administration of intravenous
contrast. Multiplanar CT image reconstructions and MIPs were
obtained to evaluate the vascular anatomy. Carotid stenosis
measurements (when applicable) are obtained utilizing NASCET
criteria, using the distal internal carotid diameter as the
denominator.

[Series 5: cta neck/head (person_name) · axial · 0.53mm/px · z∈[-218,-110]mm · 2 of 163 slices shown]
[im 55/163  soft-tissue]
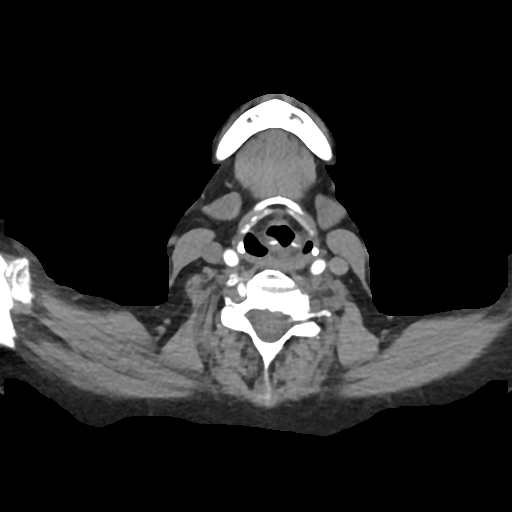
[im 109/163  soft-tissue]
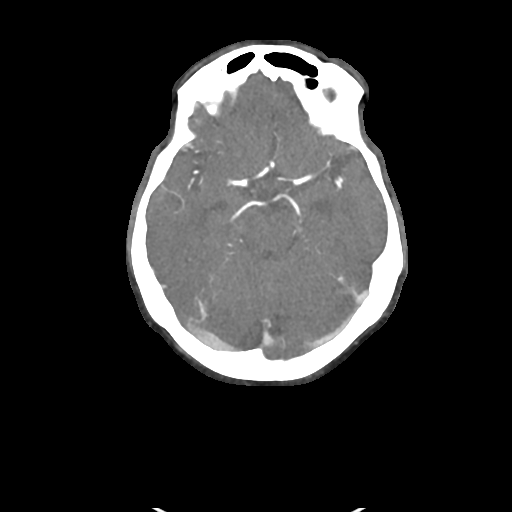

[Series 7: ax thins · axial · 0.37mm/px · z∈[-279,-49]mm · 6 of 323 slices shown]
[im 47/323  soft-tissue]
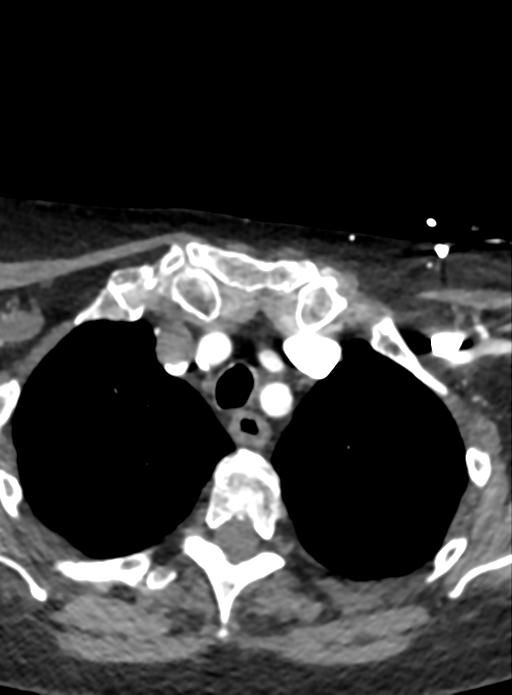
[im 93/323  bone]
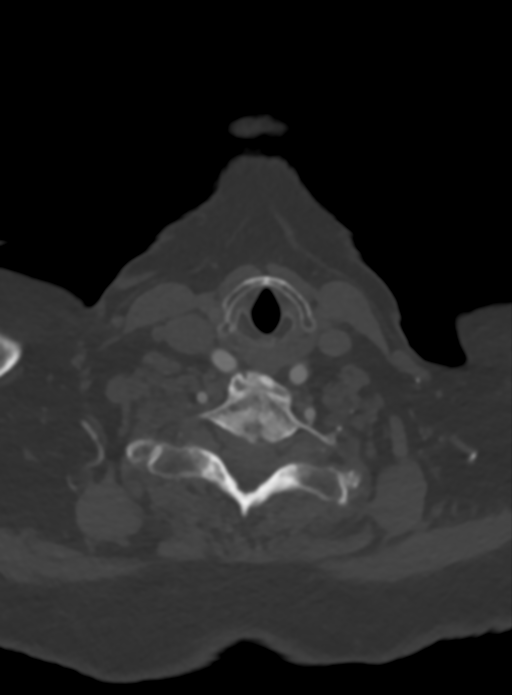
[im 139/323  soft-tissue]
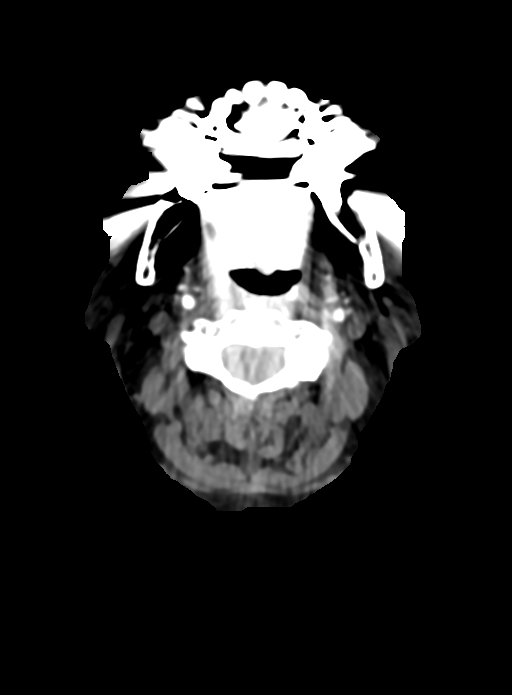
[im 185/323  bone]
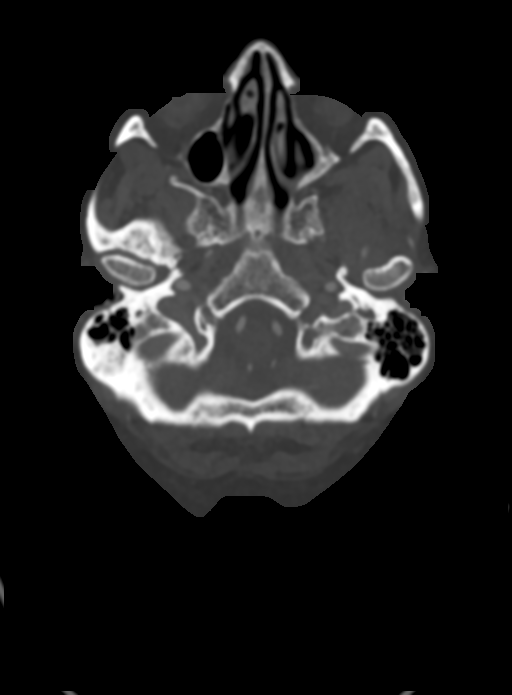
[im 231/323  soft-tissue]
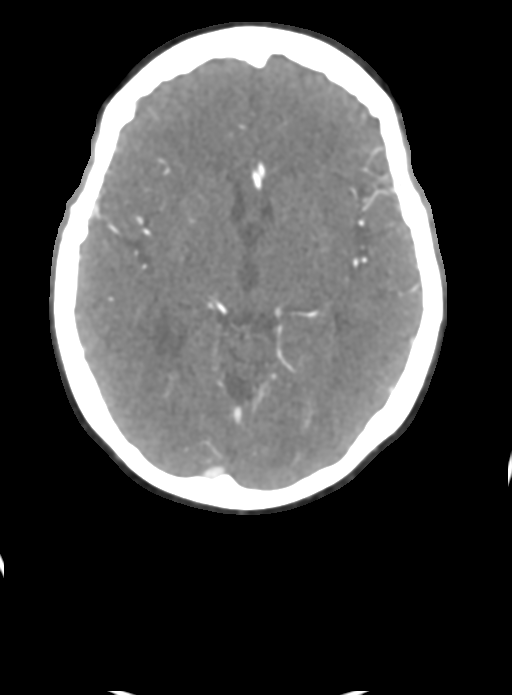
[im 277/323  bone]
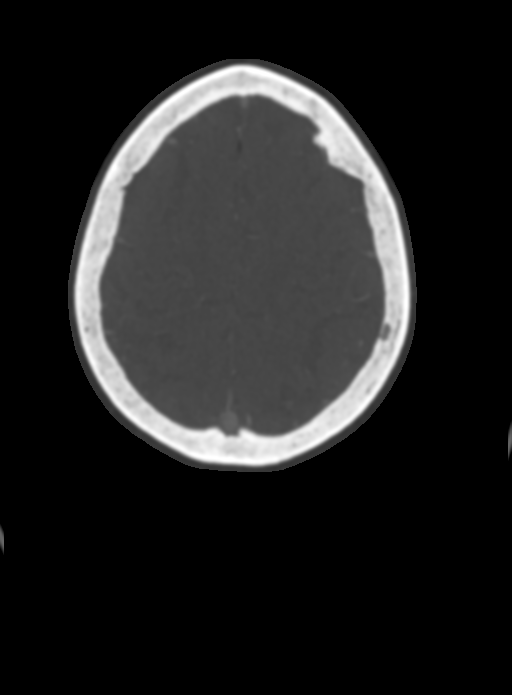

[8 of 33 positions shown; findings below may reference images not displayed]

RADIATION DOSE REDUCTION: This exam was performed according to the
departmental dose-optimization program which includes automated
exposure control, adjustment of the mA and/or kV according to
patient size and/or use of iterative reconstruction technique.

CONTRAST:  75mL OMNIPAQUE IOHEXOL 350 MG/ML SOLN
FINDINGS: CTA NECK FINDINGS

SKELETON: There is no bony spinal canal stenosis. No lytic or
blastic lesion.

OTHER NECK: Normal pharynx, larynx and major salivary glands. No
cervical lymphadenopathy. Unremarkable thyroid gland.

UPPER CHEST: No pneumothorax or pleural effusion. No nodules or
masses.

AORTIC ARCH:

There is no calcific atherosclerosis of the aortic arch. There is no
aneurysm, dissection or hemodynamically significant stenosis of the
visualized portion of the aorta. Conventional 3 vessel aortic
branching pattern. The visualized proximal subclavian arteries are
widely patent.

RIGHT CAROTID SYSTEM: Normal without aneurysm, dissection or
stenosis.

LEFT CAROTID SYSTEM: Normal without aneurysm, dissection or
stenosis.

VERTEBRAL ARTERIES: Left dominant configuration. Both origins are
clearly patent. There is no dissection, occlusion or flow-limiting
stenosis to the skull base (V1-V3 segments).

CTA HEAD FINDINGS

POSTERIOR CIRCULATION:

--Vertebral arteries: Normal V4 segments.

--Inferior cerebellar arteries: Normal.

--Basilar artery: Normal.

--Superior cerebellar arteries: Normal.

--Posterior cerebral arteries (PCA): Normal.

ANTERIOR CIRCULATION:

--Intracranial internal carotid arteries: Normal.

--Anterior cerebral arteries (ACA): Normal. Both A1 segments are
present. Patent anterior communicating artery (a-comm).

--Middle cerebral arteries (MCA): Normal.

VENOUS SINUSES: As permitted by contrast timing, patent.

ANATOMIC VARIANTS: None

Review of the MIP images confirms the above findings.
IMPRESSION: No emergent large vessel occlusion or high-grade stenosis of the
intracranial or cervical arteries.

## 2023-10-30 ENCOUNTER — Encounter: Payer: Self-pay | Admitting: Family Medicine

## 2023-10-30 ENCOUNTER — Telehealth: Payer: Self-pay | Admitting: Cardiology

## 2023-10-30 DIAGNOSIS — I080 Rheumatic disorders of both mitral and aortic valves: Secondary | ICD-10-CM

## 2023-10-30 DIAGNOSIS — I350 Nonrheumatic aortic (valve) stenosis: Secondary | ICD-10-CM

## 2023-10-30 NOTE — Telephone Encounter (Signed)
 Pt is calling to check about scheduling procedure

## 2023-10-30 NOTE — Telephone Encounter (Signed)
 Patient states she has been discussing with Dr. Berry Bristol valve repair for aortic stenosis. She recently had heart cath.  Patient states Dr. Berry Bristol mentioned having what sounds like a TEE performed. She would like to know what are the next steps. Does she need to proceed with TEE now or wait until appt with Dr. Berry Bristol.  Will forward to Dr. Maida Sciara nurse to follow-up with patient.

## 2023-10-30 NOTE — Telephone Encounter (Signed)
 Reviewed with Dr Berry Bristol and he would like patient to have TAVR consult.  Patient notified.  She is aware she will be called with appointment date

## 2023-11-02 ENCOUNTER — Other Ambulatory Visit (HOSPITAL_COMMUNITY): Payer: Self-pay

## 2023-11-06 NOTE — Progress Notes (Signed)
 Patient ID: FRANCESSCA JANUARY MRN: [id number] DOB/AGE: 1940/08/15 83 y.o.  Primary Care Physician:Duncan, Gwynda Leriche, MD Primary Cardiologist: Berry Bristol  CC:  Aortic valvular disease management     FOCUSED PROBLEM LIST:   Aortic stenosis AVA 0.41, MG 48, V-max 4.4, EF 60 to 65% TTE April 2025 EKG AF without BBBs No CAD; cath April 2025 Mitral valvular disease Moderate mitral regurgitation and mild mitral stenosis with severe MAC TTE April 2025 PAF On apixaban  Hypertension Hypothyroidism CKD stage II BMI 31/BSA 1.15 Nov 2023:  Patient consents to use of AI scribe. The patient is an 83 year old female with the above listed medical problems referred for recommendations regarding the patient's aortic and valvular disease by Dr. Berry Bristol.  The patient saw Dr. Berry Bristol recently.  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  She also had mild to moderate mitral regurgitation with associated severe MAC.  She underwent cardiac catheterization which demonstrated no significant obstructive disease.  Of note her filling pressures were elevated with a mean RA of 14 mmHg mean PA of 44 mmHg, mean wedge pressure of 33 mmHg with V waves to 54 mmHg and a PVR consistent with pulmonary venous hypertension.  She has experienced worsening shortness of breath over the past week and a half, following a heart catheterization procedure. Prior to the procedure, she did not have shortness of breath. The symptom is severe enough to make walking from one room to another difficult, and she now sleeps in a chair due to discomfort when lying flat.  She has a history of atrial fibrillation and is currently on a blood thinner, a cholesterol medication, a thyroid  medication, and metoprolol . Recently, she was started on amiodarone  for atrial fibrillation. She was previously on metformin , which was discontinued by another doctor.  An ultrasound of the heart showed a worn-out aortic valve and a  slightly leaky mitral valve. A heart catheterization revealed high fluid levels in the heart and lungs.  No chest pain, lightheadedness, blackouts, or leg swelling. She mentions a sensation in her throat when lying on her right side, described as a 'squeak' or 'little noises,' and sometimes coughs to relieve it, though she does not produce sputum.  Her main difficulty is performing physical activities without stopping due to shortness of breath, impacting her ability to perform household tasks without taking breaks.  She is usually quite active.  This decline in her functional capacity is relatively new.  No dental symptoms.       Past Medical History:  Diagnosis Date   Aortic stenosis    Arthritis    B12 deficiency    Diabetes mellitus    type 2   Heart murmur    aortic stenosis   Hyperlipidemia    Hypertension    Hypothyroidism    IBS (irritable bowel syndrome)    Macular degeneration    Macular degeneration of right eye    NSVD (normal spontaneous vaginal delivery)    X 4   Pain    INTRASCAPULAR PAIN   SAB (spontaneous abortion)    X 2   Thyroid  disease    HYPOTHYROIDISM    Past Surgical History:  Procedure Laterality Date   ABDOMINAL HYSTERECTOMY  1977   TAH  (FIBROIDS)   CATARACT EXTRACTION Bilateral    DILATION AND CURETTAGE OF UTERUS     DOPPLER ECHOCARDIOGRAPHY  2009   HAD AN ECHOCARDIOGRAM THAT SHOWED MODERATE MITRAL  ANNULAR CALCIFICATION AND A NORMAL EJECTION FRACTION OF  55%   RIGHT HEART CATH AND CORONARY ANGIOGRAPHY N/A 10/24/2023   Procedure: RIGHT HEART CATH AND CORONARY ANGIOGRAPHY;  Surgeon: [name], MD;  Location: [facility];  Service: Cardiovascular;  Laterality: N/A;   SHOULDER SURGERY     RIGHT SHOULDER SURGERY   THYROIDECTOMY     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   RIGHT   TOTAL KNEE ARTHROPLASTY Left 09/10/2018   Procedure: TOTAL KNEE ARTHROPLASTY;  Surgeon: Dayne Even, MD;  Location: [facility];  Service: Orthopedics;   Laterality: Left;    Family History  Problem Relation Age of Onset   Osteoporosis Mother    Heart disease Mother    Macular degeneration Mother    Heart disease Father    Cancer Father        BRAIN TUMOR   Cancer Brother        BRAIN TUMOR   Diabetes Maternal Aunt    Diabetes Maternal Uncle    Stroke Paternal Grandfather    Colon cancer Neg Hx    Breast cancer Neg Hx    Migraines Neg Hx     Social History   Socioeconomic History   Marital status: Married    Spouse name: Not on file   Number of children: 4   Years of education: Not on file   Highest education level: Not on file  Occupational History   Not on file  Tobacco Use   Smoking status: Never   Smokeless tobacco: Never  Vaping Use   Vaping status: Never Used  Substance and Sexual Activity   Alcohol use: No    Alcohol/week: 0.0 standard drinks of alcohol   Drug use: No   Sexual activity: Yes    Partners: Male    Birth control/protection: Surgical    Comment: 1st intercourse- 17, partners- 1, married- 57 yrs   Other Topics Concern   Not on file  Social History Narrative   Lives with husband, Franchot Ion- daughter, documents at home   Married 1960   Social Drivers of Health   Financial Resource Strain: Low Risk  (10/01/2023)   Overall Financial Resource Strain (CARDIA)    Difficulty of Paying Living Expenses: Not hard at all  Food Insecurity: No Food Insecurity (10/01/2023)   Hunger Vital Sign    Worried About Running Out of Food in the Last Year: Never true    Ran Out of Food in the Last Year: Never true  Transportation Needs: No Transportation Needs (10/01/2023)   PRAPARE - Administrator, Civil Service (Medical): No    Lack of Transportation (Non-Medical): No  Physical Activity: Insufficiently Active (10/01/2023)   Exercise Vital Sign    Days of Exercise per Week: 1 day    Minutes of Exercise per Session: 10 min  Stress: No Stress Concern Present (10/01/2023)   [employer] of  Occupational Health - Occupational Stress Questionnaire    Feeling of Stress : Not at all  Social Connections: Moderately Integrated (10/01/2023)   Social Connection and Isolation Panel [NHANES]    Frequency of Communication with Friends and Family: More than three times a week    Frequency of Social Gatherings with Friends and Family: More than three times a week    Attends Religious Services: 1 to 4 times per year    Active Member of [employer] or Organizations: No    Attends Banker Meetings: Never    Marital Status: Married  Catering manager Violence: Not At Risk (  10/01/2023)   Humiliation, Afraid, Rape, and Kick questionnaire    Fear of Current or Ex-Partner: No    Emotionally Abused: No    Physically Abused: No    Sexually Abused: No     Prior to Admission medications   Medication Sig Start Date End Date Taking? Authorizing Provider  Accu-Chek Softclix Lancets lancets Use to check blood sugar daily. Dx E11.9 12/01/21   Donnie Galea, MD  amiodarone  (PACERONE ) 200 MG tablet Take 1 tablet (200 mg total) by mouth in the morning and at bedtime. 10/18/23   [name], MD  apixaban  (ELIQUIS ) 5 MG TABS tablet Take 1 tablet (5 mg total) by mouth 2 (two) times daily. 10/24/23   [name], MD  Ascorbic Acid (VITAMIN C PO) Take 500 mg by mouth in the morning.    [provider]  atorvastatin  (LIPITOR) 40 MG tablet Take 1 tablet (40 mg total) by mouth daily at 6PM. 10/16/23   Donnie Galea, MD  Blood Glucose Monitoring Suppl (ACCU-CHEK GUIDE) w/Device KIT Use to check blood sugar daily. Dx E11.9 12/01/21   Donnie Galea, MD  glucose blood (ACCU-CHEK GUIDE) test strip USE TO CHECK BLOOD SUGAR DAILY 09/26/22   Donnie Galea, MD  levothyroxine  (SYNTHROID ) 88 MCG tablet Take 1 tablet (88 mcg total) by mouth daily. 10/16/23   Donnie Galea, MD  metoprolol  succinate (TOPROL -XL) 25 MG 24 hr tablet Take 1 tablet (25 mg total) by mouth daily. Take with or immediately following a  meal. 10/18/23 01/16/24  [name], MD  Multiple Vitamins-Minerals (PRESERVISION AREDS 2 PO) Take 1 tablet by mouth 2 (two) times daily.     [provider]  valACYclovir  (VALTREX ) 500 MG tablet Take 1 tablet (500 mg total) by mouth daily as needed (fever blister). 10/16/23   Donnie Galea, MD  vitamin B-12 (CYANOCOBALAMIN ) 1000 MCG tablet Take 1 tablet (1,000 mcg total) by mouth daily. 09/15/21   Donnie Galea, MD  VITAMIN D  PO Take 1,000 Units by mouth in the morning.    [provider]    Allergies  Allergen Reactions   Codeine Itching    After 3 days   Dilaudid [Hydromorphone Hcl] Itching    After 3 days    REVIEW OF SYSTEMS:  General: no fevers/chills/night sweats Eyes: no blurry vision, diplopia, or amaurosis ENT: no sore throat or hearing loss Resp: no cough, wheezing, or hemoptysis CV: no edema or palpitations GI: no abdominal pain, nausea, vomiting, diarrhea, or constipation GU: no dysuria, frequency, or hematuria Skin: no rash Neuro: no headache, numbness, tingling, or weakness of extremities Musculoskeletal: no joint pain or swelling Heme: no bleeding, DVT, or easy bruising Endo: no polydipsia or polyuria  BP 132/72   Pulse 71   Ht 5\' 2"  (1.575 m)   Wt 173 lb 3.2 oz (78.6 kg)   SpO2 96%   BMI 31.68 kg/m   PHYSICAL EXAM: GEN:  AO x 3 in no acute distress HEENT: normal Dentition: Normal Neck: JVP normal. +2 carotid upstrokes without bruits. No thyromegaly. Lungs: equal expansion, clear bilaterally CV: Apex is discrete and nondisplaced, RRR with 3/6 crescendo decrescendo murmur Abd: soft, non-tender, non-distended; no bruit; positive bowel sounds Ext: no edema, ecchymoses, or cyanosis Vascular: 2+ femoral pulses, 2+ radial pulses       Skin: warm and dry without rash Neuro: CN II-XII grossly intact; motor and sensory grossly intact    DATA AND STUDIES:  EKG:April 2025 AF with RVR,  no BBBs  EKG Interpretation Date/Time:    Ventricular  Rate:    PR Interval:    QRS Duration:    QT Interval:    QTC Calculation:   R Axis:      Text Interpretation:          Cardiac Studies & Procedures   ______________________________________________________________________________________________ CARDIAC CATHETERIZATION  CARDIAC CATHETERIZATION 10/24/2023  Conclusion Images from the original result were not included. Cardiac Catheterization 10/24/23: Hemodynamic data: RA 17/15, mean 14 mmHg. RV 67/12, EDP 17 mmHg. PA 68/18, mean 44 mmHg. PW 27/54, mean 33 mmHg.  Prominent V waves were evident may indicate significant mitral regurgitation. PA saturation 63%, aortic saturation 93%. CO 5.0, CI 2.83.  QP/QS 1.00.  PAPi 3.3.  Angiographic data: Normal coronary arteries, right dominant. LM: Large-caliber vessel, smooth and normal. LAD: Large-caliber vessel, gives origin to large D1 which is tortuous, mid to distal LAD is tortuous suggestive of hypertensive heart disease. RI: Large-caliber vessel, smooth and normal. LCx: Large-caliber vessel gives origin to large OM1.  Cx is tortuous. RCA: Large-caliber vessel, gives origin to large PDA and moderate-sized PL branch.  It is smooth and normal.    Impression and recommendations: Moderate to severe pulmonary hypertension with markedly elevated PCW suggestive of chronic diastolic heart failure.  PW waveforms suggest significant mitral regurgitation as well.  Will need follow-up TEE.  Aortic valve was not crossed.  Severe restricted movement of the aortic valve was evident fluoroscopically.  Severe mitral annular calcification. Normal coronary arteries, right dominant circulation.  Patient will be referred for TAVR evaluation.  Findings Coronary Findings Diagnostic  Dominance: Right  Left Main Vessel was injected. Vessel is normal in caliber. Vessel is angiographically normal.  Left Anterior Descending Vessel was injected. Vessel is normal in caliber. Vessel is angiographically  normal.  Ramus Intermedius Vessel was injected. Vessel is normal in caliber. Vessel is angiographically normal.  Left Circumflex Vessel was injected. Vessel is normal in caliber. Vessel is angiographically normal.  Right Coronary Artery Vessel was injected. Vessel is normal in caliber. Vessel is angiographically normal.  Intervention  No interventions have been documented.     ECHOCARDIOGRAM  ECHOCARDIOGRAM COMPLETE 10/11/2023  Narrative ECHOCARDIOGRAM REPORT    Patient Name:   FIORELLA WALLISCH Pendergraft Date of Exam: 10/11/2023 Medical Rec #:  [id number]        Height:       63.0 in Accession #:    [id number]       Weight:       170.0 lb Date of Birth:  Nov 23, 1940       BSA:          1.805 m Patient Age:    82 years         BP:           133/69 mmHg Patient Gender: F                HR:           88 bpm. Exam Location:  [address]  Procedure: 2D Echo, Cardiac Doppler, Color Doppler and Strain Analysis (Both Spectral and Color Flow Doppler were utilized during procedure).  Indications:    I08.00 mitral and Aortic valve disease  History:        Patient has prior history of Echocardiogram examinations, most recent 09/29/2021. Arrythmias:Atrial Fibrillation, Signs/Symptoms:Murmur; Risk Factors:Diabetes and Hypertension.  Sonographer:    Juventino Oppenheim RCS Referring Phys: [id number] [name]  IMPRESSIONS   1. Left ventricular ejection fraction,  by estimation, is 60 to 65%. The left ventricle has normal function. The left ventricle has no regional wall motion abnormalities. There is mild left ventricular hypertrophy of the basal-septal segment. Left ventricular diastolic parameters are indeterminate. Elevated left ventricular end-diastolic pressure. The average left ventricular global longitudinal strain is -16.0 %. The global longitudinal strain is abnormal. 2. Right ventricular systolic function is normal. The right ventricular size is normal. There is moderately elevated pulmonary  artery systolic pressure. The estimated right ventricular systolic pressure is 51.4 mmHg. 3. The mitral valve is degenerative. Mild to moderate mitral valve regurgitation. Mild mitral stenosis. The mean mitral valve gradient is 4.5 mmHg. Severe mitral annular calcification. 4. The aortic valve is calcified. There is severe calcifcation of the aortic valve. There is severe thickening of the aortic valve. Aortic valve regurgitation is trivial. Severe aortic valve stenosis. Aortic valve area, by VTI measures 0.41 cm. Aortic valve mean gradient measures 48.0 mmHg. Aortic valve Vmax measures 4.41 m/s. 5. The inferior vena cava is normal in size with greater than 50% respiratory variability, suggesting right atrial pressure of 3 mmHg. 6. Compared to study dated 09/29/21, the mean AVG has increased from 25 to , VMax has increased from 3.42 to 4.59m/s and DVI has decreased from 0.3 to 0.18.  FINDINGS Left Ventricle: Left ventricular ejection fraction, by estimation, is 60 to 65%. The left ventricle has normal function. The left ventricle has no regional wall motion abnormalities. The average left ventricular global longitudinal strain is -16.0 %. Strain was performed and the global longitudinal strain is abnormal. The left ventricular internal cavity size was normal in size. There is mild left ventricular hypertrophy of the basal-septal segment. Left ventricular diastolic parameters are indeterminate. Elevated left ventricular end-diastolic pressure.  Right Ventricle: The right ventricular size is normal. No increase in right ventricular wall thickness. Right ventricular systolic function is normal. There is moderately elevated pulmonary artery systolic pressure. The tricuspid regurgitant velocity is 3.48 m/s, and with an assumed right atrial pressure of 3 mmHg, the estimated right ventricular systolic pressure is 51.4 mmHg.  Left Atrium: Left atrial size was normal in size.  Right Atrium: Right  atrial size was normal in size.  Pericardium: There is no evidence of pericardial effusion.  Mitral Valve: The mitral valve is degenerative in appearance. There is moderate thickening of the mitral valve leaflet(s). There is mild calcification of the mitral valve leaflet(s). Severe mitral annular calcification. Mild to moderate mitral valve regurgitation. Mild mitral valve stenosis. MV peak gradient, 14.7 mmHg. The mean mitral valve gradient is 4.5 mmHg.  Tricuspid Valve: The tricuspid valve is normal in structure. Tricuspid valve regurgitation is mild . No evidence of tricuspid stenosis.  Aortic Valve: The aortic valve is calcified. There is severe calcifcation of the aortic valve. There is severe thickening of the aortic valve. Aortic valve regurgitation is trivial. Severe aortic stenosis is present. Aortic valve mean gradient measures 48.0 mmHg. Aortic valve peak gradient measures 77.8 mmHg. Aortic valve area, by VTI measures 0.41 cm.  Pulmonic Valve: The pulmonic valve was normal in structure. Pulmonic valve regurgitation is trivial. No evidence of pulmonic stenosis.  Aorta: The aortic root is normal in size and structure.  Venous: The inferior vena cava is normal in size with greater than 50% respiratory variability, suggesting right atrial pressure of 3 mmHg.  IAS/Shunts: No atrial level shunt detected by color flow Doppler.   LEFT VENTRICLE PLAX 2D LVIDd:         4.40 cm  Diastology LVIDs:         3.10 cm   LV e' medial:    8.49 cm/s LV PW:         1.00 cm   LV E/e' medial:  18.1 LV IVS:        1.20 cm   LV e' lateral:   9.36 cm/s LVOT diam:     1.70 cm   LV E/e' lateral: 16.5 LV SV:         49 LV SV Index:   27        2D Longitudinal Strain LVOT Area:     2.27 cm  2D Strain GLS (A4C):   -13.9 % 2D Strain GLS (A3C):   -20.1 % 2D Strain GLS (A2C):   -14.1 % 2D Strain GLS Avg:     -16.0 %  RIGHT VENTRICLE RV Basal diam:  3.30 cm RV S prime:     11.70 cm/s TAPSE (M-mode):  1.7 cm RVSP:           51.4 mmHg  LEFT ATRIUM           Index        RIGHT ATRIUM           Index LA diam:      3.90 cm 2.16 cm/m   RA Pressure: 3.00 mmHg LA Vol (A2C): 47.6 ml 26.38 ml/m  RA Area:     15.50 cm LA Vol (A4C): 51.1 ml 28.32 ml/m  RA Volume:   40.10 ml  22.22 ml/m AORTIC VALVE AV Area (Vmax):    0.40 cm AV Area (Vmean):   0.38 cm AV Area (VTI):     0.41 cm AV Vmax:           441.00 cm/s AV Vmean:          330.000 cm/s AV VTI:            1.190 m AV Peak Grad:      77.8 mmHg AV Mean Grad:      48.0 mmHg LVOT Vmax:         77.40 cm/s LVOT Vmean:        55.700 cm/s LVOT VTI:          0.214 m LVOT/AV VTI ratio: 0.18  AORTA Ao Root diam: 2.70 cm Ao Asc diam:  2.80 cm  MITRAL VALVE                  TRICUSPID VALVE MV Area (PHT): 5.46 cm       TR Peak grad:   48.4 mmHg MV Area VTI:   1.21 cm       TR Vmax:        348.00 cm/s MV Peak grad:  14.7 mmHg      Estimated RAP:  3.00 mmHg MV Mean grad:  4.5 mmHg       RVSP:           51.4 mmHg MV Vmax:       1.92 m/s MV Vmean:      90.4 cm/s      SHUNTS MV Decel Time: 139 msec       Systemic VTI:  0.21 m MR Peak grad:    98.4 mmHg    Systemic Diam: 1.70 cm MR Mean grad:    59.0 mmHg MR Vmax:         496.00 cm/s MR Vmean:        360.0 cm/s MR PISA:  2.26 cm MR PISA Eff ROA: 14 mm MR PISA Radius:  0.60 cm MV E velocity: 154.00 cm/s MV A velocity: 78.60 cm/s MV E/A ratio:  1.96  Gaylyn Keas MD Electronically signed by Gaylyn Keas MD Signature Date/Time: 10/11/2023/11:00:30 AM    Final    MONITORS  LONG TERM MONITOR (3-14 DAYS) 11/09/2021  Narrative [facility] Extended out patient EKG monitoring 13 days starting 10/18/2021: Patient had a min HR of 54 bpm, max HR of 191 bpm, and avg HR of 81 bpm. Predominant underlying rhythm was Sinus Rhythm. 1 run of Ventricular Tachycardia occurred lasting 4 beats with a max rate of 124 bpm (avg 117 bpm). Atrial Fibrillation occurred (5% burden), ranging from  65-191 bpm (avg of 128 bpm), the longest lasting 16 hours 12 mins with an avg rate of 128 bpm. Isolated SVEs were frequent (8.6%, 131424), rare atrial couplets and triplets. Rare PVCs, ventricular couplets and trigeminy. No symptoms reported.       ______________________________________________________________________________________________      10/09/2023: ALT 13; BUN 22; Creatinine, Ser 0.82; Platelets 539.0; TSH 1.15 10/24/2023: Hemoglobin 10.5; Potassium 3.7; Sodium 142   STS RISK CALCULATOR: Pending  NHYA CLASS: 2    ASSESSMENT AND PLAN:   1. Nonrheumatic aortic valve stenosis   2. Mitral valve disease   3. Paroxysmal atrial fibrillation (HCC)   4. Secondary hypercoagulable state (HCC)   5. Essential hypertension   6. CKD (chronic kidney disease) stage 2, GFR 60-89 ml/min     Aortic stenosis: The patient's filling pressures are elevated on her last right heart catheterization and she has developed increasing symptoms of shortness of breath and orthopnea.  Will start Lasix  20 mg twice daily and check a BMP next week.  Will refer for CTA and surgical evaluation. Mitral valve disease: The patient has developed mild to moderate mitral regurgitation from mitral annular calcification.  The MAC is quite prominent.  If she develops worsening mitral regurgitation and symptoms attributable to this then a TMVR valve in MAC could be considered depending on her anatomy.  For now this should be treated medically.  I believe the patient's exaggerated V waves are from LA noncompliance in the setting of volume overload with a wedge pressure of 33 mmHg and RA pressure of 14 mmHg..  Will start Lasix  20 mg twice daily as detailed above. Paroxysmal atrial fibrillation: Continue amiodarone  200 mg daily, Toprol  XL 25 mg daily, and Eliquis  5 mg twice daily. Secondary hypercoagulable state: Continue Eliquis  5 mg twice daily. Hypertension: Continue Toprol -XL 25 mg daily; blood pressure is  well-controlled. CKD stage II: Monitor for now; may benefit from ARB.   I have personally reviewed the patients imaging data as summarized above.  I have reviewed the natural history of aortic stenosis with the patient and family members who are present today. We have discussed the limitations of medical therapy and the poor prognosis associated with symptomatic aortic stenosis. We have also reviewed potential treatment options, including palliative medical therapy, conventional surgical aortic valve replacement, and transcatheter aortic valve replacement. We discussed treatment options in the context of this patient's specific comorbid medical conditions.   All of the patient's questions were answered today. Will make further recommendations based on the results of studies outlined above.   I spent 50 minutes reviewing all clinical data during and prior to this visit including all relevant imaging studies, laboratories, clinical information from other health systems and prior notes from both Cardiology and other specialties, interviewing the patient, conducting  a complete physical examination, and coordinating care in order to formulate a comprehensive and personalized evaluation and treatment plan.   Yacine Garriga K Uri Turnbough, MD  11/08/2023 11:01 AM    [facility] [address] [place], [place], [place]  [address] Phone: [phone]; Fax: [phone]

## 2023-11-07 NOTE — Progress Notes (Signed)
 [facility] - Clinic Note  11/09/2023     CHIEF COMPLAINT Patient presents for Retina Follow Up  HISTORY OF PRESENT ILLNESS: Dana Sandoval is a 83 y.o. 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  This started 6 weeks ago.  I, the attending physician,  performed the HPI with the patient and updated documentation appropriately.        Comments   Patient here for 6 weeks retina follow up for exu ARMD OU. Patient states vision about the same. No eye pain. Not using drops.       Last edited by Ronelle Coffee, MD on 11/09/2023  9:54 PM.    Patient states 2 weeks ago she had an EKG and found out that her aortic valve stenosis has gotten worse, she was put on lasix  and has to have sx in 6-8 weeks to replace the valve, she states her vision is stable  Referring physician: Donnie Galea, MD [address] [place],  [place] [address]  HISTORICAL INFORMATION:  Selected notes from the MEDICAL RECORD NUMBER Referred by Dr. Marvin Slot for eval of ret heme OD   CURRENT MEDICATIONS: No current outpatient medications on file. (Ophthalmic Drugs)   No current facility-administered medications for this visit. (Ophthalmic Drugs)   Current Outpatient Medications (Other)  Medication Sig   Accu-Chek Softclix Lancets lancets Use to check blood sugar daily. Dx E11.9   amiodarone  (PACERONE ) 200 MG tablet Take 1 tablet (200 mg total) by mouth in the morning and at bedtime. (Patient taking differently: Take 200 mg by mouth daily.)   apixaban  (ELIQUIS ) 5 MG TABS tablet Take 1 tablet (5 mg total) by mouth 2 (two) times daily.   Ascorbic Acid (VITAMIN C PO) Take 500 mg by mouth in the morning.   atorvastatin  (LIPITOR) 40 MG tablet Take 1 tablet (40 mg total) by mouth daily at 6PM.   Blood Glucose Monitoring Suppl (ACCU-CHEK GUIDE) w/Device KIT Use to check blood sugar daily. Dx E11.9   furosemide   (LASIX ) 20 MG tablet Take 1 tablet (20 mg total) by mouth 2 (two) times daily.   glucose blood (ACCU-CHEK GUIDE) test strip USE TO CHECK BLOOD SUGAR DAILY   levothyroxine  (SYNTHROID ) 88 MCG tablet Take 1 tablet (88 mcg total) by mouth daily.   metoprolol  succinate (TOPROL -XL) 25 MG 24 hr tablet Take 1 tablet (25 mg total) by mouth daily. Take with or immediately following a meal.   Multiple Vitamins-Minerals (PRESERVISION AREDS 2 PO) Take 1 tablet by mouth 2 (two) times daily.    valACYclovir  (VALTREX ) 500 MG tablet Take 1 tablet (500 mg total) by mouth daily as needed (fever blister).   vitamin B-12 (CYANOCOBALAMIN ) 1000 MCG tablet Take 1 tablet (1,000 mcg total) by mouth daily.   VITAMIN D  PO Take 1,000 Units by mouth in the morning.   No current facility-administered medications for this visit. (Other)   REVIEW OF SYSTEMS: ROS   Positive for: Endocrine, Cardiovascular, Eyes Negative for: Constitutional, Gastrointestinal, Neurological, Skin, Genitourinary, Musculoskeletal, HENT, Respiratory, Psychiatric, Allergic/Imm, Heme/Lymph Last edited by Sylvan Evener, COA on 11/09/2023  8:43 AM.      ALLERGIES Allergies  Allergen Reactions   Codeine Itching    After 3 days   Dilaudid [Hydromorphone Hcl] Itching    After 3 days   PAST MEDICAL HISTORY Past Medical  History:  Diagnosis Date   Aortic stenosis    Arthritis    B12 deficiency    Diabetes mellitus    type 2   Heart murmur    aortic stenosis   Hyperlipidemia    Hypertension    Hypothyroidism    IBS (irritable bowel syndrome)    Macular degeneration    Macular degeneration of right eye    NSVD (normal spontaneous vaginal delivery)    X 4   Pain    INTRASCAPULAR PAIN   SAB (spontaneous abortion)    X 2   Thyroid  disease    HYPOTHYROIDISM   Past Surgical History:  Procedure Laterality Date   ABDOMINAL HYSTERECTOMY  1977   TAH  (FIBROIDS)   CATARACT EXTRACTION Bilateral    DILATION AND CURETTAGE OF UTERUS      DOPPLER ECHOCARDIOGRAPHY  2009   HAD AN ECHOCARDIOGRAM THAT SHOWED MODERATE MITRAL  ANNULAR CALCIFICATION AND A NORMAL EJECTION FRACTION OF 55%   RIGHT HEART CATH AND CORONARY ANGIOGRAPHY N/A 10/24/2023   Procedure: RIGHT HEART CATH AND CORONARY ANGIOGRAPHY;  Surgeon: [name], MD;  Location: [facility];  Service: Cardiovascular;  Laterality: N/A;   SHOULDER SURGERY     RIGHT SHOULDER SURGERY   THYROIDECTOMY     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   RIGHT   TOTAL KNEE ARTHROPLASTY Left 09/10/2018   Procedure: TOTAL KNEE ARTHROPLASTY;  Surgeon: Dayne Even, MD;  Location: [facility];  Service: Orthopedics;  Laterality: Left;   FAMILY HISTORY Family History  Problem Relation Age of Onset   Osteoporosis Mother    Heart disease Mother    Macular degeneration Mother    Heart disease Father    Cancer Father        BRAIN TUMOR   Cancer Brother        BRAIN TUMOR   Diabetes Maternal Aunt    Diabetes Maternal Uncle    Stroke Paternal Grandfather    Colon cancer Neg Hx    Breast cancer Neg Hx    Migraines Neg Hx    SOCIAL HISTORY Social History   Tobacco Use   Smoking status: Never   Smokeless tobacco: Never  Vaping Use   Vaping status: Never Used  Substance Use Topics   Alcohol use: No    Alcohol/week: 0.0 standard drinks of alcohol   Drug use: No       OPHTHALMIC EXAM: Base Eye Exam     Visual Acuity (Snellen - Linear)       Right Left   Dist [place] 20/40 -1 20/30         Tonometry (Tonopen, 8:29 AM)       Right Left   Pressure 18 18         Pupils       Dark Light Shape React APD   Right 3 2 Round Brisk None   Left 3 2 Round Brisk None         Visual Fields (Counting fingers)       Left Right    Full          Extraocular Movement       Right Left    Full, Ortho Full, Ortho         Neuro/Psych     Oriented x3: Yes   Mood/Affect: Normal         Dilation     Both eyes: 1.0% Mydriacyl, 2.5% Phenylephrine  @ 8:29 AM  Slit Lamp and Fundus Exam     Slit Lamp Exam       Right Left   Lids/Lashes Dermatochalasis - upper lid Dermatochalasis - upper lid   Conjunctiva/Sclera White and quiet White and quiet   Cornea 1+ Punctate epithelial erosions, fine endo pigment, trace tear film debris 1+ fine Punctate epithelial erosions, fine endo pigment, trace tear film debris   Anterior Chamber Deep and quiet Deep and quiet   Iris Round and dilated Round and dilated   Lens Toric PC IOL with marks at 1200 and 0600, 1+ Posterior capsular opacification nasal side approaching visual axis Toric PC IOL with marks at 1200 and 0600, 1+ Posterior capsular opacification   Anterior Vitreous Vitreous syneresis, trace pigment, Posterior vitreous detachment, vitreous condensations Vitreous syneresis, Posterior vitreous detachment, vitreous condensations         Fundus Exam       Right Left   Disc mild Pallor, Sharp rim, +cupping, PPA mild Pallor, Sharp rim, +cupping, PPA   C/D Ratio 0.7 0.7   Macula Blunted foveal reflex, central CNV with subretinal heme -- stably resolved -- now with pigment clumping, RPE mottling, clumping and atrophy, Drusen, stable improvement in shallow SRF overlying CNV, trace cystic changes temporal mac -- slightly improved Flat, Blunted foveal reflex, Drusen, RPE mottling, clumping and atrophy, focal IRH/SRH inferior to fovea -- stably improved, scattered patches of GA, no heme or fluid   Vessels attenuated, mild tortuosity attenuated, mild tortuosity   Periphery Attached, reticular degeneration, paving stone degeneration IT quad, No heme Attached, reticular degeneration, mild inferior paving stone degeneration, No heme           IMAGING AND PROCEDURES  Imaging and Procedures for 11/09/2023  OCT, Retina - OU - Both Eyes       Right Eye Quality was good. Central Foveal Thickness: 346. Progression has improved. Findings include no SRF, abnormal foveal contour, retinal drusen , subretinal  hyper-reflective material, intraretinal fluid, pigment epithelial detachment, outer retinal atrophy (persistent PED, persistent IRF / cystic changes -- slightly improved, stable improvement in SRF overlying PED, +ORA).   Left Eye Quality was good. Central Foveal Thickness: 221. Progression has been stable. Findings include normal foveal contour, no IRF, no SRF, retinal drusen , outer retinal atrophy (stable improvement in [facility] and edema inferior fovea and macula).   Notes *Images captured and stored on drive  Diagnosis / Impression:  Ex ARMD OU OD: persistent PED, persistent IRF / cystic changes -- slightly improved, stable improvement in SRF overlying PED, +ORA OS: stable improvement in [facility] and edema inferior fovea and macula  Clinical management:  See below  Abbreviations: NFP - Normal foveal profile. CME - cystoid macular edema. PED - pigment epithelial detachment. IRF - intraretinal fluid. SRF - subretinal fluid. EZ - ellipsoid zone. ERM - epiretinal membrane. ORA - outer retinal atrophy. ORT - outer retinal tubulation. SRHM - subretinal hyper-reflective material. IRHM - intraretinal hyper-reflective material      Intravitreal Injection, Pharmacologic Agent - OD - Right Eye       Time Out 11/09/2023. 9:34 AM. Confirmed correct patient, procedure, site, and patient consented.   Anesthesia Topical anesthesia was used. Anesthetic medications included Lidocaine  2%, Proparacaine 0.5%.   Procedure Preparation included 5% betadine to ocular surface, eyelid speculum. A (32g) needle was used.   Injection: 6 mg faricimab -svoa 6 MG/0.05ML (Patient supplied)   Route: Intravitreal, Site: Right Eye   NDC: 99371-696-78, Lot: [id number], Expiration date: 04/08/2025, Waste: 0 mL  Post-op Post injection exam found visual acuity of at least counting fingers. The patient tolerated the procedure well. There were no complications. The patient received written and verbal post procedure care  education. Post injection medications were not given.   Notes **SAMPLE MEDICATION ADMINISTERED**            ASSESSMENT/PLAN:   ICD-10-CM   1. Exudative age-related macular degeneration of right eye with active choroidal neovascularization (HCC)  H35.3211 OCT, Retina - OU - Both Eyes    Intravitreal Injection, Pharmacologic Agent - OD - Right Eye    faricimab -svoa (VABYSMO ) 6mg /0.43mL intravitreal injection    2. Exudative age-related macular degeneration of left eye with active choroidal neovascularization (HCC)  H35.3221     3. Essential hypertension  I10     4. Hypertensive retinopathy of both eyes  H35.033     5. Pseudophakia, both eyes  Z96.1     6. PCO (posterior capsule opacification), bilateral  H26.493      1. Exudative age related macular degeneration, OD - s/p IVA OD #1 (05.06.22), #2 (06.03.22), #3 (07.01.22), #4 (07.29.22), #5 (08.26.22), #6 (09.23.22), #7 (10.21.22), #8 (11.18.22), #9 (12.16.22), #10 (01.20.23) -- IVA resistance ===================================== - s/p IVE OD #1 (02.24.23) -- sample, #2 (03.24.23), #3 (04.21.23), #4 (05.19.23), #5 (06.16.23), #6 (07.21.23), #7 (09.01.23), #8 (10.27.23), #9 (12.15.23), #10 (02.02.24), #11 (03.29.24), #12 (06.06.24) #13 (07.26.24), #14 (09.13.24), #15 (10.31.24), #16 (12.20.24) -- IVE resistance ====================================== - s/p IVV OD #1 (02.07.25, sample), #2 (03.21.25, sample)  - OCT shows persistent PED, persistent IRF / cystic changes -- slightly improved, stable improvement in SRF overlying PED, +ORA at 6 weeks  - BCVA 20/40 OD -- stable - recommend IVV OD #3 (SAMPLE) today, 05.02.25 w/ f/u in 6 wks  - RBA of procedure discussed, questions answered - see procedure note - IVV informed consent obtained and signed, 02.07.25 (OD)  - f/u 6 wks -- DFE/OCT, possible injection  2. Exudative age related macular degeneration, left eye   - early conversion to exudative ARMD -- noted on 01.04.24 - s/p IVA  OS #1 (01.04.24), #2 (02.02.24), #3 (03.01.24), #4 (03.29.24), #5 (05.03.24), #6 (06.06.24), #7 (07.26.24), #8 (09.13.24), #9 (10.31.24), #10 (12.20.24), #11 (02.07.25)  - BCVA OS 20/30 -- stable - OCT shows stable improvement in [facility] and edema inferior fovea and macula at 6 wks - recommend holding IVA OS today -- pt in agreement  - f/u in 6 wks, DFE, OCT, possible injxns  3,4. Hypertensive retinopathy OU - discussed importance of tight BP control -  monitor  5. Pseudophakia OU  - s/p CE/IOL  - IOL in good position, doing well  - monitor   6. PCO OU (OD > OS)  - monitor   Ophthalmic Meds Ordered this visit:  Meds ordered this encounter  Medications   faricimab -svoa (VABYSMO ) 6mg /0.25mL intravitreal injection     Return in about 6 weeks (around 12/21/2023) for f/u exu ARMD OD, DFE, OCT, Possible Injxn.  There are no Patient Instructions on file for this visit.  This document serves as a record of services personally performed by Jeanice Millard, MD, PhD. It was created on their behalf by Eller Gut COT, an ophthalmic technician. The creation of this record is the provider's dictation and/or activities during the visit.    Electronically signed by: Eller Gut COT 04.30.25  9:55 PM  This document serves as a record of services personally performed by Jeanice Millard, MD, PhD. It was created on their behalf by  Morley Arabia. Bevin Bucks, OA an ophthalmic technician. The creation of this record is the provider's dictation and/or activities during the visit.    Electronically signed by: Morley Arabia. Bevin Bucks, OA 11/09/23 9:55 PM  Jeanice Millard, M.D., Ph.D. Diseases & Surgery of the Retina and Vitreous [facility] 11/09/2023   I have reviewed the above documentation for accuracy and completeness, and I agree with the above. Jeanice Millard, M.D., Ph.D. 11/09/23 9:56 PM   Abbreviations: M myopia (nearsighted); A astigmatism; H hyperopia (farsighted); P  presbyopia; Mrx spectacle prescription;  CTL contact lenses; OD right eye; OS left eye; OU both eyes  XT exotropia; ET esotropia; PEK punctate epithelial keratitis; PEE punctate epithelial erosions; DES dry eye syndrome; MGD meibomian gland dysfunction; ATs artificial tears; PFAT's preservative free artificial tears; NSC nuclear sclerotic cataract; PSC posterior subcapsular cataract; ERM epi-retinal membrane; PVD posterior vitreous detachment; RD retinal detachment; DM diabetes mellitus; DR diabetic retinopathy; NPDR non-proliferative diabetic retinopathy; PDR proliferative diabetic retinopathy; CSME clinically significant macular edema; DME diabetic macular edema; dbh dot blot hemorrhages; CWS cotton wool spot; POAG primary open angle glaucoma; C/D cup-to-disc ratio; HVF humphrey visual field; GVF goldmann visual field; OCT optical coherence tomography; IOP intraocular pressure; BRVO Branch retinal vein occlusion; CRVO central retinal vein occlusion; CRAO central retinal artery occlusion; BRAO branch retinal artery occlusion; RT retinal tear; SB scleral buckle; PPV pars plana vitrectomy; VH Vitreous hemorrhage; PRP panretinal laser photocoagulation; IVK intravitreal kenalog; VMT vitreomacular traction; MH Macular hole;  NVD neovascularization of the disc; NVE neovascularization elsewhere; AREDS age related eye disease study; ARMD age related macular degeneration; POAG primary open angle glaucoma; EBMD epithelial/anterior basement membrane dystrophy; ACIOL anterior chamber intraocular lens; IOL intraocular lens; PCIOL posterior chamber intraocular lens; Phaco/IOL phacoemulsification with intraocular lens placement; PRK photorefractive keratectomy; LASIK laser assisted in situ keratomileusis; HTN hypertension; DM diabetes mellitus; COPD chronic obstructive pulmonary disease

## 2023-11-08 ENCOUNTER — Encounter: Payer: Self-pay | Admitting: Internal Medicine

## 2023-11-08 ENCOUNTER — Other Ambulatory Visit (HOSPITAL_COMMUNITY): Payer: Self-pay

## 2023-11-08 ENCOUNTER — Ambulatory Visit: Attending: Internal Medicine | Admitting: Internal Medicine

## 2023-11-08 ENCOUNTER — Other Ambulatory Visit (HOSPITAL_BASED_OUTPATIENT_CLINIC_OR_DEPARTMENT_OTHER): Payer: Self-pay

## 2023-11-08 VITALS — BP 132/72 | HR 71 | Ht 62.0 in | Wt 173.2 lb

## 2023-11-08 DIAGNOSIS — I059 Rheumatic mitral valve disease, unspecified: Secondary | ICD-10-CM | POA: Diagnosis not present

## 2023-11-08 DIAGNOSIS — I10 Essential (primary) hypertension: Secondary | ICD-10-CM | POA: Diagnosis not present

## 2023-11-08 DIAGNOSIS — I350 Nonrheumatic aortic (valve) stenosis: Secondary | ICD-10-CM | POA: Diagnosis not present

## 2023-11-08 DIAGNOSIS — I480 Paroxysmal atrial fibrillation: Secondary | ICD-10-CM

## 2023-11-08 DIAGNOSIS — N182 Chronic kidney disease, stage 2 (mild): Secondary | ICD-10-CM | POA: Diagnosis not present

## 2023-11-08 DIAGNOSIS — D6869 Other thrombophilia: Secondary | ICD-10-CM | POA: Diagnosis not present

## 2023-11-08 MED ORDER — FUROSEMIDE 20 MG PO TABS
20.0000 mg | ORAL_TABLET | Freq: Two times a day (BID) | ORAL | 6 refills | Status: DC
Start: 2023-11-08 — End: 2024-01-17
  Filled 2023-11-08 – 2023-12-07 (×3): qty 60, 30d supply, fill #0
  Filled 2024-01-08: qty 60, 30d supply, fill #1

## 2023-11-08 NOTE — Progress Notes (Addendum)
 Pre Surgical Assessment: 5 M Walk Test  75M=16.37ft  5 Meter Walk Test- trial 1: 12.60 seconds 5 Meter Walk Test- trial 2: 10.83 seconds 5 Meter Walk Test- trial 3: 10.52 seconds 5 Meter Walk Test Average: 11.32 seconds   ___________________________   Procedure Type: Isolated AVR Perioperative Outcome Estimate % Operative Mortality 7.59% Morbidity & Mortality 12.9% Stroke 1.08% Renal Failure 4.14% Reoperation 2.75% Prolonged Ventilation 9.17% Deep Sternal Wound Infection 0.101% Long Hospital Stay (>14 days) 9.42% Short Hospital Stay (<6 days)* 21.3%

## 2023-11-08 NOTE — Patient Instructions (Addendum)
 Medication Instructions:  Your physician has recommended you make the following change in your medication:   1) START furosemide  (Lasix ) 20 mg twice daily  *If you need a refill on your cardiac medications before your next appointment, please call your pharmacy*  Lab Work: In 1 week: BMP If you have labs (blood work) drawn today and your tests are completely normal, you will receive your results only by: MyChart Message (if you have MyChart) OR A paper copy in the mail If you have any lab test that is abnormal or we need to change your treatment, we will call you to review the results.  Testing/Procedures: Your physician has requested that you have cardiac CT. Cardiac computed tomography (CT) is a painless test that uses an x-ray machine to take clear, detailed pictures of your heart. For further information please visit [email or web address] Please follow instruction sheet as given.  Follow-Up: At [facility], you and your health needs are our priority.  As part of our continuing mission to provide you with exceptional heart care, our providers are all part of one team.  This team includes your primary Cardiologist (physician) and Advanced Practice Providers or APPs (Physician Assistants and Nurse Practitioners) who all work together to provide you with the care you need, when you need it.  Your next appointment:   Will be arranged by structural heart team.  Provider:   Alyssa Backbone, MD or Jeronimo Moors, PA-C  We recommend signing up for the patient portal called "MyChart".  Sign up information is provided on this After Visit Summary.  MyChart is used to connect with patients for Virtual Visits (Telemedicine).  Patients are able to view lab/test results, encounter notes, upcoming appointments, etc.  Non-urgent messages can be sent to your provider as well.   To learn more about what you can do with MyChart, go to [email or web address].

## 2023-11-09 ENCOUNTER — Ambulatory Visit (INDEPENDENT_AMBULATORY_CARE_PROVIDER_SITE_OTHER): Admitting: Ophthalmology

## 2023-11-09 ENCOUNTER — Encounter (INDEPENDENT_AMBULATORY_CARE_PROVIDER_SITE_OTHER): Payer: Self-pay | Admitting: Ophthalmology

## 2023-11-09 DIAGNOSIS — H26493 Other secondary cataract, bilateral: Secondary | ICD-10-CM | POA: Diagnosis not present

## 2023-11-09 DIAGNOSIS — H353221 Exudative age-related macular degeneration, left eye, with active choroidal neovascularization: Secondary | ICD-10-CM

## 2023-11-09 DIAGNOSIS — I10 Essential (primary) hypertension: Secondary | ICD-10-CM | POA: Diagnosis not present

## 2023-11-09 DIAGNOSIS — H353231 Exudative age-related macular degeneration, bilateral, with active choroidal neovascularization: Secondary | ICD-10-CM

## 2023-11-09 DIAGNOSIS — Z961 Presence of intraocular lens: Secondary | ICD-10-CM | POA: Diagnosis not present

## 2023-11-09 DIAGNOSIS — H353211 Exudative age-related macular degeneration, right eye, with active choroidal neovascularization: Secondary | ICD-10-CM

## 2023-11-09 DIAGNOSIS — H35033 Hypertensive retinopathy, bilateral: Secondary | ICD-10-CM | POA: Diagnosis not present

## 2023-11-09 MED ORDER — FARICIMAB-SVOA 6 MG/0.05ML IZ SOLN
6.0000 mg | INTRAVITREAL | Status: AC | PRN
  Administered 2023-11-09: 6 mg via INTRAVITREAL

## 2023-11-12 ENCOUNTER — Other Ambulatory Visit (HOSPITAL_COMMUNITY): Payer: Self-pay

## 2023-11-13 ENCOUNTER — Encounter: Payer: Self-pay | Admitting: Cardiology

## 2023-11-13 ENCOUNTER — Other Ambulatory Visit: Payer: Self-pay

## 2023-11-13 ENCOUNTER — Other Ambulatory Visit: Payer: Self-pay | Admitting: *Deleted

## 2023-11-13 ENCOUNTER — Other Ambulatory Visit (HOSPITAL_COMMUNITY): Payer: Self-pay

## 2023-11-13 DIAGNOSIS — I480 Paroxysmal atrial fibrillation: Secondary | ICD-10-CM

## 2023-11-13 MED ORDER — AMIODARONE HCL 200 MG PO TABS
200.0000 mg | ORAL_TABLET | Freq: Every day | ORAL | 2 refills | Status: DC
Start: 2023-11-13 — End: 2023-11-22
  Filled 2023-11-13: qty 30, 30d supply, fill #0

## 2023-11-15 DIAGNOSIS — I480 Paroxysmal atrial fibrillation: Secondary | ICD-10-CM | POA: Diagnosis not present

## 2023-11-15 DIAGNOSIS — I10 Essential (primary) hypertension: Secondary | ICD-10-CM | POA: Diagnosis not present

## 2023-11-15 DIAGNOSIS — N182 Chronic kidney disease, stage 2 (mild): Secondary | ICD-10-CM | POA: Diagnosis not present

## 2023-11-15 DIAGNOSIS — D6869 Other thrombophilia: Secondary | ICD-10-CM | POA: Diagnosis not present

## 2023-11-15 DIAGNOSIS — I059 Rheumatic mitral valve disease, unspecified: Secondary | ICD-10-CM | POA: Diagnosis not present

## 2023-11-15 DIAGNOSIS — I350 Nonrheumatic aortic (valve) stenosis: Secondary | ICD-10-CM | POA: Diagnosis not present

## 2023-11-16 ENCOUNTER — Encounter: Payer: Self-pay | Admitting: Internal Medicine

## 2023-11-16 ENCOUNTER — Other Ambulatory Visit: Payer: Self-pay

## 2023-11-16 ENCOUNTER — Telehealth: Payer: Self-pay

## 2023-11-16 ENCOUNTER — Other Ambulatory Visit (HOSPITAL_COMMUNITY): Payer: Self-pay

## 2023-11-16 ENCOUNTER — Other Ambulatory Visit: Payer: Self-pay | Admitting: Family Medicine

## 2023-11-16 DIAGNOSIS — I350 Nonrheumatic aortic (valve) stenosis: Secondary | ICD-10-CM

## 2023-11-16 DIAGNOSIS — N182 Chronic kidney disease, stage 2 (mild): Secondary | ICD-10-CM

## 2023-11-16 LAB — BASIC METABOLIC PANEL WITH GFR
BUN/Creatinine Ratio: 19 (ref 12–28)
BUN: 22 mg/dL (ref 8–27)
CO2: 26 mmol/L (ref 20–29)
Calcium: 9 mg/dL (ref 8.7–10.3)
Chloride: 98 mmol/L (ref 96–106)
Creatinine, Ser: 1.18 mg/dL — ABNORMAL HIGH (ref 0.57–1.00)
Glucose: 204 mg/dL — ABNORMAL HIGH (ref 70–99)
Potassium: 3.6 mmol/L (ref 3.5–5.2)
Sodium: 143 mmol/L (ref 134–144)
eGFR: 46 mL/min/{1.73_m2} — ABNORMAL LOW (ref >59)

## 2023-11-16 NOTE — Telephone Encounter (Signed)
 Reviewed 5/8 BMP results and Dr Lorie Rook requested that a BMP be rechecked next week. I left the patient a voicemail that I will place order and advised that she can have this drawn when she comes in for TAVR CT scans on 5/15.  I also instructed the pt to hold Furosemide  the day before and morning of CT scan.  Will send message also through my chart.

## 2023-11-19 ENCOUNTER — Telehealth: Payer: Self-pay

## 2023-11-19 ENCOUNTER — Other Ambulatory Visit (HOSPITAL_COMMUNITY): Payer: Self-pay

## 2023-11-19 MED ORDER — ACCU-CHEK SOFTCLIX LANCETS MISC
12 refills | Status: DC
  Filled 2023-11-19 (×2): qty 100, 100d supply, fill #0

## 2023-11-19 MED ORDER — GLUCOSE BLOOD VI STRP
ORAL_STRIP | 12 refills | Status: DC
  Filled 2023-11-19 (×2): qty 100, 100d supply, fill #0

## 2023-11-19 NOTE — Telephone Encounter (Signed)
 Pharmacy Patient Advocate Encounter   Received notification from CoverMyMeds that prior authorization for Accu-Chek Aviva Plus strips is required/requested.   Insurance verification completed.   The patient is insured through [facility] ADVANTAGE/RX ADVANCE .   Per test claim: PA required; PA submitted to above mentioned insurance via CoverMyMeds Key/confirmation #/EOC [id number] Status is pending

## 2023-11-19 NOTE — Telephone Encounter (Signed)
 ERROR

## 2023-11-20 ENCOUNTER — Other Ambulatory Visit: Payer: Self-pay

## 2023-11-20 ENCOUNTER — Other Ambulatory Visit (HOSPITAL_COMMUNITY): Payer: Self-pay

## 2023-11-20 ENCOUNTER — Other Ambulatory Visit: Payer: Self-pay | Admitting: Family Medicine

## 2023-11-20 ENCOUNTER — Telehealth: Payer: Self-pay | Admitting: Pharmacy Technician

## 2023-11-20 NOTE — Telephone Encounter (Signed)
 Pharmacy Patient Advocate Encounter   Received notification from Onbase that prior authorization for ACCU -CHEK GUIDE TEST STRIPS is required/requested.   Insurance verification completed.   The patient is insured through [facility] ADVANTAGE/RX ADVANCE .   Per test claim:  ONE TOUCH TEST STRIP is preferred by the insurance.  If suggested medication is appropriate, Please send in a new RX and discontinue this one. If not, please advise as to why it's not appropriate so that we may request a Prior Authorization. Please note, some preferred medications may still require a PA.  If the suggested medications have not been trialed and there are no contraindications to their use, the PA will not be submitted, as it will not be approved.

## 2023-11-20 NOTE — Telephone Encounter (Signed)
 Copied from CRM [id number]. Topic: Clinical - Medication Refill >> Nov 20, 2023 11:09 AM Earnestine Goes B wrote: Medication: Blood Glucose Monitoring Suppl (ACCU-CHEK GUIDE) w/Device KIT  Has the patient contacted their pharmacy? Yes (Agent: If no, request that the patient contact the pharmacy for the refill. If patient does not wish to contact the pharmacy document the reason why and proceed with request.) (Agent: If yes, when and what did the pharmacy advise?)  This is the patient's preferred pharmacy:  [facility] [address] [place] [address] Phone: [phone] Fax: [phone]  Is this the correct pharmacy for this prescription? Yes If no, delete pharmacy and type the correct one.   Has the prescription been filled recently? Yes  Is the patient out of the medication? Yes  Has the patient been seen for an appointment in the last year OR does the patient have an upcoming appointment? Yes  Can we respond through MyChart? Yes  Agent: Please be advised that Rx refills may take up to 3 business days. We ask that you follow-up with your pharmacy.

## 2023-11-20 NOTE — Telephone Encounter (Signed)
 Pharmacy Patient Advocate Encounter  Received notification from [employer]/RX ADVANCE that Prior Authorization for Accu-Chek Aviva Plus strips  has been DENIED under Part D but APPROVED under Part B.  Full denial letter will be uploaded to the media tab. See denial reason below.   PA #/Case ID/Reference #: [id number]

## 2023-11-20 NOTE — Telephone Encounter (Signed)
Please submit prior authorization.

## 2023-11-21 ENCOUNTER — Other Ambulatory Visit: Payer: Self-pay

## 2023-11-21 ENCOUNTER — Encounter: Payer: Self-pay | Admitting: Cardiology

## 2023-11-21 ENCOUNTER — Other Ambulatory Visit (HOSPITAL_COMMUNITY): Payer: Self-pay

## 2023-11-21 DIAGNOSIS — I350 Nonrheumatic aortic (valve) stenosis: Secondary | ICD-10-CM

## 2023-11-21 DIAGNOSIS — I480 Paroxysmal atrial fibrillation: Secondary | ICD-10-CM

## 2023-11-21 DIAGNOSIS — I080 Rheumatic disorders of both mitral and aortic valves: Secondary | ICD-10-CM

## 2023-11-21 DIAGNOSIS — E119 Type 2 diabetes mellitus without complications: Secondary | ICD-10-CM

## 2023-11-21 MED ORDER — ONETOUCH DELICA LANCETS 33G MISC
1.0000 | Freq: Three times a day (TID) | 3 refills | Status: AC
Start: 2023-11-21 — End: ?
  Filled 2023-11-21: qty 300, 100d supply, fill #0

## 2023-11-21 MED ORDER — ONETOUCH VERIO FLEX SYSTEM W/DEVICE KIT
1.0000 | PACK | Freq: Three times a day (TID) | 0 refills | Status: DC
  Filled 2023-11-21: qty 1, 30d supply, fill #0

## 2023-11-21 MED ORDER — ONETOUCH VERIO VI STRP
1.0000 | ORAL_STRIP | Freq: Three times a day (TID) | 3 refills | Status: AC
  Filled 2023-11-21: qty 300, 100d supply, fill #0
  Filled 2024-07-06: qty 300, 100d supply, fill #1

## 2023-11-21 MED ORDER — LANCET DEVICE MISC
1.0000 | Freq: Three times a day (TID) | 0 refills | Status: DC
  Filled 2023-11-21: qty 1, 30d supply, fill #0

## 2023-11-22 ENCOUNTER — Ambulatory Visit

## 2023-11-22 ENCOUNTER — Other Ambulatory Visit: Payer: Self-pay | Admitting: Physician Assistant

## 2023-11-22 ENCOUNTER — Ambulatory Visit (HOSPITAL_COMMUNITY)
Admission: RE | Admit: 2023-11-22 | Discharge: 2023-11-22 | Disposition: A | Discharge status: Home / Self Care | Source: Ambulatory Visit | Attending: Cardiovascular Disease | Admitting: Cardiovascular Disease

## 2023-11-22 DIAGNOSIS — I350 Nonrheumatic aortic (valve) stenosis: Secondary | ICD-10-CM | POA: Insufficient documentation

## 2023-11-22 DIAGNOSIS — I480 Paroxysmal atrial fibrillation: Secondary | ICD-10-CM

## 2023-11-22 DIAGNOSIS — N182 Chronic kidney disease, stage 2 (mild): Secondary | ICD-10-CM | POA: Diagnosis not present

## 2023-11-22 DIAGNOSIS — I517 Cardiomegaly: Secondary | ICD-10-CM | POA: Diagnosis not present

## 2023-11-22 DIAGNOSIS — I080 Rheumatic disorders of both mitral and aortic valves: Secondary | ICD-10-CM

## 2023-11-22 DIAGNOSIS — N281 Cyst of kidney, acquired: Secondary | ICD-10-CM | POA: Diagnosis not present

## 2023-11-22 DIAGNOSIS — R001 Bradycardia, unspecified: Secondary | ICD-10-CM

## 2023-11-22 DIAGNOSIS — J90 Pleural effusion, not elsewhere classified: Secondary | ICD-10-CM | POA: Diagnosis not present

## 2023-11-22 DIAGNOSIS — Z01818 Encounter for other preprocedural examination: Secondary | ICD-10-CM | POA: Diagnosis not present

## 2023-11-22 DIAGNOSIS — Z48812 Encounter for surgical aftercare following surgery on the circulatory system: Secondary | ICD-10-CM | POA: Diagnosis not present

## 2023-11-22 LAB — BASIC METABOLIC PANEL WITH GFR
BUN/Creatinine Ratio: 16 (ref 12–28)
BUN: 19 mg/dL (ref 8–27)
CO2: 23 mmol/L (ref 20–29)
Calcium: 9 mg/dL (ref 8.7–10.3)
Chloride: 102 mmol/L (ref 96–106)
Creatinine, Ser: 1.17 mg/dL — ABNORMAL HIGH (ref 0.57–1.00)
Glucose: 148 mg/dL — ABNORMAL HIGH (ref 70–99)
Potassium: 4.2 mmol/L (ref 3.5–5.2)
Sodium: 142 mmol/L (ref 134–144)
eGFR: 46 mL/min/{1.73_m2} — ABNORMAL LOW (ref >59)

## 2023-11-22 MED ORDER — AMIODARONE HCL 200 MG PO TABS: 100.0000 mg | ORAL_TABLET | Freq: Every day | ORAL | Status: DC

## 2023-11-22 MED ORDER — IOHEXOL 350 MG/ML SOLN
100.0000 mL | Freq: Once | INTRAVENOUS | Status: AC | PRN
  Administered 2023-11-22: 100 mL via INTRAVENOUS

## 2023-11-22 MED ORDER — METOPROLOL SUCCINATE ER 25 MG PO TB24
12.5000 mg | ORAL_TABLET | Freq: Every day | ORAL | Status: DC
Start: 2023-11-22 — End: 2024-01-02

## 2023-11-22 NOTE — Telephone Encounter (Signed)
 Reviewed with Dr Berry Bristol and he would like patient to decrease amiodarone  to 100 mg by mouth daily and decrease Metoprolol  succinate to 12.5 mg by mouth daily

## 2023-11-22 NOTE — Telephone Encounter (Signed)
 Reviewed with K. Hildy Lowers, [place] and patient should not take any extra metoprolol  prior to scan today.  Heart rate will be checked during scan.  Message to be sent to Dr Berry Bristol to see if any other changes need to be made based on heart rate

## 2023-11-22 NOTE — Progress Notes (Unsigned)
Enrolled for [employer] to mail a ZIO XT long term holter monitor to the patients address on file.   Dr. Jacinto Halim to read.

## 2023-11-22 NOTE — Telephone Encounter (Signed)
 Received message from K. Hildy Lowers, [place] and patient should have 3 day non live zio XT sent to her.

## 2023-11-23 ENCOUNTER — Encounter: Payer: Self-pay | Admitting: Physician Assistant

## 2023-11-23 ENCOUNTER — Ambulatory Visit: Payer: Self-pay | Admitting: Internal Medicine

## 2023-12-06 NOTE — H&P (View-Only) (Signed)
 [address]       [place] [address]             [phone]        [facility] Medical Record #[id number] Date of Birth: 07-30-1940  Referring: [name], MD Primary Care: Donnie Galea, MD Primary Cardiologist:None  Chief Complaint:   No chief complaint on file.   History of Present Illness:     Novali B Amaker is a 83 y.o. female presents for surgical evaluation of ***  The patient is an 83 year old female with the above listed medical problems referred for recommendations regarding the patient's aortic and valvular disease by Dr. Berry Bristol.  The patient saw Dr. Berry Bristol recently.  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  She also had mild to moderate mitral regurgitation with associated severe MAC.  She underwent cardiac catheterization which demonstrated no significant obstructive disease.  Of note her filling pressures were elevated with a mean RA of 14 mmHg mean PA of 44 mmHg, mean wedge pressure of 33 mmHg with V waves to 54 mmHg and a PVR consistent with pulmonary venous hypertension.   She has experienced worsening shortness of breath over the past week and a half, following a heart catheterization procedure. Prior to the procedure, she did not have shortness of breath. The symptom is severe enough to make walking from one room to another difficult, and she now sleeps in a chair due to discomfort when lying flat.   She has a history of atrial fibrillation and is currently on a blood thinner, a cholesterol medication, a thyroid  medication, and metoprolol . Recently, she was started on amiodarone  for atrial fibrillation. She was previously on metformin , which was discontinued by another doctor.   An ultrasound of the heart showed a worn-out aortic valve and a slightly leaky mitral valve. A heart catheterization revealed high fluid levels in the heart and lungs.   No chest pain, lightheadedness, blackouts, or  leg swelling. She mentions a sensation in her throat when lying on her right side, described as a 'squeak' or 'little noises,' and sometimes coughs to relieve it, though she does not produce sputum.   Her main difficulty is performing physical activities without stopping due to shortness of breath, impacting her ability to perform household tasks without taking breaks.  She is usually quite active.  This decline in her functional capacity is relatively new.     Past Medical History:  Diagnosis Date   Arthritis    B12 deficiency    Diabetes mellitus    type 2   Hyperlipidemia    Hypertension    Hypothyroidism    IBS (irritable bowel syndrome)    Macular degeneration    Macular degeneration of right eye    Severe aortic stenosis    Thyroid  disease    HYPOTHYROIDISM    Past Surgical History:  Procedure Laterality Date   ABDOMINAL HYSTERECTOMY  1977   TAH  (FIBROIDS)   CATARACT EXTRACTION Bilateral    DILATION AND CURETTAGE OF UTERUS     DOPPLER ECHOCARDIOGRAPHY  2009   HAD AN ECHOCARDIOGRAM THAT SHOWED MODERATE MITRAL  ANNULAR CALCIFICATION AND A NORMAL EJECTION FRACTION OF 55%   RIGHT HEART CATH AND CORONARY ANGIOGRAPHY N/A 10/24/2023   Procedure: RIGHT HEART CATH AND CORONARY ANGIOGRAPHY;  Surgeon: [name], MD;  Location: [facility];  Service: Cardiovascular;  Laterality: N/A;   SHOULDER SURGERY  RIGHT SHOULDER SURGERY   THYROIDECTOMY     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   RIGHT   TOTAL KNEE ARTHROPLASTY Left 09/10/2018   Procedure: TOTAL KNEE ARTHROPLASTY;  Surgeon: Dayne Even, MD;  Location: [facility];  Service: Orthopedics;  Laterality: Left;    Social History:  Social History   Tobacco Use  Smoking Status Never  Smokeless Tobacco Never    Social History   Substance and Sexual Activity  Alcohol Use No   Alcohol/week: 0.0 standard drinks of alcohol     Allergies  Allergen Reactions   Codeine Itching    After 3 days   Dilaudid  [Hydromorphone Hcl] Itching    After 3 days      Current Outpatient Medications  Medication Sig Dispense Refill   Accu-Chek Softclix Lancets lancets Use to check blood sugar daily. Dx E11.9 100 each 12   amiodarone  (PACERONE ) 200 MG tablet Take 0.5 tablets (100 mg total) by mouth daily.     apixaban  (ELIQUIS ) 5 MG TABS tablet Take 1 tablet (5 mg total) by mouth 2 (two) times daily. 180 tablet 1   Ascorbic Acid (VITAMIN C PO) Take 500 mg by mouth in the morning.     atorvastatin  (LIPITOR) 40 MG tablet Take 1 tablet (40 mg total) by mouth daily at 6PM. 90 tablet 3   Blood Glucose Monitoring Suppl (ACCU-CHEK GUIDE) w/Device KIT Use to check blood sugar daily. Dx E11.9 1 kit 0   Blood Glucose Monitoring Suppl (ONETOUCH VERIO FLEX SYSTEM) w/Device KIT use to test blood sugar in the morning, noon and at bedtime 1 kit 0   furosemide  (LASIX ) 20 MG tablet Take 1 tablet (20 mg total) by mouth 2 (two) times daily. 60 tablet 6   glucose blood (ONETOUCH VERIO) test strip use to test blood glucose levels in the morning, at noon, and at bedtime. 300 each 3   Lancet Device MISC 1 each by Does not apply route in the morning, at noon, and at bedtime. May substitute to any manufacturer covered by patient's insurance. 1 each 0   OneTouch Delica Lancets 33G MISC use one in the morning, at noon, and at bedtime to check blood glucose levels 300 each 3   levothyroxine  (SYNTHROID ) 88 MCG tablet Take 1 tablet (88 mcg total) by mouth daily. 90 tablet 3   metoprolol  succinate (TOPROL -XL) 25 MG 24 hr tablet Take 0.5 tablets (12.5 mg total) by mouth daily. Take with or immediately following a meal.     Multiple Vitamins-Minerals (PRESERVISION AREDS 2 PO) Take 1 tablet by mouth 2 (two) times daily.      valACYclovir  (VALTREX ) 500 MG tablet Take 1 tablet (500 mg total) by mouth daily as needed (fever blister). 30 tablet 2   vitamin B-12 (CYANOCOBALAMIN ) 1000 MCG tablet Take 1 tablet (1,000 mcg total) by mouth daily.      VITAMIN D  PO Take 1,000 Units by mouth in the morning.     No current facility-administered medications for this visit.    (Not in a hospital admission)   Family History  Problem Relation Age of Onset   Osteoporosis Mother    Heart disease Mother    Macular degeneration Mother    Heart disease Father    Cancer Father        BRAIN TUMOR   Cancer Brother        BRAIN TUMOR   Diabetes Maternal Aunt    Diabetes Maternal Uncle    Stroke Paternal Grandfather  Colon cancer Neg Hx    Breast cancer Neg Hx    Migraines Neg Hx      Review of Systems:   ROS    Physical Exam: There were no vitals taken for this visit. Physical Exam    Diagnostic Studies & Laboratory data:    Left Heart Catherization:  Intervention Echo: IMPRESSIONS     1. Left ventricular ejection fraction, by estimation, is 60 to 65%. The  left ventricle has normal function. The left ventricle has no regional  wall motion abnormalities. There is mild left ventricular hypertrophy of  the basal-septal segment. Left  ventricular diastolic parameters are indeterminate. Elevated left  ventricular end-diastolic pressure. The average left ventricular global  longitudinal strain is -16.0 %. The global longitudinal strain is  abnormal.   2. Right ventricular systolic function is normal. The right ventricular  size is normal. There is moderately elevated pulmonary artery systolic  pressure. The estimated right ventricular systolic pressure is 51.4 mmHg.   3. The mitral valve is degenerative. Mild to moderate mitral valve  regurgitation. Mild mitral stenosis. The mean mitral valve gradient is 4.5  mmHg. Severe mitral annular calcification.   4. The aortic valve is calcified. There is severe calcifcation of the  aortic valve. There is severe thickening of the aortic valve. Aortic valve  regurgitation is trivial. Severe aortic valve stenosis. Aortic valve area,  by VTI measures 0.41 cm. Aortic  valve mean  gradient measures 48.0 mmHg. Aortic valve Vmax measures 4.41  m/s.   5. The inferior vena cava is normal in size with greater than 50%  respiratory variability, suggesting right atrial pressure of 3 mmHg.   6. Compared to study dated 09/29/21, the mean AVG has increased from 25 to  , VMax has increased from 3.42 to 4.76m/s and DVI has decreased from  0.3 to 0.18.    EKG: afib CT Scan: FINDINGS: Aortic Valve: Valve Morphology: Tricuspid, heavily calcified valve with reduced excursion the planimeter valve area is 0.983 Sq cm consistent with severe aortic stenosis   Annular and subannular calcification: Distal subannular calcification of the intervalvular fibrosa   Aortic Valve Calcium  Score: 1599   Perimembranous septal diameter: 6 mm   Mitral Valve: Average calcium  thickness 4 mm. Greater than 270 degrees of annular calcification with calcification of the right fibrous trigone. Minimal leaflet calcification. MAC Score of 7. Valve-in-MAC assessment not performed.   Aortic Annulus Measurements- 24% phase   Major annulus diameter: 23 mm   Minor annulus diameter: 20 mm   Annular perimeter: 67 mm   Annular area: 3.50 cm2   Aortic Measurements- 75% phase   Sinotubular Junction: 27 mm   Ascending Thoracic Aorta: 30 mm   Aortic Arch: 20 mm   Descending Thoracic Aorta: 22 mm   Aortic atherosclerosis.   Sinus of Valsalva Measurements:   Right coronary cusp width: 28 mm   Left coronary cusp width: 29 mm   Non coronary cusp width: 28 mm   Coronary Artery Height above Annulus:   Left Main: 18 mm   Left SoV height: 23 mm   Right Coronary: 18 mm   Right SoV height: 23 mm   Optimum Fluoroscopic Angle for Delivery: LAO 6, CAU 6   Cusp overlay view angle: RAO 0, CAU 11   Valves for structural team consideration:   Sapien: 23 mm Edwards Sapien Valve   Evolut: 26 mm Evolut Valve I have independently reviewed the above radiologic studies and discussed with  the  patient   Recent Lab Findings: Lab Results  Component Value Date   WBC 8.2 10/09/2023   HGB 10.5 (L) 10/24/2023   HCT 31.0 (L) 10/24/2023   PLT 539.0 (H) 10/09/2023   GLUCOSE 148 (H) 11/22/2023   CHOL 120 10/09/2023   TRIG 94.0 10/09/2023   HDL 33.70 (L) 10/09/2023   LDLCALC 67 10/09/2023   ALT 13 10/09/2023   AST 16 10/09/2023   NA 142 11/22/2023   K 4.2 11/22/2023   CL 102 11/22/2023   CREATININE 1.17 (H) 11/22/2023   BUN 19 11/22/2023   CO2 23 11/22/2023   TSH 1.15 10/09/2023   INR 1.3 (H) 09/28/2021   HGBA1C 6.4 10/09/2023      Assessment / Plan:   83 y.o. female with severe aortic stenosis.  STS score: 7.59.  NYHA Class ***.  The risks and benefits of transfemoral TAVR were discussed in detail.  We also discussed possibility of an emergent sternotomy to address any procedural complications.  Based on our discussion, we collectively decided that an emergent sternotomy would not be indicated given her age.  The patient is *** agreeable to proceed.  Based on my review of her LHC, echo, and CTA, I agree with the multidisciplinary plan to proceed with a transfemoral 23 S3 TAVR.      I  spent {CHL ONC TIME VISIT - ZOXWR:[id number]} counseling the patient face to face.   Hilarie Lovely 12/06/2023 8:12 PM

## 2023-12-06 NOTE — Progress Notes (Unsigned)
 [address]       [place] [address]             [phone]        [facility] Medical Record #[id number] Date of Birth: 07-30-1940  Referring: [name], MD Primary Care: Donnie Galea, MD Primary Cardiologist:None  Chief Complaint:   No chief complaint on file.   History of Present Illness:     Dana Sandoval is a 83 y.o. female presents for surgical evaluation of ***  The patient is an 83 year old female with the above listed medical problems referred for recommendations regarding the patient's aortic and valvular disease by Dr. Berry Bristol.  The patient saw Dr. Berry Bristol recently.  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  She also had mild to moderate mitral regurgitation with associated severe MAC.  She underwent cardiac catheterization which demonstrated no significant obstructive disease.  Of note her filling pressures were elevated with a mean RA of 14 mmHg mean PA of 44 mmHg, mean wedge pressure of 33 mmHg with V waves to 54 mmHg and a PVR consistent with pulmonary venous hypertension.   She has experienced worsening shortness of breath over the past week and a half, following a heart catheterization procedure. Prior to the procedure, she did not have shortness of breath. The symptom is severe enough to make walking from one room to another difficult, and she now sleeps in a chair due to discomfort when lying flat.   She has a history of atrial fibrillation and is currently on a blood thinner, a cholesterol medication, a thyroid  medication, and metoprolol . Recently, she was started on amiodarone  for atrial fibrillation. She was previously on metformin , which was discontinued by another doctor.   An ultrasound of the heart showed a worn-out aortic valve and a slightly leaky mitral valve. A heart catheterization revealed high fluid levels in the heart and lungs.   No chest pain, lightheadedness, blackouts, or  leg swelling. She mentions a sensation in her throat when lying on her right side, described as a 'squeak' or 'little noises,' and sometimes coughs to relieve it, though she does not produce sputum.   Her main difficulty is performing physical activities without stopping due to shortness of breath, impacting her ability to perform household tasks without taking breaks.  She is usually quite active.  This decline in her functional capacity is relatively new.     Past Medical History:  Diagnosis Date   Arthritis    B12 deficiency    Diabetes mellitus    type 2   Hyperlipidemia    Hypertension    Hypothyroidism    IBS (irritable bowel syndrome)    Macular degeneration    Macular degeneration of right eye    Severe aortic stenosis    Thyroid  disease    HYPOTHYROIDISM    Past Surgical History:  Procedure Laterality Date   ABDOMINAL HYSTERECTOMY  1977   TAH  (FIBROIDS)   CATARACT EXTRACTION Bilateral    DILATION AND CURETTAGE OF UTERUS     DOPPLER ECHOCARDIOGRAPHY  2009   HAD AN ECHOCARDIOGRAM THAT SHOWED MODERATE MITRAL  ANNULAR CALCIFICATION AND A NORMAL EJECTION FRACTION OF 55%   RIGHT HEART CATH AND CORONARY ANGIOGRAPHY N/A 10/24/2023   Procedure: RIGHT HEART CATH AND CORONARY ANGIOGRAPHY;  Surgeon: [name], MD;  Location: [facility];  Service: Cardiovascular;  Laterality: N/A;   SHOULDER SURGERY  RIGHT SHOULDER SURGERY   THYROIDECTOMY     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   RIGHT   TOTAL KNEE ARTHROPLASTY Left 09/10/2018   Procedure: TOTAL KNEE ARTHROPLASTY;  Surgeon: Dayne Even, MD;  Location: [facility];  Service: Orthopedics;  Laterality: Left;    Social History:  Social History   Tobacco Use  Smoking Status Never  Smokeless Tobacco Never    Social History   Substance and Sexual Activity  Alcohol Use No   Alcohol/week: 0.0 standard drinks of alcohol     Allergies  Allergen Reactions   Codeine Itching    After 3 days   Dilaudid  [Hydromorphone Hcl] Itching    After 3 days      Current Outpatient Medications  Medication Sig Dispense Refill   Accu-Chek Softclix Lancets lancets Use to check blood sugar daily. Dx E11.9 100 each 12   amiodarone  (PACERONE ) 200 MG tablet Take 0.5 tablets (100 mg total) by mouth daily.     apixaban  (ELIQUIS ) 5 MG TABS tablet Take 1 tablet (5 mg total) by mouth 2 (two) times daily. 180 tablet 1   Ascorbic Acid (VITAMIN C PO) Take 500 mg by mouth in the morning.     atorvastatin  (LIPITOR) 40 MG tablet Take 1 tablet (40 mg total) by mouth daily at 6PM. 90 tablet 3   Blood Glucose Monitoring Suppl (ACCU-CHEK GUIDE) w/Device KIT Use to check blood sugar daily. Dx E11.9 1 kit 0   Blood Glucose Monitoring Suppl (ONETOUCH VERIO FLEX SYSTEM) w/Device KIT use to test blood sugar in the morning, noon and at bedtime 1 kit 0   furosemide  (LASIX ) 20 MG tablet Take 1 tablet (20 mg total) by mouth 2 (two) times daily. 60 tablet 6   glucose blood (ONETOUCH VERIO) test strip use to test blood glucose levels in the morning, at noon, and at bedtime. 300 each 3   Lancet Device MISC 1 each by Does not apply route in the morning, at noon, and at bedtime. May substitute to any manufacturer covered by patient's insurance. 1 each 0   OneTouch Delica Lancets 33G MISC use one in the morning, at noon, and at bedtime to check blood glucose levels 300 each 3   levothyroxine  (SYNTHROID ) 88 MCG tablet Take 1 tablet (88 mcg total) by mouth daily. 90 tablet 3   metoprolol  succinate (TOPROL -XL) 25 MG 24 hr tablet Take 0.5 tablets (12.5 mg total) by mouth daily. Take with or immediately following a meal.     Multiple Vitamins-Minerals (PRESERVISION AREDS 2 PO) Take 1 tablet by mouth 2 (two) times daily.      valACYclovir  (VALTREX ) 500 MG tablet Take 1 tablet (500 mg total) by mouth daily as needed (fever blister). 30 tablet 2   vitamin B-12 (CYANOCOBALAMIN ) 1000 MCG tablet Take 1 tablet (1,000 mcg total) by mouth daily.      VITAMIN D  PO Take 1,000 Units by mouth in the morning.     No current facility-administered medications for this visit.    (Not in a hospital admission)   Family History  Problem Relation Age of Onset   Osteoporosis Mother    Heart disease Mother    Macular degeneration Mother    Heart disease Father    Cancer Father        BRAIN TUMOR   Cancer Brother        BRAIN TUMOR   Diabetes Maternal Aunt    Diabetes Maternal Uncle    Stroke Paternal Grandfather  Colon cancer Neg Hx    Breast cancer Neg Hx    Migraines Neg Hx      Review of Systems:   ROS    Physical Exam: There were no vitals taken for this visit. Physical Exam    Diagnostic Studies & Laboratory data:    Left Heart Catherization:  Intervention Echo: IMPRESSIONS     1. Left ventricular ejection fraction, by estimation, is 60 to 65%. The  left ventricle has normal function. The left ventricle has no regional  wall motion abnormalities. There is mild left ventricular hypertrophy of  the basal-septal segment. Left  ventricular diastolic parameters are indeterminate. Elevated left  ventricular end-diastolic pressure. The average left ventricular global  longitudinal strain is -16.0 %. The global longitudinal strain is  abnormal.   2. Right ventricular systolic function is normal. The right ventricular  size is normal. There is moderately elevated pulmonary artery systolic  pressure. The estimated right ventricular systolic pressure is 51.4 mmHg.   3. The mitral valve is degenerative. Mild to moderate mitral valve  regurgitation. Mild mitral stenosis. The mean mitral valve gradient is 4.5  mmHg. Severe mitral annular calcification.   4. The aortic valve is calcified. There is severe calcifcation of the  aortic valve. There is severe thickening of the aortic valve. Aortic valve  regurgitation is trivial. Severe aortic valve stenosis. Aortic valve area,  by VTI measures 0.41 cm. Aortic  valve mean  gradient measures 48.0 mmHg. Aortic valve Vmax measures 4.41  m/s.   5. The inferior vena cava is normal in size with greater than 50%  respiratory variability, suggesting right atrial pressure of 3 mmHg.   6. Compared to study dated 09/29/21, the mean AVG has increased from 25 to  , VMax has increased from 3.42 to 4.76m/s and DVI has decreased from  0.3 to 0.18.    EKG: afib CT Scan: FINDINGS: Aortic Valve: Valve Morphology: Tricuspid, heavily calcified valve with reduced excursion the planimeter valve area is 0.983 Sq cm consistent with severe aortic stenosis   Annular and subannular calcification: Distal subannular calcification of the intervalvular fibrosa   Aortic Valve Calcium  Score: 1599   Perimembranous septal diameter: 6 mm   Mitral Valve: Average calcium  thickness 4 mm. Greater than 270 degrees of annular calcification with calcification of the right fibrous trigone. Minimal leaflet calcification. MAC Score of 7. Valve-in-MAC assessment not performed.   Aortic Annulus Measurements- 24% phase   Major annulus diameter: 23 mm   Minor annulus diameter: 20 mm   Annular perimeter: 67 mm   Annular area: 3.50 cm2   Aortic Measurements- 75% phase   Sinotubular Junction: 27 mm   Ascending Thoracic Aorta: 30 mm   Aortic Arch: 20 mm   Descending Thoracic Aorta: 22 mm   Aortic atherosclerosis.   Sinus of Valsalva Measurements:   Right coronary cusp width: 28 mm   Left coronary cusp width: 29 mm   Non coronary cusp width: 28 mm   Coronary Artery Height above Annulus:   Left Main: 18 mm   Left SoV height: 23 mm   Right Coronary: 18 mm   Right SoV height: 23 mm   Optimum Fluoroscopic Angle for Delivery: LAO 6, CAU 6   Cusp overlay view angle: RAO 0, CAU 11   Valves for structural team consideration:   Sapien: 23 mm Edwards Sapien Valve   Evolut: 26 mm Evolut Valve I have independently reviewed the above radiologic studies and discussed with  the  patient   Recent Lab Findings: Lab Results  Component Value Date   WBC 8.2 10/09/2023   HGB 10.5 (L) 10/24/2023   HCT 31.0 (L) 10/24/2023   PLT 539.0 (H) 10/09/2023   GLUCOSE 148 (H) 11/22/2023   CHOL 120 10/09/2023   TRIG 94.0 10/09/2023   HDL 33.70 (L) 10/09/2023   LDLCALC 67 10/09/2023   ALT 13 10/09/2023   AST 16 10/09/2023   NA 142 11/22/2023   K 4.2 11/22/2023   CL 102 11/22/2023   CREATININE 1.17 (H) 11/22/2023   BUN 19 11/22/2023   CO2 23 11/22/2023   TSH 1.15 10/09/2023   INR 1.3 (H) 09/28/2021   HGBA1C 6.4 10/09/2023      Assessment / Plan:   83 y.o. female with severe aortic stenosis.  STS score: 7.59.  NYHA Class ***.  The risks and benefits of transfemoral TAVR were discussed in detail.  We also discussed possibility of an emergent sternotomy to address any procedural complications.  Based on our discussion, we collectively decided that an emergent sternotomy would not be indicated given her age.  The patient is *** agreeable to proceed.  Based on my review of her LHC, echo, and CTA, I agree with the multidisciplinary plan to proceed with a transfemoral 23 S3 TAVR.      I  spent {CHL ONC TIME VISIT - ZOXWR:[id number]} counseling the patient face to face.   Hilarie Lovely 12/06/2023 8:12 PM

## 2023-12-07 ENCOUNTER — Other Ambulatory Visit (HOSPITAL_COMMUNITY): Payer: Self-pay

## 2023-12-07 ENCOUNTER — Encounter: Payer: Self-pay | Admitting: Thoracic Surgery (Cardiothoracic Vascular Surgery)

## 2023-12-07 ENCOUNTER — Ambulatory Visit
Attending: Thoracic Surgery (Cardiothoracic Vascular Surgery) | Admitting: Thoracic Surgery (Cardiothoracic Vascular Surgery)

## 2023-12-07 ENCOUNTER — Encounter: Payer: Self-pay | Admitting: Family Medicine

## 2023-12-07 VITALS — BP 101/62 | HR 96 | Resp 20 | Ht 62.0 in | Wt 163.9 lb

## 2023-12-07 DIAGNOSIS — I350 Nonrheumatic aortic (valve) stenosis: Secondary | ICD-10-CM

## 2023-12-07 DIAGNOSIS — R001 Bradycardia, unspecified: Secondary | ICD-10-CM

## 2023-12-07 DIAGNOSIS — I080 Rheumatic disorders of both mitral and aortic valves: Secondary | ICD-10-CM

## 2023-12-07 DIAGNOSIS — I480 Paroxysmal atrial fibrillation: Secondary | ICD-10-CM | POA: Diagnosis not present

## 2023-12-09 ENCOUNTER — Telehealth: Payer: Self-pay | Admitting: Family Medicine

## 2023-12-09 NOTE — Telephone Encounter (Signed)
 See MyChart message below.  Please triage patient and please see about getting her checked related to anxiety/breathing.  Thanks.  ======================== This is [name], Glori's daughter, typing this on her behalf.   Her diarrhea had stopped after she stopped the Metformin .  But it has since started again over the last week or so which we are thinking is coming from her anxiety and nerves over the upcoming heart surgery.  She has gotten so short of breath that she can hardly stand and is basically confined to her chair and only getting up for the bathroom.   Are you able to prescribe any medicine to help with the anxiety and hopefully the diarrhea for a week or so until she can get through the surgery or do we need to bring her in to see someone?     Deandra is scheduled for TAVR surgery on June 10th.

## 2023-12-10 ENCOUNTER — Emergency Department (HOSPITAL_COMMUNITY)

## 2023-12-10 ENCOUNTER — Ambulatory Visit: Payer: Self-pay | Admitting: Physician Assistant

## 2023-12-10 ENCOUNTER — Inpatient Hospital Stay (HOSPITAL_COMMUNITY)
Admission: EM | Admit: 2023-12-10 | Discharge: 2023-12-14 | DRG: 377 | Disposition: A | Discharge status: Home Health | Attending: Internal Medicine | Admitting: Internal Medicine

## 2023-12-10 ENCOUNTER — Other Ambulatory Visit: Payer: Self-pay

## 2023-12-10 ENCOUNTER — Ambulatory Visit: Admitting: Family Medicine

## 2023-12-10 DIAGNOSIS — Z833 Family history of diabetes mellitus: Secondary | ICD-10-CM | POA: Diagnosis not present

## 2023-12-10 DIAGNOSIS — R0602 Shortness of breath: Secondary | ICD-10-CM | POA: Diagnosis not present

## 2023-12-10 DIAGNOSIS — Z79890 Hormone replacement therapy (postmenopausal): Secondary | ICD-10-CM

## 2023-12-10 DIAGNOSIS — Z8249 Family history of ischemic heart disease and other diseases of the circulatory system: Secondary | ICD-10-CM | POA: Diagnosis not present

## 2023-12-10 DIAGNOSIS — E890 Postprocedural hypothyroidism: Secondary | ICD-10-CM | POA: Diagnosis present

## 2023-12-10 DIAGNOSIS — D500 Iron deficiency anemia secondary to blood loss (chronic): Secondary | ICD-10-CM | POA: Diagnosis not present

## 2023-12-10 DIAGNOSIS — N281 Cyst of kidney, acquired: Secondary | ICD-10-CM | POA: Diagnosis not present

## 2023-12-10 DIAGNOSIS — R001 Bradycardia, unspecified: Secondary | ICD-10-CM | POA: Diagnosis not present

## 2023-12-10 DIAGNOSIS — K922 Gastrointestinal hemorrhage, unspecified: Principal | ICD-10-CM | POA: Insufficient documentation

## 2023-12-10 DIAGNOSIS — Z885 Allergy status to narcotic agent status: Secondary | ICD-10-CM | POA: Diagnosis not present

## 2023-12-10 DIAGNOSIS — R5383 Other fatigue: Secondary | ICD-10-CM | POA: Diagnosis not present

## 2023-12-10 DIAGNOSIS — I4891 Unspecified atrial fibrillation: Secondary | ICD-10-CM | POA: Diagnosis present

## 2023-12-10 DIAGNOSIS — Z823 Family history of stroke: Secondary | ICD-10-CM

## 2023-12-10 DIAGNOSIS — E119 Type 2 diabetes mellitus without complications: Secondary | ICD-10-CM | POA: Diagnosis present

## 2023-12-10 DIAGNOSIS — K648 Other hemorrhoids: Secondary | ICD-10-CM | POA: Diagnosis not present

## 2023-12-10 DIAGNOSIS — D509 Iron deficiency anemia, unspecified: Secondary | ICD-10-CM | POA: Diagnosis not present

## 2023-12-10 DIAGNOSIS — K862 Cyst of pancreas: Secondary | ICD-10-CM | POA: Diagnosis not present

## 2023-12-10 DIAGNOSIS — Z96652 Presence of left artificial knee joint: Secondary | ICD-10-CM | POA: Diagnosis present

## 2023-12-10 DIAGNOSIS — Z7901 Long term (current) use of anticoagulants: Secondary | ICD-10-CM | POA: Diagnosis not present

## 2023-12-10 DIAGNOSIS — E876 Hypokalemia: Secondary | ICD-10-CM | POA: Diagnosis not present

## 2023-12-10 DIAGNOSIS — I350 Nonrheumatic aortic (valve) stenosis: Secondary | ICD-10-CM | POA: Diagnosis present

## 2023-12-10 DIAGNOSIS — G2581 Restless legs syndrome: Secondary | ICD-10-CM | POA: Diagnosis not present

## 2023-12-10 DIAGNOSIS — N179 Acute kidney failure, unspecified: Secondary | ICD-10-CM | POA: Diagnosis not present

## 2023-12-10 DIAGNOSIS — Z90710 Acquired absence of both cervix and uterus: Secondary | ICD-10-CM | POA: Diagnosis not present

## 2023-12-10 DIAGNOSIS — Z8262 Family history of osteoporosis: Secondary | ICD-10-CM | POA: Diagnosis not present

## 2023-12-10 DIAGNOSIS — K921 Melena: Secondary | ICD-10-CM | POA: Diagnosis not present

## 2023-12-10 DIAGNOSIS — I10 Essential (primary) hypertension: Secondary | ICD-10-CM | POA: Diagnosis not present

## 2023-12-10 DIAGNOSIS — R0600 Dyspnea, unspecified: Secondary | ICD-10-CM | POA: Diagnosis not present

## 2023-12-10 DIAGNOSIS — I21A1 Myocardial infarction type 2: Secondary | ICD-10-CM | POA: Diagnosis not present

## 2023-12-10 DIAGNOSIS — I499 Cardiac arrhythmia, unspecified: Secondary | ICD-10-CM | POA: Diagnosis not present

## 2023-12-10 DIAGNOSIS — I959 Hypotension, unspecified: Secondary | ICD-10-CM | POA: Diagnosis not present

## 2023-12-10 DIAGNOSIS — K649 Unspecified hemorrhoids: Secondary | ICD-10-CM | POA: Diagnosis not present

## 2023-12-10 DIAGNOSIS — I480 Paroxysmal atrial fibrillation: Secondary | ICD-10-CM | POA: Diagnosis present

## 2023-12-10 DIAGNOSIS — R7989 Other specified abnormal findings of blood chemistry: Secondary | ICD-10-CM | POA: Diagnosis not present

## 2023-12-10 DIAGNOSIS — E785 Hyperlipidemia, unspecified: Secondary | ICD-10-CM | POA: Diagnosis present

## 2023-12-10 DIAGNOSIS — Z8673 Personal history of transient ischemic attack (TIA), and cerebral infarction without residual deficits: Secondary | ICD-10-CM

## 2023-12-10 DIAGNOSIS — K429 Umbilical hernia without obstruction or gangrene: Secondary | ICD-10-CM | POA: Diagnosis not present

## 2023-12-10 DIAGNOSIS — J90 Pleural effusion, not elsewhere classified: Secondary | ICD-10-CM | POA: Diagnosis present

## 2023-12-10 DIAGNOSIS — M549 Dorsalgia, unspecified: Secondary | ICD-10-CM | POA: Diagnosis not present

## 2023-12-10 DIAGNOSIS — R918 Other nonspecific abnormal finding of lung field: Secondary | ICD-10-CM | POA: Diagnosis not present

## 2023-12-10 DIAGNOSIS — D62 Acute posthemorrhagic anemia: Secondary | ICD-10-CM | POA: Diagnosis present

## 2023-12-10 DIAGNOSIS — N200 Calculus of kidney: Secondary | ICD-10-CM | POA: Diagnosis not present

## 2023-12-10 DIAGNOSIS — D649 Anemia, unspecified: Secondary | ICD-10-CM | POA: Diagnosis not present

## 2023-12-10 DIAGNOSIS — Z79899 Other long term (current) drug therapy: Secondary | ICD-10-CM

## 2023-12-10 DIAGNOSIS — R008 Other abnormalities of heart beat: Secondary | ICD-10-CM | POA: Diagnosis present

## 2023-12-10 DIAGNOSIS — I700 Atherosclerosis of aorta: Secondary | ICD-10-CM | POA: Diagnosis not present

## 2023-12-10 DIAGNOSIS — K625 Hemorrhage of anus and rectum: Secondary | ICD-10-CM | POA: Diagnosis not present

## 2023-12-10 DIAGNOSIS — R0902 Hypoxemia: Secondary | ICD-10-CM | POA: Diagnosis not present

## 2023-12-10 LAB — BASIC METABOLIC PANEL WITH GFR
Anion gap: 13 (ref 5–15)
BUN: 26 mg/dL — ABNORMAL HIGH (ref 8–23)
CO2: 20 mmol/L — ABNORMAL LOW (ref 22–32)
Calcium: 7.8 mg/dL — ABNORMAL LOW (ref 8.9–10.3)
Chloride: 105 mmol/L (ref 98–111)
Creatinine, Ser: 1.71 mg/dL — ABNORMAL HIGH (ref 0.44–1.00)
GFR, Estimated: 29 mL/min — ABNORMAL LOW (ref >60)
Glucose, Bld: 218 mg/dL — ABNORMAL HIGH (ref 70–99)
Potassium: 3.1 mmol/L — ABNORMAL LOW (ref 3.5–5.1)
Sodium: 138 mmol/L (ref 135–145)

## 2023-12-10 LAB — PROTIME-INR
INR: 1.9 — ABNORMAL HIGH (ref 0.8–1.2)
Prothrombin Time: 22.4 s — ABNORMAL HIGH (ref 11.4–15.2)

## 2023-12-10 LAB — HEPATIC FUNCTION PANEL
ALT: 14 U/L (ref 0–44)
AST: 20 U/L (ref 15–41)
Albumin: 3.1 g/dL — ABNORMAL LOW (ref 3.5–5.0)
Alkaline Phosphatase: 51 U/L (ref 38–126)
Bilirubin, Direct: 0.1 mg/dL (ref 0.0–0.2)
Indirect Bilirubin: 0.6 mg/dL (ref 0.3–0.9)
Total Bilirubin: 0.7 mg/dL (ref 0.0–1.2)
Total Protein: 5.3 g/dL — ABNORMAL LOW (ref 6.5–8.1)

## 2023-12-10 LAB — CBC
HCT: 15.2 % — ABNORMAL LOW (ref 36.0–46.0)
Hemoglobin: 4.8 g/dL — CL (ref 12.0–15.0)
MCH: 28.1 pg (ref 26.0–34.0)
MCHC: 31.6 g/dL (ref 30.0–36.0)
MCV: 88.9 fL (ref 80.0–100.0)
Platelets: 477 10*3/uL — ABNORMAL HIGH (ref 150–400)
RBC: 1.71 MIL/uL — ABNORMAL LOW (ref 3.87–5.11)
RDW: 15.7 % — ABNORMAL HIGH (ref 11.5–15.5)
WBC: 11.8 10*3/uL — ABNORMAL HIGH (ref 4.0–10.5)
nRBC: 0 % (ref 0.0–0.2)

## 2023-12-10 LAB — IRON AND TIBC
Iron: 11 ug/dL — ABNORMAL LOW (ref 28–170)
Saturation Ratios: 4 % — ABNORMAL LOW (ref 10.4–31.8)
TIBC: 315 ug/dL (ref 250–450)
UIBC: 304 ug/dL

## 2023-12-10 LAB — TROPONIN I (HIGH SENSITIVITY)
Troponin I (High Sensitivity): 453 ng/L (ref <18)
Troponin I (High Sensitivity): 453 ng/L (ref <18)

## 2023-12-10 LAB — RETICULOCYTES
Immature Retic Fract: 36.7 % — ABNORMAL HIGH (ref 2.3–15.9)
RBC.: 1.67 MIL/uL — ABNORMAL LOW (ref 3.87–5.11)
Retic Count, Absolute: 105.2 10*3/uL (ref 19.0–186.0)
Retic Ct Pct: 6.3 % — ABNORMAL HIGH (ref 0.4–3.1)

## 2023-12-10 LAB — MAGNESIUM: Magnesium: 1.7 mg/dL (ref 1.7–2.4)

## 2023-12-10 LAB — POC OCCULT BLOOD, ED: Fecal Occult Bld: POSITIVE — AB

## 2023-12-10 LAB — FERRITIN: Ferritin: 13 ng/mL (ref 11–307)

## 2023-12-10 LAB — VITAMIN B12: Vitamin B-12: 1735 pg/mL — ABNORMAL HIGH (ref 180–914)

## 2023-12-10 LAB — PREPARE RBC (CROSSMATCH)

## 2023-12-10 LAB — FOLATE: Folate: 15.5 ng/mL (ref >5.9)

## 2023-12-10 MED ORDER — ENOXAPARIN SODIUM 40 MG/0.4ML IJ SOSY: 40.0000 mg | PREFILLED_SYRINGE | INTRAMUSCULAR | Status: DC

## 2023-12-10 MED ORDER — ONDANSETRON HCL 4 MG/2ML IJ SOLN
4.0000 mg | Freq: Four times a day (QID) | INTRAMUSCULAR | Status: DC | PRN
Start: 2023-12-10 — End: 2023-12-14

## 2023-12-10 MED ORDER — ACETAMINOPHEN 650 MG RE SUPP: 650.0000 mg | Freq: Four times a day (QID) | RECTAL | Status: DC | PRN

## 2023-12-10 MED ORDER — ATORVASTATIN CALCIUM 40 MG PO TABS
40.0000 mg | ORAL_TABLET | Freq: Every day | ORAL | Status: DC
  Administered 2023-12-11 – 2023-12-14 (×4): 40 mg via ORAL
  Filled 2023-12-10 (×4): qty 1

## 2023-12-10 MED ORDER — ACETAMINOPHEN 325 MG PO TABS: 650.0000 mg | ORAL_TABLET | Freq: Four times a day (QID) | ORAL | Status: DC | PRN

## 2023-12-10 MED ORDER — SODIUM CHLORIDE 0.9% IV SOLUTION: Freq: Once | INTRAVENOUS | Status: DC

## 2023-12-10 MED ORDER — PANTOPRAZOLE SODIUM 40 MG IV SOLR
40.0000 mg | INTRAVENOUS | Status: AC
  Administered 2023-12-10 (×2): 40 mg via INTRAVENOUS
  Filled 2023-12-10: qty 10

## 2023-12-10 MED ORDER — OXYCODONE HCL 5 MG PO TABS
5.0000 mg | ORAL_TABLET | ORAL | Status: DC | PRN
  Administered 2023-12-11 – 2023-12-12 (×3): 5 mg via ORAL
  Filled 2023-12-10 (×3): qty 1

## 2023-12-10 MED ORDER — ONDANSETRON HCL 4 MG PO TABS: 4.0000 mg | ORAL_TABLET | Freq: Four times a day (QID) | ORAL | Status: DC | PRN

## 2023-12-10 MED ORDER — POTASSIUM CHLORIDE 10 MEQ/100ML IV SOLN
10.0000 meq | INTRAVENOUS | Status: AC
  Administered 2023-12-10 (×3): 10 meq via INTRAVENOUS
  Filled 2023-12-10 (×3): qty 100

## 2023-12-10 MED ORDER — LEVOTHYROXINE SODIUM 88 MCG PO TABS
88.0000 ug | ORAL_TABLET | Freq: Every day | ORAL | Status: DC
  Administered 2023-12-11 – 2023-12-14 (×4): 88 ug via ORAL
  Filled 2023-12-10 (×4): qty 1

## 2023-12-10 MED ORDER — METOPROLOL SUCCINATE ER 25 MG PO TB24
12.5000 mg | ORAL_TABLET | Freq: Every day | ORAL | Status: DC
  Administered 2023-12-12 – 2023-12-14 (×3): 12.5 mg via ORAL
  Filled 2023-12-10 (×4): qty 1

## 2023-12-10 MED ORDER — PANTOPRAZOLE SODIUM 40 MG IV SOLR
40.0000 mg | Freq: Two times a day (BID) | INTRAVENOUS | Status: DC
  Administered 2023-12-11 – 2023-12-14 (×7): 40 mg via INTRAVENOUS
  Filled 2023-12-10 (×7): qty 10

## 2023-12-10 MED ORDER — APIXABAN 5 MG PO TABS: 5.0000 mg | ORAL_TABLET | Freq: Two times a day (BID) | ORAL | Status: DC

## 2023-12-10 MED ORDER — AMIODARONE HCL 200 MG PO TABS
100.0000 mg | ORAL_TABLET | Freq: Every day | ORAL | Status: DC
  Administered 2023-12-11 – 2023-12-14 (×4): 100 mg via ORAL
  Filled 2023-12-10 (×4): qty 1

## 2023-12-10 NOTE — H&P (Addendum)
 History and Physical    Dana Sandoval Dana Sandoval DOB: [birth year] DOA: 12/10/2023  PCP: Donnie Galea, MD   Chief Complaint: melena  HPI: Dana Sandoval is a 83 y.o. female with medical history significant of severe aortic stenosis, hypothyroidism, hypertension, hyperlipidemia who presented to the emergency department due to progressively worsening fatigue and weakness.  Patient was unable to stand or take care of herself due to severe fatigue.  Over the last week she has had dark bloody diarrhea.  She presented to emergency department where she was found to be hypotensive and hemodynamically stable.  Labs were obtained which showed WBC 11.8, hemoglobin 4.8 baseline around 10, platelets 477, potassium 3.1, creatinine 1.7, troponin 453, 453, INR 1.9, reticulocyte 6.3, LFTs within normal limits.  Patient had chest x-ray which showed right-sided pleural effusion and patchy opacities.  CT abdomen pelvis demonstrated pancreatic cyst and bilateral pleural effusions.  Patient was given blood transfusion and admitted for further workup.  Patient was ordered 4 units by emergency department and responded appropriately.  She states that since receiving blood transfusion she is feeling much better.   Review of Systems: Review of Systems  Constitutional:  Negative for chills, fever and weight loss.  HENT: Negative.    Eyes: Negative.   Respiratory: Negative.    Cardiovascular: Negative.   Gastrointestinal: Negative.   Genitourinary: Negative.   Musculoskeletal: Negative.   Skin: Negative.   Neurological: Negative.   Endo/Heme/Allergies: Negative.   Psychiatric/Behavioral: Negative.       As per HPI otherwise 10 point review of systems negative.   Allergies  Allergen Reactions   Codeine Itching    After 3 days   Dilaudid [Hydromorphone Hcl] Itching    After 3 days    Past Medical History:  Diagnosis Date   Arthritis    B12 deficiency    Diabetes mellitus    type 2    Hyperlipidemia    Hypertension    Hypothyroidism    IBS (irritable bowel syndrome)    Macular degeneration    Macular degeneration of right eye    Severe aortic stenosis    Thyroid  disease    HYPOTHYROIDISM    Past Surgical History:  Procedure Laterality Date   ABDOMINAL HYSTERECTOMY  1977   TAH  (FIBROIDS)   CATARACT EXTRACTION Bilateral    DILATION AND CURETTAGE OF UTERUS     DOPPLER ECHOCARDIOGRAPHY  2009   HAD AN ECHOCARDIOGRAM THAT SHOWED MODERATE MITRAL  ANNULAR CALCIFICATION AND A NORMAL EJECTION FRACTION OF 55%   RIGHT HEART CATH AND CORONARY ANGIOGRAPHY N/A 10/24/2023   Procedure: RIGHT HEART CATH AND CORONARY ANGIOGRAPHY;  Surgeon: [name], MD;  Location: [facility];  Service: Cardiovascular;  Laterality: N/A;   SHOULDER SURGERY     RIGHT SHOULDER SURGERY   THYROIDECTOMY     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   RIGHT   TOTAL KNEE ARTHROPLASTY Left 09/10/2018   Procedure: TOTAL KNEE ARTHROPLASTY;  Surgeon: Dayne Even, MD;  Location: [facility];  Service: Orthopedics;  Laterality: Left;     reports that she has never smoked. She has never used smokeless tobacco. She reports that she does not drink alcohol and does not use drugs.  Family History  Problem Relation Age of Onset   Osteoporosis Mother    Heart disease Mother    Macular degeneration Mother    Heart disease Father    Cancer Father        BRAIN TUMOR   Cancer  Brother        BRAIN TUMOR   Diabetes Maternal Aunt    Diabetes Maternal Uncle    Stroke Paternal Grandfather    Colon cancer Neg Hx    Breast cancer Neg Hx    Migraines Neg Hx     Prior to Admission medications   Medication Sig Start Date End Date Taking? Authorizing Provider  amiodarone  (PACERONE ) 200 MG tablet Take 0.5 tablets (100 mg total) by mouth daily. 11/22/23   [name], MD  apixaban  (ELIQUIS ) 5 MG TABS tablet Take 1 tablet (5 mg total) by mouth 2 (two) times daily. 10/24/23   [name], MD  Ascorbic Acid (VITAMIN C  PO) Take 500 mg by mouth in the morning.    [provider]  atorvastatin  (LIPITOR) 40 MG tablet Take 1 tablet (40 mg total) by mouth daily at 6PM. 10/16/23   Donnie Galea, MD  Blood Glucose Monitoring Suppl (ONETOUCH VERIO FLEX SYSTEM) w/Device KIT use to test blood sugar in the morning, noon and at bedtime 11/21/23   Donnie Galea, MD  furosemide  (LASIX ) 20 MG tablet Take 1 tablet (20 mg total) by mouth 2 (two) times daily. 11/08/23   Thukkani, Arun K, MD  glucose blood (ONETOUCH VERIO) test strip use to test blood glucose levels in the morning, at noon, and at bedtime. 11/21/23 02/29/24  Donnie Galea, MD  levothyroxine  (SYNTHROID ) 88 MCG tablet Take 1 tablet (88 mcg total) by mouth daily. 10/16/23   Donnie Galea, MD  metoprolol  succinate (TOPROL -XL) 25 MG 24 hr tablet Take 0.5 tablets (12.5 mg total) by mouth daily. Take with or immediately following a meal. 11/22/23   [name], MD  Multiple Vitamins-Minerals (PRESERVISION AREDS 2 PO) Take 1 tablet by mouth 2 (two) times daily.     [provider]  OneTouch Delica Lancets 33G MISC use one in the morning, at noon, and at bedtime to check blood glucose levels 11/21/23   Donnie Galea, MD  valACYclovir  (VALTREX ) 500 MG tablet Take 1 tablet (500 mg total) by mouth daily as needed (fever blister). 10/16/23   Donnie Galea, MD  vitamin B-12 (CYANOCOBALAMIN ) 1000 MCG tablet Take 1 tablet (1,000 mcg total) by mouth daily. 09/15/21   Donnie Galea, MD  VITAMIN D  PO Take 1,000 Units by mouth in the morning.    [provider]    Physical Exam: Vitals:   12/10/23 2015 12/10/23 2039 12/10/23 2115 12/10/23 2145  BP: (!) 102/43 (!) 106/50 115/66 (!) 112/53  Pulse: 93 92 92 91  Resp: (!) 22 (!) 24 20 (!) 25  Temp: 97.7 F (36.5 C) 98.7 F (37.1 C)    TempSrc: Oral Oral    SpO2: 99% 100% 100% 99%   Physical Exam Constitutional:      Appearance: She is normal weight.  HENT:     Head: Normocephalic.     Nose:  Nose normal.     Mouth/Throat:     Mouth: Mucous membranes are moist.     Pharynx: Oropharynx is clear.  Eyes:     Conjunctiva/sclera: Conjunctivae normal.     Pupils: Pupils are equal, round, and reactive to light.  Cardiovascular:     Rate and Rhythm: Normal rate and regular rhythm.     Pulses: Normal pulses.     Heart sounds: Normal heart sounds.  Pulmonary:     Effort: Pulmonary effort is normal.     Breath sounds: Normal breath sounds.  Abdominal:     General: Abdomen is flat. Bowel sounds are normal.  Musculoskeletal:        General: Normal range of motion.     Cervical back: Normal range of motion.  Skin:    General: Skin is warm.     Capillary Refill: Capillary refill takes less than 2 seconds.  Neurological:     General: No focal deficit present.     Mental Status: She is alert.  Psychiatric:        Mood and Affect: Mood normal.       Labs on Admission: I have personally reviewed the patients's labs and imaging studies.  Assessment/Plan Principal Problem:   Symptomatic anemia   # Acute on chronic symptomatic anemia - Patient on Eliquis  for history of A-fib - Endorsing black stools - Hemoglobin of 4 with baseline around 10  Plan: GI consulted by ED twice daily PPI Check H. pylori pathogen panel CBC Q8 Transfused 2 hemoglobin of 8 given cardiac history NPO at [facility] eliquis   # Aortic stenosis - Patient is currently undergoing workup for TAVR - Cardiology consulted  # Elevated troponin-likely related to demand ischemia  # Hyperlipidemia-continue statin  # History of paroxysmal A-fib-continue amiodarone .  Will hold Eliquis  in setting of active GI bleeding.  Continue metoprolol   # Hypothyroidism-continue levothyroxine    Admission status: Inpatient Progressive  Certification: The appropriate patient status for this patient is INPATIENT. Inpatient status is judged to be reasonable and necessary in order to provide the required intensity of service  to ensure the patient's safety. The patient's presenting symptoms, physical exam findings, and initial radiographic and laboratory data in the context of their chronic comorbidities is felt to place them at high risk for further clinical deterioration. Furthermore, it is not anticipated that the patient will be medically stable for discharge from the hospital within 2 midnights of admission.   * I certify that at the point of admission it is my clinical judgment that the patient will require inpatient hospital care spanning beyond 2 midnights from the point of admission due to high intensity of service, high risk for further deterioration and high frequency of surveillance required.Myrl Askew MD [facility] If 7PM-7AM, please contact night-coverage [email or web address]  12/10/2023, 10:26 PM

## 2023-12-10 NOTE — ED Triage Notes (Signed)
 Pt BIB [facility] from home d/t family reporting she began having back pain & pain between her shoulder blades, plus SOB & nausea. Family reports that she is supposed to have aortic valve replacement next Tuesday & her provider warned her to get evaluated if these s/s take place. Her SBP was in the 80's at home before coming here. 12L showed 6 beat run Bigeminy, received 500cc NS via 20g Lt AC PIV. CBG 398, is reported to have been taken off Metformin  last month.

## 2023-12-10 NOTE — Telephone Encounter (Signed)
 Noted. Thanks.

## 2023-12-10 NOTE — ED Provider Notes (Signed)
 [place] EMERGENCY DEPARTMENT AT [facility] Provider Note   CSN: [id number] Arrival date & time: 12/10/23  1558     History  Chief Complaint  Patient presents with   Back Pain   Hypotension    Dana Sandoval is a 83 y.o. female.  HPI     83yo female with a history of hyperlipidemia, diabetes, hypertension, hypothyroidism, aortic stenosis, TIA, atrial fibrillation on Eliquis , plan for TAVR June 10 for symptomatic AS, presents with concern for fatigue, dyspnea on exertion, dark colored diarrhea for one week and back pain.   1 week fatigue, progressively getting worse Today began to have back pain, walked to bathroom, feeling significant fatigue "like I was out of something" Now not having back pain.  Moving didn't change it. Located middle of back    1 week ago started having diarrhea, sometimes it would feel like gas and couldn't make it, was dark black color, some days more dark red, has been one time  Has had some dark stool intermittently over the last year Today the stool was more regular brown color, but in the week prior had been dark-black.  No nausea or vomiting No abdominal pain No fever Has been eating and drinking ok No falls or trauma  No syncope The worse felt was today No chest pain, does have shortness of breath On the eliquis , this morning last dose   Dr. Tova Fresh last colonoscopy a long time ago, husband saw Dr. Bridgett Camps. No hx of bleeding from stomach or bowels.   Past Medical History:  Diagnosis Date   Arthritis    B12 deficiency    Diabetes mellitus    type 2   Hyperlipidemia    Hypertension    Hypothyroidism    IBS (irritable bowel syndrome)    Macular degeneration    Macular degeneration of right eye    Severe aortic stenosis    Thyroid  disease    HYPOTHYROIDISM      Home Medications Prior to Admission medications   Medication Sig Start Date End Date Taking? Authorizing Provider  amiodarone  (PACERONE ) 200 MG tablet Take 0.5  tablets (100 mg total) by mouth daily. 11/22/23  Yes [name], MD  apixaban  (ELIQUIS ) 5 MG TABS tablet Take 1 tablet (5 mg total) by mouth 2 (two) times daily. 10/24/23  Yes [name], MD  Ascorbic Acid (VITAMIN C PO) Take 500 mg by mouth in the morning.   Yes [provider]  atorvastatin  (LIPITOR) 40 MG tablet Take 1 tablet (40 mg total) by mouth daily at [facility]. Patient taking differently: Take 40 mg by mouth daily after supper. 10/16/23  Yes Donnie Galea, MD  Cholecalciferol 25 MCG (1000 UT) capsule Take 1,000 Units by mouth daily.   Yes [provider]  Docosanol (ABREVA) 10 % CREA Apply 1 application  topically as needed (fever blister).   Yes [provider]  furosemide  (LASIX ) 20 MG tablet Take 1 tablet (20 mg total) by mouth 2 (two) times daily. 11/08/23  Yes Thukkani, Arun K, MD  glucose blood (ONETOUCH VERIO) test strip use to test blood glucose levels in the morning, at noon, and at bedtime. 11/21/23 02/29/24 Yes Donnie Galea, MD  levothyroxine  (SYNTHROID ) 88 MCG tablet Take 1 tablet (88 mcg total) by mouth daily. 10/16/23  Yes Donnie Galea, MD  metoprolol  succinate (TOPROL -XL) 25 MG 24 hr tablet Take 0.5 tablets (12.5 mg total) by mouth daily. Take with or immediately following a meal. 11/22/23  Yes [name], MD  Multiple Vitamins-Minerals (PRESERVISION AREDS 2 PO) Take 1 tablet by mouth 2 (two) times daily.    Yes [provider]  OneTouch Delica Lancets 33G MISC use one in the morning, at noon, and at bedtime to check blood glucose levels 11/21/23  Yes Donnie Galea, MD  pyridOXINE (B-6) 50 MG tablet Take 50 mg by mouth daily.   Yes [provider]  valACYclovir  (VALTREX ) 500 MG tablet Take 1 tablet (500 mg total) by mouth daily as needed (fever blister). 10/16/23  Yes Donnie Galea, MD  vitamin B-12 (CYANOCOBALAMIN ) 1000 MCG tablet Take 1 tablet (1,000 mcg total) by mouth daily. Patient taking differently: Take 1,000 mcg by mouth 3  (three) times a week. Take one tablet by mouth three times per week.  Take on Mon-Wed-Fr. 09/15/21  Yes Donnie Galea, MD      Allergies    Codeine and Dilaudid [hydromorphone hcl]    Review of Systems   Review of Systems  Physical Exam Updated Vital Signs BP (!) 112/53   Pulse 71   Temp 98 F (36.7 C) (Oral)   Resp 20   SpO2 97%  Physical Exam Vitals and nursing note reviewed.  Constitutional:      General: She is not in acute distress.    Appearance: She is well-developed. She is ill-appearing. She is not diaphoretic.     Comments: pale  HENT:     Head: Normocephalic and atraumatic.  Eyes:     Conjunctiva/sclera: Conjunctivae normal.  Cardiovascular:     Rate and Rhythm: Normal rate and regular rhythm.     Heart sounds: Normal heart sounds. No murmur heard.    No friction rub. No gallop.  Pulmonary:     Effort: Pulmonary effort is normal. No respiratory distress.     Breath sounds: Normal breath sounds. No wheezing or rales.  Abdominal:     General: There is no distension.     Palpations: Abdomen is soft.     Tenderness: There is abdominal tenderness (mild). There is no guarding.  Genitourinary:    Comments: Hemorrhoids, gross blood/maroon color No stool on rectal exam Musculoskeletal:        General: No tenderness.     Cervical back: Normal range of motion.  Skin:    General: Skin is warm and dry.     Findings: No erythema or rash.  Neurological:     Mental Status: She is alert and oriented to person, place, and time.     ED Results / Procedures / Treatments   Labs (all labs ordered are listed, but only abnormal results are displayed) Labs Reviewed  CBC - Abnormal; Notable for the following components:      Result Value   WBC 11.8 (*)    RBC 1.71 (*)    Hemoglobin 4.8 (*)    HCT 15.2 (*)    RDW 15.7 (*)    Platelets 477 (*)    All other components within normal limits  BASIC METABOLIC PANEL WITH GFR - Abnormal; Notable for the following components:    Potassium 3.1 (*)    CO2 20 (*)    Glucose, Bld 218 (*)    BUN 26 (*)    Creatinine, Ser 1.71 (*)    Calcium  7.8 (*)    GFR, Estimated 29 (*)    All other components within normal limits  VITAMIN B12 - Abnormal; Notable for the following components:   Vitamin B-12 1,735 (*)  All other components within normal limits  IRON AND TIBC - Abnormal; Notable for the following components:   Iron 11 (*)    Saturation Ratios 4 (*)    All other components within normal limits  RETICULOCYTES - Abnormal; Notable for the following components:   Retic Ct Pct 6.3 (*)    RBC. 1.67 (*)    Immature Retic Fract 36.7 (*)    All other components within normal limits  HEPATIC FUNCTION PANEL - Abnormal; Notable for the following components:   Total Protein 5.3 (*)    Albumin 3.1 (*)    All other components within normal limits  PROTIME-INR - Abnormal; Notable for the following components:   Prothrombin Time 22.4 (*)    INR 1.9 (*)    All other components within normal limits  BASIC METABOLIC PANEL WITH GFR - Abnormal; Notable for the following components:   Potassium 3.3 (*)    Glucose, Bld 155 (*)    BUN 28 (*)    Creatinine, Ser 1.46 (*)    Calcium  8.0 (*)    GFR, Estimated 35 (*)    All other components within normal limits  CBC - Abnormal; Notable for the following components:   WBC 12.6 (*)    RBC 3.76 (*)    Hemoglobin 10.4 (*)    HCT 30.7 (*)    RDW 15.7 (*)    nRBC 0.3 (*)    All other components within normal limits  CBC - Abnormal; Notable for the following components:   WBC 14.2 (*)    RBC 3.32 (*)    Hemoglobin 9.5 (*)    HCT 27.5 (*)    RDW 15.6 (*)    nRBC 0.4 (*)    All other components within normal limits  POC OCCULT BLOOD, ED - Abnormal; Notable for the following components:   Fecal Occult Bld POSITIVE (*)    All other components within normal limits  TROPONIN I (HIGH SENSITIVITY) - Abnormal; Notable for the following components:   Troponin I (High Sensitivity)  453 (*)    All other components within normal limits  TROPONIN I (HIGH SENSITIVITY) - Abnormal; Notable for the following components:   Troponin I (High Sensitivity) 453 (*)    All other components within normal limits  FOLATE  FERRITIN  MAGNESIUM  CBC  CBC  H. PYLORI ANTIGEN, STOOL  TYPE AND SCREEN  PREPARE RBC (CROSSMATCH)  PREPARE RBC (CROSSMATCH)    EKG EKG Interpretation Date/Time:  Monday December 10 2023 16:54:22 EDT Ventricular Rate:  90 PR Interval:    QRS Duration:  92 QT Interval:  448 QTC Calculation: 548 R Axis:   66  Text Interpretation: Atrial fibrillation versus sinus rhythm ST & T wave abnormality, consider lateral ischemia Prolonged QT Abnormal ECG When compared with ECG of 18-Oct-2023 08:57, PREVIOUS ECG IS PRESENT Confirmed by Scarlette Currier ([id number]) on 12/10/2023 6:01:03 PM  Radiology CT ABDOMEN PELVIS WO CONTRAST Result Date: 12/10/2023 CLINICAL DATA:  Back pain, anemia, concern for retroperitoneal hemorrhage EXAM: CT ABDOMEN AND PELVIS WITHOUT CONTRAST TECHNIQUE: Multidetector CT imaging of the abdomen and pelvis was performed following the standard protocol without IV contrast. RADIATION DOSE REDUCTION: This exam was performed according to the departmental dose-optimization program which includes automated exposure control, adjustment of the mA and/or kV according to patient size and/or use of iterative reconstruction technique. COMPARISON:  CTA chest abdomen pelvis dated 11/22/2023 FINDINGS: Lower chest: Moderate bilateral pleural effusions with associated bilateral lower lobe atelectasis Hepatobiliary: Liver  is unremarkable. Gallbladder is unremarkable. No intrahepatic or extrahepatic ductal dilatation. 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 No pancreatic atrophy or ductal dilatation. Spleen: Within normal limits. Adrenals/Urinary Tract: Adrenal glands are  within normal limits. Simple bilateral renal cysts, measuring up to 3.7 cm in the right lower kidney (image 34), benign (Bosniak I). No follow-up is recommended. 3 mm nonobstructing left upper pole renal calculus (image 21). No hydronephrosis. Bladder is underdistended but unremarkable. Stomach/Bowel: Stomach is within normal limits. No evidence of bowel obstruction. Appendix is not discretely visualized. No colonic wall thickening or inflammatory changes. Vascular/Lymphatic: Atherosclerotic calcifications of the abdominal aorta and branch vessels. No evidence of abdominal aortic aneurysm. No suspicious abdominopelvic lymphadenopathy. Reproductive: Status post hysterectomy. Bilateral ovaries are within normal limits. Other: No abdominopelvic ascites. Tiny fat containing periumbilical hernia. Musculoskeletal: No evidence of abdominal wall or retroperitoneal hematoma. Mild degenerative changes of the visualized thoracolumbar spine. IMPRESSION: No evidence of abdominal wall or retroperitoneal hematoma. 3 mm nonobstructing left upper pole renal calculus. No hydronephrosis. 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 Given the patient's age, dedicated follow-up is considered optional in 1 year. Moderate bilateral pleural effusions with associated bilateral lower lobe atelectasis. Electronically Signed   By: Zadie Herter M.D.   On: 12/10/2023 20:20   DG Chest Portable 1 View Result Date: 12/10/2023 CLINICAL DATA:  Dyspnea, pain between shoulder blades EXAM: PORTABLE CHEST - 1 VIEW COMPARISON:  September 02, 2018 FINDINGS: Blunting of the right costophrenic sulcus. Trace left pleural effusion. Few patchy airspace opacities are also noted in both lung bases. No pneumothorax. No cardiomegaly. Aortic atherosclerosis. No acute fracture or destructive lesion. Multilevel thoracic osteophytosis. 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 IMPRESSION: Likely layering right pleural effusion, small to moderate in volume. Trace left pleural effusion. Patchy airspace opacities in both lung bases, which may represent atelectasis or a developing bronchopneumonia, in the correct clinical context. Electronically Signed   By: Rance Burrows M.D.   On: 12/10/2023 18:58    Procedures .Critical Care  Performed by: Scarlette Currier, MD Authorized by: Scarlette Currier, MD   Critical care provider statement:    Critical care time (minutes):  30   Critical care was time spent personally by me on the following activities:  Development of treatment plan with patient or surrogate, discussions with consultants, evaluation of patient's response to treatment, examination of patient, ordering and review of laboratory studies, ordering and review of radiographic studies, ordering and performing treatments and interventions, pulse oximetry, re-evaluation of patient's condition and review of old charts     Medications Ordered in ED Medications  0.9 %  sodium chloride  infusion (Manually program via Guardrails IV Fluids) (has no administration in time range)  0.9 %  sodium chloride  infusion (Manually program via Guardrails IV Fluids) (has no administration in time range)  pantoprazole (PROTONIX) injection 40 mg (40 mg Intravenous Given 12/10/23 1851)    Followed by  pantoprazole (PROTONIX) injection 40 mg (40 mg Intravenous Given 12/11/23 0611)  amiodarone  (PACERONE ) tablet 100 mg (100 mg Oral Given 12/11/23 0959)  atorvastatin  (LIPITOR) tablet 40 mg (40 mg Oral Given 12/11/23 0959)  metoprolol  succinate (TOPROL -XL) 24 hr tablet 12.5 mg (12.5 mg Oral Not Given 12/11/23 1000)  levothyroxine  (SYNTHROID ) tablet 88 mcg (88 mcg Oral Given 12/11/23 0509)  acetaminophen  (TYLENOL ) tablet  650 mg (has no administration in time range)    Or  acetaminophen  (TYLENOL ) suppository 650 mg (has no administration in time range)   oxyCODONE  (Oxy IR/ROXICODONE ) immediate release tablet 5 mg (5 mg Oral Given 12/11/23 0153)  ondansetron  (ZOFRAN ) tablet 4 mg (has no administration in time range)    Or  ondansetron  (ZOFRAN ) injection 4 mg (has no administration in time range)  potassium chloride 10 mEq in 100 mL IVPB (0 mEq Intravenous Stopped 12/10/23 2244)    ED Course/ Medical Decision Making/ A&P                                   82yo female with a history of hyperlipidemia, diabetes, hypertension, hypothyroidism, aortic stenosis, TIA, atrial fibrillation on Eliquis , plan for TAVR June 10 for symptomatic AS, presents with concern for fatigue, dyspnea on exertion, dark colored diarrhea for one week and back pain.   DDx includes anemia, GI bleed, ACS, CHF, electrolyte abnormalities, symptomatic AS, dissection, retroperitoneal hemorrhage.    EKG with diffuse ischemic changes.  Labs completed and personally evaluated and interpreted by me show hemoglobin of 4.8 from 10.5 in April.    Blood pressures in 80s on arrival, remain in 90s.  Given significant anemia with hypotension, ordered 1 U emergency relesase blood, and 3 U of type/cross pRBC given significant anemia with signs of cardiac stress.  Did not reverse eliquis  as no clear active bleeding at this time-brown stool today--rectal exam did have maroon color but also hemorrhoids and did not have clear stool sample.    Lower suspicion clinically for aortic dissection, recently had CTA that showed no aneurysm.  Given back pain, did order CT to evaluate for signs of retroperitoneal hemorrhage as possible etiology of anemia which showed no evidence of hematoma, showed prior seen pancreatic cyst, effusions.  For concern GI bleed--gave transfusion, ordered pantoprazole. Sent message to  GI-recommend contact Dr. Tova Fresh to see if she is technically still under her care.  Troponin elevated to 453, diffuse changes on ECG---likely in the setting of AS and severe anemia.    Discussed with cardiology fellow as she is scheduled to see Dr. Lorie Rook for AS, however she is primary [facility] patient. Secretary paged dr. Berry Bristol, notified AM hospitalist who can follow up.   Vital signs improving with transfusion.  Admitted to hospitalist and awaiting bed.        Final Clinical Impression(s) / ED Diagnoses Final diagnoses:  Symptomatic anemia  Acute GI bleeding  Troponin level elevated    Rx / DC Orders ED Discharge Orders     None         Scarlette Currier, MD 12/11/23 1022

## 2023-12-10 NOTE — ED Notes (Signed)
 Pt to CT

## 2023-12-10 NOTE — Telephone Encounter (Addendum)
 I spoke with Brain Cahill is not on DPR; Amalia Badder is not sure if pt can come in for appt because she is so weak. Amalia Badder said thinks diarrhea may be coming from anxiety over upcoming heart surgery. Not sure why pt is so SOB.Pt will only want to see Dr Vallarie Gauze. Holding Dr Harrel Lim appt today at 3 PM. Amalia Badder is going to speak with pt and Adell Hones and I am to cb at 8:50 for answer. Amalia Badder does not think pt will go to ED.

## 2023-12-10 NOTE — ED Notes (Signed)
 This RN to assist pt with bedpan

## 2023-12-10 NOTE — ED Provider Triage Note (Signed)
 Emergency Medicine Provider Triage Evaluation Note  Dana Sandoval , a 83 y.o. female  was evaluated in triage.  Pt complains of aortic stenosis with plan for surgery by Dr. Deloise Ferries on June 10 who presents with concern for pain between her shoulder blades, shortness of breath, nausea, low blood pressure at home and in triage.  She had a 6 beat run of bigeminy, she received 500 cc normal saline bolus prior to arrival, CBG 398, reported to be taken off metformin  recently.  Review of Systems  Positive: Shortness of breath, weakness, dizziness, shoulder pain Negative: Chest pain  Physical Exam  BP (!) 105/48 (BP Location: Right Arm)   Pulse 91   Temp 97.8 F (36.6 C)   Resp 15   SpO2 100%  Gen:   Awake, no distress   Resp:  Normal effort  MSK:   Moves extremities without difficulty  Other:    Medical Decision Making  Medically screening exam initiated at 4:35 PM.  Appropriate orders placed.  Dana Sandoval was informed that the remainder of the evaluation will be completed by another provider, this initial triage assessment does not replace that evaluation, and the importance of remaining in the ED until their evaluation is complete.  Workup initiated in triage  Blood pressure improved in triage, but especially given her cardiac history will need close evaluation, cardiac monitoring.   Nelly Banco, [place] 12/10/23 1637

## 2023-12-10 NOTE — Telephone Encounter (Signed)
 I spoke with Dana Sandoval and pt does want to see Dr Vallarie Gauze today. Appt scheduled 12/10/23 at 3 pm with UC & ED precautions given and Dana Sandoval voiced understanding. Sending note to Dr Vallarie Gauze and Vallarie Gauze pool.

## 2023-12-11 DIAGNOSIS — R7989 Other specified abnormal findings of blood chemistry: Secondary | ICD-10-CM

## 2023-12-11 DIAGNOSIS — D649 Anemia, unspecified: Secondary | ICD-10-CM | POA: Diagnosis not present

## 2023-12-11 DIAGNOSIS — N179 Acute kidney failure, unspecified: Secondary | ICD-10-CM

## 2023-12-11 DIAGNOSIS — D500 Iron deficiency anemia secondary to blood loss (chronic): Secondary | ICD-10-CM

## 2023-12-11 DIAGNOSIS — I4891 Unspecified atrial fibrillation: Secondary | ICD-10-CM

## 2023-12-11 DIAGNOSIS — K922 Gastrointestinal hemorrhage, unspecified: Secondary | ICD-10-CM | POA: Insufficient documentation

## 2023-12-11 LAB — BASIC METABOLIC PANEL WITH GFR
Anion gap: 8 (ref 5–15)
BUN: 28 mg/dL — ABNORMAL HIGH (ref 8–23)
CO2: 23 mmol/L (ref 22–32)
Calcium: 8 mg/dL — ABNORMAL LOW (ref 8.9–10.3)
Chloride: 109 mmol/L (ref 98–111)
Creatinine, Ser: 1.46 mg/dL — ABNORMAL HIGH (ref 0.44–1.00)
GFR, Estimated: 35 mL/min — ABNORMAL LOW (ref >60)
Glucose, Bld: 155 mg/dL — ABNORMAL HIGH (ref 70–99)
Potassium: 3.3 mmol/L — ABNORMAL LOW (ref 3.5–5.1)
Sodium: 140 mmol/L (ref 135–145)

## 2023-12-11 LAB — CBC
HCT: 27.5 % — ABNORMAL LOW (ref 36.0–46.0)
HCT: 30.7 % — ABNORMAL LOW (ref 36.0–46.0)
HCT: 33.9 % — ABNORMAL LOW (ref 36.0–46.0)
HCT: 32.5 % — ABNORMAL LOW (ref 36.0–46.0)
Hemoglobin: 9.5 g/dL — ABNORMAL LOW (ref 12.0–15.0)
Hemoglobin: 10.4 g/dL — ABNORMAL LOW (ref 12.0–15.0)
Hemoglobin: 11.6 g/dL — ABNORMAL LOW (ref 12.0–15.0)
Hemoglobin: 11.2 g/dL — ABNORMAL LOW (ref 12.0–15.0)
MCH: 28.6 pg (ref 26.0–34.0)
MCH: 27.7 pg (ref 26.0–34.0)
MCH: 27.6 pg (ref 26.0–34.0)
MCH: 28.3 pg (ref 26.0–34.0)
MCHC: 34.5 g/dL (ref 30.0–36.0)
MCHC: 33.9 g/dL (ref 30.0–36.0)
MCHC: 34.2 g/dL (ref 30.0–36.0)
MCHC: 34.5 g/dL (ref 30.0–36.0)
MCV: 82.8 fL (ref 80.0–100.0)
MCV: 81.6 fL (ref 80.0–100.0)
MCV: 80.7 fL (ref 80.0–100.0)
MCV: 82.1 fL (ref 80.0–100.0)
Platelets: 367 10*3/uL (ref 150–400)
Platelets: 338 10*3/uL (ref 150–400)
Platelets: 364 10*3/uL (ref 150–400)
Platelets: 362 10*3/uL (ref 150–400)
RBC: 3.32 MIL/uL — ABNORMAL LOW (ref 3.87–5.11)
RBC: 3.76 MIL/uL — ABNORMAL LOW (ref 3.87–5.11)
RBC: 4.2 MIL/uL (ref 3.87–5.11)
RBC: 3.96 MIL/uL (ref 3.87–5.11)
RDW: 15.6 % — ABNORMAL HIGH (ref 11.5–15.5)
RDW: 15.7 % — ABNORMAL HIGH (ref 11.5–15.5)
RDW: 15.9 % — ABNORMAL HIGH (ref 11.5–15.5)
RDW: 15.9 % — ABNORMAL HIGH (ref 11.5–15.5)
WBC: 14.2 10*3/uL — ABNORMAL HIGH (ref 4.0–10.5)
WBC: 12.6 10*3/uL — ABNORMAL HIGH (ref 4.0–10.5)
WBC: 13 10*3/uL — ABNORMAL HIGH (ref 4.0–10.5)
WBC: 12 10*3/uL — ABNORMAL HIGH (ref 4.0–10.5)
nRBC: 0.4 % — ABNORMAL HIGH (ref 0.0–0.2)
nRBC: 0.3 % — ABNORMAL HIGH (ref 0.0–0.2)
nRBC: 0.2 % (ref 0.0–0.2)
nRBC: 0.2 % (ref 0.0–0.2)

## 2023-12-11 MED ORDER — PEG 3350-KCL-NA BICARB-NACL 420 G PO SOLR
4000.0000 mL | Freq: Once | ORAL | Status: AC
  Administered 2023-12-11: 4000 mL via ORAL
  Filled 2023-12-11: qty 4000

## 2023-12-11 MED ORDER — SODIUM CHLORIDE 0.9 % IV SOLN
INTRAVENOUS | Status: AC
Start: 2023-12-11 — End: 2023-12-12

## 2023-12-11 NOTE — H&P (View-Only) (Signed)
 Reason for Consult: GI bleed Referring Physician: [facility]  Dana Sandoval HPI: This is an 83 year old female with a PMH of severe aortic stenosis, HTN, hyperlipidemia, and DM admitted for symptomatic anemia.  Starting two weeks ago she started to have black "jelly" colored stool.  On a daily basis she was experiencing at least one bloody bowel movement.  Over the course of time she started to experience more fatigue and SOB.  The patient was previously independent and ambulatory without any assistance, but just prior to her admission she needed to move about in a wheelchair with assistance.  Her last colonoscopy was with Dr. Tova Fresh on 12/26/2017 for a positive Cologuard.  The examination was normal at that time.  The patient has a history of severe aortic stenosis and she is scheduled for a TVAR.  The patient does use Eliquis  on a daily basis.  Since admission she is feeling much better with the 4 units of PRBC.  Her current HGB increased fro 10.4 g/dL up from 4.8 g/dL.  The patient's troponin was elevated at 453 and this was felt to be from demand ischemia.  The patient denies any issues with GERD or abdominal pain.  Past Medical History:  Diagnosis Date   Arthritis    B12 deficiency    Diabetes mellitus    type 2   Hyperlipidemia    Hypertension    Hypothyroidism    IBS (irritable bowel syndrome)    Macular degeneration    Macular degeneration of right eye    Severe aortic stenosis    Thyroid  disease    HYPOTHYROIDISM    Past Surgical History:  Procedure Laterality Date   ABDOMINAL HYSTERECTOMY  1977   TAH  (FIBROIDS)   CATARACT EXTRACTION Bilateral    DILATION AND CURETTAGE OF UTERUS     DOPPLER ECHOCARDIOGRAPHY  2009   HAD AN ECHOCARDIOGRAM THAT SHOWED MODERATE MITRAL  ANNULAR CALCIFICATION AND A NORMAL EJECTION FRACTION OF 55%   RIGHT HEART CATH AND CORONARY ANGIOGRAPHY N/A 10/24/2023   Procedure: RIGHT HEART CATH AND CORONARY ANGIOGRAPHY;  Surgeon: [name], MD;   Location: [facility];  Service: Cardiovascular;  Laterality: N/A;   SHOULDER SURGERY     RIGHT SHOULDER SURGERY   THYROIDECTOMY     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   RIGHT   TOTAL KNEE ARTHROPLASTY Left 09/10/2018   Procedure: TOTAL KNEE ARTHROPLASTY;  Surgeon: Dayne Even, MD;  Location: [facility];  Service: Orthopedics;  Laterality: Left;    Family History  Problem Relation Age of Onset   Osteoporosis Mother    Heart disease Mother    Macular degeneration Mother    Heart disease Father    Cancer Father        BRAIN TUMOR   Cancer Brother        BRAIN TUMOR   Diabetes Maternal Aunt    Diabetes Maternal Uncle    Stroke Paternal Grandfather    Colon cancer Neg Hx    Breast cancer Neg Hx    Migraines Neg Hx     Social History:  reports that she has never smoked. She has never used smokeless tobacco. She reports that she does not drink alcohol and does not use drugs.  Allergies:  Allergies  Allergen Reactions   Codeine Itching    After 3 days   Dilaudid [Hydromorphone Hcl] Itching    After 3 days    Medications: Scheduled:  sodium chloride    Intravenous Once  sodium chloride    Intravenous Once   amiodarone   100 mg Oral Daily   atorvastatin   40 mg Oral Daily   levothyroxine   88 mcg Oral Q0600   metoprolol  succinate  12.5 mg Oral Daily   pantoprazole (PROTONIX) IV  40 mg Intravenous Q12H   Continuous:  Results for orders placed or performed during the hospital encounter of 12/10/23 (from the past 24 hours)  CBC     Status: Abnormal   Collection Time: 12/10/23  4:47 PM  Result Value Ref Range   WBC 11.8 (H) 4.0 - 10.5 K/uL   RBC 1.71 (L) 3.87 - 5.11 MIL/uL   Hemoglobin 4.8 (LL) 12.0 - 15.0 g/dL   HCT 16.1 (L) 09.6 - 04.5 %   MCV 88.9 80.0 - 100.0 fL   MCH 28.1 26.0 - 34.0 pg   MCHC 31.6 30.0 - 36.0 g/dL   RDW 40.9 (H) 81.1 - 91.4 %   Platelets 477 (H) 150 - 400 K/uL   nRBC 0.0 0.0 - 0.2 %  Basic metabolic panel     Status: Abnormal   Collection  Time: 12/10/23  4:47 PM  Result Value Ref Range   Sodium 138 135 - 145 mmol/L   Potassium 3.1 (L) 3.5 - 5.1 mmol/L   Chloride 105 98 - 111 mmol/L   CO2 20 (L) 22 - 32 mmol/L   Glucose, Bld 218 (H) 70 - 99 mg/dL   BUN 26 (H) 8 - 23 mg/dL   Creatinine, Ser 7.82 (H) 0.44 - 1.00 mg/dL   Calcium  7.8 (L) 8.9 - 10.3 mg/dL   GFR, Estimated 29 (L) >60 mL/min   Anion gap 13 5 - 15  Troponin I (High Sensitivity)     Status: Abnormal   Collection Time: 12/10/23  4:47 PM  Result Value Ref Range   Troponin I (High Sensitivity) 453 (HH) <18 ng/L  Prepare RBC (crossmatch)     Status: None   Collection Time: 12/10/23  5:35 PM  Result Value Ref Range   Order Confirmation      ORDER PROCESSED BY BLOOD BANK Performed at [facility], [address] [place], [place] [address]   Type and screen [facility]     Status: None (Preliminary result)   Collection Time: 12/10/23  5:55 PM  Result Value Ref Range   ABO/RH(D) A POS    Antibody Screen NEG    Sample Expiration 12/13/2023,2359    Unit Number [id number]    Blood Component Type RED CELLS,LR    Unit division 00    Status of Unit ISSUED,FINAL    Transfusion Status OK TO TRANSFUSE    Crossmatch Result Compatible    Unit Number [id number]    Blood Component Type RED CELLS,LR    Unit division 00    Status of Unit ISSUED,FINAL    Transfusion Status OK TO TRANSFUSE    Crossmatch Result Compatible    Unit Number [id number]    Blood Component Type RED CELLS,LR    Unit division 00    Status of Unit ISSUED    Transfusion Status OK TO TRANSFUSE    Crossmatch Result Compatible    Unit Number [id number]    Blood Component Type RED CELLS,LR    Unit division 00    Status of Unit ISSUED,FINAL    Transfusion Status OK TO TRANSFUSE    Crossmatch Result COMPATIBLE   POC occult blood, ED     Status: Abnormal   Collection Time:  12/10/23  6:01 PM  Result Value Ref Range   Fecal Occult Bld POSITIVE (A) NEGATIVE  Iron  and TIBC     Status: Abnormal   Collection Time: 12/10/23  6:05 PM  Result Value Ref Range   Iron 11 (L) 28 - 170 ug/dL   TIBC 161 096 - 045 ug/dL   Saturation Ratios 4 (L) 10.4 - 31.8 %   UIBC 304 ug/dL  Ferritin     Status: None   Collection Time: 12/10/23  6:05 PM  Result Value Ref Range   Ferritin 13 11 - 307 ng/mL  Hepatic function panel     Status: Abnormal   Collection Time: 12/10/23  6:05 PM  Result Value Ref Range   Total Protein 5.3 (L) 6.5 - 8.1 g/dL   Albumin 3.1 (L) 3.5 - 5.0 g/dL   AST 20 15 - 41 U/L   ALT 14 0 - 44 U/L   Alkaline Phosphatase 51 38 - 126 U/L   Total Bilirubin 0.7 0.0 - 1.2 mg/dL   Bilirubin, Direct 0.1 0.0 - 0.2 mg/dL   Indirect Bilirubin 0.6 0.3 - 0.9 mg/dL  Magnesium     Status: None   Collection Time: 12/10/23  6:05 PM  Result Value Ref Range   Magnesium 1.7 1.7 - 2.4 mg/dL  Prepare RBC     Status: None   Collection Time: 12/10/23  6:05 PM  Result Value Ref Range   Order Confirmation      ORDER PROCESSED BY BLOOD BANK Performed at [facility], [address], [place], [place] [address]   Vitamin B12     Status: Abnormal   Collection Time: 12/10/23  6:06 PM  Result Value Ref Range   Vitamin B-12 1,735 (H) 180 - 914 pg/mL  Folate     Status: None   Collection Time: 12/10/23  6:06 PM  Result Value Ref Range   Folate 15.5 >5.9 ng/mL  Reticulocytes     Status: Abnormal   Collection Time: 12/10/23  6:06 PM  Result Value Ref Range   Retic Ct Pct 6.3 (H) 0.4 - 3.1 %   RBC. 1.67 (L) 3.87 - 5.11 MIL/uL   Retic Count, Absolute 105.2 19.0 - 186.0 K/uL   Immature Retic Fract 36.7 (H) 2.3 - 15.9 %  Protime-INR     Status: Abnormal   Collection Time: 12/10/23  6:54 PM  Result Value Ref Range   Prothrombin Time 22.4 (H) 11.4 - 15.2 seconds   INR 1.9 (H) 0.8 - 1.2  Troponin I (High Sensitivity)     Status: Abnormal   Collection Time: 12/10/23  6:55 PM  Result Value Ref Range   Troponin I (High Sensitivity) 453 (HH) <18 ng/L  CBC      Status: Abnormal   Collection Time: 12/10/23 10:31 PM  Result Value Ref Range   WBC 14.2 (H) 4.0 - 10.5 K/uL   RBC 3.32 (L) 3.87 - 5.11 MIL/uL   Hemoglobin 9.5 (L) 12.0 - 15.0 g/dL   HCT 19.1 (L) 47.8 - 29.5 %   MCV 82.8 80.0 - 100.0 fL   MCH 28.6 26.0 - 34.0 pg   MCHC 34.5 30.0 - 36.0 g/dL   RDW 62.1 (H) 30.8 - 65.7 %   Platelets 367 150 - 400 K/uL   nRBC 0.4 (H) 0.0 - 0.2 %  Basic metabolic panel     Status: Abnormal   Collection Time: 12/11/23  5:51 AM  Result Value Ref Range  Sodium 140 135 - 145 mmol/L   Potassium 3.3 (L) 3.5 - 5.1 mmol/L   Chloride 109 98 - 111 mmol/L   CO2 23 22 - 32 mmol/L   Glucose, Bld 155 (H) 70 - 99 mg/dL   BUN 28 (H) 8 - 23 mg/dL   Creatinine, Ser 1.61 (H) 0.44 - 1.00 mg/dL   Calcium  8.0 (L) 8.9 - 10.3 mg/dL   GFR, Estimated 35 (L) >60 mL/min   Anion gap 8 5 - 15  CBC     Status: Abnormal   Collection Time: 12/11/23  5:51 AM  Result Value Ref Range   WBC 12.6 (H) 4.0 - 10.5 K/uL   RBC 3.76 (L) 3.87 - 5.11 MIL/uL   Hemoglobin 10.4 (L) 12.0 - 15.0 g/dL   HCT 09.6 (L) 04.5 - 40.9 %   MCV 81.6 80.0 - 100.0 fL   MCH 27.7 26.0 - 34.0 pg   MCHC 33.9 30.0 - 36.0 g/dL   RDW 81.1 (H) 91.4 - 78.2 %   Platelets 338 150 - 400 K/uL   nRBC 0.3 (H) 0.0 - 0.2 %     CT ABDOMEN PELVIS WO CONTRAST Result Date: 12/10/2023 CLINICAL DATA:  Back pain, anemia, concern for retroperitoneal hemorrhage EXAM: CT ABDOMEN AND PELVIS WITHOUT CONTRAST TECHNIQUE: Multidetector CT imaging of the abdomen and pelvis was performed following the standard protocol without IV contrast. RADIATION DOSE REDUCTION: This exam was performed according to the departmental dose-optimization program which includes automated exposure control, adjustment of the mA and/or kV according to patient size and/or use of iterative reconstruction technique. COMPARISON:  CTA chest abdomen pelvis dated 11/22/2023 FINDINGS: Lower chest: Moderate bilateral pleural effusions with associated bilateral lower lobe  atelectasis Hepatobiliary: Liver is unremarkable. Gallbladder is unremarkable. No intrahepatic or extrahepatic ductal dilatation. 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 No pancreatic atrophy or ductal dilatation. Spleen: Within normal limits. Adrenals/Urinary Tract: Adrenal glands are within normal limits. Simple bilateral renal cysts, measuring up to 3.7 cm in the right lower kidney (image 34), benign (Bosniak I). No follow-up is recommended. 3 mm nonobstructing left upper pole renal calculus (image 21). No hydronephrosis. Bladder is underdistended but unremarkable. Stomach/Bowel: Stomach is within normal limits. No evidence of bowel obstruction. Appendix is not discretely visualized. No colonic wall thickening or inflammatory changes. Vascular/Lymphatic: Atherosclerotic calcifications of the abdominal aorta and branch vessels. No evidence of abdominal aortic aneurysm. No suspicious abdominopelvic lymphadenopathy. Reproductive: Status post hysterectomy. Bilateral ovaries are within normal limits. Other: No abdominopelvic ascites. Tiny fat containing periumbilical hernia. Musculoskeletal: No evidence of abdominal wall or retroperitoneal hematoma. Mild degenerative changes of the visualized thoracolumbar spine. IMPRESSION: No evidence of abdominal wall or retroperitoneal hematoma. 3 mm nonobstructing left upper pole renal calculus. No hydronephrosis. 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 Given the patient's age, dedicated follow-up is considered optional in 1 year. Moderate bilateral pleural effusions with associated bilateral lower lobe atelectasis. Electronically Signed   By: Zadie Herter M.D.   On: 12/10/2023 20:20   DG Chest Portable 1 View Result Date: 12/10/2023 CLINICAL DATA:  Dyspnea, pain between shoulder blades EXAM: PORTABLE  CHEST - 1 VIEW COMPARISON:  September 02, 2018 FINDINGS: Blunting of the right costophrenic sulcus. Trace left pleural effusion. Few patchy airspace opacities are also noted in both lung bases. No pneumothorax. No cardiomegaly. Aortic atherosclerosis. No acute fracture or destructive  lesion. Multilevel thoracic osteophytosis. 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 IMPRESSION: Likely layering right pleural effusion, small to moderate in volume. Trace left pleural effusion. Patchy airspace opacities in both lung bases, which may represent atelectasis or a developing bronchopneumonia, in the correct clinical context. Electronically Signed   By: Rance Burrows M.D.   On: 12/10/2023 18:58    ROS:  As stated above in the HPI otherwise negative.  Blood pressure (!) 120/46, pulse 71, temperature 98.3 F (36.8 C), temperature source Oral, resp. rate (!) 24, SpO2 98%.    PE: Gen: NAD, Alert and Oriented HEENT:  [place]/AT, EOMI Neck: Supple, no LAD Lungs: CTA Bilaterally CV: RRR without M/G/R ABD: Soft, NTND, +BS Ext: No C/C/E  Assessment/Plan: 1) GI bleed. 2) Symptomatic anemia. 3) Severe aortic stenosis.   The patient is clinically stable.  She will need further evaluation with an EGD/colonoscopy.  It is not clear about the source of her GI bleed with the "jelly"-like dark stool in the absence of diverticula.  Plan: 1) EGD/colonoscopy with Dr. Tova Fresh at 3 PM. 2) Follow HGB and transfuse if necessary. 3) PPI for now.  Chae Shuster D 12/11/2023, 12:39 PM

## 2023-12-11 NOTE — Hospital Course (Signed)
 83 y.o. female with medical history significant of severe aortic stenosis, hypothyroidism, hypertension, hyperlipidemia who presented to the emergency department due to progressively worsening fatigue and weakness.  Patient was unable to stand or take care of herself due to severe fatigue.  Over the last week she has had dark bloody diarrhea.  She presented to emergency department where she was found to be hypotensive and hemodynamically stable.  Labs were obtained which showed WBC 11.8, hemoglobin 4.8 baseline around 10.   Assessment and Plan:   Acute on chronic blood loss anemia - Noted dark stools prior to presentation.  Hemoglobin 4.8 on presentation.  Status post 4 units packed RBCs.  GI consulted, following closely.  Holding Eliquis .  Iron studies showing severe iron deficiency with iron 11, percent sat 4, ferritin 13.  Will hold off on IV iron supplementation as patient has already received 4 units PRBCs.  Would likely benefit from p.o. iron upon discharge.   Likely upper GI bleed - Patient endorses dark stools.  Denies any NSAIDs, alcohol use, history of previous GI bleed.  No epigastric pain.  GI following closely.  Likely pursue EGD.   Severe aortic stenosis - Patient follows with cardiology in the outpatient setting.  Scheduled to undergo TAVR next week 6/10.  Cardiology in house consulted.  Had talked with the patient's cardiologist Dr. Lorie Rook and the structural team. They don't think there is anything for cards to do right now, so they don't think a consult is needed. They will follow peripherally, recommend GI bleed workup as you are doing and getting scoped before TAVR. She is on the schedule for TAVR for next Tuesday, so they will see if she will be ready closer to then .   NSTEMI - Troponins elevated but flat suggestive of demand ischemia in the setting of severe anemia.  Cardiology as above.  No intervention at this time.   Acute kidney injury - Creatinine 1.71 on presentation,  now improved to 2.46 after blood resuscitation.  Will recheck BMP in AM.   Paroxysmal atrial fibrillation - Currently uses amiodarone  and metoprolol .  Holding Eliquis  for now given the patient's GI bleeding.

## 2023-12-11 NOTE — Progress Notes (Signed)
 Progress Note   Patient: Dana Sandoval VQQ:[id number] DOB: 04/27/1941 DOA: 12/10/2023  DOS: the patient was seen and examined on 12/11/2023   Brief hospital course:  83 y.o. female with medical history significant of severe aortic stenosis, hypothyroidism, hypertension, hyperlipidemia who presented to the emergency department due to progressively worsening fatigue and weakness.  Patient was unable to stand or take care of herself due to severe fatigue.  Over the last week she has had dark bloody diarrhea.  She presented to emergency department where she was found to be hypotensive and hemodynamically stable.  Labs were obtained which showed WBC 11.8, hemoglobin 4.8 baseline around 10.  Assessment and Plan:  Acute on chronic blood loss anemia - Noted dark stools prior to presentation.  Hemoglobin 4.8 on presentation.  Status post 4 units packed RBCs.  GI consulted, following closely.  Holding Eliquis .  Iron studies showing severe iron deficiency with iron 11, percent sat 4, ferritin 13.  Will hold off on IV iron supplementation as patient has already received 4 units PRBCs.  Would likely benefit from p.o. iron upon discharge.  Likely upper GI bleed - Patient endorses dark stools.  Denies any NSAIDs, alcohol use, history of previous GI bleed.  No epigastric pain.  GI following closely.  Likely pursue EGD.  Severe aortic stenosis - Patient follows with cardiology in the outpatient setting.  Scheduled to undergo TAVR next week 6/10.  Cardiology in house consulted.  Had talked with the patient's cardiologist Dr. Lorie Rook and the structural team. They don't think there is anything for cards to do right now, so they don't think a consult is needed. They will follow peripherally, recommend GI bleed workup as you are doing and getting scoped before TAVR. She is on the schedule for TAVR for next Tuesday, so they will see if she will be ready closer to then .  NSTEMI - Troponins elevated but flat  suggestive of demand ischemia in the setting of severe anemia.  Cardiology as above.  No intervention at this time.  Acute kidney injury - Creatinine 1.71 on presentation, now improved to 2.46 after blood resuscitation.  Will recheck BMP in AM.  Paroxysmal atrial fibrillation - Currently uses amiodarone  and metoprolol .  Holding Eliquis  for now given the patient's GI bleeding.      Subjective: Patient resting comfortably this morning.  Denies any fever, chills, chest pain, nausea, vomiting, abdominal pain.  Pain in her back from the uncomfortable bed but otherwise feeling improved after receiving PRBCs.  Answered questions and concerns of the patient and family at bedside.  Physical Exam:  Vitals:   12/11/23 1045 12/11/23 1100 12/11/23 1115 12/11/23 1130  BP:  (!) 120/46    Pulse: 70 70 70 71  Resp: 16 (!) 26 17 (!) 24  Temp:      TempSrc:      SpO2: 97% 97% 98% 98%    GENERAL:  Alert, pleasant, no acute distress  HEENT:  EOMI CARDIOVASCULAR:  RRR, no murmurs appreciated RESPIRATORY:  Clear to auscultation, no wheezing, rales, or rhonchi GASTROINTESTINAL:  Soft, nontender, nondistended EXTREMITIES:  No LE edema bilaterally NEURO:  No new focal deficits appreciated SKIN:  No rashes noted PSYCH:  Appropriate mood and affect     Data Reviewed:  No new imaging to review at this time  Previous records (including but not limited to H&P, progress notes, nursing notes, TOC management) were reviewed in assessment of this patient.  Labs: CBC: Recent Labs  Lab 12/10/23 1647  12/10/23 2231 12/11/23 0551  WBC 11.8* 14.2* 12.6*  HGB 4.8* 9.5* 10.4*  HCT 15.2* 27.5* 30.7*  MCV 88.9 82.8 81.6  PLT 477* 367 338   Basic Metabolic Panel: Recent Labs  Lab 12/10/23 1647 12/10/23 1805 12/11/23 0551  NA 138  --  140  K 3.1*  --  3.3*  CL 105  --  109  CO2 20*  --  23  GLUCOSE 218*  --  155*  BUN 26*  --  28*  CREATININE 1.71*  --  1.46*  CALCIUM  7.8*  --  8.0*  MG  --  1.7   --    Liver Function Tests: Recent Labs  Lab 12/10/23 1805  AST 20  ALT 14  ALKPHOS 51  BILITOT 0.7  PROT 5.3*  ALBUMIN 3.1*   CBG: No results for input(s): "GLUCAP" in the last 168 hours.  Scheduled Meds:  sodium chloride    Intravenous Once   sodium chloride    Intravenous Once   amiodarone   100 mg Oral Daily   atorvastatin   40 mg Oral Daily   levothyroxine   88 mcg Oral Q0600   metoprolol  succinate  12.5 mg Oral Daily   pantoprazole (PROTONIX) IV  40 mg Intravenous Q12H   Continuous Infusions: PRN Meds:.acetaminophen  **OR** acetaminophen , ondansetron  **OR** ondansetron  (ZOFRAN ) IV, oxyCODONE   Family Communication: Family at bedside  Disposition: Status is: Inpatient Remains inpatient appropriate because: Severe anemia GI bleed     Time spent: 34 minutes  Length of inpatient stay: 1 days  Author: Jodeane Mulligan, DO 12/11/2023 11:57 AM  For on call review [email or web address].

## 2023-12-11 NOTE — Consult Note (Signed)
 Reason for Consult: GI bleed Referring Physician: [facility]  Dana Sandoval HPI: This is an 83 year old female with a PMH of severe aortic stenosis, HTN, hyperlipidemia, and DM admitted for symptomatic anemia.  Starting two weeks ago she started to have black "jelly" colored stool.  On a daily basis she was experiencing at least one bloody bowel movement.  Over the course of time she started to experience more fatigue and SOB.  The patient was previously independent and ambulatory without any assistance, but just prior to her admission she needed to move about in a wheelchair with assistance.  Her last colonoscopy was with Dr. Tova Fresh on 12/26/2017 for a positive Cologuard.  The examination was normal at that time.  The patient has a history of severe aortic stenosis and she is scheduled for a TVAR.  The patient does use Eliquis  on a daily basis.  Since admission she is feeling much better with the 4 units of PRBC.  Her current HGB increased fro 10.4 g/dL up from 4.8 g/dL.  The patient's troponin was elevated at 453 and this was felt to be from demand ischemia.  The patient denies any issues with GERD or abdominal pain.  Past Medical History:  Diagnosis Date   Arthritis    B12 deficiency    Diabetes mellitus    type 2   Hyperlipidemia    Hypertension    Hypothyroidism    IBS (irritable bowel syndrome)    Macular degeneration    Macular degeneration of right eye    Severe aortic stenosis    Thyroid  disease    HYPOTHYROIDISM    Past Surgical History:  Procedure Laterality Date   ABDOMINAL HYSTERECTOMY  1977   TAH  (FIBROIDS)   CATARACT EXTRACTION Bilateral    DILATION AND CURETTAGE OF UTERUS     DOPPLER ECHOCARDIOGRAPHY  2009   HAD AN ECHOCARDIOGRAM THAT SHOWED MODERATE MITRAL  ANNULAR CALCIFICATION AND A NORMAL EJECTION FRACTION OF 55%   RIGHT HEART CATH AND CORONARY ANGIOGRAPHY N/A 10/24/2023   Procedure: RIGHT HEART CATH AND CORONARY ANGIOGRAPHY;  Surgeon: [name], MD;   Location: [facility];  Service: Cardiovascular;  Laterality: N/A;   SHOULDER SURGERY     RIGHT SHOULDER SURGERY   THYROIDECTOMY     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   RIGHT   TOTAL KNEE ARTHROPLASTY Left 09/10/2018   Procedure: TOTAL KNEE ARTHROPLASTY;  Surgeon: Dayne Even, MD;  Location: [facility];  Service: Orthopedics;  Laterality: Left;    Family History  Problem Relation Age of Onset   Osteoporosis Mother    Heart disease Mother    Macular degeneration Mother    Heart disease Father    Cancer Father        BRAIN TUMOR   Cancer Brother        BRAIN TUMOR   Diabetes Maternal Aunt    Diabetes Maternal Uncle    Stroke Paternal Grandfather    Colon cancer Neg Hx    Breast cancer Neg Hx    Migraines Neg Hx     Social History:  reports that she has never smoked. She has never used smokeless tobacco. She reports that she does not drink alcohol and does not use drugs.  Allergies:  Allergies  Allergen Reactions   Codeine Itching    After 3 days   Dilaudid [Hydromorphone Hcl] Itching    After 3 days    Medications: Scheduled:  sodium chloride    Intravenous Once  sodium chloride    Intravenous Once   amiodarone   100 mg Oral Daily   atorvastatin   40 mg Oral Daily   levothyroxine   88 mcg Oral Q0600   metoprolol  succinate  12.5 mg Oral Daily   pantoprazole (PROTONIX) IV  40 mg Intravenous Q12H   Continuous:  Results for orders placed or performed during the hospital encounter of 12/10/23 (from the past 24 hours)  CBC     Status: Abnormal   Collection Time: 12/10/23  4:47 PM  Result Value Ref Range   WBC 11.8 (H) 4.0 - 10.5 K/uL   RBC 1.71 (L) 3.87 - 5.11 MIL/uL   Hemoglobin 4.8 (LL) 12.0 - 15.0 g/dL   HCT 16.1 (L) 09.6 - 04.5 %   MCV 88.9 80.0 - 100.0 fL   MCH 28.1 26.0 - 34.0 pg   MCHC 31.6 30.0 - 36.0 g/dL   RDW 40.9 (H) 81.1 - 91.4 %   Platelets 477 (H) 150 - 400 K/uL   nRBC 0.0 0.0 - 0.2 %  Basic metabolic panel     Status: Abnormal   Collection  Time: 12/10/23  4:47 PM  Result Value Ref Range   Sodium 138 135 - 145 mmol/L   Potassium 3.1 (L) 3.5 - 5.1 mmol/L   Chloride 105 98 - 111 mmol/L   CO2 20 (L) 22 - 32 mmol/L   Glucose, Bld 218 (H) 70 - 99 mg/dL   BUN 26 (H) 8 - 23 mg/dL   Creatinine, Ser 7.82 (H) 0.44 - 1.00 mg/dL   Calcium  7.8 (L) 8.9 - 10.3 mg/dL   GFR, Estimated 29 (L) >60 mL/min   Anion gap 13 5 - 15  Troponin I (High Sensitivity)     Status: Abnormal   Collection Time: 12/10/23  4:47 PM  Result Value Ref Range   Troponin I (High Sensitivity) 453 (HH) <18 ng/L  Prepare RBC (crossmatch)     Status: None   Collection Time: 12/10/23  5:35 PM  Result Value Ref Range   Order Confirmation      ORDER PROCESSED BY BLOOD BANK Performed at [facility], [address] [place], [place] [address]   Type and screen [facility]     Status: None (Preliminary result)   Collection Time: 12/10/23  5:55 PM  Result Value Ref Range   ABO/RH(D) A POS    Antibody Screen NEG    Sample Expiration 12/13/2023,2359    Unit Number [id number]    Blood Component Type RED CELLS,LR    Unit division 00    Status of Unit ISSUED,FINAL    Transfusion Status OK TO TRANSFUSE    Crossmatch Result Compatible    Unit Number [id number]    Blood Component Type RED CELLS,LR    Unit division 00    Status of Unit ISSUED,FINAL    Transfusion Status OK TO TRANSFUSE    Crossmatch Result Compatible    Unit Number [id number]    Blood Component Type RED CELLS,LR    Unit division 00    Status of Unit ISSUED    Transfusion Status OK TO TRANSFUSE    Crossmatch Result Compatible    Unit Number [id number]    Blood Component Type RED CELLS,LR    Unit division 00    Status of Unit ISSUED,FINAL    Transfusion Status OK TO TRANSFUSE    Crossmatch Result COMPATIBLE   POC occult blood, ED     Status: Abnormal   Collection Time:  12/10/23  6:01 PM  Result Value Ref Range   Fecal Occult Bld POSITIVE (A) NEGATIVE  Iron  and TIBC     Status: Abnormal   Collection Time: 12/10/23  6:05 PM  Result Value Ref Range   Iron 11 (L) 28 - 170 ug/dL   TIBC 161 096 - 045 ug/dL   Saturation Ratios 4 (L) 10.4 - 31.8 %   UIBC 304 ug/dL  Ferritin     Status: None   Collection Time: 12/10/23  6:05 PM  Result Value Ref Range   Ferritin 13 11 - 307 ng/mL  Hepatic function panel     Status: Abnormal   Collection Time: 12/10/23  6:05 PM  Result Value Ref Range   Total Protein 5.3 (L) 6.5 - 8.1 g/dL   Albumin 3.1 (L) 3.5 - 5.0 g/dL   AST 20 15 - 41 U/L   ALT 14 0 - 44 U/L   Alkaline Phosphatase 51 38 - 126 U/L   Total Bilirubin 0.7 0.0 - 1.2 mg/dL   Bilirubin, Direct 0.1 0.0 - 0.2 mg/dL   Indirect Bilirubin 0.6 0.3 - 0.9 mg/dL  Magnesium     Status: None   Collection Time: 12/10/23  6:05 PM  Result Value Ref Range   Magnesium 1.7 1.7 - 2.4 mg/dL  Prepare RBC     Status: None   Collection Time: 12/10/23  6:05 PM  Result Value Ref Range   Order Confirmation      ORDER PROCESSED BY BLOOD BANK Performed at [facility], [address], [place], [place] [address]   Vitamin B12     Status: Abnormal   Collection Time: 12/10/23  6:06 PM  Result Value Ref Range   Vitamin B-12 1,735 (H) 180 - 914 pg/mL  Folate     Status: None   Collection Time: 12/10/23  6:06 PM  Result Value Ref Range   Folate 15.5 >5.9 ng/mL  Reticulocytes     Status: Abnormal   Collection Time: 12/10/23  6:06 PM  Result Value Ref Range   Retic Ct Pct 6.3 (H) 0.4 - 3.1 %   RBC. 1.67 (L) 3.87 - 5.11 MIL/uL   Retic Count, Absolute 105.2 19.0 - 186.0 K/uL   Immature Retic Fract 36.7 (H) 2.3 - 15.9 %  Protime-INR     Status: Abnormal   Collection Time: 12/10/23  6:54 PM  Result Value Ref Range   Prothrombin Time 22.4 (H) 11.4 - 15.2 seconds   INR 1.9 (H) 0.8 - 1.2  Troponin I (High Sensitivity)     Status: Abnormal   Collection Time: 12/10/23  6:55 PM  Result Value Ref Range   Troponin I (High Sensitivity) 453 (HH) <18 ng/L  CBC      Status: Abnormal   Collection Time: 12/10/23 10:31 PM  Result Value Ref Range   WBC 14.2 (H) 4.0 - 10.5 K/uL   RBC 3.32 (L) 3.87 - 5.11 MIL/uL   Hemoglobin 9.5 (L) 12.0 - 15.0 g/dL   HCT 19.1 (L) 47.8 - 29.5 %   MCV 82.8 80.0 - 100.0 fL   MCH 28.6 26.0 - 34.0 pg   MCHC 34.5 30.0 - 36.0 g/dL   RDW 62.1 (H) 30.8 - 65.7 %   Platelets 367 150 - 400 K/uL   nRBC 0.4 (H) 0.0 - 0.2 %  Basic metabolic panel     Status: Abnormal   Collection Time: 12/11/23  5:51 AM  Result Value Ref Range  Sodium 140 135 - 145 mmol/L   Potassium 3.3 (L) 3.5 - 5.1 mmol/L   Chloride 109 98 - 111 mmol/L   CO2 23 22 - 32 mmol/L   Glucose, Bld 155 (H) 70 - 99 mg/dL   BUN 28 (H) 8 - 23 mg/dL   Creatinine, Ser 1.61 (H) 0.44 - 1.00 mg/dL   Calcium  8.0 (L) 8.9 - 10.3 mg/dL   GFR, Estimated 35 (L) >60 mL/min   Anion gap 8 5 - 15  CBC     Status: Abnormal   Collection Time: 12/11/23  5:51 AM  Result Value Ref Range   WBC 12.6 (H) 4.0 - 10.5 K/uL   RBC 3.76 (L) 3.87 - 5.11 MIL/uL   Hemoglobin 10.4 (L) 12.0 - 15.0 g/dL   HCT 09.6 (L) 04.5 - 40.9 %   MCV 81.6 80.0 - 100.0 fL   MCH 27.7 26.0 - 34.0 pg   MCHC 33.9 30.0 - 36.0 g/dL   RDW 81.1 (H) 91.4 - 78.2 %   Platelets 338 150 - 400 K/uL   nRBC 0.3 (H) 0.0 - 0.2 %     CT ABDOMEN PELVIS WO CONTRAST Result Date: 12/10/2023 CLINICAL DATA:  Back pain, anemia, concern for retroperitoneal hemorrhage EXAM: CT ABDOMEN AND PELVIS WITHOUT CONTRAST TECHNIQUE: Multidetector CT imaging of the abdomen and pelvis was performed following the standard protocol without IV contrast. RADIATION DOSE REDUCTION: This exam was performed according to the departmental dose-optimization program which includes automated exposure control, adjustment of the mA and/or kV according to patient size and/or use of iterative reconstruction technique. COMPARISON:  CTA chest abdomen pelvis dated 11/22/2023 FINDINGS: Lower chest: Moderate bilateral pleural effusions with associated bilateral lower lobe  atelectasis Hepatobiliary: Liver is unremarkable. Gallbladder is unremarkable. No intrahepatic or extrahepatic ductal dilatation. 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 No pancreatic atrophy or ductal dilatation. Spleen: Within normal limits. Adrenals/Urinary Tract: Adrenal glands are within normal limits. Simple bilateral renal cysts, measuring up to 3.7 cm in the right lower kidney (image 34), benign (Bosniak I). No follow-up is recommended. 3 mm nonobstructing left upper pole renal calculus (image 21). No hydronephrosis. Bladder is underdistended but unremarkable. Stomach/Bowel: Stomach is within normal limits. No evidence of bowel obstruction. Appendix is not discretely visualized. No colonic wall thickening or inflammatory changes. Vascular/Lymphatic: Atherosclerotic calcifications of the abdominal aorta and branch vessels. No evidence of abdominal aortic aneurysm. No suspicious abdominopelvic lymphadenopathy. Reproductive: Status post hysterectomy. Bilateral ovaries are within normal limits. Other: No abdominopelvic ascites. Tiny fat containing periumbilical hernia. Musculoskeletal: No evidence of abdominal wall or retroperitoneal hematoma. Mild degenerative changes of the visualized thoracolumbar spine. IMPRESSION: No evidence of abdominal wall or retroperitoneal hematoma. 3 mm nonobstructing left upper pole renal calculus. No hydronephrosis. 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 Given the patient's age, dedicated follow-up is considered optional in 1 year. Moderate bilateral pleural effusions with associated bilateral lower lobe atelectasis. Electronically Signed   By: Zadie Herter M.D.   On: 12/10/2023 20:20   DG Chest Portable 1 View Result Date: 12/10/2023 CLINICAL DATA:  Dyspnea, pain between shoulder blades EXAM: PORTABLE  CHEST - 1 VIEW COMPARISON:  September 02, 2018 FINDINGS: Blunting of the right costophrenic sulcus. Trace left pleural effusion. Few patchy airspace opacities are also noted in both lung bases. No pneumothorax. No cardiomegaly. Aortic atherosclerosis. No acute fracture or destructive  lesion. Multilevel thoracic osteophytosis. 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 IMPRESSION: Likely layering right pleural effusion, small to moderate in volume. Trace left pleural effusion. Patchy airspace opacities in both lung bases, which may represent atelectasis or a developing bronchopneumonia, in the correct clinical context. Electronically Signed   By: Rance Burrows M.D.   On: 12/10/2023 18:58    ROS:  As stated above in the HPI otherwise negative.  Blood pressure (!) 120/46, pulse 71, temperature 98.3 F (36.8 C), temperature source Oral, resp. rate (!) 24, SpO2 98%.    PE: Gen: NAD, Alert and Oriented HEENT:  [place]/AT, EOMI Neck: Supple, no LAD Lungs: CTA Bilaterally CV: RRR without M/G/R ABD: Soft, NTND, +BS Ext: No C/C/E  Assessment/Plan: 1) GI bleed. 2) Symptomatic anemia. 3) Severe aortic stenosis.   The patient is clinically stable.  She will need further evaluation with an EGD/colonoscopy.  It is not clear about the source of her GI bleed with the "jelly"-like dark stool in the absence of diverticula.  Plan: 1) EGD/colonoscopy with Dr. Tova Fresh at 3 PM. 2) Follow HGB and transfuse if necessary. 3) PPI for now.  Chae Shuster D 12/11/2023, 12:39 PM

## 2023-12-12 ENCOUNTER — Encounter (HOSPITAL_COMMUNITY)
Admission: EM | Disposition: A | Discharge status: Home Health | Payer: Self-pay | Source: Home / Self Care | Attending: Internal Medicine

## 2023-12-12 ENCOUNTER — Inpatient Hospital Stay (HOSPITAL_COMMUNITY): Admitting: Anesthesiology

## 2023-12-12 DIAGNOSIS — D649 Anemia, unspecified: Secondary | ICD-10-CM | POA: Diagnosis not present

## 2023-12-12 DIAGNOSIS — I10 Essential (primary) hypertension: Secondary | ICD-10-CM | POA: Diagnosis not present

## 2023-12-12 DIAGNOSIS — K648 Other hemorrhoids: Secondary | ICD-10-CM | POA: Diagnosis not present

## 2023-12-12 DIAGNOSIS — E119 Type 2 diabetes mellitus without complications: Secondary | ICD-10-CM

## 2023-12-12 DIAGNOSIS — K922 Gastrointestinal hemorrhage, unspecified: Secondary | ICD-10-CM | POA: Diagnosis not present

## 2023-12-12 DIAGNOSIS — I4891 Unspecified atrial fibrillation: Secondary | ICD-10-CM | POA: Diagnosis not present

## 2023-12-12 DIAGNOSIS — R7989 Other specified abnormal findings of blood chemistry: Secondary | ICD-10-CM | POA: Diagnosis not present

## 2023-12-12 DIAGNOSIS — N179 Acute kidney failure, unspecified: Secondary | ICD-10-CM | POA: Diagnosis not present

## 2023-12-12 HISTORY — PX: COLONOSCOPY: SHX5424

## 2023-12-12 HISTORY — PX: ESOPHAGOGASTRODUODENOSCOPY: SHX5428

## 2023-12-12 HISTORY — PX: GIVENS CAPSULE STUDY: SHX5432

## 2023-12-12 LAB — BASIC METABOLIC PANEL WITH GFR
Anion gap: 7 (ref 5–15)
BUN: 20 mg/dL (ref 8–23)
CO2: 25 mmol/L (ref 22–32)
Calcium: 8.3 mg/dL — ABNORMAL LOW (ref 8.9–10.3)
Chloride: 105 mmol/L (ref 98–111)
Creatinine, Ser: 1.12 mg/dL — ABNORMAL HIGH (ref 0.44–1.00)
GFR, Estimated: 48 mL/min — ABNORMAL LOW (ref >60)
Glucose, Bld: 107 mg/dL — ABNORMAL HIGH (ref 70–99)
Potassium: 3 mmol/L — ABNORMAL LOW (ref 3.5–5.1)
Sodium: 137 mmol/L (ref 135–145)

## 2023-12-12 LAB — BPAM RBC
Blood Product Expiration Date: 202506102359
Blood Product Expiration Date: 202506272359
Blood Product Expiration Date: 202506282359
Blood Product Expiration Date: 202506282359
ISSUE DATE / TIME: 202506021811
ISSUE DATE / TIME: 202506022013
ISSUE DATE / TIME: 202506022320
ISSUE DATE / TIME: 202506030158
Unit Type and Rh: 5100
Unit Type and Rh: 6200
Unit Type and Rh: 6200
Unit Type and Rh: 6200

## 2023-12-12 LAB — CBC
HCT: 32 % — ABNORMAL LOW (ref 36.0–46.0)
Hemoglobin: 10.9 g/dL — ABNORMAL LOW (ref 12.0–15.0)
MCH: 28.2 pg (ref 26.0–34.0)
MCHC: 34.1 g/dL (ref 30.0–36.0)
MCV: 82.7 fL (ref 80.0–100.0)
Platelets: 334 10*3/uL (ref 150–400)
RBC: 3.87 MIL/uL (ref 3.87–5.11)
RDW: 16.2 % — ABNORMAL HIGH (ref 11.5–15.5)
WBC: 10.9 10*3/uL — ABNORMAL HIGH (ref 4.0–10.5)
nRBC: 0 % (ref 0.0–0.2)

## 2023-12-12 LAB — TYPE AND SCREEN
ABO/RH(D): A POS
Antibody Screen: NEGATIVE
Unit division: 0
Unit division: 0
Unit division: 0
Unit division: 0

## 2023-12-12 LAB — MAGNESIUM: Magnesium: 1.7 mg/dL (ref 1.7–2.4)

## 2023-12-12 SURGERY — EGD (ESOPHAGOGASTRODUODENOSCOPY)
Anesthesia: General

## 2023-12-12 MED ORDER — LACTATED RINGERS IV SOLN: INTRAVENOUS | Status: DC | PRN

## 2023-12-12 MED ORDER — LIDOCAINE HCL (CARDIAC) PF 100 MG/5ML IV SOSY
PREFILLED_SYRINGE | INTRAVENOUS | Status: DC | PRN
Start: 2023-12-12 — End: 2023-12-12
  Administered 2023-12-12: 80 mg via INTRATRACHEAL

## 2023-12-12 MED ORDER — POTASSIUM CHLORIDE 10 MEQ/100ML IV SOLN
10.0000 meq | INTRAVENOUS | Status: AC
  Administered 2023-12-12 (×2): 10 meq via INTRAVENOUS
  Filled 2023-12-12 (×2): qty 100

## 2023-12-12 MED ORDER — MAGNESIUM SULFATE 2 GM/50ML IV SOLN
2.0000 g | Freq: Once | INTRAVENOUS | Status: AC
  Administered 2023-12-12: 2 g via INTRAVENOUS
  Filled 2023-12-12: qty 50

## 2023-12-12 MED ORDER — PHENYLEPHRINE HCL-NACL 20-0.9 MG/250ML-% IV SOLN
INTRAVENOUS | Status: DC | PRN
Start: 2023-12-12 — End: 2023-12-12
  Administered 2023-12-12: 30 ug/min via INTRAVENOUS

## 2023-12-12 MED ORDER — PROPOFOL 10 MG/ML IV BOLUS
INTRAVENOUS | Status: DC | PRN
  Administered 2023-12-12: 50 ug/kg/min via INTRAVENOUS

## 2023-12-12 MED ORDER — POTASSIUM CHLORIDE 10 MEQ/100ML IV SOLN
10.0000 meq | INTRAVENOUS | Status: AC
  Administered 2023-12-12: 10 meq via INTRAVENOUS
  Filled 2023-12-12: qty 100

## 2023-12-12 MED ORDER — SUCCINYLCHOLINE CHLORIDE 200 MG/10ML IV SOSY
PREFILLED_SYRINGE | INTRAVENOUS | Status: DC | PRN
  Administered 2023-12-12: 60 mg via INTRAVENOUS

## 2023-12-12 MED ORDER — ETOMIDATE 2 MG/ML IV SOLN
INTRAVENOUS | Status: DC | PRN
Start: 2023-12-12 — End: 2023-12-12
  Administered 2023-12-12 (×2): 10 mg via INTRAVENOUS

## 2023-12-12 MED ORDER — EPINEPHRINE PF 1 MG/ML IJ SOLN
INTRAMUSCULAR | Status: DC | PRN
Start: 2023-12-12 — End: 2023-12-12
  Administered 2023-12-12: .02 mg via INTRAVENOUS
  Administered 2023-12-12: .15 mg via INTRAVENOUS
  Administered 2023-12-12: .01 mg via INTRAVENOUS
  Administered 2023-12-12: .03 mg via INTRAVENOUS
  Administered 2023-12-12: .005 mg via INTRAVENOUS

## 2023-12-12 NOTE — Evaluation (Signed)
 Occupational Therapy Evaluation Patient Details Name: Dana Sandoval MRN: [id number] DOB: 1941-03-27 Today's Date: 12/12/2023   History of Present Illness   83 y.o.  female who presented with fatigue/weakness/exertional dyspnea-she acknowledged black stools intermittently for the past several weeks, with history of severe aortic stenosis, PAF, HTN, mild to moderate mitral regurgitation, HLD     Clinical Impressions Pt feeling good, no complaints, resting in bed with family present. Pt lives with husband, PLOF independent, but has needed help the last 2-3 weeks, started using rollator due to weakness, transporting from chair to bathroom only. Currently Pt is feeling much better, close to baseline, supervision for safety with ADLs and mobility, no AD, able to ambulate to restroom with no LOB. Pt able to complete ADLs at bedside with set up/supervision. Pt has no acute OT needs, has all DME at home needed to remain safe/independent, no follow up OT necessary.      If plan is discharge home, recommend the following:   A little help with walking and/or transfers;A little help with bathing/dressing/bathroom;Assistance with cooking/housework     Functional Status Assessment   Patient has had a recent decline in their functional status and demonstrates the ability to make significant improvements in function in a reasonable and predictable amount of time.     Equipment Recommendations   None recommended by OT     Recommendations for Other Services         Precautions/Restrictions   Precautions Precautions: Fall Restrictions Weight Bearing Restrictions Per Provider Order: No     Mobility Bed Mobility Overal bed mobility: Modified Independent                  Transfers Overall transfer level: Needs assistance Equipment used: None Transfers: Sit to/from Stand, Bed to chair/wheelchair/BSC Sit to Stand: Supervision     Step pivot transfers: Supervision      General transfer comment: supervision for safety      Balance Overall balance assessment: Mild deficits observed, not formally tested                                         ADL either performed or assessed with clinical judgement   ADL Overall ADL's : At baseline;Needs assistance/impaired                                       General ADL Comments: supervision for safety     Vision Baseline Vision/History: 6 Macular Degeneration Ability to See in Adequate Light: 1 Impaired Patient Visual Report: No change from baseline       Perception         Praxis         Pertinent Vitals/Pain Pain Assessment Pain Assessment: No/denies pain     Extremity/Trunk Assessment Upper Extremity Assessment Upper Extremity Assessment: RUE deficits/detail RUE Deficits / Details: history of RTC tear, reports needed shoulder surgery but did not get it done RUE: Shoulder pain with ROM RUE Sensation: WNL RUE Coordination: WNL           Communication Communication Communication: No apparent difficulties   Cognition Arousal: Alert Behavior During Therapy: WFL for tasks assessed/performed Cognition: No apparent impairments  Following commands: Intact       Cueing  General Comments   Cueing Techniques: Verbal cues      Exercises     Shoulder Instructions      Home Living Family/patient expects to be discharged to:: Private residence Living Arrangements: Spouse/significant other Available Help at Discharge: Family;Available 24 hours/day Type of Home: House Home Access: Stairs to enter [employer] of Steps: 2 Entrance Stairs-Rails: Right Home Layout: Two level;Able to live on main level with bedroom/bathroom     Bathroom Shower/Tub: Walk-in shower         Home Equipment: Educational psychologist (4 wheels);Rolling Walker (2 wheels);Wheelchair - manual   Additional Comments: Pt  lives at home with husband who is available 24/7, other family is close by      Prior Functioning/Environment Prior Level of Function : Needs assist             Mobility Comments: has only been going to bathroom/recliner/bed over the last few weeks due to SOB/fatigue, has been using rollator for a week. no falls ADLs Comments: Typically independent but over the last week has needed help    OT Problem List: Decreased activity tolerance;Decreased strength   OT Treatment/Interventions:        OT Goals(Current goals can be found in the care plan section)   Acute Rehab OT Goals Patient Stated Goal: to improve activity tolerance OT Goal Formulation: With patient/family Time For Goal Achievement: 12/26/23 Potential to Achieve Goals: Good   OT Frequency:       Co-evaluation              AM-PAC OT "6 Clicks" Daily Activity     Outcome Measure Help from another person eating meals?: None Help from another person taking care of personal grooming?: None Help from another person toileting, which includes using toliet, bedpan, or urinal?: A Little Help from another person bathing (including washing, rinsing, drying)?: A Little Help from another person to put on and taking off regular upper body clothing?: A Little Help from another person to put on and taking off regular lower body clothing?: None 6 Click Score: 21   End of Session Equipment Utilized During Treatment: Gait belt Nurse Communication: Mobility status  Activity Tolerance: Patient tolerated treatment well Patient left: in chair;with call bell/phone within reach;with nursing/sitter in room;with family/visitor present;Other (comment) (left in transport chair to go to medical procedure)  OT Visit Diagnosis: Muscle weakness (generalized) (M62.81);Other (comment) (decreased activity tolerance)                Time: 8295-6213 OT Time Calculation (min): 18 min Charges:  OT General Charges $OT Visit: 1 Visit OT  Evaluation $OT Eval Low Complexity: 1 Low  [address] OTR/L   Scherry Curtis 12/12/2023, 2:26 PM

## 2023-12-12 NOTE — Transfer of Care (Signed)
 Immediate Anesthesia Transfer of Care Note  Patient: Dana Sandoval  Procedure(s) Performed: EGD (ESOPHAGOGASTRODUODENOSCOPY) COLONOSCOPY  Patient Location: PACU  Anesthesia Type:General  Level of Consciousness: awake, alert , and patient cooperative  Airway & Oxygen Therapy: Patient Spontanous Breathing and Patient connected to nasal cannula oxygen  Post-op Assessment: Report given to RN and Post -op Vital signs reviewed and stable  Post vital signs: Reviewed and stable  Last Vitals:  Vitals Value Taken Time  BP 97/50 12/12/23 1620  Temp 98 12/12/23 1620  Pulse 71 12/12/23 1620  Resp 18 12/12/23 1620  SpO2 97 % 12/12/23 1620  Vitals shown include unfiled device data.  Last Pain:  Vitals:   12/12/23 1440  TempSrc: Temporal  PainSc: 0-No pain         Complications: No notable events documented.

## 2023-12-12 NOTE — Progress Notes (Addendum)
 PROGRESS NOTE        PATIENT DETAILS Name: Dana Sandoval Age: 83 y.o. Sex: female Date of Birth: 1940-09-17 Admit Date: 12/10/2023 Admitting Physician Myrl Askew, MD WUJ:[id number], Gwynda Leriche, MD  Brief Summary: Patient is a 83 y.o.  female with history of severe aortic stenosis, PAF, HTN, mild to moderate mitral regurgitation, HLD who presented with fatigue/weakness/exertional dyspnea-she acknowledged black stools intermittently for the past several weeks-she was found to have a hemoglobin of 4.8 and subsequently admitted to the hospitalist service.   Significant events: 6/2>> admit to [facility]  Significant studies: 6/2>> CT abdomen/pelvis: No retroperitoneal hematoma.  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  Significant microbiology data:   Procedures:   Consults: GI  Subjective: Lying comfortably in bed-denies any chest pain or shortness of breath.  Clear liquid water  appearing stools with colonoscopy prep this morning.  Objective: Vitals: Blood pressure 130/66, pulse 66, temperature 97.8 F (36.6 C), temperature source Oral, resp. rate 16, SpO2 97%.   Exam: Gen Exam:Alert awake-not in any distress HEENT:atraumatic, normocephalic Chest: B/L clear to auscultation anteriorly CVS:S1S2 regular Abdomen:soft non tender, non distended Extremities:no edema Neurology: Non focal Skin: no rash  Pertinent Labs/Radiology:    Latest Ref Rng & Units 12/12/2023    7:44 AM 12/11/2023   10:03 PM 12/11/2023    2:25 PM  CBC  WBC 4.0 - 10.5 K/uL 10.9  12.0  13.0   Hemoglobin 12.0 - 15.0 g/dL 78.2  95.6  21.3   Hematocrit 36.0 - 46.0 % 32.0  32.5  33.9   Platelets 150 - 400 K/uL 334  362  364     Lab Results  Component Value Date   NA 137 12/12/2023   K 3.0 (L) 12/12/2023   CL 105 12/12/2023   CO2 25 12/12/2023    Assessment/Plan: UGI bleed with ABLA No further melanotic stools-clear watery stools with colonoscopy prep this  morning Hb stable-after 3 units of PRBC transfusion. GI following with plans for EGD/colonoscopy later today Trend CBC periodically.  AKI Hemodynamically mediated due to upper GI bleeding Improved with PRBC/IVF-back to baseline.  Hypokalemia Replete/recheck  Elevated troponin Likely demand ischemia/type II non-STEMI Per prior documentation-no intervention recommended by cardiology.  Severe aortic stenosis Undergoing TAVR evaluation-tentatively scheduled for 6/10. Per prior MD documentation-continue EGD/colonoscopy planned-no specific cardiology workup recommended.  PAF Telemetry monitoring Metoprolol /amiodarone  Eliquis  on hold due to UGI bleed.  HLD Statin  Hypothyroidism Synthroid  Recent TSH 4/1-stable.  Pancreatic cyst-uncinate process Incidental finding Per radiology-better visualized on prior imaging Recommendations from radiology are to repeat dedicated follow-up in 1 year.  Code status:   Code Status: Full Code   DVT Prophylaxis: SCDs Start: 12/10/23 2222   Family Communication: Spouse/granddaughter at bedside   Disposition Plan: Status is: Inpatient Remains inpatient appropriate because: Severity of illness   Planned Discharge Destination:Home   Diet: Diet Order             Diet NPO time specified  Diet effective now                     Antimicrobial agents: Anti-infectives (From admission, onward)    None        MEDICATIONS: Scheduled Meds:  amiodarone   100 mg Oral Daily   atorvastatin   40 mg Oral Daily   levothyroxine   88 mcg Oral Q0600  metoprolol  succinate  12.5 mg Oral Daily   pantoprazole (PROTONIX) IV  40 mg Intravenous Q12H   Continuous Infusions:  sodium chloride      PRN Meds:.acetaminophen  **OR** acetaminophen , ondansetron  **OR** ondansetron  (ZOFRAN ) IV, oxyCODONE    I have personally reviewed following labs and imaging studies  LABORATORY DATA: CBC: Recent Labs  Lab 12/10/23 2231 12/11/23 0551  12/11/23 1425 12/11/23 2203 12/12/23 0744  WBC 14.2* 12.6* 13.0* 12.0* 10.9*  HGB 9.5* 10.4* 11.6* 11.2* 10.9*  HCT 27.5* 30.7* 33.9* 32.5* 32.0*  MCV 82.8 81.6 80.7 82.1 82.7  PLT 367 338 364 362 334    Basic Metabolic Panel: Recent Labs  Lab 12/10/23 1647 12/10/23 1805 12/11/23 0551 12/12/23 0744  NA 138  --  140 137  K 3.1*  --  3.3* 3.0*  CL 105  --  109 105  CO2 20*  --  23 25  GLUCOSE 218*  --  155* 107*  BUN 26*  --  28* 20  CREATININE 1.71*  --  1.46* 1.12*  CALCIUM  7.8*  --  8.0* 8.3*  MG  --  1.7  --  1.7    GFR: Estimated Creatinine Clearance: 35.3 mL/min (A) (by C-G formula based on SCr of 1.12 mg/dL (H)).  Liver Function Tests: Recent Labs  Lab 12/10/23 1805  AST 20  ALT 14  ALKPHOS 51  BILITOT 0.7  PROT 5.3*  ALBUMIN 3.1*   No results for input(s): "LIPASE", "AMYLASE" in the last 168 hours. No results for input(s): "AMMONIA" in the last 168 hours.  Coagulation Profile: Recent Labs  Lab 12/10/23 1854  INR 1.9*    Cardiac Enzymes: No results for input(s): "CKTOTAL", "CKMB", "CKMBINDEX", "TROPONINI" in the last 168 hours.  BNP (last 3 results) No results for input(s): "PROBNP" in the last 8760 hours.  Lipid Profile: No results for input(s): "CHOL", "HDL", "LDLCALC", "TRIG", "CHOLHDL", "LDLDIRECT" in the last 72 hours.  Thyroid  Function Tests: No results for input(s): "TSH", "T4TOTAL", "FREET4", "T3FREE", "THYROIDAB" in the last 72 hours.  Anemia Panel: Recent Labs    12/10/23 1805 12/10/23 1806  VITAMINB12  --  1,735*  FOLATE  --  15.5  FERRITIN 13  --   TIBC 315  --   IRON 11*  --   RETICCTPCT  --  6.3*    Urine analysis:    Component Value Date/Time   COLORURINE YELLOW 09/02/2018 0850   APPEARANCEUR CLEAR 09/02/2018 0850   LABSPEC 1.011 09/02/2018 0850   PHURINE 7.0 09/02/2018 0850   GLUCOSEU NEGATIVE 09/02/2018 0850   HGBUR NEGATIVE 09/02/2018 0850   BILIRUBINUR NEGATIVE 09/02/2018 0850   KETONESUR NEGATIVE  09/02/2018 0850   PROTEINUR NEGATIVE 09/02/2018 0850   NITRITE NEGATIVE 09/02/2018 0850   LEUKOCYTESUR MODERATE (A) 09/02/2018 0850    Sepsis Labs: Lactic Acid, Venous No results found for: "LATICACIDVEN"  MICROBIOLOGY: No results found for this or any previous visit (from the past 240 hours).  RADIOLOGY STUDIES/RESULTS: CT ABDOMEN PELVIS WO CONTRAST Result Date: 12/10/2023 CLINICAL DATA:  Back pain, anemia, concern for retroperitoneal hemorrhage EXAM: CT ABDOMEN AND PELVIS WITHOUT CONTRAST TECHNIQUE: Multidetector CT imaging of the abdomen and pelvis was performed following the standard protocol without IV contrast. RADIATION DOSE REDUCTION: This exam was performed according to the departmental dose-optimization program which includes automated exposure control, adjustment of the mA and/or kV according to patient size and/or use of iterative reconstruction technique. COMPARISON:  CTA chest abdomen pelvis dated 11/22/2023 FINDINGS: Lower chest: Moderate bilateral pleural effusions with associated  bilateral lower lobe atelectasis Hepatobiliary: Liver is unremarkable. Gallbladder is unremarkable. No intrahepatic or extrahepatic ductal dilatation. 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 No pancreatic atrophy or ductal dilatation. Spleen: Within normal limits. Adrenals/Urinary Tract: Adrenal glands are within normal limits. Simple bilateral renal cysts, measuring up to 3.7 cm in the right lower kidney (image 34), benign (Bosniak I). No follow-up is recommended. 3 mm nonobstructing left upper pole renal calculus (image 21). No hydronephrosis. Bladder is underdistended but unremarkable. Stomach/Bowel: Stomach is within normal limits. No evidence of bowel obstruction. Appendix is not discretely visualized. No colonic wall thickening or inflammatory changes. Vascular/Lymphatic: Atherosclerotic  calcifications of the abdominal aorta and branch vessels. No evidence of abdominal aortic aneurysm. No suspicious abdominopelvic lymphadenopathy. Reproductive: Status post hysterectomy. Bilateral ovaries are within normal limits. Other: No abdominopelvic ascites. Tiny fat containing periumbilical hernia. Musculoskeletal: No evidence of abdominal wall or retroperitoneal hematoma. Mild degenerative changes of the visualized thoracolumbar spine. IMPRESSION: No evidence of abdominal wall or retroperitoneal hematoma. 3 mm nonobstructing left upper pole renal calculus. No hydronephrosis. 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 Given the patient's age, dedicated follow-up is considered optional in 1 year. Moderate bilateral pleural effusions with associated bilateral lower lobe atelectasis. Electronically Signed   By: Zadie Herter M.D.   On: 12/10/2023 20:20   DG Chest Portable 1 View Result Date: 12/10/2023 CLINICAL DATA:  Dyspnea, pain between shoulder blades EXAM: PORTABLE CHEST - 1 VIEW COMPARISON:  September 02, 2018 FINDINGS: Blunting of the right costophrenic sulcus. Trace left pleural effusion. Few patchy airspace opacities are also noted in both lung bases. No pneumothorax. No cardiomegaly. Aortic atherosclerosis. No acute fracture or destructive lesion. Multilevel thoracic osteophytosis. 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 IMPRESSION: Likely layering right pleural effusion, small to moderate in volume. Trace left pleural effusion. Patchy airspace opacities in both lung bases, which may represent atelectasis or a developing bronchopneumonia, in the correct clinical context. Electronically Signed   By: Rance Burrows M.D.   On: 12/10/2023 18:58     LOS: 2 days   Kimberly Penna, MD  [facility]    To contact the attending provider between 7A-7P or the  covering provider during after hours 7P-7A, please log into the web site [email or web address] and access using universal [place] password for that web site. If you do not have the password, please call the hospital operator.  12/12/2023, 11:20 AM

## 2023-12-12 NOTE — Anesthesia Preprocedure Evaluation (Signed)
 Anesthesia Evaluation  Patient identified by MRN, date of birth, ID band Patient awake    Reviewed: Allergy & Precautions, Patient's Chart, lab work & pertinent test results  History of Anesthesia Complications Negative for: history of anesthetic complications  Airway Mallampati: III       Dental  (+) Dental Advisory Given   Pulmonary neg sleep apnea, neg COPD, neg recent URI   breath sounds clear to auscultation       Cardiovascular hypertension, Pt. on medications (-) angina (-) CAD + dysrhythmias + Valvular Problems/Murmurs AS  Rhythm:Regular + Systolic murmurs  1. Left ventricular ejection fraction, by estimation, is 60 to 65%. The  left ventricle has normal function. The left ventricle has no regional  wall motion abnormalities. There is mild left ventricular hypertrophy of  the basal-septal segment. Left  ventricular diastolic parameters are indeterminate. Elevated left  ventricular end-diastolic pressure. The average left ventricular global  longitudinal strain is -16.0 %. The global longitudinal strain is  abnormal.   2. Right ventricular systolic function is normal. The right ventricular  size is normal. There is moderately elevated pulmonary artery systolic  pressure. The estimated right ventricular systolic pressure is 51.4 mmHg.   3. The mitral valve is degenerative. Mild to moderate mitral valve  regurgitation. Mild mitral stenosis. The mean mitral valve gradient is 4.5  mmHg. Severe mitral annular calcification.   4. The aortic valve is calcified. There is severe calcifcation of the  aortic valve. There is severe thickening of the aortic valve. Aortic valve  regurgitation is trivial. Severe aortic valve stenosis. Aortic valve area,  by VTI measures 0.41 cm. Aortic  valve mean gradient measures 48.0 mmHg. Aortic valve Vmax measures 4.41  m/s.   5. The inferior vena cava is normal in size with greater than 50%   respiratory variability, suggesting right atrial pressure of 3 mmHg.   6. Compared to study dated 09/29/21, the mean AVG has increased from 25 to  , VMax has increased from 3.42 to 4.62m/s and DVI has decreased from  0.3 to 0.18.     Neuro/Psych TIA Neuromuscular disease    GI/Hepatic Neg liver ROS,,,  Endo/Other  diabetesHypothyroidism    Renal/GU CRFRenal diseaseLab Results      Component                Value               Date                      NA                       137                 12/12/2023                K                        3.0 (L)             12/12/2023                CO2                      25                  12/12/2023  GLUCOSE                  107 (H)             12/12/2023                BUN                      20                  12/12/2023                CREATININE               1.12 (H)            12/12/2023                CALCIUM                   8.3 (L)             12/12/2023                GFR                      65.68               10/09/2023                EGFR                     46 (L)              11/22/2023                GFRNONAA                 48 (L)              12/12/2023                Musculoskeletal   Abdominal   Peds  Hematology  (+) Blood dyscrasia, anemia Lab Results      Component                Value               Date                      WBC                      10.9 (H)            12/12/2023                HGB                      10.9 (L)            12/12/2023                HCT                      32.0 (L)            12/12/2023                MCV                      82.7  12/12/2023                PLT                      334                 12/12/2023              Anesthesia Other Findings   Reproductive/Obstetrics                              Anesthesia Physical Anesthesia Plan  ASA: 4  Anesthesia Plan: General   Post-op Pain Management:  Minimal or no pain anticipated   Induction: Intravenous  PONV Risk Score and Plan: 3 and TIVA, Propofol  infusion and Ondansetron   Airway Management Planned: Oral ETT  Additional Equipment: ClearSight  Intra-op Plan:   Post-operative Plan: Extubation in OR  Informed Consent: I have reviewed the patients History and Physical, chart, labs and discussed the procedure including the risks, benefits and alternatives for the proposed anesthesia with the patient or authorized representative who has indicated his/her understanding and acceptance.     Dental advisory given  Plan Discussed with: CRNA  Anesthesia Plan Comments:          Anesthesia Quick Evaluation

## 2023-12-12 NOTE — Interval H&P Note (Signed)
 History and Physical Interval Note:  12/12/2023 2:58 PM  Dana Sandoval  has presented today for surgery, with the diagnosis of GI bleed.  The various methods of treatment have been discussed with the patient and family. After consideration of risks, benefits and other options for treatment, the patient has consented to  Procedure(s): EGD (ESOPHAGOGASTRODUODENOSCOPY) (N/A) COLONOSCOPY (N/A) as a surgical intervention.  The patient's history has been reviewed, patient examined, no change in status, stable for surgery.  I have reviewed the patient's chart and labs.  Questions were answered to the patient's satisfaction.     Tami Falcon

## 2023-12-12 NOTE — Progress Notes (Signed)
 Patient swallowed PillCam today at 5:15pm and will remain NPO for 2 hours.  At 7:15 pm, patient can clear liquids. At 9:15 pm, patient can have a light snack. At 1:15 am tomorrow, patient can resume previously ordered diet. At 5:15 am tomorrow, the leads can be removed and placed in the patient belonging bag with the monitor. An endoscopy team member will pick up the bag in the morning.  Instructions given to patient and RN

## 2023-12-12 NOTE — Op Note (Addendum)
 [facility] Patient Name: Dana Sandoval Procedure Date : 12/12/2023 MRN: [id number] Attending MD: Tami Falcon , MD, [id number] Date of Birth: [birth year] CSN: [id number] Age: 83 Admit Type: Inpatient Procedure:                Diagnostic EGD. Indications:              Acute post hemorrhagic anemia, Hematochezia Providers:                Tami Falcon, MD, Bradley Caffey, Judith Novak,                            Technician, Bernida Brink, CRNA, Arvie Latus, MD Referring MD:             Gwynda Leriche. Vallarie Gauze, MD Medicines:                Monitored Anesthesia Care Complications:            No immediate complications. Estimated Blood Loss:     Estimated blood loss: none. Procedure:                Pre-Anesthesia Assessment: - Prior to the                            procedure, a history and physical was performed,                            and patient medications and allergies were                            reviewed. The patient's tolerance of previous                            anesthesia was also reviewed. The risks and                            benefits of the procedure and the sedation options                            and risks were discussed with the patient. All                            questions were answered, and informed consent was                            obtained. Prior Anticoagulants: The patient has                            taken Eliquis  (apixaban ), last dose was 1 day prior                            to procedure. ASA Grade Assessment: IV - A patient  with severe systemic disease that is a constant                            threat to life. After reviewing the risks and                            benefits, the patient was deemed in satisfactory                            condition to undergo the procedure. After obtaining                            informed consent, the endoscope was passed under                             direct vision. Throughout the procedure, the                            patient's blood pressure, pulse, and oxygen                            saturations were monitored continuously. The                            [id number]-[id number] ([id number]) Olympus endoscope was introduced                            through the mouth, and advanced to the second part                            of duodenum. The EGD was accomplished without                            difficulty. The patient tolerated the procedure                            well. Scope In: Scope Out: Findings:      The examined esophagus and GEJ appeared widely patent and normal.      The entire examined stomach was normal.      The examined duodenum was normal.      The cardia and gastric fundus were normal on retroflexion.      No fresh or old heme noted on exam; no source of bleeding identified. Impression:               - Normal appearing, widely patent esophagus and GEJ.                           - Normal stomach.                           - Normal examined duodenum.                           - No specimens collected. Moderate Sedation:  MAC used. Recommendation:           - Clear liquid diet today.                           - To visualize the small bowel, perform video                            capsule endoscopy today. Procedure Code(s):        --- Professional ---                           [phone], Esophagogastroduodenoscopy, flexible,                            transoral; diagnostic, including collection of                            specimen(s) by brushing or washing, when performed                            (separate procedure) Diagnosis Code(s):        --- Professional ---                           K92.1, Melena (includes Hematochezia)                           D62, Acute posthemorrhagic anemia CPT copyright 2022 [employer] Association. All rights reserved. The codes documented in this report  are preliminary and upon coder review may  be revised to meet current compliance requirements. Tami Falcon, MD Tami Falcon, MD 12/12/2023 4:19:15 PM This report has been signed electronically. Number of Addenda: 0

## 2023-12-12 NOTE — Op Note (Signed)
 [facility] Patient Name: Dana Sandoval Procedure Date : 12/12/2023 MRN: [id number] Attending MD: Tami Falcon , MD, [id number] Date of Birth: June 22, 1941 CSN: [id number] Age: 83 Admit Type: Inpatient Procedure:                Diagnostic colonoscopy. Indications:              Hematochezia, Acute post hemorrhagic anemia, CRC                            screening-severe aortic stenosis Providers:                Tami Falcon, MD, Bradley Caffey, Judith Novak,                            Technician, Bernida Brink, CRNA, Arvie Latus, MD Referring MD:             Gwynda Leriche. Vallarie Gauze, MD Medicines:                Monitored Anesthesia Care Complications:            No immediate complications. Estimated Blood Loss:     Estimated blood loss: none. Procedure:                Pre-Anesthesia Assessment: - Prior to the                            procedure, a history and physical was performed,                            and patient medications and allergies were                            reviewed. The patient's tolerance of previous                            anesthesia was also reviewed. The risks and                            benefits of the procedure and the sedation options                            and risks were discussed with the patient. All                            questions were answered, and informed consent was                            obtained. Prior Anticoagulants: The patient has                            taken Eliquis  (apixaban ), last dose was 1 day prior  to procedure. ASA Grade Assessment: IV - A patient                            with severe systemic disease that is a constant                            threat to life. After reviewing the risks and                            benefits, the patient was deemed in satisfactory                            condition to undergo the procedure. After obtaining                             informed consent, the colonoscope was passed under                            direct vision. Throughout the procedure, the                            patient's blood pressure, pulse, and oxygen                            saturations were monitored continuously. The                            CF-HQ190L ([id number] Olympus coloscope was                            introduced through the anus and advanced to the the                            cecum, identified by appendiceal orifice and                            ileocecal valve. The colonoscopy was performed with                            moderate difficulty due to inadequate bowel prep.                            Successful completion of the procedure was aided by                            lavage. The patient tolerated the procedure well.                            The quality of the bowel preparation was adequate.                            The ileocecal valve, the appendiceal orifice and  the rectum were photographed. The quality of the                            bowel preparation was evaluated using the BBPS                            [facility] Bowel Preparation Scale) with scores of:                            Right Colon = 2 (minor amount of residual staining,                            small fragments of stool and/or opaque liquid, but                            mucosa seen well), Transverse Colon = 3 (entire                            mucosa seen well with no residual staining, small                            fragments of stool or opaque liquid) and Left Colon                            = 3 (entire mucosa seen well with no residual                            staining, small fragments of stool or opaque                            liquid). The total BBPS score equals 8. The quality                            of the bowel preparation was good. Scope In: 3:52:36 PM Scope Out: 4:03:55  PM Scope Withdrawal Time: 0 hours 6 minutes 0 seconds  Total Procedure Duration: 0 hours 11 minutes 19 seconds  Findings:      The perianal exam revealed poor sphincter tone.      The colon (entire examined portion) appeared normal.      Prominent Internal hemorrhoids. Impression:               - Abnormal perianal exam.                           - The entire examined colon is normal.                           - No specimens collected. Recommendation:           - Clear liquid diet today.                           - Continue present medications.                           -  To visualize the small bowel, perform video                            capsule endoscopy. Procedure Code(s):        --- Professional ---                           [phone], Colonoscopy, flexible; diagnostic, including                            collection of specimen(s) by brushing or washing,                            when performed (separate procedure) Diagnosis Code(s):        --- Professional ---                           K92.1, Melena (includes Hematochezia)                           D62, Acute posthemorrhagic anemia CPT copyright 2022 American Medical Association. All rights reserved. The codes documented in this report are preliminary and upon coder review may  be revised to meet current compliance requirements. Tami Falcon, MD Tami Falcon, MD 12/12/2023 4:25:51 PM This report has been signed electronically. Number of Addenda: 0

## 2023-12-12 NOTE — Anesthesia Procedure Notes (Signed)
 Procedure Name: Intubation Date/Time: 12/12/2023 3:39 PM  Performed by: Alisia Defrancesco, CRNAPre-anesthesia Checklist: Patient identified, Emergency Drugs available, Suction available and Patient being monitored Patient Re-evaluated:Patient Re-evaluated prior to induction Oxygen Delivery Method: Circle system utilized Preoxygenation: Pre-oxygenation with 100% oxygen Induction Type: IV induction Ventilation: Mask ventilation without difficulty Laryngoscope Size: Mac and 3 Grade View: Grade I Tube type: Oral Tube size: 7.0 mm Number of attempts: 1 Airway Equipment and Method: Stylet Placement Confirmation: ETT inserted through vocal cords under direct vision, positive ETCO2 and breath sounds checked- equal and bilateral Secured at: 22 cm Tube secured with: Tape Dental Injury: Teeth and Oropharynx as per pre-operative assessment and Injury to lip  Comments: intubated by C. Jahkai Yandell, CRNA; ebbs; small cut to lower lip post intubation

## 2023-12-13 ENCOUNTER — Encounter (HOSPITAL_COMMUNITY): Payer: Self-pay | Admitting: Gastroenterology

## 2023-12-13 DIAGNOSIS — K922 Gastrointestinal hemorrhage, unspecified: Secondary | ICD-10-CM | POA: Diagnosis not present

## 2023-12-13 DIAGNOSIS — D649 Anemia, unspecified: Secondary | ICD-10-CM | POA: Diagnosis not present

## 2023-12-13 DIAGNOSIS — N179 Acute kidney failure, unspecified: Secondary | ICD-10-CM | POA: Diagnosis not present

## 2023-12-13 DIAGNOSIS — I4891 Unspecified atrial fibrillation: Secondary | ICD-10-CM | POA: Diagnosis not present

## 2023-12-13 LAB — CBC
HCT: 29.7 % — ABNORMAL LOW (ref 36.0–46.0)
Hemoglobin: 9.7 g/dL — ABNORMAL LOW (ref 12.0–15.0)
MCH: 27.4 pg (ref 26.0–34.0)
MCHC: 32.7 g/dL (ref 30.0–36.0)
MCV: 83.9 fL (ref 80.0–100.0)
Platelets: 311 10*3/uL (ref 150–400)
RBC: 3.54 MIL/uL — ABNORMAL LOW (ref 3.87–5.11)
RDW: 16 % — ABNORMAL HIGH (ref 11.5–15.5)
WBC: 8.9 10*3/uL (ref 4.0–10.5)
nRBC: 0 % (ref 0.0–0.2)

## 2023-12-13 LAB — BASIC METABOLIC PANEL WITH GFR
Anion gap: 8 (ref 5–15)
BUN: 16 mg/dL (ref 8–23)
CO2: 22 mmol/L (ref 22–32)
Calcium: 7.8 mg/dL — ABNORMAL LOW (ref 8.9–10.3)
Chloride: 107 mmol/L (ref 98–111)
Creatinine, Ser: 1.23 mg/dL — ABNORMAL HIGH (ref 0.44–1.00)
GFR, Estimated: 43 mL/min — ABNORMAL LOW (ref >60)
Glucose, Bld: 98 mg/dL (ref 70–99)
Potassium: 3.4 mmol/L — ABNORMAL LOW (ref 3.5–5.1)
Sodium: 137 mmol/L (ref 135–145)

## 2023-12-13 LAB — MAGNESIUM: Magnesium: 1.8 mg/dL (ref 1.7–2.4)

## 2023-12-13 MED ORDER — FERROUS SULFATE 325 (65 FE) MG PO TABS
325.0000 mg | ORAL_TABLET | Freq: Two times a day (BID) | ORAL | Status: DC
  Administered 2023-12-13 – 2023-12-14 (×2): 325 mg via ORAL
  Filled 2023-12-13 (×2): qty 1

## 2023-12-13 MED ORDER — ROPINIROLE HCL 0.25 MG PO TABS
0.2500 mg | ORAL_TABLET | Freq: Every day | ORAL | Status: DC
  Administered 2023-12-13: 0.25 mg via ORAL
  Filled 2023-12-13: qty 1

## 2023-12-13 MED ORDER — DIPHENHYDRAMINE HCL 25 MG PO CAPS
25.0000 mg | ORAL_CAPSULE | Freq: Once | ORAL | Status: DC | PRN
  Filled 2023-12-13: qty 1

## 2023-12-13 MED ORDER — POTASSIUM CHLORIDE CRYS ER 20 MEQ PO TBCR
40.0000 meq | EXTENDED_RELEASE_TABLET | Freq: Once | ORAL | Status: AC
  Administered 2023-12-13: 40 meq via ORAL
  Filled 2023-12-13: qty 2

## 2023-12-13 MED ORDER — APIXABAN 5 MG PO TABS
5.0000 mg | ORAL_TABLET | Freq: Two times a day (BID) | ORAL | Status: DC
  Administered 2023-12-13 – 2023-12-14 (×2): 5 mg via ORAL
  Filled 2023-12-13 (×2): qty 1

## 2023-12-13 NOTE — Progress Notes (Signed)
 PHARMACY - ANTICOAGULATION CONSULT NOTE  Pharmacy Consult for eliquis  Indication: atrial fibrillation  Allergies  Allergen Reactions   Codeine Itching    After 3 days   Dilaudid [Hydromorphone Hcl] Itching    After 3 days    Patient Measurements: Height: 5\' 2"  (157.5 cm) Weight: 74.3 kg (163 lb 14.4 oz) IBW/kg (Calculated) : 50.1 HEPARIN  DW (KG): 66.1  Vital Signs: Temp: 98.2 F (36.8 C) (06/05 1255) Temp Source: Oral (06/05 1255) BP: 94/46 (06/05 1255) Pulse Rate: 62 (06/05 1255)  Labs: Recent Labs    12/10/23 1647 12/10/23 1854 12/10/23 1855 12/10/23 2231 12/11/23 0551 12/11/23 1425 12/11/23 2203 12/12/23 0744 12/13/23 0439  HGB 4.8*  --   --    < > 10.4*   < > 11.2* 10.9* 9.7*  HCT 15.2*  --   --    < > 30.7*   < > 32.5* 32.0* 29.7*  PLT 477*  --   --    < > 338   < > 362 334 311  LABPROT  --  22.4*  --   --   --   --   --   --   --   INR  --  1.9*  --   --   --   --   --   --   --   CREATININE 1.71*  --   --   --  1.46*  --   --  1.12* 1.23*  TROPONINIHS 453*  --  453*  --   --   --   --   --   --    < > = values in this interval not displayed.    Estimated Creatinine Clearance: 32.1 mL/min (A) (by C-G formula based on SCr of 1.23 mg/dL (H)).   Medical History: Past Medical History:  Diagnosis Date   Arthritis    B12 deficiency    Diabetes mellitus    type 2   Hyperlipidemia    Hypertension    Hypothyroidism    IBS (irritable bowel syndrome)    Macular degeneration    Macular degeneration of right eye    Severe aortic stenosis    Thyroid  disease    HYPOTHYROIDISM     Assessment: 58 yoF with non valvular atrial fibrillation on Eliquis  prior to admission which has been held for GI bleed. The provider has discussed with GI and the patient is clear to start anticoagulation now. Will resume home Eliquis .   Plan:  Eliquis  5mg  BID. Monitor CBC and for s/sx of bleeding  Angelo Kennedy Saranda Legrande 12/13/2023,4:00 PM

## 2023-12-13 NOTE — Evaluation (Signed)
 Physical Therapy Evaluation Patient Details Name: Dana Sandoval MRN: [id number] DOB: June 13, 1941 Today's Date: 12/13/2023  History of Present Illness  83 y.o.  female who presented 6/2 with fatigue/weakness/exertional dyspnea-she acknowledged black stools intermittently for the past several weeks, with history of severe aortic stenosis, PAF, HTN, mild to moderate mitral regurgitation, HLD  Clinical Impression  Pt admitted with above diagnosis. States returning close to baseline and is gradually improving. Still complains of SOB but SpO2 maintained 95% and greater on RA during session. Able to ambulate 64' with rollator at a supervision level. Has 24/7 assist available.  Educated on safe use, and energy conservation. Eager to return home, agreeable to HHPT follow-up. Has several procedures planned next week which may hinder HHPT scheduling. Pt currently with functional limitations due to the deficits listed below (see PT Problem List). Pt will benefit from acute skilled PT to increase their independence and safety with mobility to allow discharge.           If plan is discharge home, recommend the following: A little help with walking and/or transfers;A little help with bathing/dressing/bathroom;Assistance with cooking/housework;Assist for transportation;Help with stairs or ramp for entrance   Can travel by private vehicle        Equipment Recommendations None recommended by PT  Recommendations for Other Services       Functional Status Assessment Patient has had a recent decline in their functional status and demonstrates the ability to make significant improvements in function in a reasonable and predictable amount of time.     Precautions / Restrictions Precautions Precautions: Fall Recall of Precautions/Restrictions: Intact Restrictions Weight Bearing Restrictions Per Provider Order: No      Mobility  Bed Mobility Overal bed mobility: Modified Independent              General bed mobility comments: extra time, no assist    Transfers Overall transfer level: Needs assistance Equipment used: Rollator (4 wheels) Transfers: Sit to/from Stand Sit to Stand: Supervision           General transfer comment: Supevision for safety, slow to rise but good awareness. No LOB with rollator for support.    Ambulation/Gait Ambulation/Gait assistance: Supervision Gait Distance (Feet): 80 Feet Assistive device: Rollator (4 wheels) Gait Pattern/deviations: Step-through pattern, Decreased stride length, Drifts right/left Gait velocity: dec Gait velocity interpretation: <1.31 ft/sec, indicative of household ambulator   General Gait Details: Minimal instability, slower and guarded. needed one standing rest break to complete with SpO2 95% and higher on RA, no dyspnea noted, but subjective SOB. Able to self correct and controls rollator well. Educated on safety, brake use, and energy conservation.  Stairs Stairs:  (Declined at this time, states family can assist. Appears to have adequate strength/endurance to navigate her 2 steps at home with family guarding.)          Wheelchair Mobility     Tilt Bed    Modified Rankin (Stroke Patients Only)       Balance Overall balance assessment: Mild deficits observed, not formally tested                                           Pertinent Vitals/Pain Pain Assessment Pain Assessment: No/denies pain    Home Living Family/patient expects to be discharged to:: Private residence Living Arrangements: Spouse/significant other Available Help at Discharge: Family;Available 24 hours/day Type of Home: House Home  Access: Stairs to enter Entrance Stairs-Rails: Right Entrance Stairs-Number of Steps: 2   Home Layout: Two level;Able to live on main level with bedroom/bathroom Home Equipment: Educational psychologist (4 wheels);Rolling Walker (2 wheels);Wheelchair - manual Additional Comments: Pt lives at  home with husband who is available 24/7, other family is close by    Prior Function Prior Level of Function : Needs assist             Mobility Comments: has only been going to bathroom/recliner/bed over the last few weeks due to SOB/fatigue, has been using rollator for a week. no falls ADLs Comments: Typically independent but over the last week has needed help     Extremity/Trunk Assessment   Upper Extremity Assessment Upper Extremity Assessment: Defer to OT evaluation    Lower Extremity Assessment Lower Extremity Assessment: Generalized weakness       Communication   Communication Communication: No apparent difficulties    Cognition Arousal: Alert Behavior During Therapy: WFL for tasks assessed/performed   PT - Cognitive impairments: No apparent impairments                         Following commands: Intact       Cueing Cueing Techniques: Verbal cues     General Comments General comments (skin integrity, edema, etc.): SpO2 95% and greater during session on RA.    Exercises     Assessment/Plan    PT Assessment Patient needs continued PT services  PT Problem List Decreased strength;Decreased activity tolerance;Decreased balance;Decreased mobility;Decreased knowledge of use of DME;Cardiopulmonary status limiting activity       PT Treatment Interventions DME instruction;Gait training;Functional mobility training;Stair training;Therapeutic activities;Therapeutic exercise;Neuromuscular re-education;Balance training;Patient/family education    PT Goals (Current goals can be found in the Care Plan section)  Acute Rehab PT Goals Patient Stated Goal: Go home PT Goal Formulation: With patient Time For Goal Achievement: 12/27/23 Potential to Achieve Goals: Good    Frequency Min 2X/week     Co-evaluation               AM-PAC PT "6 Clicks" Mobility  Outcome Measure Help needed turning from your back to your side while in a flat bed without  using bedrails?: None Help needed moving from lying on your back to sitting on the side of a flat bed without using bedrails?: None Help needed moving to and from a bed to a chair (including a wheelchair)?: A Little Help needed standing up from a chair using your arms (e.g., wheelchair or bedside chair)?: A Little Help needed to walk in hospital room?: A Little Help needed climbing 3-5 steps with a railing? : A Little 6 Click Score: 20    End of Session Equipment Utilized During Treatment: Gait belt Activity Tolerance: Patient tolerated treatment well Patient left: in bed;with call bell/phone within reach;with bed alarm set;with family/visitor present   PT Visit Diagnosis: Unsteadiness on feet (R26.81);Other abnormalities of gait and mobility (R26.89)    Time: 0454-0981 PT Time Calculation (min) (ACUTE ONLY): 13 min   Charges:   PT Evaluation $PT Eval Low Complexity: 1 Low   PT General Charges $$ ACUTE PT VISIT: 1 Visit         Jory Ng, PT, DPT [facility]  Rehabilitation Services Physical Therapist Office: [phone] Website: [email or web address]   Alinda Irani 12/13/2023, 3:47 PM

## 2023-12-13 NOTE — Progress Notes (Signed)
 PT Cancellation Note  Patient Details Name: Dana Sandoval MRN: [id number] DOB: 1941/02/15   Cancelled Treatment:    Reason Eval/Treat Not Completed: Other (comment)  Staff assisting with hygiene care this morning. Will check back a little later for comprehensive PT evaluation.  Jory Ng, PT, DPT [facility]  Rehabilitation Services Physical Therapist Office: [phone] Website: [email or web address]  Alinda Irani 12/13/2023, 10:19 AM

## 2023-12-13 NOTE — Plan of Care (Signed)
  Problem: Education: Goal: Knowledge of General Education information will improve Description: Including pain rating scale, medication(s)/side effects and non-pharmacologic comfort measures Outcome: Progressing   Problem: Clinical Measurements: Goal: Ability to maintain clinical measurements within normal limits will improve Outcome: Progressing Goal: Will remain free from infection Outcome: Progressing Goal: Diagnostic test results will improve Outcome: Progressing Goal: Cardiovascular complication will be avoided Outcome: Progressing   Problem: Activity: Goal: Risk for activity intolerance will decrease Outcome: Progressing   Problem: Nutrition: Goal: Adequate nutrition will be maintained Outcome: Progressing   Problem: Coping: Goal: Level of anxiety will decrease Outcome: Progressing   Problem: Elimination: Goal: Will not experience complications related to bowel motility Outcome: Progressing   Problem: Safety: Goal: Ability to remain free from injury will improve Outcome: Progressing

## 2023-12-13 NOTE — Care Management Important Message (Signed)
 Important Message  Patient Details  Name: Dana Sandoval MRN: [id number] Date of Birth: [birth year]   Important Message Given:  Yes - Medicare IM     Wynonia Hedges 12/13/2023, 1:58 PM

## 2023-12-13 NOTE — Progress Notes (Addendum)
 PROGRESS NOTE        PATIENT DETAILS Name: Dana Sandoval Age: 83 y.o. Sex: female Date of Birth: August 14, 1940 Admit Date: 12/10/2023 Admitting Physician Myrl Askew, MD [id number], Gwynda Leriche, MD  Brief Summary: Patient is a 83 y.o.  female with history of severe aortic stenosis, PAF, HTN, mild to moderate mitral regurgitation, HLD who presented with fatigue/weakness/exertional dyspnea-she acknowledged black stools intermittently for the past several weeks-she was found to have a hemoglobin of 4.8 and subsequently admitted to the hospitalist service.   Significant events: 6/2>> admit to [facility]  Significant studies: 6/2>> CT abdomen/pelvis: No retroperitoneal hematoma.  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  Procedures: 6/4>> EGD: No bleeding etiology identified 6/4>> colonoscopy: No bleeding etiology identified  Consults: GI  Subjective: No BM since yesterday.  Lying comfortably in bed.  Complains of some restless leg.  Objective: Vitals: Blood pressure 113/61, pulse 67, temperature (!) 97.4 F (36.3 C), temperature source Oral, resp. rate 19, height 5\' 2"  (1.575 m), weight 74.3 kg, SpO2 96%.   Exam: Awake/alert Chest: Clear to auscultation CVS: S1-S2 regular Abdomen: Soft nontender nondistended Extremities: No edema Nonfocal exam.  Pertinent Labs/Radiology:    Latest Ref Rng & Units 12/13/2023    4:39 AM 12/12/2023    7:44 AM 12/11/2023   10:03 PM  CBC  WBC 4.0 - 10.5 K/uL 8.9  10.9  12.0   Hemoglobin 12.0 - 15.0 g/dL 9.7  40.9  81.1   Hematocrit 36.0 - 46.0 % 29.7  32.0  32.5   Platelets 150 - 400 K/uL 311  334  362     Lab Results  Component Value Date   NA 137 12/13/2023   K 3.4 (L) 12/13/2023   CL 107 12/13/2023   CO2 22 12/13/2023    Assessment/Plan: UGI bleed with ABLA No bleeding etiology evident on EGD/colonoscopy.  No BM overnight. Small bowel capsule endoscopy in progress Hb relatively stable-s/p 2  units of PRBC-last transfused on 6/3. Continue to trend CBC periodically.  Addendum: D/w Dr Alyne Babinski endoscopy neg-ok to restart Eliquis -will order  AKI Hemodynamically mediated due to upper GI bleeding Improved with PRBC/IVF-back to baseline.  Hypokalemia Continue to replete/recheck.  Elevated troponin Likely demand ischemia/type II non-STEMI Per prior documentation-no intervention recommended by cardiology.  Severe aortic stenosis Undergoing TAVR evaluation-tentatively scheduled for 6/10. Prior MD had discussed with cardiology-continue GI workup as planned.  PAF Telemetry monitoring Metoprolol /amiodarone  Eliquis  on hold due to UGI bleed.  Will need to discuss with GI regarding timing of reinitiation of Eliquis .  HLD Statin  Hypothyroidism Synthroid  Recent TSH 4/1-stable.  Pancreatic cyst-uncinate process Incidental finding Per radiology-better visualized on prior imaging Recommendations from radiology are to repeat dedicated follow-up in 1 year.  Restless leg syndrome Chronic issue per patient Start iron supplementation Family requesting oxycodone  or benzos so that she can sleep-but will try Requip instead.  Code status:   Code Status: Full Code   DVT Prophylaxis: SCDs Start: 12/10/23 2222   Family Communication: Granddaughter at bedside.   Disposition Plan: Status is: Inpatient Remains inpatient appropriate because: Severity of illness   Planned Discharge Destination:Home   Diet: Diet Order             Diet regular Room service appropriate? Yes; Fluid consistency: Thin  Diet effective now  Antimicrobial agents: Anti-infectives (From admission, onward)    None        MEDICATIONS: Scheduled Meds:  amiodarone   100 mg Oral Daily   atorvastatin   40 mg Oral Daily   levothyroxine   88 mcg Oral Q0600   metoprolol  succinate  12.5 mg Oral Daily   pantoprazole (PROTONIX) IV  40 mg Intravenous Q12H   Continuous  Infusions:   PRN Meds:.acetaminophen  **OR** acetaminophen , ondansetron  **OR** ondansetron  (ZOFRAN ) IV, oxyCODONE    I have personally reviewed following labs and imaging studies  LABORATORY DATA: CBC: Recent Labs  Lab 12/11/23 0551 12/11/23 1425 12/11/23 2203 12/12/23 0744 12/13/23 0439  WBC 12.6* 13.0* 12.0* 10.9* 8.9  HGB 10.4* 11.6* 11.2* 10.9* 9.7*  HCT 30.7* 33.9* 32.5* 32.0* 29.7*  MCV 81.6 80.7 82.1 82.7 83.9  PLT 338 364 362 334 311    Basic Metabolic Panel: Recent Labs  Lab 12/10/23 1647 12/10/23 1805 12/11/23 0551 12/12/23 0744 12/13/23 0439  NA 138  --  140 137 137  K 3.1*  --  3.3* 3.0* 3.4*  CL 105  --  109 105 107  CO2 20*  --  23 25 22   GLUCOSE 218*  --  155* 107* 98  BUN 26*  --  28* 20 16  CREATININE 1.71*  --  1.46* 1.12* 1.23*  CALCIUM  7.8*  --  8.0* 8.3* 7.8*  MG  --  1.7  --  1.7 1.8    GFR: Estimated Creatinine Clearance: 32.1 mL/min (A) (by C-G formula based on SCr of 1.23 mg/dL (H)).  Liver Function Tests: Recent Labs  Lab 12/10/23 1805  AST 20  ALT 14  ALKPHOS 51  BILITOT 0.7  PROT 5.3*  ALBUMIN 3.1*   No results for input(s): "LIPASE", "AMYLASE" in the last 168 hours. No results for input(s): "AMMONIA" in the last 168 hours.  Coagulation Profile: Recent Labs  Lab 12/10/23 1854  INR 1.9*    Cardiac Enzymes: No results for input(s): "CKTOTAL", "CKMB", "CKMBINDEX", "TROPONINI" in the last 168 hours.  BNP (last 3 results) No results for input(s): "PROBNP" in the last 8760 hours.  Lipid Profile: No results for input(s): "CHOL", "HDL", "LDLCALC", "TRIG", "CHOLHDL", "LDLDIRECT" in the last 72 hours.  Thyroid  Function Tests: No results for input(s): "TSH", "T4TOTAL", "FREET4", "T3FREE", "THYROIDAB" in the last 72 hours.  Anemia Panel: Recent Labs    12/10/23 1805 12/10/23 1806  VITAMINB12  --  1,735*  FOLATE  --  15.5  FERRITIN 13  --   TIBC 315  --   IRON 11*  --   RETICCTPCT  --  6.3*    Urine analysis:     Component Value Date/Time   COLORURINE YELLOW 09/02/2018 0850   APPEARANCEUR CLEAR 09/02/2018 0850   LABSPEC 1.011 09/02/2018 0850   PHURINE 7.0 09/02/2018 0850   GLUCOSEU NEGATIVE 09/02/2018 0850   HGBUR NEGATIVE 09/02/2018 0850   BILIRUBINUR NEGATIVE 09/02/2018 0850   KETONESUR NEGATIVE 09/02/2018 0850   PROTEINUR NEGATIVE 09/02/2018 0850   NITRITE NEGATIVE 09/02/2018 0850   LEUKOCYTESUR MODERATE (A) 09/02/2018 0850    Sepsis Labs: Lactic Acid, Venous No results found for: "LATICACIDVEN"  MICROBIOLOGY: No results found for this or any previous visit (from the past 240 hours).  RADIOLOGY STUDIES/RESULTS: No results found.    LOS: 3 days   Kimberly Penna, MD  [facility]    To contact the attending provider between 7A-7P or the covering provider during after hours 7P-7A, please log into the web site [email or web address] and  access using universal [place] password for that web site. If you do not have the password, please call the hospital operator.  12/13/2023, 9:11 AM

## 2023-12-14 ENCOUNTER — Other Ambulatory Visit (HOSPITAL_COMMUNITY): Payer: Self-pay

## 2023-12-14 ENCOUNTER — Other Ambulatory Visit: Payer: Self-pay | Admitting: Physician Assistant

## 2023-12-14 ENCOUNTER — Inpatient Hospital Stay (HOSPITAL_COMMUNITY)
Admission: RE | Admit: 2023-12-14 | Discharge: 2023-12-14 | Disposition: A | Discharge status: Home / Self Care | Source: Ambulatory Visit

## 2023-12-14 DIAGNOSIS — D649 Anemia, unspecified: Secondary | ICD-10-CM | POA: Diagnosis not present

## 2023-12-14 DIAGNOSIS — D500 Iron deficiency anemia secondary to blood loss (chronic): Secondary | ICD-10-CM | POA: Diagnosis not present

## 2023-12-14 DIAGNOSIS — I4891 Unspecified atrial fibrillation: Secondary | ICD-10-CM | POA: Diagnosis not present

## 2023-12-14 DIAGNOSIS — I350 Nonrheumatic aortic (valve) stenosis: Secondary | ICD-10-CM

## 2023-12-14 DIAGNOSIS — N179 Acute kidney failure, unspecified: Secondary | ICD-10-CM | POA: Diagnosis not present

## 2023-12-14 LAB — CBC
HCT: 32.3 % — ABNORMAL LOW (ref 36.0–46.0)
Hemoglobin: 10.5 g/dL — ABNORMAL LOW (ref 12.0–15.0)
MCH: 27.8 pg (ref 26.0–34.0)
MCHC: 32.5 g/dL (ref 30.0–36.0)
MCV: 85.4 fL (ref 80.0–100.0)
Platelets: 341 10*3/uL (ref 150–400)
RBC: 3.78 MIL/uL — ABNORMAL LOW (ref 3.87–5.11)
RDW: 16.2 % — ABNORMAL HIGH (ref 11.5–15.5)
WBC: 7.9 10*3/uL (ref 4.0–10.5)
nRBC: 0 % (ref 0.0–0.2)

## 2023-12-14 MED ORDER — FERROUS SULFATE 325 (65 FE) MG PO TABS
325.0000 mg | ORAL_TABLET | Freq: Two times a day (BID) | ORAL | 3 refills | Status: DC
  Filled 2023-12-14 – 2024-01-12 (×2): qty 60, 30d supply, fill #0
  Filled 2024-02-11: qty 60, 30d supply, fill #1
  Filled 2024-03-12 (×2): qty 60, 30d supply, fill #2

## 2023-12-14 MED ORDER — PANTOPRAZOLE SODIUM 40 MG PO TBEC
40.0000 mg | DELAYED_RELEASE_TABLET | Freq: Every day | ORAL | 1 refills | Status: DC
Start: 2023-12-14 — End: 2024-02-17
  Filled 2023-12-14 – 2024-01-12 (×2): qty 30, 30d supply, fill #0

## 2023-12-14 MED ORDER — ROPINIROLE HCL 0.25 MG PO TABS
0.2500 mg | ORAL_TABLET | Freq: Every day | ORAL | 1 refills | Status: DC
  Filled 2023-12-14 – 2024-01-12 (×2): qty 30, 30d supply, fill #0

## 2023-12-14 NOTE — Plan of Care (Signed)
  Problem: Education: Goal: Knowledge of General Education information will improve Description: Including pain rating scale, medication(s)/side effects and non-pharmacologic comfort measures Outcome: Progressing   Problem: Health Behavior/Discharge Planning: Goal: Ability to manage health-related needs will improve Outcome: Progressing   Problem: Clinical Measurements: Goal: Will remain free from infection Outcome: Progressing Goal: Diagnostic test results will improve Outcome: Progressing   Problem: Activity: Goal: Risk for activity intolerance will decrease Outcome: Progressing   Problem: Nutrition: Goal: Adequate nutrition will be maintained Outcome: Progressing   Problem: Coping: Goal: Level of anxiety will decrease Outcome: Progressing

## 2023-12-14 NOTE — Discharge Summary (Signed)
 PATIENT DETAILS Name: Dana Sandoval Age: 83 y.o. Sex: female Date of Birth: 1941/06/11 MRN: [id number]. Admitting Physician: Myrl Askew, MD WUJ:[id number], Gwynda Leriche, MD  Admit Date: 12/10/2023 Discharge date: 12/14/2023  Recommendations for Outpatient Follow-up:  Follow up with PCP in 1-2 weeks Please obtain CMP/CBC in one week Please ensure follow up with cardiology Known incidental finding on CT-pancreatic cyst-radiology recommending-repeat dedicated pancreatic protocol CT/MRI in 1 year.  Admitted From:  Home  Disposition: Home health   Discharge Condition: good  CODE STATUS:   Code Status: Full Code   Diet recommendation:  Diet Order             Diet - low sodium heart healthy           Diet regular Room service appropriate? Yes; Fluid consistency: Thin  Diet effective now                    Brief Summary: Patient is a 83 y.o.  female with history of severe aortic stenosis, PAF, HTN, mild to moderate mitral regurgitation, HLD who presented with fatigue/weakness/exertional dyspnea-she acknowledged black stools intermittently for the past several weeks-she was found to have a hemoglobin of 4.8 and subsequently admitted to the hospitalist service.    Significant events: 6/2>> admit to [facility]   Significant studies: 6/2>> CT abdomen/pelvis: No retroperitoneal hematoma.  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   Procedures: 6/4>> EGD: No bleeding etiology identified 6/4>> colonoscopy: No bleeding etiology identified 6/4>> capsule endoscopy: Prelim-discussed with Dr. Donnamaria Gable bleeding identified.   Consults: GI  Brief Hospital Course: UGI bleed with ABLA Unclear etiology-no bleeding etiology seen on EGD/colonoscopy/capsule endoscopy Hb stable after 2 units of PRBC-last transfused on 6/3 After discussion with GI-Dr. Barkley Boot on 6/5-started on Eliquis -no bleeding overnight-Hb remains stable. Continue close outpatient monitoring.    AKI Hemodynamically mediated due to upper GI bleeding Improved with PRBC/IVF-back to baseline.   Hypokalemia Repleted-recheck in PCPs office in 1 week.   Elevated troponin Likely demand ischemia/type II non-STEMI Per prior documentation-no intervention recommended by cardiology.   Severe aortic stenosis Undergoing TAVR evaluation-tentatively scheduled for 6/10. Discussed with cardiology/TAVR team-per Dr. Thukani-TAVR will likely be postponed for 1-2 weeks to let patient settle down.  Patient will be evaluated by TAVR team prior to discharge from the hospital today.  PAF Metoprolol /amiodarone  Eliquis  initially held-after GI workup-resumed on 6/5-no bleeding issues overnight-Hb remained stable.     HLD Statin   Hypothyroidism Synthroid  Recent TSH 4/1-stable.   Pancreatic cyst-uncinate process Incidental finding Per radiology-better visualized on prior imaging Recommendations from radiology are to repeat dedicated follow-up in 1 year.   Restless leg syndrome Chronic issue per patient At patient's request-started on low-dose Requip-and placed on iron supplementation as well-she thinks her symptoms better controlled.  PCP to slowly increase Requip dosing over the next several days/weeks. Family requesting oxycodone  or benzos so that she can sleep-but will try Requip instead.   Discharge Diagnoses:  Principal Problem:   Symptomatic anemia Active Problems:   Atrial fibrillation (HCC)   Upper GI bleed   Iron deficiency anemia due to chronic blood loss   AKI (acute kidney injury) (HCC)   Troponin level elevated   Discharge Instructions:  Activity:  As tolerated   Discharge Instructions     Call MD for:   Complete by: As directed    Black tarry stools or bloody stools.   Call MD for:  extreme fatigue   Complete by: As directed  Call MD for:  persistant dizziness or light-headedness   Complete by: As directed    Diet - low sodium heart healthy   Complete by: As  directed    Discharge instructions   Complete by: As directed    Follow with Primary MD  Donnie Galea, MD in 1-2 weeks  Please get a complete blood count and chemistry panel checked by your Primary MD at your next visit, and again as instructed by your Primary MD.  Get Medicines reviewed and adjusted: Please take all your medications with you for your next visit with your Primary MD  Laboratory/radiological data: Please request your Primary MD to go over all hospital tests and procedure/radiological results at the follow up, please ask your Primary MD to get all Hospital records sent to his/her office.  In some cases, they will be blood work, cultures and biopsy results pending at the time of your discharge. Please request that your primary care M.D. follows up on these results.  Also Note the following: If you experience worsening of your admission symptoms, develop shortness of breath, life threatening emergency, suicidal or homicidal thoughts you must seek medical attention immediately by calling 911 or calling your MD immediately  if symptoms less severe.  You must read complete instructions/literature along with all the possible adverse reactions/side effects for all the Medicines you take and that have been prescribed to you. Take any new Medicines after you have completely understood and accpet all the possible adverse reactions/side effects.   Do not drive when taking Pain medications or sleeping medications (Benzodaizepines)  Do not take more than prescribed Pain, Sleep and Anxiety Medications. It is not advisable to combine anxiety,sleep and pain medications without talking with your primary care practitioner  Special Instructions: If you have smoked or chewed Tobacco  in the last 2 yrs please stop smoking, stop any regular Alcohol  and or any Recreational drug use.  Wear Seat belts while driving.  Please note: You were cared for by a hospitalist during your hospital stay.  Once you are discharged, your primary care physician will handle any further medical issues. Please note that NO REFILLS for any discharge medications will be authorized once you are discharged, as it is imperative that you return to your primary care physician (or establish a relationship with a primary care physician if you do not have one) for your post hospital discharge needs so that they can reassess your need for medications and monitor your lab values.   Increase activity slowly   Complete by: As directed       Allergies as of 12/14/2023       Reactions   Codeine Itching   After 3 days   Dilaudid [hydromorphone Hcl] Itching   After 3 days        Medication List     TAKE these medications    Abreva 10 % Crea Generic drug: Docosanol Apply 1 application  topically as needed (fever blister).   amiodarone  200 MG tablet Commonly known as: PACERONE  Take 0.5 tablets (100 mg total) by mouth daily.   apixaban  5 MG Tabs tablet Commonly known as: Eliquis  Take 1 tablet (5 mg total) by mouth 2 (two) times daily.   atorvastatin  40 MG tablet Commonly known as: LIPITOR Take 1 tablet (40 mg total) by mouth daily at [facility]. What changed: when to take this   Cholecalciferol 25 MCG (1000 UT) capsule Take 1,000 Units by mouth daily.   cyanocobalamin  1000 MCG tablet  Commonly known as: VITAMIN B12 Take 1 tablet (1,000 mcg total) by mouth daily. What changed:  when to take this additional instructions   ferrous sulfate 325 (65 FE) MG tablet Take 1 tablet (325 mg total) by mouth 2 (two) times daily with a meal.   furosemide  20 MG tablet Commonly known as: LASIX  Take 1 tablet (20 mg total) by mouth 2 (two) times daily.   levothyroxine  88 MCG tablet Commonly known as: SYNTHROID  Take 1 tablet (88 mcg total) by mouth daily.   metoprolol  succinate 25 MG 24 hr tablet Commonly known as: TOPROL -XL Take 0.5 tablets (12.5 mg total) by mouth daily. Take with or immediately following a  meal.   OneTouch Delica Plus Lancet33G Misc use one in the morning, at noon, and at bedtime to check blood glucose levels   OneTouch Verio test strip Generic drug: glucose blood use to test blood glucose levels in the morning, at noon, and at bedtime.   pantoprazole 40 MG tablet Commonly known as: Protonix Take 1 tablet (40 mg total) by mouth daily.   PRESERVISION AREDS 2 PO Take 1 tablet by mouth 2 (two) times daily.   pyridOXINE 50 MG tablet Commonly known as: B-6 Take 50 mg by mouth daily.   rOPINIRole 0.25 MG tablet Commonly known as: REQUIP Take 1 tablet (0.25 mg total) by mouth at bedtime.   valACYclovir  500 MG tablet Commonly known as: VALTREX  Take 1 tablet (500 mg total) by mouth daily as needed (fever blister).   VITAMIN C PO Take 500 mg by mouth in the morning.        Follow-up Information     Donnie Galea, MD. Schedule an appointment as soon as possible for a visit in 1 week(s).   Specialty: Family Medicine Contact information: [address] [place] [address] [phone]                Allergies  Allergen Reactions   Codeine Itching    After 3 days   Dilaudid [Hydromorphone Hcl] Itching    After 3 days     Other Procedures/Studies: CT ABDOMEN PELVIS WO CONTRAST Result Date: 12/10/2023 CLINICAL DATA:  Back pain, anemia, concern for retroperitoneal hemorrhage EXAM: CT ABDOMEN AND PELVIS WITHOUT CONTRAST TECHNIQUE: Multidetector CT imaging of the abdomen and pelvis was performed following the standard protocol without IV contrast. RADIATION DOSE REDUCTION: This exam was performed according to the departmental dose-optimization program which includes automated exposure control, adjustment of the mA and/or kV according to patient size and/or use of iterative reconstruction technique. COMPARISON:  CTA chest abdomen pelvis dated 11/22/2023 FINDINGS: Lower chest: Moderate bilateral pleural effusions with associated bilateral lower lobe  atelectasis Hepatobiliary: Liver is unremarkable. Gallbladder is unremarkable. No intrahepatic or extrahepatic ductal dilatation. 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 No pancreatic atrophy or ductal dilatation. Spleen: Within normal limits. Adrenals/Urinary Tract: Adrenal glands are within normal limits. Simple bilateral renal cysts, measuring up to 3.7 cm in the right lower kidney (image 34), benign (Bosniak I). No follow-up is recommended. 3 mm nonobstructing left upper pole renal calculus (image 21). No hydronephrosis. Bladder is underdistended but unremarkable. Stomach/Bowel: Stomach is within normal limits. No evidence of bowel obstruction. Appendix is not discretely visualized. No colonic wall thickening or inflammatory changes. Vascular/Lymphatic: Atherosclerotic calcifications of the abdominal aorta and branch vessels. No evidence of abdominal aortic aneurysm. No suspicious abdominopelvic lymphadenopathy. Reproductive: Status  post hysterectomy. Bilateral ovaries are within normal limits. Other: No abdominopelvic ascites. Tiny fat containing periumbilical hernia. Musculoskeletal: No evidence of abdominal wall or retroperitoneal hematoma. Mild degenerative changes of the visualized thoracolumbar spine. IMPRESSION: No evidence of abdominal wall or retroperitoneal hematoma. 3 mm nonobstructing left upper pole renal calculus. No hydronephrosis. 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 Given the patient's age, dedicated follow-up is considered optional in 1 year. Moderate bilateral pleural effusions with associated bilateral lower lobe atelectasis. Electronically Signed   By: Zadie Herter M.D.   On: 12/10/2023 20:20   DG Chest Portable 1 View Result Date: 12/10/2023 CLINICAL DATA:  Dyspnea, pain between shoulder blades EXAM: PORTABLE  CHEST - 1 VIEW COMPARISON:  September 02, 2018 FINDINGS: Blunting of the right costophrenic sulcus. Trace left pleural effusion. Few patchy airspace opacities are also noted in both lung bases. No pneumothorax. No cardiomegaly. Aortic atherosclerosis. No acute fracture or destructive lesion. Multilevel thoracic osteophytosis. 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 IMPRESSION: Likely layering right pleural effusion, small to moderate in volume. Trace left pleural effusion. Patchy airspace opacities in both lung bases, which may represent atelectasis or a developing bronchopneumonia, in the correct clinical context. Electronically Signed   By: Rance Burrows M.D.   On: 12/10/2023 18:58   CT CORONARY MORPH W/CTA COR W/SCORE W/CA W/CM &/OR WO/CM Addendum Date: 12/09/2023 ADDENDUM REPORT: 12/09/2023 09:11 EXAM: OVER-READ INTERPRETATION  CT CHEST The following report is an over-read performed by radiologist Dr. Violeta Grey of [facility] on 12/09/2023. This over-read does not include interpretation of cardiac or coronary anatomy or pathology. The coronary calcium  score/coronary CTA interpretation by the cardiologist is attached. COMPARISON:  None. FINDINGS: Cardiovascular: There are no significant extracardiac vascular findings. Mediastinum/Nodes: There are no enlarged lymph nodes within the visualized mediastinum. Lungs/Pleura: Large bilateral pleural effusions are noted. Minimal basilar atelectasis is noted. Upper abdomen: No significant findings in the visualized upper abdomen. Musculoskeletal/Chest wall: No chest wall mass or suspicious osseous findings within the visualized chest. IMPRESSION: Bilateral large pleural effusions with associated atelectatic changes. Electronically Signed   By: Violeta Grey M.D.   On: 12/09/2023 09:11   Result Date: 12/09/2023 CLINICAL DATA:  Severe Aortic Stenosis. EXAM: Cardiac TAVR CT TECHNIQUE: A  non-contrast, gated CT scan was obtained with axial slices of 2.5 mm through the heart for aortic valve scoring. A 120 kV retrospective, gated, contrast cardiac scan was obtained. Gantry rotation speed was 230 msec and collimation was 0.63 mm. Nitroglycerin was not given. A delayed scan was obtained to exclude left atrial appendage thrombus. The 3D dataset was reconstructed in systole with motion correction. The 3D data set was reconstructed in 5% intervals of the 0-95% of the R-R cycle. Systolic and diastolic phases were analyzed on a dedicated workstation using MPR, MIP, and VRT modes. The patient received 100 cc of contrast. FINDINGS: Aortic Valve: Valve Morphology: Tricuspid, heavily calcified valve with reduced excursion the planimeter valve area is 0.983 Sq cm consistent with severe aortic stenosis Annular and subannular calcification: Distal subannular calcification of the intervalvular fibrosa Aortic Valve Calcium  Score: 1599 Perimembranous septal diameter: 6 mm Mitral Valve: Average calcium  thickness 4 mm. Greater than 270 degrees of annular calcification with calcification of the right fibrous trigone. Minimal leaflet calcification. MAC Score of 7. Valve-in-MAC assessment not performed. Aortic Annulus Measurements- 24% phase Major annulus diameter: 23 mm Minor annulus diameter: 20 mm Annular  perimeter: 67 mm Annular area: 3.50 cm2 Aortic Measurements- 75% phase Sinotubular Junction: 27 mm Ascending Thoracic Aorta: 30 mm Aortic Arch: 20 mm Descending Thoracic Aorta: 22 mm Aortic atherosclerosis. Sinus of Valsalva Measurements: Right coronary cusp width: 28 mm Left coronary cusp width: 29 mm Non coronary cusp width: 28 mm Coronary Artery Height above Annulus: Left Main: 18 mm Left SoV height: 23 mm Right Coronary: 18 mm Right SoV height: 23 mm Optimum Fluoroscopic Angle for Delivery: LAO 6, CAU 6 Cusp overlay view angle: RAO 0, CAU 11 Valves for structural team consideration: Sapien: 23 mm Edwards Sapien  Valve Evolut: 26 mm Evolut Valve Non TAVR Valve Findings: Coronary Arteries: Normal coronary origin. Study not completed with nitroglycerin. Coronary Calcium  Score: 80.  Age greater than 75. Systemic veins: Normal anatomy. Main Pulmonary artery: Moderate dilation, 33 mm. Pulmonary veins: Normal variant right middle pulmonary vein. Left atrial appendage: Patent Interatrial septum: No communications Chamber dimensions: Bi-atrial dilation.  Right ventricular dilation. Pericardium: No calcifications Extra Cardiac Findings as per separate reporting. Notable artifacts: Motion artifact despite Snap freeze Image quality: Fair IMPRESSION: 1. Severe Aortic stenosis. Measurements for potential interventions as above. Gloriann Larger MD Electronically Signed: By: Gloriann Larger M.D. On: 11/22/2023 15:09   LONG TERM MONITOR (3-14 DAYS) Result Date: 12/07/2023 Outpatient extended EKG monitoring for 3 days starting 11/26/2023 for bradycardia: Predominant rhythm was sinus rhythm.  Minimum heart rate 48 bpm at 3:35 PM and maximum heart rate 91 bpm at 7:07 PM with average heartbeat of 71 bpm. There was 1 SVT episode for 10 beats, brief atrial tachycardia. Occasional PACs and PVCs were noted. There were no high degree AV block, there is no pauses, no atrial fibrillation detected. There were no patient activated events.   CT ANGIO CHEST AORTA W/CM & OR WO/CM Result Date: 11/22/2023 EXAMINATION: CT ANGIO ABDOMEN PELVIS W & WO CONTRAST (accession [id number] [facility]), CT ANGIO CHEST AORTA W & OR WO CONTRAST (accession [id number] [facility]) CLINICAL INDICATION: Female, 83 years old. Aortic valve replacement (TAVR), pre-op eval TECHNIQUE: Axial CTA of the chest, abdomen and pelvis with and without 95 cc Omnipaque  350 intravenous contrast. Multiplanar and 3D reformations provided. Unless otherwise specified, incidental thyroid , adrenal, renal lesions do not require dedicated imaging follow up. Additionally, any mentioned pulmonary  nodules do not require dedicated imaging follow-up based on the Fleischner guidelines unless otherwise specified. Coronary calcifications are not identified unless otherwise specified. COMPARISON: FINDINGS: The thoracic aorta is normal in caliber. Scattered atherosclerotic changes are present. The origins of the great vessels appear patent. The thyroid  appears to be surgically absent. The main pulmonary artery is dilated. Dilatation is enlarged. There are calcifications of the aortic valve and mitral annulus. There is no adenopathy. Moderate-sized bilateral pleural effusions are noted. The trachea and mainstem bronchi are patent. Subsegmental atelectatic changes are noted within the lung bases. The abdominal aorta is normal in caliber. Scattered atherosclerotic changes are present. The celiac artery and SMA are patent. The renal arteries and IMA are patent. The common iliac arteries, internal and external iliac arteries, common femoral arteries and visualized portions of the superficial and deep femoral arteries appear patent. The liver appears normal. The gallbladder is normal. The spleen is normal. The pancreas appears normal with exception of a cystic lesion in the head measuring 2.4 cm. The adrenals are normal. Bilateral renal cysts are noted. There is bilateral renal cortical scarring. The urinary bladder is normal. The uterus is surgically absent. Large and small bowel loops are otherwise within normal  limits. No free fluid or pathologic lymphadenopathy by size criteria. There is diffuse osseous demineralization. There are degenerative changes of the spine and bony pelvis. IMPRESSION: Moderate-sized bilateral pleural effusions. 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 A follow-up multiphase MRI or CT of the abdomen is suggested in one year. Otherwise this is a preprocedural CTA with various additional findings as above. DOSE REDUCTION:  This exam was performed according to our departmental dose-optimization program which includes automated exposure control, adjustment of the mA and/or kV according to patient size and/or use of iterative reconstruction technique. Electronically signed by: [place] Engel MD 11/22/2023 02:42 PM EDT RP Workstation: [id number]   CT ANGIO ABDOMEN PELVIS  W & WO CONTRAST Result Date: 11/22/2023 EXAMINATION: CT ANGIO ABDOMEN PELVIS W & WO CONTRAST (accession [id number] [facility]), CT ANGIO CHEST AORTA W & OR WO CONTRAST (accession [id number] [facility]) CLINICAL INDICATION: Female, 83 years old. Aortic valve replacement (TAVR), pre-op eval TECHNIQUE: Axial CTA of the chest, abdomen and pelvis with and without 95 cc Omnipaque  350 intravenous contrast. Multiplanar and 3D reformations provided. Unless otherwise specified, incidental thyroid , adrenal, renal lesions do not require dedicated imaging follow up. Additionally, any mentioned pulmonary nodules do not require dedicated imaging follow-up based on the Fleischner guidelines unless otherwise specified. Coronary calcifications are not identified unless otherwise specified. COMPARISON: FINDINGS: The thoracic aorta is normal in caliber. Scattered atherosclerotic changes are present. The origins of the great vessels appear patent. The thyroid  appears to be surgically absent. The main pulmonary artery is dilated. Dilatation is enlarged. There are calcifications of the aortic valve and mitral annulus. There is no adenopathy. Moderate-sized bilateral pleural effusions are noted. The trachea and mainstem bronchi are patent. Subsegmental atelectatic changes are noted within the lung bases. The abdominal aorta is normal in caliber. Scattered atherosclerotic changes are present. The celiac artery and SMA are patent. The renal arteries and IMA are patent. The common iliac arteries, internal and external iliac arteries, common femoral arteries and visualized portions of the superficial and deep  femoral arteries appear patent. The liver appears normal. The gallbladder is normal. The spleen is normal. The pancreas appears normal with exception of a cystic lesion in the head measuring 2.4 cm. The adrenals are normal. Bilateral renal cysts are noted. There is bilateral renal cortical scarring. The urinary bladder is normal. The uterus is surgically absent. Large and small bowel loops are otherwise within normal limits. No free fluid or pathologic lymphadenopathy by size criteria. There is diffuse osseous demineralization. There are degenerative changes of the spine and bony pelvis. IMPRESSION: Moderate-sized bilateral pleural effusions. 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 A follow-up multiphase MRI or CT of the abdomen is suggested in one year. Otherwise this is a preprocedural CTA with various additional findings as above. DOSE REDUCTION: This exam was performed according to our departmental dose-optimization program which includes automated exposure control, adjustment of the mA and/or kV according to patient size and/or use of iterative reconstruction technique. Electronically signed by: [place] Engel MD 11/22/2023 02:42 PM EDT RP Workstation: [id number]     TODAY-DAY OF DISCHARGE:  Subjective:   Dana Sandoval today has no headache,no chest abdominal pain,no new weakness tingling or numbness, feels much better wants to go home today.   Objective:   Blood pressure (!) 105/50, pulse 61, temperature (!) 97.3 F (36.3 C), temperature source Oral, resp. rate 19, height 5\' 2"  (1.575 m),  weight 74.3 kg, SpO2 95%.  Intake/Output Summary (Last 24 hours) at 12/14/2023 0800 Last data filed at 12/13/2023 0900 Gross per 24 hour  Intake 240 ml  Output --  Net 240 ml   Filed Weights   12/12/23 1657  Weight: 74.3 kg    Exam: Awake Alert, Oriented *3, No new F.N deficits, Normal affect [place].AT,PERRAL Supple Neck,No JVD,  No cervical lymphadenopathy appriciated.  Symmetrical Chest wall movement, Good air movement bilaterally, CTAB RRR,No Gallops,Rubs or new Murmurs, No Parasternal Heave +ve B.Sounds, Abd Soft, Non tender, No organomegaly appriciated, No rebound -guarding or rigidity. No Cyanosis, Clubbing or edema, No new Rash or bruise   PERTINENT RADIOLOGIC STUDIES: No results found.   PERTINENT LAB RESULTS: CBC: Recent Labs    12/13/23 0439 12/14/23 0610  WBC 8.9 7.9  HGB 9.7* 10.5*  HCT 29.7* 32.3*  PLT 311 341   CMET CMP     Component Value Date/Time   NA 137 12/13/2023 0439   NA 142 11/22/2023 0941   K 3.4 (L) 12/13/2023 0439   CL 107 12/13/2023 0439   CO2 22 12/13/2023 0439   GLUCOSE 98 12/13/2023 0439   BUN 16 12/13/2023 0439   BUN 19 11/22/2023 0941   CREATININE 1.23 (H) 12/13/2023 0439   CALCIUM  7.8 (L) 12/13/2023 0439   PROT 5.3 (L) 12/10/2023 1805   ALBUMIN 3.1 (L) 12/10/2023 1805   AST 20 12/10/2023 1805   ALT 14 12/10/2023 1805   ALKPHOS 51 12/10/2023 1805   BILITOT 0.7 12/10/2023 1805   GFR 65.68 10/09/2023 0758   EGFR 46 (L) 11/22/2023 0941   GFRNONAA 43 (L) 12/13/2023 0439    GFR Estimated Creatinine Clearance: 32.1 mL/min (A) (by C-G formula based on SCr of 1.23 mg/dL (H)). No results for input(s): "LIPASE", "AMYLASE" in the last 72 hours. No results for input(s): "CKTOTAL", "CKMB", "CKMBINDEX", "TROPONINI" in the last 72 hours. Invalid input(s): "POCBNP" No results for input(s): "DDIMER" in the last 72 hours. No results for input(s): "HGBA1C" in the last 72 hours. No results for input(s): "CHOL", "HDL", "LDLCALC", "TRIG", "CHOLHDL", "LDLDIRECT" in the last 72 hours. No results for input(s): "TSH", "T4TOTAL", "T3FREE", "THYROIDAB" in the last 72 hours.  Invalid input(s): "FREET3" No results for input(s): "VITAMINB12", "FOLATE", "FERRITIN", "TIBC", "IRON", "RETICCTPCT" in the last 72 hours. Coags: No results for input(s): "INR" in the last 72  hours.  Invalid input(s): "PT" Microbiology: No results found for this or any previous visit (from the past 240 hours).  FURTHER DISCHARGE INSTRUCTIONS:  Get Medicines reviewed and adjusted: Please take all your medications with you for your next visit with your Primary MD  Laboratory/radiological data: Please request your Primary MD to go over all hospital tests and procedure/radiological results at the follow up, please ask your Primary MD to get all Hospital records sent to his/her office.  In some cases, they will be blood work, cultures and biopsy results pending at the time of your discharge. Please request that your primary care M.D. goes through all the records of your hospital data and follows up on these results.  Also Note the following: If you experience worsening of your admission symptoms, develop shortness of breath, life threatening emergency, suicidal or homicidal thoughts you must seek medical attention immediately by calling 911 or calling your MD immediately  if symptoms less severe.  You must read complete instructions/literature along with all the possible adverse reactions/side effects for all the Medicines you take and that have been prescribed to you.  Take any new Medicines after you have completely understood and accpet all the possible adverse reactions/side effects.   Do not drive when taking Pain medications or sleeping medications (Benzodaizepines)  Do not take more than prescribed Pain, Sleep and Anxiety Medications. It is not advisable to combine anxiety,sleep and pain medications without talking with your primary care practitioner  Special Instructions: If you have smoked or chewed Tobacco  in the last 2 yrs please stop smoking, stop any regular Alcohol  and or any Recreational drug use.  Wear Seat belts while driving.  Please note: You were cared for by a hospitalist during your hospital stay. Once you are discharged, your primary care physician will  handle any further medical issues. Please note that NO REFILLS for any discharge medications will be authorized once you are discharged, as it is imperative that you return to your primary care physician (or establish a relationship with a primary care physician if you do not have one) for your post hospital discharge needs so that they can reassess your need for medications and monitor your lab values.  Total Time spent coordinating discharge including counseling, education and face to face time equals greater than 30 minutes.  Signed: Keenan Trefry 12/14/2023 8:00 AM

## 2023-12-14 NOTE — TOC Transition Note (Signed)
 Transition of Care [facility]) - Discharge Note   Patient Details  Name: Dana Sandoval MRN: [id number] Date of Birth: 1941-02-27  [facility]) CM/SW Contact:  Eusebio High, RN Phone Number: 12/14/2023, 8:58 AM   Clinical Narrative:     Patient will DC to home today. HH PT has been recommended and will be provided by [facility] .  Family member will provide transportation home  AVS updated             Patient Goals and CMS Choice            Discharge Placement                       Discharge Plan and Services Additional resources added to the After Visit Summary for                                       Social Drivers of Health (SDOH) Interventions SDOH Screenings   Food Insecurity: No Food Insecurity (12/11/2023)  Housing: Low Risk  (12/11/2023)  Transportation Needs: No Transportation Needs (12/11/2023)  Utilities: Not At Risk (12/11/2023)  Alcohol Screen: Low Risk  (09/26/2022)  Depression (PHQ2-9): Low Risk  (10/16/2023)  Financial Resource Strain: Low Risk  (12/10/2023)  Physical Activity: Inactive (12/10/2023)  Social Connections: Moderately Isolated (12/11/2023)  Stress: Stress Concern Present (12/10/2023)  Tobacco Use: Low Risk  (12/10/2023)     Readmission Risk Interventions     No data to display

## 2023-12-14 NOTE — Progress Notes (Signed)
 Reviewed AVS, patient expressed understanding of medications, MD follow up reviewed.   Removed IV, Site clean, dry and intact.  Patient states all belongings brought to the hospital at time of admission are accounted for and packed to take home.  Patient informed and expressed understanding of need to pickup medications from [facility] pharmacy. Family in room; waiting for cardiologist to speak with them about the June 10th appt. Before discharge.

## 2023-12-17 NOTE — Anesthesia Postprocedure Evaluation (Signed)
 Anesthesia Post Note  Patient: Dana Sandoval  Procedure(s) Performed: EGD (ESOPHAGOGASTRODUODENOSCOPY) COLONOSCOPY IMAGING PROCEDURE, GI TRACT, INTRALUMINAL, VIA CAPSULE     Patient location during evaluation: PACU Anesthesia Type: General Level of consciousness: awake and patient cooperative Pain management: pain level controlled Vital Signs Assessment: post-procedure vital signs reviewed and stable Respiratory status: spontaneous breathing, nonlabored ventilation and respiratory function stable Cardiovascular status: blood pressure returned to baseline and stable Postop Assessment: no apparent nausea or vomiting Anesthetic complications: no   There were no known notable events for this encounter.               Enzo Treu

## 2023-12-17 NOTE — Progress Notes (Signed)
 [facility] - Clinic Note  12/24/2023     CHIEF COMPLAINT Patient presents for Retina Follow Up  HISTORY OF PRESENT ILLNESS: Dana Sandoval is a 83 y.o. 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  Severity is moderate.  Duration of 6.  Since onset it is stable.  I, the attending physician,  performed the HPI with the patient and updated documentation appropriately.        Comments   Pt here for 6 wk ret f/u exu ARMD OD. Pt states VA in OD is stable but thought she may have seen a 'spot' in OS yesterday. Unsure if its there today.       Last edited by Ronelle Coffee, MD on 12/24/2023 11:46 PM.    Patient states she noticed some changes in her left eye yesterday, she sees a dark spot in it and letters dropping off words, she is having surgery next week on her aortic stenosis, she was supposed to have it last week, but her hemoglobin was low, she spent 4-5 days in the hospital and received 4 units of blood  Referring physician: Donnie Galea, MD [address] [place],  [place] [address]  HISTORICAL INFORMATION:  Selected notes from the MEDICAL RECORD NUMBER Referred by Dr. Marvin Slot for eval of ret heme OD   CURRENT MEDICATIONS: No current outpatient medications on file. (Ophthalmic Drugs)   No current facility-administered medications for this visit. (Ophthalmic Drugs)   Current Outpatient Medications (Other)  Medication Sig   amiodarone  (PACERONE ) 200 MG tablet Take 0.5 tablets (100 mg total) by mouth daily.   apixaban  (ELIQUIS ) 5 MG TABS tablet Take 1 tablet (5 mg total) by mouth 2 (two) times daily.   Ascorbic Acid (VITAMIN C PO) Take 500 mg by mouth in the morning.   atorvastatin  (LIPITOR) 40 MG tablet Take 1 tablet (40 mg total) by mouth daily at 6PM.   Cholecalciferol 25 MCG (1000 UT) capsule Take 1,000 Units by mouth daily.   cyanocobalamin  (VITAMIN B12) 1000  MCG tablet Take 1 tablet (1,000 mcg total) by mouth 3 (three) times a week. Take on Mon-Wed-Fr.   Docosanol (ABREVA) 10 % CREA Apply 1 application  topically as needed (fever blister).   ferrous sulfate  325 (65 FE) MG tablet Take 1 tablet (325 mg total) by mouth 2 (two) times daily with a meal.   furosemide  (LASIX ) 20 MG tablet Take 1 tablet (20 mg total) by mouth 2 (two) times daily.   glucose blood (ONETOUCH VERIO) test strip use to test blood glucose levels in the morning, at noon, and at bedtime.   levothyroxine  (SYNTHROID ) 88 MCG tablet Take 1 tablet (88 mcg total) by mouth daily.   metoprolol  succinate (TOPROL -XL) 25 MG 24 hr tablet Take 0.5 tablets (12.5 mg total) by mouth daily. Take with or immediately following a meal.   Multiple Vitamins-Minerals (PRESERVISION AREDS 2 PO) Take 1 tablet by mouth 2 (two) times daily.    OneTouch Delica Lancets 33G MISC use one in the morning, at noon, and at bedtime to check blood glucose levels   pantoprazole  (PROTONIX ) 40 MG tablet Take 1 tablet (40 mg total) by mouth daily.   pyridOXINE (B-6) 50 MG tablet Take 50 mg by mouth daily.   rOPINIRole  (REQUIP ) 0.25 MG tablet Take 1 tablet (0.25 mg total) by  mouth at bedtime.   valACYclovir  (VALTREX ) 500 MG tablet Take 1 tablet (500 mg total) by mouth daily as needed (fever blister).   No current facility-administered medications for this visit. (Other)   REVIEW OF SYSTEMS: ROS   Positive for: Endocrine, Cardiovascular, Eyes Negative for: Constitutional, Gastrointestinal, Neurological, Skin, Genitourinary, Musculoskeletal, HENT, Respiratory, Psychiatric, Allergic/Imm, Heme/Lymph Last edited by Anthony Bateman, COT on 12/24/2023  8:45 AM.     ALLERGIES Allergies  Allergen Reactions   Codeine Itching    After 3 days   Dilaudid [Hydromorphone Hcl] Itching    After 3 days   PAST MEDICAL HISTORY Past Medical History:  Diagnosis Date   Arthritis    B12 deficiency    Diabetes mellitus    type 2    Hyperlipidemia    Hypertension    Hypothyroidism    IBS (irritable bowel syndrome)    Macular degeneration    Macular degeneration of right eye    Severe aortic stenosis    Thyroid  disease    HYPOTHYROIDISM   Past Surgical History:  Procedure Laterality Date   ABDOMINAL HYSTERECTOMY  1977   TAH  (FIBROIDS)   CATARACT EXTRACTION Bilateral    COLONOSCOPY N/A 12/12/2023   Procedure: COLONOSCOPY;  Surgeon: Tami Falcon, MD;  Location: [facility] ENDOSCOPY;  Service: Gastroenterology;  Laterality: N/A;   DILATION AND CURETTAGE OF UTERUS     DOPPLER ECHOCARDIOGRAPHY  2009   HAD AN ECHOCARDIOGRAM THAT SHOWED MODERATE MITRAL  ANNULAR CALCIFICATION AND A NORMAL EJECTION FRACTION OF 55%   ESOPHAGOGASTRODUODENOSCOPY N/A 12/12/2023   Procedure: EGD (ESOPHAGOGASTRODUODENOSCOPY);  Surgeon: Tami Falcon, MD;  Location: [facility] ENDOSCOPY;  Service: Gastroenterology;  Laterality: N/A;   GIVENS CAPSULE STUDY N/A 12/12/2023   Procedure: IMAGING PROCEDURE, GI TRACT, INTRALUMINAL, VIA CAPSULE;  Surgeon: Tami Falcon, MD;  Location: [facility] ENDOSCOPY;  Service: Gastroenterology;  Laterality: N/A;   RIGHT HEART CATH AND CORONARY ANGIOGRAPHY N/A 10/24/2023   Procedure: RIGHT HEART CATH AND CORONARY ANGIOGRAPHY;  Surgeon: [name], MD;  Location: [facility] INVASIVE CV LAB;  Service: Cardiovascular;  Laterality: N/A;   SHOULDER SURGERY     RIGHT SHOULDER SURGERY   THYROIDECTOMY     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   RIGHT   TOTAL KNEE ARTHROPLASTY Left 09/10/2018   Procedure: TOTAL KNEE ARTHROPLASTY;  Surgeon: Dayne Even, MD;  Location: [facility] ORS;  Service: Orthopedics;  Laterality: Left;   FAMILY HISTORY Family History  Problem Relation Age of Onset   Osteoporosis Mother    Heart disease Mother    Macular degeneration Mother    Heart disease Father    Cancer Father        BRAIN TUMOR   Cancer Brother        BRAIN TUMOR   Diabetes Maternal Aunt    Diabetes Maternal Uncle    Stroke Paternal Grandfather    Colon  cancer Neg Hx    Breast cancer Neg Hx    Migraines Neg Hx    SOCIAL HISTORY Social History   Tobacco Use   Smoking status: Never   Smokeless tobacco: Never  Vaping Use   Vaping status: Never Used  Substance Use Topics   Alcohol use: No    Alcohol/week: 0.0 standard drinks of alcohol   Drug use: No       OPHTHALMIC EXAM: Base Eye Exam     Visual Acuity (Snellen - Linear)       Right Left   Dist [place] 20/40 -3 20/40   Dist ph  [place] 20/40 -1 NI         Tonometry (Tonopen, 8:53 AM)       Right Left   Pressure 14 17         Pupils       Pupils Dark Light Shape React APD   Right PERRL 3 2 Round Brisk None   Left PERRL 3 2 Round Brisk None         Visual Fields (Counting fingers)       Left Right    Full Full         Extraocular Movement       Right Left    Full, Ortho Full, Ortho         Neuro/Psych     Oriented x3: Yes   Mood/Affect: Normal         Dilation     Both eyes: 1.0% Mydriacyl, 2.5% Phenylephrine  @ 8:53 AM           Slit Lamp and Fundus Exam     Slit Lamp Exam       Right Left   Lids/Lashes Dermatochalasis - upper lid Dermatochalasis - upper lid   Conjunctiva/Sclera White and quiet White and quiet   Cornea 1+ Punctate epithelial erosions, fine endo pigment, trace tear film debris 1+ fine Punctate epithelial erosions, fine endo pigment, trace tear film debris   Anterior Chamber Deep and quiet Deep and quiet   Iris Round and dilated Round and dilated   Lens Toric PC IOL with marks at 1200 and 0600, 1+ Posterior capsular opacification nasal side approaching visual axis Toric PC IOL with marks at 1200 and 0600, 1+ Posterior capsular opacification   Anterior Vitreous Vitreous syneresis, trace pigment, Posterior vitreous detachment, vitreous condensations Vitreous syneresis, Posterior vitreous detachment, vitreous condensations         Fundus Exam       Right Left   Disc mild Pallor, Sharp rim, +cupping, PPA mild Pallor,  Sharp rim, +cupping, PPA   C/D Ratio 0.7 0.7   Macula Blunted foveal reflex, central CNV with subretinal heme -- stably resolved -- now with pigment clumping, RPE mottling, clumping and atrophy, Drusen, stable improvement in shallow SRF overlying CNV, trace cystic changes temporal mac -- slightly improved Flat, Blunted foveal reflex, Drusen, RPE mottling, clumping and atrophy, focal IRH/SRH inferior to fovea -- stably improved, scattered patches of GA, no heme or fluid   Vessels attenuated, Tortuous attenuated, mild tortuosity   Periphery Attached, reticular degeneration, paving stone degeneration IT quad, No heme Attached, reticular degeneration, mild inferior paving stone degeneration, No heme           IMAGING AND PROCEDURES  Imaging and Procedures for 12/24/2023  OCT, Retina - OU - Both Eyes       Right Eye Quality was good. Central Foveal Thickness: 329. Progression has been stable. Findings include no SRF, abnormal foveal contour, retinal drusen , subretinal hyper-reflective material, intraretinal fluid, pigment epithelial detachment, outer retinal atrophy (persistent PED, persistent IRF / cystic changes, stable improvement in SRF overlying PED, +ORA).   Left Eye Quality was good. Central Foveal Thickness: 231. Progression has worsened. Findings include normal foveal contour, no IRF, no SRF, retinal drusen , outer retinal atrophy (Interval increase in focal central SRHM ).   Notes *Images captured and stored on drive  Diagnosis / Impression:  Ex ARMD OU OD: persistent PED, persistent IRF / cystic changes, stable improvement in SRF overlying PED, +ORA OS: Interval increase in focal  central SRHM   Clinical management:  See below  Abbreviations: NFP - Normal foveal profile. CME - cystoid macular edema. PED - pigment epithelial detachment. IRF - intraretinal fluid. SRF - subretinal fluid. EZ - ellipsoid zone. ERM - epiretinal membrane. ORA - outer retinal atrophy. ORT - outer  retinal tubulation. SRHM - subretinal hyper-reflective material. IRHM - intraretinal hyper-reflective material      Intravitreal Injection, Pharmacologic Agent - OD - Right Eye       Time Out 12/24/2023. 9:53 AM. Confirmed correct patient, procedure, site, and patient consented.   Anesthesia Topical anesthesia was used. Anesthetic medications included Lidocaine  2%, Proparacaine 0.5%.   Procedure Preparation included 5% betadine to ocular surface, eyelid speculum. A supplied (32g) needle was used.   Injection: 6 mg faricimab -svoa 6 MG/0.05ML (Patient supplied)   Route: Intravitreal, Site: Right Eye   NDC: 01027-253-66, Lot: [id number], Expiration date: 04/08/2025, Waste: 0 mL   Post-op Post injection exam found visual acuity of at least counting fingers. The patient tolerated the procedure well. There were no complications. The patient received written and verbal post procedure care education. Post injection medications were not given.   Notes **SAMPLE MEDICATION ADMINISTERED**      Intravitreal Injection, Pharmacologic Agent - OS - Left Eye       Time Out 12/24/2023. 9:58 AM. Confirmed correct patient, procedure, site, and patient consented.   Anesthesia Topical anesthesia was used. Anesthetic medications included Lidocaine  2%, Proparacaine 0.5%.   Procedure Preparation included 5% betadine to ocular surface, eyelid speculum. A supplied (33g) needle was used.   Injection: 1.25 mg Bevacizumab  1.25mg /0.5ml   Route: Intravitreal, Site: Left Eye   NDC: H525437, Lot: [id number], Expiration date: 01/18/2024   Post-op Post injection exam found visual acuity of at least counting fingers. The patient tolerated the procedure well. There were no complications. The patient received written and verbal post procedure care education.             ASSESSMENT/PLAN:   ICD-10-CM   1. Exudative age-related macular degeneration of right eye with active choroidal neovascularization (HCC)   H35.3211 OCT, Retina - OU - Both Eyes    Intravitreal Injection, Pharmacologic Agent - OD - Right Eye    faricimab -svoa (VABYSMO ) 6mg /0.32mL intravitreal injection    2. Exudative age-related macular degeneration of left eye with active choroidal neovascularization (HCC)  H35.3221 Intravitreal Injection, Pharmacologic Agent - OS - Left Eye    Bevacizumab  (AVASTIN ) SOLN 1.25 mg    3. Essential hypertension  I10     4. Hypertensive retinopathy of both eyes  H35.033     5. Pseudophakia, both eyes  Z96.1     6. PCO (posterior capsule opacification), bilateral  H26.493      1. Exudative age related macular degeneration, OD - s/p IVA OD #1 (05.06.22), #2 (06.03.22), #3 (07.01.22), #4 (07.29.22), #5 (08.26.22), #6 (09.23.22), #7 (10.21.22), #8 (11.18.22), #9 (12.16.22), #10 (01.20.23) -- IVA resistance ===================================== - s/p IVE OD #1 (02.24.23) -- sample, #2 (03.24.23), #3 (04.21.23), #4 (05.19.23), #5 (06.16.23), #6 (07.21.23), #7 (09.01.23), #8 (10.27.23), #9 (12.15.23), #10 (02.02.24), #11 (03.29.24), #12 (06.06.24) #13 (07.26.24), #14 (09.13.24), #15 (10.31.24), #16 (12.20.24) -- IVE resistance ====================================== - s/p IVV OD #1 (02.07.25, sample), #2 (03.21.25, sample), #3 (05.02.25, sample)  - OCT shows persistent PED, persistent IRF / cystic changes, stable improvement in SRF overlying PED, +ORA at 6 weeks  - BCVA 20/40 OD -- stable - recommend IVV OD #4 (SAMPLE) today, 06.16.25 w/ f/u extended to 7 wks  -  RBA of procedure discussed, questions answered - see procedure note - IVV informed consent obtained and signed, 02.07.25 (OD)  - f/u 7 wks -- DFE/OCT, possible injection  2. Exudative age related macular degeneration, left eye   - early conversion to exudative ARMD -- noted on 01.04.24 - s/p IVA OS #1 (01.04.24), #2 (02.02.24), #3 (03.01.24), #4 (03.29.24), #5 (05.03.24), #6 (06.06.24), #7 (07.26.24), #8 (09.13.24), #9 (10.31.24), #10  (12.20.24), #11 (02.07.25)  - BCVA OS 20/30 -- stable - OCT shows Interval increase in focal central SRHM at 4 months since last IVA - recommend IVA OS #12 today, 06.16.25 - pt wishes to proceed with injection - RBA of procedure discussed, questions answered - informed consent obtained and signed - see procedure note  - f/u in 7 wks, DFE, OCT, possible injxns  3,4. Hypertensive retinopathy OU - discussed importance of tight BP control - monitor  5. Pseudophakia OU  - s/p CE/IOL  - IOL in good position, doing well  - monitor   6. PCO OU (OD > OS)  - continue to monitor   Ophthalmic Meds Ordered this visit:  Meds ordered this encounter  Medications   Bevacizumab  (AVASTIN ) SOLN 1.25 mg   faricimab -svoa (VABYSMO ) 6mg /0.47mL intravitreal injection     Return in about 7 weeks (around 02/11/2024) for f/u exu ARMD OU, DFE, OCT, Possible Injxn.  There are no Patient Instructions on file for this visit.   This document serves as a record of services personally performed by Jeanice Millard, MD, PhD. It was created on their behalf by Mayola Specking, COA an ophthalmic technician. The creation of this record is the provider's dictation and/or activities during the visit.   Electronically signed by: Carrington Clack, COT  12/28/23  11:33 PM   This document serves as a record of services personally performed by Jeanice Millard, MD, PhD. It was created on their behalf by Morley Arabia. Bevin Bucks, OA an ophthalmic technician. The creation of this record is the provider's dictation and/or activities during the visit.    Electronically signed by: Morley Arabia. Bevin Bucks, OA 12/28/23 11:33 PM  Jeanice Millard, M.D., Ph.D. Diseases & Surgery of the Retina and Vitreous [facility]  I have reviewed the above documentation for accuracy and completeness, and I agree with the above. Jeanice Millard, M.D., Ph.D. 12/28/23 11:35 PM   Abbreviations: M myopia (nearsighted); A astigmatism; H  hyperopia (farsighted); P presbyopia; Mrx spectacle prescription;  CTL contact lenses; OD right eye; OS left eye; OU both eyes  XT exotropia; ET esotropia; PEK punctate epithelial keratitis; PEE punctate epithelial erosions; DES dry eye syndrome; MGD meibomian gland dysfunction; ATs artificial tears; PFAT's preservative free artificial tears; NSC nuclear sclerotic cataract; PSC posterior subcapsular cataract; ERM epi-retinal membrane; PVD posterior vitreous detachment; RD retinal detachment; DM diabetes mellitus; DR diabetic retinopathy; NPDR non-proliferative diabetic retinopathy; PDR proliferative diabetic retinopathy; CSME clinically significant macular edema; DME diabetic macular edema; dbh dot blot hemorrhages; CWS cotton wool spot; POAG primary open angle glaucoma; C/D cup-to-disc ratio; HVF humphrey visual field; GVF goldmann visual field; OCT optical coherence tomography; IOP intraocular pressure; BRVO Branch retinal vein occlusion; CRVO central retinal vein occlusion; CRAO central retinal artery occlusion; BRAO branch retinal artery occlusion; RT retinal tear; SB scleral buckle; PPV pars plana vitrectomy; VH Vitreous hemorrhage; PRP panretinal laser photocoagulation; IVK intravitreal kenalog; VMT vitreomacular traction; MH Macular hole;  NVD neovascularization of the disc; NVE neovascularization elsewhere; AREDS age related eye disease study; ARMD  age related macular degeneration; POAG primary open angle glaucoma; EBMD epithelial/anterior basement membrane dystrophy; ACIOL anterior chamber intraocular lens; IOL intraocular lens; PCIOL posterior chamber intraocular lens; Phaco/IOL phacoemulsification with intraocular lens placement; PRK photorefractive keratectomy; LASIK laser assisted in situ keratomileusis; HTN hypertension; DM diabetes mellitus; COPD chronic obstructive pulmonary disease

## 2023-12-18 DIAGNOSIS — I350 Nonrheumatic aortic (valve) stenosis: Secondary | ICD-10-CM

## 2023-12-20 ENCOUNTER — Ambulatory Visit: Admitting: Family Medicine

## 2023-12-20 ENCOUNTER — Encounter: Payer: Self-pay | Admitting: Family Medicine

## 2023-12-20 VITALS — BP 114/62 | HR 52 | Temp 98.0°F | Ht 62.0 in | Wt 161.6 lb

## 2023-12-20 DIAGNOSIS — K862 Cyst of pancreas: Secondary | ICD-10-CM

## 2023-12-20 DIAGNOSIS — I350 Nonrheumatic aortic (valve) stenosis: Secondary | ICD-10-CM | POA: Diagnosis not present

## 2023-12-20 DIAGNOSIS — D649 Anemia, unspecified: Secondary | ICD-10-CM

## 2023-12-20 LAB — COMPREHENSIVE METABOLIC PANEL WITH GFR
AG Ratio: 1.5 (calc) (ref 1.0–2.5)
ALT: 19 U/L (ref 6–29)
AST: 20 U/L (ref 10–35)
Albumin: 3.4 g/dL — ABNORMAL LOW (ref 3.6–5.1)
Alkaline phosphatase (APISO): 72 U/L (ref 37–153)
BUN/Creatinine Ratio: 15 (calc) (ref 6–22)
BUN: 17 mg/dL (ref 7–25)
CO2: 27 mmol/L (ref 20–32)
Calcium: 8.8 mg/dL (ref 8.6–10.4)
Chloride: 103 mmol/L (ref 98–110)
Creat: 1.17 mg/dL — ABNORMAL HIGH (ref 0.60–0.95)
Globulin: 2.2 g/dL (ref 1.9–3.7)
Glucose, Bld: 137 mg/dL — ABNORMAL HIGH (ref 65–99)
Potassium: 4.2 mmol/L (ref 3.5–5.3)
Sodium: 140 mmol/L (ref 135–146)
Total Bilirubin: 0.6 mg/dL (ref 0.2–1.2)
Total Protein: 5.6 g/dL — ABNORMAL LOW (ref 6.1–8.1)
eGFR: 46 mL/min/{1.73_m2} — ABNORMAL LOW (ref >=60)

## 2023-12-20 LAB — CBC WITH DIFFERENTIAL/PLATELET
Absolute Lymphocytes: 788 {cells}/uL — ABNORMAL LOW (ref 850–3900)
Absolute Monocytes: 759 {cells}/uL (ref 200–950)
Basophils Absolute: 80 {cells}/uL (ref 0–200)
Basophils Relative: 1.1 %
Eosinophils Absolute: 263 {cells}/uL (ref 15–500)
Eosinophils Relative: 3.6 %
HCT: 36.5 % (ref 35.0–45.0)
Hemoglobin: 11.3 g/dL — ABNORMAL LOW (ref 11.7–15.5)
MCH: 27.8 pg (ref 27.0–33.0)
MCHC: 31 g/dL — ABNORMAL LOW (ref 32.0–36.0)
MCV: 89.7 fL (ref 80.0–100.0)
MPV: 11 fL (ref 7.5–12.5)
Monocytes Relative: 10.4 %
Neutro Abs: 5409 {cells}/uL (ref 1500–7800)
Neutrophils Relative %: 74.1 %
Platelets: 357 10*3/uL (ref 140–400)
RBC: 4.07 10*6/uL (ref 3.80–5.10)
RDW: 14.9 % (ref 11.0–15.0)
Total Lymphocyte: 10.8 %
WBC: 7.3 10*3/uL (ref 3.8–10.8)

## 2023-12-20 MED ORDER — VITAMIN B-12 1000 MCG PO TABS: 1000.0000 ug | ORAL_TABLET | ORAL | Status: DC

## 2023-12-20 NOTE — Progress Notes (Signed)
 Inpatient f/u.   Admit Date: 12/10/2023 Discharge date: 12/14/2023   Recommendations for Outpatient Follow-up:  Follow up with PCP in 1-2 weeks Please obtain CMP/CBC in one week Please ensure follow up with cardiology Known incidental finding on CT-pancreatic cyst-radiology recommending-repeat dedicated pancreatic protocol CT/MRI in 1 year.     Brief Summary: Patient is a 83 y.o.  female with history of severe aortic stenosis, PAF, HTN, mild to moderate mitral regurgitation, HLD who presented with fatigue/weakness/exertional dyspnea-she acknowledged black stools intermittently for the past several weeks-she was found to have a hemoglobin of 4.8 and subsequently admitted to the hospitalist service.    Significant events: 6/2>> admit to [facility]   Significant studies: 6/2>> CT abdomen/pelvis: No retroperitoneal hematoma.  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   Procedures: 6/4>> EGD: No bleeding etiology identified 6/4>> colonoscopy: No bleeding etiology identified 6/4>> capsule endoscopy: Prelim-discussed with Dr. Donnamaria Gable bleeding identified.   Consults: GI   Brief Hospital Course: UGI bleed with ABLA Unclear etiology-no bleeding etiology seen on EGD/colonoscopy/capsule endoscopy Hb stable after 4 units of PRBC-last transfused on 6/3 After discussion with GI-Dr. Barkley Boot on 6/5-started on Eliquis -no bleeding overnight-Hb remains stable. Continue close outpatient monitoring.   AKI Hemodynamically mediated due to upper GI bleeding Improved with PRBC/IVF-back to baseline.   Hypokalemia Repleted-recheck in PCPs office in 1 week.   Elevated troponin Likely demand ischemia/type II non-STEMI Per prior documentation-no intervention recommended by cardiology.   Severe aortic stenosis Undergoing TAVR evaluation-tentatively scheduled for 6/10. Discussed with cardiology/TAVR team-per Dr. Thukani-TAVR will likely be postponed for 1-2 weeks to let patient settle down.   Patient will be evaluated by TAVR team prior to discharge from the hospital today.   PAF Metoprolol /amiodarone  Eliquis  initially held-after GI workup-resumed on 6/5-no bleeding issues overnight-Hb remained stable.     HLD Statin   Hypothyroidism Synthroid  Recent TSH 4/1-stable.   Pancreatic cyst-uncinate process Incidental finding Per radiology-better visualized on prior imaging Recommendations from radiology are to repeat dedicated follow-up in 1 year.   Restless leg syndrome Chronic issue per patient At patient's request-started on low-dose Requip -and placed on iron supplementation as well-she thinks her symptoms better controlled.  PCP to slowly increase Requip  dosing over the next several days/weeks. Family requesting oxycodone  or benzos so that she can sleep-but will try Requip  instead.     Discharge Diagnoses:  Principal Problem:   Symptomatic anemia Active Problems:   Atrial fibrillation (HCC)   Upper GI bleed   Iron deficiency anemia due to chronic blood loss   AKI (acute kidney injury) (HCC)   Troponin level elevated    ================ Discussed with patient about inpatient course.  See above.  Recheck pulse 52.  Lightheaded cautions d/w pt.  No black stools but some diarrhea now.    Inpatient course discussed with patient.  Symptomatic anemia requiring transfusion.  She was able to restart Eliquis  in the meantime. Anemia and relationship to RLS d/w pt. Requip  helped with RLS.    No CP.  SOB is clearly better.  No abd pain.    Discussed incidental finding on CT-pancreatic cyst-radiology recommending-repeat dedicated pancreatic protocol CT/MRI in 1 year.  She has cardiology follow-up pending.  Meds, vitals, and allergies reviewed.   ROS: Per HPI unless specifically indicated in ROS section   Nad Nat Neck supple, no LA RRR SEM noted.  Abdomen soft.  Nontender. Extremities well-perfused.  Inpatient labs discussed with patient will recheck labs pending  today.

## 2023-12-20 NOTE — Patient Instructions (Signed)
 Go to the lab on the way out.   If you have mychart we'll likely use that to update you.    If you have more black stools or other concerns then let me know.  Take care.  Glad to see you.

## 2023-12-21 ENCOUNTER — Other Ambulatory Visit: Payer: Self-pay | Admitting: Physician Assistant

## 2023-12-21 ENCOUNTER — Encounter: Payer: Self-pay | Admitting: Physician Assistant

## 2023-12-21 DIAGNOSIS — I350 Nonrheumatic aortic (valve) stenosis: Secondary | ICD-10-CM

## 2023-12-22 DIAGNOSIS — D649 Anemia, unspecified: Secondary | ICD-10-CM | POA: Insufficient documentation

## 2023-12-22 DIAGNOSIS — K862 Cyst of pancreas: Secondary | ICD-10-CM | POA: Insufficient documentation

## 2023-12-22 DIAGNOSIS — D631 Anemia in chronic kidney disease: Secondary | ICD-10-CM | POA: Insufficient documentation

## 2023-12-22 NOTE — Assessment & Plan Note (Signed)
 Discussed incidental finding on CT-pancreatic cyst-radiology recommending-repeat dedicated pancreatic protocol CT/MRI in 1 year.  Benign abdominal exam.

## 2023-12-22 NOTE — Assessment & Plan Note (Signed)
 With cardiology follow-up pending.

## 2023-12-22 NOTE — Assessment & Plan Note (Signed)
 Discussed anemia and RLS symptoms.  Continue Requip  for now.  Recheck labs pending.  Continue Eliquis  for now along with pantoprazole .

## 2023-12-23 ENCOUNTER — Ambulatory Visit: Payer: Self-pay | Admitting: Family Medicine

## 2023-12-24 ENCOUNTER — Encounter (INDEPENDENT_AMBULATORY_CARE_PROVIDER_SITE_OTHER): Payer: Self-pay | Admitting: Ophthalmology

## 2023-12-24 ENCOUNTER — Ambulatory Visit (INDEPENDENT_AMBULATORY_CARE_PROVIDER_SITE_OTHER): Admitting: Ophthalmology

## 2023-12-24 DIAGNOSIS — H26493 Other secondary cataract, bilateral: Secondary | ICD-10-CM

## 2023-12-24 DIAGNOSIS — H353231 Exudative age-related macular degeneration, bilateral, with active choroidal neovascularization: Secondary | ICD-10-CM

## 2023-12-24 DIAGNOSIS — H35033 Hypertensive retinopathy, bilateral: Secondary | ICD-10-CM

## 2023-12-24 DIAGNOSIS — Z961 Presence of intraocular lens: Secondary | ICD-10-CM | POA: Diagnosis not present

## 2023-12-24 DIAGNOSIS — I10 Essential (primary) hypertension: Secondary | ICD-10-CM | POA: Diagnosis not present

## 2023-12-24 DIAGNOSIS — H353221 Exudative age-related macular degeneration, left eye, with active choroidal neovascularization: Secondary | ICD-10-CM

## 2023-12-24 DIAGNOSIS — H353211 Exudative age-related macular degeneration, right eye, with active choroidal neovascularization: Secondary | ICD-10-CM

## 2023-12-24 MED ORDER — FARICIMAB-SVOA 6 MG/0.05ML IZ SOLN
6.0000 mg | INTRAVITREAL | Status: AC | PRN
Start: 2023-12-24 — End: 2023-12-24

## 2023-12-24 MED ORDER — BEVACIZUMAB CHEMO INJECTION 1.25MG/0.05ML SYRINGE FOR KALEIDOSCOPE
1.2500 mg | INTRAVITREAL | Status: AC | PRN
  Administered 2023-12-24: 1.25 mg via INTRAVITREAL

## 2023-12-26 ENCOUNTER — Encounter: Payer: Self-pay | Admitting: Family Medicine

## 2023-12-26 ENCOUNTER — Other Ambulatory Visit: Payer: Self-pay | Admitting: Family Medicine

## 2023-12-26 DIAGNOSIS — D649 Anemia, unspecified: Secondary | ICD-10-CM

## 2023-12-28 ENCOUNTER — Ambulatory Visit (HOSPITAL_COMMUNITY)
Admission: RE | Admit: 2023-12-28 | Discharge: 2023-12-28 | Disposition: A | Discharge status: Home / Self Care | Source: Ambulatory Visit | Attending: Physician Assistant | Admitting: Physician Assistant

## 2023-12-28 ENCOUNTER — Ambulatory Visit: Payer: Self-pay | Admitting: Physician Assistant

## 2023-12-28 ENCOUNTER — Encounter (HOSPITAL_COMMUNITY)
Admission: RE | Admit: 2023-12-28 | Discharge: 2023-12-28 | Disposition: A | Discharge status: Home / Self Care | Source: Ambulatory Visit | Attending: Cardiovascular Disease | Admitting: Cardiovascular Disease

## 2023-12-28 ENCOUNTER — Other Ambulatory Visit: Payer: Self-pay

## 2023-12-28 DIAGNOSIS — I10 Essential (primary) hypertension: Secondary | ICD-10-CM | POA: Diagnosis not present

## 2023-12-28 DIAGNOSIS — I350 Nonrheumatic aortic (valve) stenosis: Secondary | ICD-10-CM | POA: Diagnosis not present

## 2023-12-28 DIAGNOSIS — Z01818 Encounter for other preprocedural examination: Secondary | ICD-10-CM | POA: Diagnosis not present

## 2023-12-28 DIAGNOSIS — R918 Other nonspecific abnormal finding of lung field: Secondary | ICD-10-CM | POA: Diagnosis not present

## 2023-12-28 DIAGNOSIS — E119 Type 2 diabetes mellitus without complications: Secondary | ICD-10-CM | POA: Diagnosis not present

## 2023-12-28 LAB — COMPREHENSIVE METABOLIC PANEL WITH GFR
ALT: 34 U/L (ref 0–44)
AST: 33 U/L (ref 15–41)
Albumin: 3.3 g/dL — ABNORMAL LOW (ref 3.5–5.0)
Alkaline Phosphatase: 69 U/L (ref 38–126)
Anion gap: 9 (ref 5–15)
BUN: 12 mg/dL (ref 8–23)
CO2: 27 mmol/L (ref 22–32)
Calcium: 8.2 mg/dL — ABNORMAL LOW (ref 8.9–10.3)
Chloride: 102 mmol/L (ref 98–111)
Creatinine, Ser: 1.14 mg/dL — ABNORMAL HIGH (ref 0.44–1.00)
GFR, Estimated: 47 mL/min — ABNORMAL LOW (ref >60)
Glucose, Bld: 138 mg/dL — ABNORMAL HIGH (ref 70–99)
Potassium: 3.5 mmol/L (ref 3.5–5.1)
Sodium: 138 mmol/L (ref 135–145)
Total Bilirubin: 0.8 mg/dL (ref 0.0–1.2)
Total Protein: 6.1 g/dL — ABNORMAL LOW (ref 6.5–8.1)

## 2023-12-28 LAB — CBC
HCT: 34.9 % — ABNORMAL LOW (ref 36.0–46.0)
Hemoglobin: 10.9 g/dL — ABNORMAL LOW (ref 12.0–15.0)
MCH: 27.6 pg (ref 26.0–34.0)
MCHC: 31.2 g/dL (ref 30.0–36.0)
MCV: 88.4 fL (ref 80.0–100.0)
Platelets: 409 10*3/uL — ABNORMAL HIGH (ref 150–400)
RBC: 3.95 MIL/uL (ref 3.87–5.11)
RDW: 15.8 % — ABNORMAL HIGH (ref 11.5–15.5)
WBC: 7.4 10*3/uL (ref 4.0–10.5)
nRBC: 0 % (ref 0.0–0.2)

## 2023-12-28 LAB — URINALYSIS, ROUTINE W REFLEX MICROSCOPIC
Bacteria, UA: NONE SEEN
Bilirubin Urine: NEGATIVE
Glucose, UA: NEGATIVE mg/dL
Hgb urine dipstick: NEGATIVE
Ketones, ur: NEGATIVE mg/dL
Leukocytes,Ua: NEGATIVE
Nitrite: NEGATIVE
Protein, ur: 30 mg/dL — AB
Specific Gravity, Urine: 1.015 (ref 1.005–1.030)
pH: 7 (ref 5.0–8.0)

## 2023-12-28 LAB — PROTIME-INR
INR: 1.7 — ABNORMAL HIGH (ref 0.8–1.2)
Prothrombin Time: 19.9 s — ABNORMAL HIGH (ref 11.4–15.2)

## 2023-12-28 LAB — SURGICAL PCR SCREEN
MRSA, PCR: NEGATIVE
Staphylococcus aureus: NEGATIVE

## 2023-12-28 NOTE — Progress Notes (Addendum)
 Patient signed all consents at PAT lab appointment. CHG soap and instructions were given to patient. CHG surgical prep reviewed with patient and all questions answered.  Patients chart send to anesthesia for review. Pt denies any respiratory illness/infection in the last two months.    Pt recently transfused so she will need new T&S on day of surgery

## 2023-12-29 LAB — ANTIBODY SCREEN: Antibody Screen: NEGATIVE

## 2023-12-31 MED ORDER — HEPARIN 30,000 UNITS/1000 ML (OHS) CELLSAVER SOLUTION
Status: DC
  Filled 2023-12-31 (×2): qty 1000

## 2023-12-31 MED ORDER — DEXMEDETOMIDINE HCL IN NACL 400 MCG/100ML IV SOLN
0.1000 ug/kg/h | INTRAVENOUS | Status: AC
  Administered 2024-01-01: 1 ug/kg/h via INTRAVENOUS
  Administered 2024-01-01: 36.64 ug via INTRAVENOUS
  Filled 2023-12-31: qty 100

## 2023-12-31 MED ORDER — NOREPINEPHRINE 4 MG/250ML-% IV SOLN
0.0000 ug/min | INTRAVENOUS | Status: DC
  Filled 2023-12-31: qty 250

## 2023-12-31 MED ORDER — MAGNESIUM SULFATE 50 % IJ SOLN
40.0000 meq | INTRAMUSCULAR | Status: DC
  Filled 2023-12-31 (×2): qty 9.85

## 2023-12-31 MED ORDER — POTASSIUM CHLORIDE 2 MEQ/ML IV SOLN
80.0000 meq | INTRAVENOUS | Status: DC
  Filled 2023-12-31 (×2): qty 40

## 2023-12-31 MED ORDER — CEFAZOLIN SODIUM-DEXTROSE 2-4 GM/100ML-% IV SOLN
2.0000 g | INTRAVENOUS | Status: AC
  Administered 2024-01-01: 2 g via INTRAVENOUS
  Filled 2023-12-31: qty 100

## 2024-01-01 ENCOUNTER — Other Ambulatory Visit (HOSPITAL_COMMUNITY)

## 2024-01-01 ENCOUNTER — Inpatient Hospital Stay (HOSPITAL_COMMUNITY): Payer: Self-pay | Admitting: Physician Assistant

## 2024-01-01 ENCOUNTER — Encounter (HOSPITAL_COMMUNITY): Payer: Self-pay | Admitting: Cardiovascular Disease

## 2024-01-01 ENCOUNTER — Other Ambulatory Visit: Payer: Self-pay

## 2024-01-01 ENCOUNTER — Inpatient Hospital Stay (HOSPITAL_COMMUNITY)

## 2024-01-01 ENCOUNTER — Encounter (HOSPITAL_COMMUNITY)
Admission: RE | Disposition: A | Discharge status: Home / Self Care | Payer: Self-pay | Source: Home / Self Care | Attending: Cardiovascular Disease

## 2024-01-01 ENCOUNTER — Inpatient Hospital Stay (HOSPITAL_COMMUNITY)
Admission: RE | Admit: 2024-01-01 | Discharge: 2024-01-02 | DRG: 267 | Disposition: A | Discharge status: Home / Self Care | Attending: Thoracic Surgery (Cardiothoracic Vascular Surgery) | Admitting: Thoracic Surgery (Cardiothoracic Vascular Surgery)

## 2024-01-01 ENCOUNTER — Inpatient Hospital Stay (HOSPITAL_COMMUNITY): Payer: Self-pay

## 2024-01-01 DIAGNOSIS — I059 Rheumatic mitral valve disease, unspecified: Secondary | ICD-10-CM | POA: Diagnosis not present

## 2024-01-01 DIAGNOSIS — Z7901 Long term (current) use of anticoagulants: Secondary | ICD-10-CM | POA: Diagnosis not present

## 2024-01-01 DIAGNOSIS — Z79899 Other long term (current) drug therapy: Secondary | ICD-10-CM | POA: Diagnosis not present

## 2024-01-01 DIAGNOSIS — E785 Hyperlipidemia, unspecified: Secondary | ICD-10-CM | POA: Diagnosis not present

## 2024-01-01 DIAGNOSIS — H3530 Unspecified macular degeneration: Secondary | ICD-10-CM | POA: Diagnosis present

## 2024-01-01 DIAGNOSIS — E119 Type 2 diabetes mellitus without complications: Secondary | ICD-10-CM | POA: Diagnosis not present

## 2024-01-01 DIAGNOSIS — D62 Acute posthemorrhagic anemia: Secondary | ICD-10-CM | POA: Diagnosis not present

## 2024-01-01 DIAGNOSIS — I350 Nonrheumatic aortic (valve) stenosis: Secondary | ICD-10-CM

## 2024-01-01 DIAGNOSIS — I480 Paroxysmal atrial fibrillation: Secondary | ICD-10-CM | POA: Diagnosis present

## 2024-01-01 DIAGNOSIS — I110 Hypertensive heart disease with heart failure: Secondary | ICD-10-CM | POA: Diagnosis not present

## 2024-01-01 DIAGNOSIS — G459 Transient cerebral ischemic attack, unspecified: Secondary | ICD-10-CM | POA: Diagnosis present

## 2024-01-01 DIAGNOSIS — Z952 Presence of prosthetic heart valve: Secondary | ICD-10-CM | POA: Diagnosis not present

## 2024-01-01 DIAGNOSIS — Z9841 Cataract extraction status, right eye: Secondary | ICD-10-CM

## 2024-01-01 DIAGNOSIS — I272 Pulmonary hypertension, unspecified: Secondary | ICD-10-CM | POA: Diagnosis present

## 2024-01-01 DIAGNOSIS — E669 Obesity, unspecified: Secondary | ICD-10-CM | POA: Diagnosis not present

## 2024-01-01 DIAGNOSIS — Z888 Allergy status to other drugs, medicaments and biological substances status: Secondary | ICD-10-CM

## 2024-01-01 DIAGNOSIS — Z006 Encounter for examination for normal comparison and control in clinical research program: Secondary | ICD-10-CM

## 2024-01-01 DIAGNOSIS — Z8673 Personal history of transient ischemic attack (TIA), and cerebral infarction without residual deficits: Secondary | ICD-10-CM

## 2024-01-01 DIAGNOSIS — I10 Essential (primary) hypertension: Secondary | ICD-10-CM | POA: Diagnosis not present

## 2024-01-01 DIAGNOSIS — Z6829 Body mass index (BMI) 29.0-29.9, adult: Secondary | ICD-10-CM | POA: Diagnosis not present

## 2024-01-01 DIAGNOSIS — E039 Hypothyroidism, unspecified: Secondary | ICD-10-CM

## 2024-01-01 DIAGNOSIS — D631 Anemia in chronic kidney disease: Secondary | ICD-10-CM | POA: Diagnosis present

## 2024-01-01 DIAGNOSIS — Z8262 Family history of osteoporosis: Secondary | ICD-10-CM

## 2024-01-01 DIAGNOSIS — Z79890 Hormone replacement therapy (postmenopausal): Secondary | ICD-10-CM | POA: Diagnosis not present

## 2024-01-01 DIAGNOSIS — I5032 Chronic diastolic (congestive) heart failure: Secondary | ICD-10-CM | POA: Diagnosis not present

## 2024-01-01 DIAGNOSIS — Z833 Family history of diabetes mellitus: Secondary | ICD-10-CM

## 2024-01-01 DIAGNOSIS — Z823 Family history of stroke: Secondary | ICD-10-CM

## 2024-01-01 DIAGNOSIS — I447 Left bundle-branch block, unspecified: Secondary | ICD-10-CM | POA: Diagnosis not present

## 2024-01-01 DIAGNOSIS — Z96652 Presence of left artificial knee joint: Secondary | ICD-10-CM | POA: Diagnosis present

## 2024-01-01 DIAGNOSIS — I4891 Unspecified atrial fibrillation: Secondary | ICD-10-CM

## 2024-01-01 DIAGNOSIS — Z8249 Family history of ischemic heart disease and other diseases of the circulatory system: Secondary | ICD-10-CM

## 2024-01-01 DIAGNOSIS — Z9842 Cataract extraction status, left eye: Secondary | ICD-10-CM

## 2024-01-01 DIAGNOSIS — D136 Benign neoplasm of pancreas: Secondary | ICD-10-CM | POA: Diagnosis present

## 2024-01-01 DIAGNOSIS — K589 Irritable bowel syndrome without diarrhea: Secondary | ICD-10-CM | POA: Diagnosis not present

## 2024-01-01 DIAGNOSIS — Z885 Allergy status to narcotic agent status: Secondary | ICD-10-CM | POA: Diagnosis not present

## 2024-01-01 DIAGNOSIS — D649 Anemia, unspecified: Secondary | ICD-10-CM | POA: Diagnosis present

## 2024-01-01 DIAGNOSIS — D500 Iron deficiency anemia secondary to blood loss (chronic): Secondary | ICD-10-CM | POA: Diagnosis present

## 2024-01-01 HISTORY — PX: INTRAOPERATIVE TRANSTHORACIC ECHOCARDIOGRAM: SHX6523

## 2024-01-01 LAB — POCT I-STAT, CHEM 8
BUN: 11 mg/dL (ref 8–23)
Calcium, Ion: 1.15 mmol/L (ref 1.15–1.40)
Chloride: 100 mmol/L (ref 98–111)
Creatinine, Ser: 1.1 mg/dL — ABNORMAL HIGH (ref 0.44–1.00)
Glucose, Bld: 196 mg/dL — ABNORMAL HIGH (ref 70–99)
HCT: 26 % — ABNORMAL LOW (ref 36.0–46.0)
Hemoglobin: 8.8 g/dL — ABNORMAL LOW (ref 12.0–15.0)
Potassium: 3.4 mmol/L — ABNORMAL LOW (ref 3.5–5.1)
Sodium: 141 mmol/L (ref 135–145)
TCO2: 25 mmol/L (ref 22–32)

## 2024-01-01 LAB — ECHOCARDIOGRAM LIMITED
AR max vel: 0.63 cm2
AV Area VTI: 0.56 cm2
AV Area mean vel: 0.68 cm2
AV Mean grad: 23 mmHg
AV Peak grad: 39.6 mmHg
Ao pk vel: 3.15 m/s
MV VTI: 0.86 cm2

## 2024-01-01 LAB — TYPE AND SCREEN
ABO/RH(D): A POS
Antibody Screen: NEGATIVE

## 2024-01-01 LAB — GLUCOSE, CAPILLARY
Glucose-Capillary: 144 mg/dL — ABNORMAL HIGH (ref 70–99)
Glucose-Capillary: 117 mg/dL — ABNORMAL HIGH (ref 70–99)

## 2024-01-01 LAB — POCT ACTIVATED CLOTTING TIME: Activated Clotting Time: 302 s

## 2024-01-01 SURGERY — TRANSCATHETER AORTIC VALVE REPLACEMENT, TRANSFEMORAL (CATHLAB)
Anesthesia: Monitor Anesthesia Care

## 2024-01-01 MED ORDER — AMIODARONE HCL 100 MG PO TABS
100.0000 mg | ORAL_TABLET | Freq: Every day | ORAL | Status: DC
  Administered 2024-01-01 – 2024-01-02 (×2): 100 mg via ORAL
  Filled 2024-01-01 (×2): qty 1

## 2024-01-01 MED ORDER — NITROGLYCERIN IN D5W 200-5 MCG/ML-% IV SOLN: 0.0000 ug/min | INTRAVENOUS | Status: DC

## 2024-01-01 MED ORDER — LIDOCAINE HCL (PF) 1 % IJ SOLN
INTRAMUSCULAR | Status: AC
  Filled 2024-01-01: qty 30

## 2024-01-01 MED ORDER — CHLORHEXIDINE GLUCONATE 0.12 % MT SOLN: 15.0000 mL | Freq: Once | OROMUCOSAL | Status: AC

## 2024-01-01 MED ORDER — LACTATED RINGERS IV SOLN: INTRAVENOUS | Status: DC

## 2024-01-01 MED ORDER — HEPARIN (PORCINE) IN NACL 1000-0.9 UT/500ML-% IV SOLN
INTRAVENOUS | Status: DC | PRN
  Administered 2024-01-01: 1500 mL via SURGICAL_CAVITY

## 2024-01-01 MED ORDER — FENTANYL CITRATE (PF) 100 MCG/2ML IJ SOLN
INTRAMUSCULAR | Status: DC | PRN
  Administered 2024-01-01 (×2): 25 ug via INTRAVENOUS

## 2024-01-01 MED ORDER — CHLORHEXIDINE GLUCONATE 4 % EX SOLN
60.0000 mL | Freq: Once | CUTANEOUS | Status: DC
  Filled 2024-01-01: qty 60

## 2024-01-01 MED ORDER — INSULIN ASPART 100 UNIT/ML IJ SOLN: 0.0000 [IU] | INTRAMUSCULAR | Status: DC | PRN

## 2024-01-01 MED ORDER — NOREPINEPHRINE 4 MG/250ML-% IV SOLN
0.0000 ug/min | INTRAVENOUS | Status: DC
  Filled 2024-01-01: qty 250

## 2024-01-01 MED ORDER — IODIXANOL 320 MG/ML IV SOLN
INTRAVENOUS | Status: DC | PRN
  Administered 2024-01-01: 50 mL via INTRA_ARTERIAL

## 2024-01-01 MED ORDER — LIDOCAINE HCL (PF) 1 % IJ SOLN
INTRAMUSCULAR | Status: DC | PRN
  Administered 2024-01-01: 20 mL

## 2024-01-01 MED ORDER — SODIUM CHLORIDE 0.9 % IV SOLN: 250.0000 mL | INTRAVENOUS | Status: DC | PRN

## 2024-01-01 MED ORDER — ACETAMINOPHEN 325 MG PO TABS: 650.0000 mg | ORAL_TABLET | Freq: Four times a day (QID) | ORAL | Status: DC | PRN

## 2024-01-01 MED ORDER — ATORVASTATIN CALCIUM 40 MG PO TABS
40.0000 mg | ORAL_TABLET | Freq: Every day | ORAL | Status: DC
Start: 2024-01-01 — End: 2024-01-02
  Administered 2024-01-01: 40 mg via ORAL
  Filled 2024-01-01 (×2): qty 1

## 2024-01-01 MED ORDER — PANTOPRAZOLE SODIUM 40 MG PO TBEC
40.0000 mg | DELAYED_RELEASE_TABLET | Freq: Every day | ORAL | Status: DC
  Administered 2024-01-01 – 2024-01-02 (×2): 40 mg via ORAL
  Filled 2024-01-01 (×2): qty 1

## 2024-01-01 MED ORDER — HEPARIN SODIUM (PORCINE) 1000 UNIT/ML IJ SOLN
INTRAMUSCULAR | Status: DC | PRN
  Administered 2024-01-01: 13000 [IU] via INTRAVENOUS

## 2024-01-01 MED ORDER — FERROUS SULFATE 325 (65 FE) MG PO TABS
325.0000 mg | ORAL_TABLET | Freq: Two times a day (BID) | ORAL | Status: DC
  Administered 2024-01-01 – 2024-01-02 (×2): 325 mg via ORAL
  Filled 2024-01-01 (×2): qty 1

## 2024-01-01 MED ORDER — LEVOTHYROXINE SODIUM 88 MCG PO TABS
88.0000 ug | ORAL_TABLET | Freq: Every day | ORAL | Status: DC
  Administered 2024-01-02: 88 ug via ORAL
  Filled 2024-01-01: qty 1

## 2024-01-01 MED ORDER — FENTANYL CITRATE (PF) 100 MCG/2ML IJ SOLN
INTRAMUSCULAR | Status: AC
Start: 2024-01-01 — End: 2024-01-01
  Filled 2024-01-01: qty 2

## 2024-01-01 MED ORDER — SODIUM CHLORIDE 0.9 % IV SOLN: INTRAVENOUS | Status: DC

## 2024-01-01 MED ORDER — OXYCODONE HCL 5 MG PO TABS: 5.0000 mg | ORAL_TABLET | ORAL | Status: DC | PRN

## 2024-01-01 MED ORDER — CHLORHEXIDINE GLUCONATE 4 % EX SOLN
30.0000 mL | CUTANEOUS | Status: DC
  Filled 2024-01-01: qty 30

## 2024-01-01 MED ORDER — SODIUM CHLORIDE 0.9 % IV SOLN: INTRAVENOUS | Status: AC

## 2024-01-01 MED ORDER — SODIUM CHLORIDE 0.9% FLUSH: 3.0000 mL | INTRAVENOUS | Status: DC | PRN

## 2024-01-01 MED ORDER — CHLORHEXIDINE GLUCONATE 4 % EX SOLN: 60.0000 mL | Freq: Once | CUTANEOUS | Status: DC

## 2024-01-01 MED ORDER — ONDANSETRON HCL 4 MG/2ML IJ SOLN: 4.0000 mg | Freq: Four times a day (QID) | INTRAMUSCULAR | Status: DC | PRN

## 2024-01-01 MED ORDER — CEFAZOLIN SODIUM-DEXTROSE 2-4 GM/100ML-% IV SOLN
2.0000 g | Freq: Three times a day (TID) | INTRAVENOUS | Status: AC
  Administered 2024-01-01 (×2): 2 g via INTRAVENOUS
  Filled 2024-01-01 (×2): qty 100

## 2024-01-01 MED ORDER — ACETAMINOPHEN 650 MG RE SUPP
650.0000 mg | Freq: Four times a day (QID) | RECTAL | Status: DC | PRN
  Filled 2024-01-01: qty 1

## 2024-01-01 MED ORDER — ROPINIROLE HCL 0.25 MG PO TABS
0.2500 mg | ORAL_TABLET | Freq: Every day | ORAL | Status: DC
  Administered 2024-01-01: 0.25 mg via ORAL
  Filled 2024-01-01 (×2): qty 1

## 2024-01-01 MED ORDER — PROTAMINE SULFATE 10 MG/ML IV SOLN
INTRAVENOUS | Status: DC | PRN
  Administered 2024-01-01: 10 mg via INTRAVENOUS
  Administered 2024-01-01: 40 mg via INTRAVENOUS

## 2024-01-01 MED ORDER — SODIUM CHLORIDE 0.9% FLUSH
3.0000 mL | Freq: Two times a day (BID) | INTRAVENOUS | Status: DC
  Administered 2024-01-01 – 2024-01-02 (×2): 3 mL via INTRAVENOUS

## 2024-01-01 MED ORDER — CHLORHEXIDINE GLUCONATE 0.12 % MT SOLN
OROMUCOSAL | Status: AC
  Administered 2024-01-01: 15 mL via OROMUCOSAL
  Filled 2024-01-01: qty 15

## 2024-01-01 SURGICAL SUPPLY — 32 items
BAG SNAP BAND KOVER 36X36 (MISCELLANEOUS) ×2 IMPLANT
CABLE ADAPT PACING TEMP 12FT (ADAPTER) IMPLANT
CATH 23 ULTRA DELIVERY (CATHETERS) IMPLANT
CATH DIAG 6FR PIGTAIL ANGLED (CATHETERS) IMPLANT
CATH INFINITI 5FR ANG PIGTAIL (CATHETERS) IMPLANT
CATH INFINITI 6F AL1 (CATHETERS) IMPLANT
CATH S G BIP PACING (CATHETERS) IMPLANT
CLOSURE MYNX CONTROL 6F/7F (Vascular Products) IMPLANT
CLOSURE PERCLOSE PROSTYLE (VASCULAR PRODUCTS) IMPLANT
CRIMPER (MISCELLANEOUS) IMPLANT
DEVICE INFLATION ATRION QL2530 (MISCELLANEOUS) IMPLANT
ELECT DEFIB PAD ADLT CADENCE (PAD) IMPLANT
HEMOSTAT KELLY 5.5 SS STRL (MISCELLANEOUS) IMPLANT
INTRODUCER 7FR 23CM (INTRODUCER) IMPLANT
KIT MICROPUNCTURE NIT STIFF (SHEATH) IMPLANT
KIT SAPIAN 3 ULTRA RESILIA 23 (Valve) IMPLANT
PACK CARDIAC CATHETERIZATION (CUSTOM PROCEDURE TRAY) ×1 IMPLANT
PAD SORBX EP SHIELD 16.5X12 (MISCELLANEOUS) IMPLANT
SET ATX-X65L (MISCELLANEOUS) IMPLANT
SHEATH INTRODUCER SET 20-26 (SHEATH) IMPLANT
SHEATH PINNACLE 6F 10CM (SHEATH) IMPLANT
SHEATH PINNACLE 8F 10CM (SHEATH) IMPLANT
SHEATH PROBE COVER 6X72 (BAG) IMPLANT
SHIELD CATH-GARD CONTAMINATION (MISCELLANEOUS) IMPLANT
STOPCOCK MORSE 400PSI 3WAY (MISCELLANEOUS) ×2 IMPLANT
TRANSDUCER W/STOPCOCK (MISCELLANEOUS) IMPLANT
TUBING ART PRESS 72 MALE/FEM (TUBING) IMPLANT
WIRE AMPLATZ SS-J .035X180CM (WIRE) IMPLANT
WIRE EMERALD 3MM-J .035X150CM (WIRE) IMPLANT
WIRE EMERALD 3MM-J .035X260CM (WIRE) IMPLANT
WIRE EMERALD ST .035X260CM (WIRE) IMPLANT
WIRE SAFARI SM CURVE 275 (WIRE) IMPLANT

## 2024-01-01 NOTE — Progress Notes (Signed)
Pt arrived from [facility], A/ox .4.Marland Kitchenpt denies any pain, MD aware,CCMD called. CHG bath given,no further needs at this time

## 2024-01-01 NOTE — Op Note (Signed)
 [facility]   MULTIDISCIPLINARY HEART VALVE TEAM   TAVR OPERATIVE NOTE   Date of Procedure:  01/01/2024  Preoperative Diagnosis: Severe Aortic Stenosis   Postoperative Diagnosis: Same   Procedure:   Transcatheter Aortic Valve Replacement - Percutaneous  Transfemoral Approach  Edwards Sapien 3 Ultra Resilia THV (size 23 mm, serial # [id number])   Co-Surgeons:  Linnie Rayas, MD and Ozell Fell, MD  Anesthesiologist:  Lynwood Cornea, MD  Echocardiographer:  Stanly Leavens, MD  Pre-operative Echo Findings: Severe aortic stenosis Normal left ventricular systolic function  Post-operative Echo Findings: No paravalvular leak Normal/unchanged left ventricular systolic function  BRIEF CLINICAL NOTE AND INDICATIONS FOR SURGERY  83 year old woman who has developed severe, stage D1 aortic stenosis with NYHA functional class III limitation from exertional dyspnea.  She has undergone multimodality imaging studies and multidisciplinary heart team review and discussion of treatment options.  She presents today for TAVR via a transfemoral approach.  During the course of the patient's preoperative work up they have been evaluated comprehensively by a multidisciplinary team of specialists coordinated through the Multidisciplinary [facility] Valve Clinic in the [facility].  They have been demonstrated to suffer from symptomatic severe aortic stenosis as noted above. The patient has been counseled extensively as to the relative risks and benefits of all options for the treatment of severe aortic stenosis including long term medical therapy, conventional surgery for aortic valve replacement, and transcatheter aortic valve replacement.  The patient has been independently evaluated in formal cardiac surgical consultation by Dr Rayas, who deemed the patient appropriate for TAVR. Based upon review of all of the patient's preoperative diagnostic tests they are  felt to be candidate for transcatheter aortic valve replacement using the transfemoral approach as an alternative to conventional surgery.    Following the decision to proceed with transcatheter aortic valve replacement, a discussion has been held regarding what types of management strategies would be attempted intraoperatively in the event of life-threatening complications, including whether or not the patient would be considered a candidate for the use of cardiopulmonary bypass and/or conversion to open sternotomy for attempted surgical intervention.  The patient has been advised of a variety of complications that might develop peculiar to this approach including but not limited to risks of death, stroke, paravalvular leak, aortic dissection or other major vascular complications, aortic annulus rupture, device embolization, cardiac rupture or perforation, acute myocardial infarction, arrhythmia, heart block or bradycardia requiring permanent pacemaker placement, congestive heart failure, respiratory failure, renal failure, pneumonia, infection, other late complications related to structural valve deterioration or migration, or other complications that might ultimately cause a temporary or permanent loss of functional independence or other long term morbidity.  The patient provides full informed consent for the procedure as described and all questions were answered preoperatively.  DETAILS OF THE OPERATIVE PROCEDURE  PREPARATION:   The patient is brought to the operating room on the above mentioned date. The patient is placed in the supine position on the operating table.  Intravenous antibiotics are administered. The patient is monitored closely throughout the procedure under conscious sedation.  Baseline transthoracic echocardiogram is performed. The patient's chest, abdomen, both groins, and both lower extremities are prepared and draped in a sterile manner. A time out procedure is  performed.   PERIPHERAL ACCESS:   Using ultrasound guidance, femoral arterial and venous access is obtained with placement of 6 Fr sheaths on the left side.  US  images are digitally captured and stored in the patient's  chart. A pigtail diagnostic catheter was passed through the femoral arterial sheath under fluoroscopic guidance into the aortic root.  A temporary transvenous pacemaker catheter was passed through the femoral venous sheath under fluoroscopic guidance into the right ventricle.  The pacemaker was tested to ensure stable lead placement and pacemaker capture. Aortic root angiography was performed in order to determine the optimal angiographic angle for valve deployment.  TRANSFEMORAL ACCESS:  A micropuncture technique is used to access the right femoral artery under fluoroscopic and ultrasound guidance.  2 Perclose devices are deployed at 10' and 2' positions to 'PreClose' the femoral artery. An 8 French sheath is placed and then an Amplatz Superstiff wire is advanced through the sheath. This is changed out for a 14 French transfemoral E-Sheath after progressively dilating over the Superstiff wire.  An AL-1 catheter was used to direct a straight-tip exchange length wire across the native aortic valve into the left ventricle. This was exchanged out for a pigtail catheter and position was confirmed in the LV apex. Simultaneous LV and Ao pressures were recorded.  The pigtail catheter was exchanged for a Safari wire in the LV apex.    BALLOON AORTIC VALVULOPLASTY:  Not performed  TRANSCATHETER HEART VALVE DEPLOYMENT:  An Edwards Sapien 3 Ultra Resilia transcatheter heart valve (size 23 mm) was prepared and crimped per manufacturer's guidelines, and the proper orientation of the valve is confirmed on the [employer] delivery system. The valve was advanced through the introducer sheath using normal technique until in an appropriate position in the abdominal aorta beyond the sheath tip. The  balloon was then retracted and using the fine-tuning wheel was centered on the valve. The valve was then advanced across the aortic arch using appropriate flexion of the catheter. The valve was carefully positioned across the aortic valve annulus. The Commander catheter was retracted using normal technique. Once final position of the valve has been confirmed by angiographic assessment, the valve is deployed while temporarily holding ventilation and during rapid ventricular pacing to maintain systolic blood pressure < 50 mmHg and pulse pressure < 10 mmHg. The balloon inflation is held for >3 seconds after reaching full deployment volume. Once the balloon has fully deflated the balloon is retracted into the ascending aorta and valve function is assessed using echocardiography. The patient's hemodynamic recovery following valve deployment is good.  The deployment balloon and guidewire are both removed. Echo demostrated acceptable post-procedural gradients, stable mitral valve function, and no aortic insufficiency.    PROCEDURE COMPLETION:  The sheath was removed and femoral artery closure is performed using the 2 previously deployed Perclose devices.  Protamine is administered once femoral arterial repair was complete. The site is clear with no evidence of bleeding or hematoma after the sutures are tightened. The temporary pacemaker and pigtail catheters are removed. Mynx closure  is used for contralateral femoral arterial hemostasis for the 6 Fr sheath.  The patient tolerated the procedure well and is transported to the recovery area in stable condition. There were no immediate intraoperative complications. All sponge instrument and needle counts are verified correct at completion of the operation.   The patient received a total of 50 mL of intravenous contrast during the procedure.  EBL: minimal   Ozell Fell, MD 01/01/2024 5:00 PM

## 2024-01-01 NOTE — Op Note (Incomplete)
 Signed       [facility]   MULTIDISCIPLINARY HEART VALVE TEAM     TAVR OPERATIVE NOTE     Date of Procedure:                01/01/2024   Preoperative Diagnosis:      Severe Aortic Stenosis    Postoperative Diagnosis:    Same    Procedure:        Transcatheter Aortic Valve Replacement - Percutaneous *** Transfemoral Approach             ***              Co-Surgeons:                        Linnie Rayas, MD and ***, MD   Anesthesiologist:                  General   Echocardiographer:              ***, MD   Pre-operative Echo Findings: Severe aortic stenosis normal left ventricular systolic function ***   Post-operative Echo Findings: *** paravalvular leak normal left ventricular systolic function ***     BRIEF CLINICAL NOTE AND INDICATIONS FOR SURGERY   ***       DETAILS OF THE OPERATIVE PROCEDURE   PREPARATION:     The patient was brought to the operating room on the above mentioned date and appropriate monitoring was established by the anesthesia team. The patient was placed in the supine position on the operating table.  Intravenous antibiotics were administered. The patient was monitored closely throughout the procedure under conscious sedation.   Baseline transthoracic echocardiogram was performed. The patient's abdomen and both groins were prepped and draped in a sterile manner. A time out procedure was performed.     PERIPHERAL ACCESS:     Using the modified Seldinger technique, femoral arterial was obtained with placement of 6 Fr sheath on the *** side.  A pigtail diagnostic catheter was passed through the arterial sheath under fluoroscopic guidance into the aortic root. Venous access was from the ***vein.  A temporary transvenous pacemaker catheter was passed through the venous sheath under fluoroscopic guidance into the right ventricle.  The pacemaker was tested to ensure stable lead placement and pacemaker capture. Aortic root  angiography was performed in order to determine the optimal angiographic angle for valve deployment.     TRANSFEMORAL ACCESS:    Percutaneous transfemoral access and sheath placement was performed using ultrasound guidance.  The *** common femoral artery was cannulated using a micropuncture needle and appropriate location was verified using hand injection angiogram.  A pair of Abbott Perclose percutaneous closure devices were placed and a 6 French sheath replaced into the femoral artery.  The patient was heparinized systemically and ACT verified > 250 seconds.     A 14 Fr transfemoral E-sheath was introduced into the *** common femoral artery after progressively dilating over an Amplatz superstiff wire. An pigtail catheter was used to direct a straight-tip exchange length wire across the native aortic valve into the left ventricle. This was exchanged out for a pigtail catheter and position was confirmed in the LV apex. Simultaneous LV and Ao pressures were recorded.  The pigtail catheter was exchanged for a Safari wire in the LV apex.         TRANSCATHETER HEART VALVE DEPLOYMENT:    An ***  transcatheter heart valve (  size *** mm) was prepared and crimped per manufacturer's guidelines, and the proper orientation of the valve is confirmed on the [employer] delivery system. The valve was advanced through the introducer sheath using normal technique until in an appropriate position in the abdominal aorta beyond the sheath tip. The balloon was then retracted and using the fine-tuning wheel was centered on the valve. The valve was then advanced across the aortic arch using appropriate flexion of the catheter. The valve was carefully positioned across the aortic valve annulus. The Commander catheter was retracted using normal technique. Once final position of the valve has been confirmed by angiographic assessment, the valve is deployed during rapid ventricular pacing to maintain systolic blood  pressure < 50 mmHg and pulse pressure < 10 mmHg. The balloon inflation is held for >3 seconds after reaching full deployment volume. Once the balloon has fully deflated the balloon is retracted into the ascending aorta and valve function is assessed using echocardiography. ***There is felt to be trace paravalvular leak and no central aortic insufficiency.  The patient's hemodynamic recovery following valve deployment is good.  The deployment balloon and guidewire are both removed.      PROCEDURE COMPLETION:    The sheath was removed and femoral artery closure performed.  Protamine was administered once femoral arterial repair was complete. The temporary pacemaker***, pigtail catheter and femoral sheaths were removed with manual pressure used for venous hemostasis.  ***A Mynx femoral closure device was utilized following removal of the diagnostic sheath in the *** femoral artery.   The patient tolerated the procedure well and is transported to the cath lab recovery area in stable condition. There were no immediate intraoperative complications. All sponge instrument and needle counts are verified correct at completion of the operation.    No blood products were administered during the operation.   The patient received a total of *** mL of intravenous contrast during the procedure.

## 2024-01-01 NOTE — Anesthesia Postprocedure Evaluation (Signed)
 Anesthesia Post Note  Patient: Dana Sandoval  Procedure(s) Performed: Transcatheter Aortic Valve Replacement, Transfemoral ECHOCARDIOGRAM, TRANSTHORACIC     Patient location during evaluation: PACU Anesthesia Type: MAC Level of consciousness: awake and alert Pain management: pain level controlled Vital Signs Assessment: post-procedure vital signs reviewed and stable Respiratory status: spontaneous breathing, nonlabored ventilation, respiratory function stable and patient connected to nasal cannula oxygen Cardiovascular status: stable and blood pressure returned to baseline Postop Assessment: no apparent nausea or vomiting Anesthetic complications: no   There were no known notable events for this encounter.  Last Vitals:  Vitals:   01/01/24 1215 01/01/24 1250  BP:  133/61  Pulse:  (!) 54  Resp:  14  Temp: (!) (P) 35.6 C 36.7 C  SpO2:  96%    Last Pain:  Vitals:   01/01/24 1250  TempSrc: Oral  PainSc:                  [name]

## 2024-01-01 NOTE — Anesthesia Preprocedure Evaluation (Addendum)
 Anesthesia Evaluation  Patient identified by MRN, date of birth, ID band Patient awake    Reviewed: Allergy & Precautions, NPO status , Patient's Chart, lab work & pertinent test results, reviewed documented beta blocker date and time   History of Anesthesia Complications Negative for: history of anesthetic complications  Airway Mallampati: II  TM Distance: >3 FB     Dental no notable dental hx.    Pulmonary neg shortness of breath, neg COPD   breath sounds clear to auscultation       Cardiovascular hypertension, + Valvular Problems/Murmurs AS and MR  Rhythm:Regular Rate:Normal + Systolic murmurs IMPRESSIONS     1. Left ventricular ejection fraction, by estimation, is 60 to 65%. The  left ventricle has normal function. The left ventricle has no regional  wall motion abnormalities. There is mild left ventricular hypertrophy of  the basal-septal segment. Left  ventricular diastolic parameters are indeterminate. Elevated left  ventricular end-diastolic pressure. The average left ventricular global  longitudinal strain is -16.0 %. The global longitudinal strain is  abnormal.   2. Right ventricular systolic function is normal. The right ventricular  size is normal. There is moderately elevated pulmonary artery systolic  pressure. The estimated right ventricular systolic pressure is 51.4 mmHg.   3. The mitral valve is degenerative. Mild to moderate mitral valve  regurgitation. Mild mitral stenosis. The mean mitral valve gradient is 4.5  mmHg. Severe mitral annular calcification.   4. The aortic valve is calcified. There is severe calcifcation of the  aortic valve. There is severe thickening of the aortic valve. Aortic valve  regurgitation is trivial. Severe aortic valve stenosis. Aortic valve area,  by VTI measures 0.41 cm. Aortic  valve mean gradient measures 48.0 mmHg. Aortic valve Vmax measures 4.41  m/s.   5. The inferior  vena cava is normal in size with greater than 50%  respiratory variability, suggesting right atrial pressure of 3 mmHg.   6. Compared to study dated 09/29/21, the mean AVG has increased from 25 to  , VMax has increased from 3.42 to 4.37m/s and DVI has decreased from  0.3 to 0.18.     Neuro/Psych  Headaches TIA Neuromuscular disease    GI/Hepatic ,neg GERD  ,,(+) neg Cirrhosis        Endo/Other  diabetesHypothyroidism    Renal/GU CRFRenal disease     Musculoskeletal  (+) Arthritis ,    Abdominal   Peds  Hematology  (+) Blood dyscrasia, anemia   Anesthesia Other Findings   Reproductive/Obstetrics                             Anesthesia Physical Anesthesia Plan  ASA: 4  Anesthesia Plan: MAC   Post-op Pain Management:    Induction: Intravenous  PONV Risk Score and Plan: 2 and Ondansetron  and Propofol  infusion  Airway Management Planned: Natural Airway and Simple Face Mask  Additional Equipment:   Intra-op Plan:   Post-operative Plan:   Informed Consent: I have reviewed the patients History and Physical, chart, labs and discussed the procedure including the risks, benefits and alternatives for the proposed anesthesia with the patient or authorized representative who has indicated his/her understanding and acceptance.     Dental advisory given  Plan Discussed with: CRNA  Anesthesia Plan Comments:         Anesthesia Quick Evaluation

## 2024-01-01 NOTE — Transfer of Care (Deleted)
 Immediate Anesthesia Transfer of Care Note  Patient: Dana Sandoval  Procedure(s) Performed: Transcatheter Aortic Valve Replacement, Transfemoral ECHOCARDIOGRAM, TRANSTHORACIC  Patient Location: PACU  Anesthesia Type:General  Level of Consciousness: awake, alert , and oriented  Airway & Oxygen Therapy: Patient Spontanous Breathing  Post-op Assessment: Report given to RN and Post -op Vital signs reviewed and stable  Post vital signs: Reviewed and stable  Last Vitals:  Vitals Value Taken Time  BP    Temp    Pulse    Resp    SpO2      Last Pain:  Vitals:   01/01/24 0809  TempSrc:   PainSc: 0-No pain         Complications: No notable events documented.

## 2024-01-01 NOTE — Interval H&P Note (Signed)
 History and Physical Interval Note:  01/01/2024 9:33 AM  Dana Sandoval  has presented today for surgery, with the diagnosis of Severe Aortic Stenosis.  The various methods of treatment have been discussed with the patient and family. After consideration of risks, benefits and other options for treatment, the patient has consented to  Procedure(s): Transcatheter Aortic Valve Replacement, Transfemoral (N/A) ECHOCARDIOGRAM, TRANSTHORACIC (N/A) as a surgical intervention.  The patient's history has been reviewed, patient examined, no change in status, stable for surgery.  I have reviewed the patient's chart and labs.  Questions were answered to the patient's satisfaction.     [name] MALVA Rayas

## 2024-01-01 NOTE — Transfer of Care (Signed)
 Immediate Anesthesia Transfer of Care Note  Patient: Dana Sandoval  Procedure(s) Performed: Transcatheter Aortic Valve Replacement, Transfemoral ECHOCARDIOGRAM, TRANSTHORACIC  Patient Location: PACU  Anesthesia Type:MAC  Level of Consciousness: oriented, drowsy, patient cooperative, and responds to stimulation  Airway & Oxygen Therapy: Patient Spontanous Breathing and Patient connected to nasal cannula oxygen  Post-op Assessment: Report given to RN and Post -op Vital signs reviewed and stable  Post vital signs: Reviewed and stable  Last Vitals:  Vitals Value Taken Time  BP 141/57 1119  Temp    Pulse 59 01/01/24 11:19  Resp 16 01/01/24 11:19  SpO2 91 % 01/01/24 11:19  Vitals shown include unfiled device data.  Last Pain:  Vitals:   01/01/24 1105  TempSrc:   PainSc: 0-No pain         Complications: There were no known notable events for this encounter.

## 2024-01-01 NOTE — Progress Notes (Signed)
 Dr Keneth made aware of patient's temperature - 96.0 oral.  Patient asymptomatic, no new orders at this time.

## 2024-01-01 NOTE — Discharge Summary (Incomplete)
 [facility]ENTER   MULTIDISCIPLINARY HEART VALVE TEAM  Discharge Summary    Patient ID: Dana Sandoval MRN: [id number]; DOB: 1941-01-04  Admit date: 01/01/2024 Discharge date: 01/02/2024  PCP:  [name], MD  [facility] Cardiologist:  Gordy Bergamo, MD  [facility] Structural heart: Ozell Fell, MD [facility] Electrophysiologist:  None   Discharge Diagnoses    Principal Problem:   S/P TAVR (transcatheter aortic valve replacement) Active Problems:   Dyslipidemia   Controlled type 2 diabetes mellitus without complication, without long-term current use of insulin  (HCC)   Acquired hypothyroidism   Essential hypertension   TIA (transient ischemic attack)   Obesity (BMI 30-39.9)   Iron deficiency anemia due to chronic blood loss   Severe aortic stenosis   Allergies Allergies  Allergen Reactions   Codeine Itching    After 3 days   Dilaudid [Hydromorphone Hcl] Itching    After 3 days    Diagnostic Studies/Procedures    TAVR OPERATIVE NOTE     Date of Procedure:                01/01/2024   Preoperative Diagnosis:      Severe Aortic Stenosis    Postoperative Diagnosis:    Same    Procedure:        Transcatheter Aortic Valve Replacement - Percutaneous  Transfemoral Approach             Edwards Sapien 3 Ultra Resilia THV (size 23 mm, serial # [id number])              Co-Surgeons:                        Linnie Rayas, MD and Ozell Fell, MD   Anesthesiologist:                  Lynwood Cornea, MD   Echocardiographer:              Stanly Leavens, MD   Pre-operative Echo Findings: Severe aortic stenosis Normal left ventricular systolic function   Post-operative Echo Findings: No paravalvular leak Normal/unchanged left ventricular systolic function  _____________    Echo 01/02/24: completed but pending formal read at the time of discharge   History of Present Illness     Dana Sandoval is a 83 y.o. 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   Echo 10/11/23 showed EF 60%, mild left ventricular hypertrophy of the basal-septal segment, elevated LVEDP, RSVP 51.4 mm hg, severe MAC with mild MS/mod MR, and severe AS with mean grad 48 mmHg AVA 0.40 cm2. She underwent cardiac catheterization on 10/24/23 which demonstrated no significant obstructive disease. Of note, her filling pressures were elevated with a mean RA of 14 mmHg mean PA of 44 mmHg, mean wedge pressure of 33 mmHg with V waves to 54 mmHg and a PVR consistent with pulmonary venous hypertension. She was started on Lasix  20mg  BID. She was then admitted 6/2-12/14/23 with acute anemia with Hg down to 4.8. FOBT +. She was transfused and underwent extensive GI work up including EGD, colonoscopy and capsule endoscopy which were unremarkable. She was restarted on her Eliqius and Hg has remained stable.   The patient was  evaluated by the multidisciplinary valve team and felt to have severe, symptomatic aortic stenosis and to be a suitable candidate for TAVR, which was set up for 01/01/24.   Hospital Course     Consultants: none   Severe AS:  -- S/p successful TAVR with a 23 mm Edwards Sapien 3 Ultra Resilia THV via the TF approach on 01/01/24.  -- Post operative echo completed but pending formal read. -- Groin sites are stable.  -- Resume Eliquis  5 mg twice daily. -- Met with cardiac rehab to discuss CRP phase II.  -- SBE prophylaxis discussed; I have RX'd amoxicillin. -- Plan for discharge home today with close follow up in the outpatient setting. (Has an apt with Dr. Ladona on 7/10. I will see back on 8/7 with an echo and office visit).  New LBBB:  -- Initial post op ECG showed a new LBBB but this has resolved.  -- She has had some HR drops  into 40s on tele and also some afib. -- Plan to stop Toprol  XL 12.5 mg daily and place a Zio Patch to rule out HAVB post discharge.   Paroxysmal atrial fibrillation: -- ECG showed afib, but currently in sinus on tele. Has been in and out of afib. -- Continue amiodarone  100 mg daily and Eliquis  5 mg twice daily.  -- As above, stop Toprol  XL 12.5 mg daily given HR drops into 40s.   HTN: -- BP well controlled. -- Resume home Lasix  20mg  BID.  Chronic HFpEF: -- Appears euvolemic.  -- Resume Lasix  20 mg BID, may be able to de-escalate diuretics in the outpatient setting.    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 -- Continue medical therapy.   Acute blood loss anemia: -- Hg has remained stable and 10.1 on the day of discharge.   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  -- A follow-up multiphase MRI or CT of the abdomen is suggested in one year. -- This will be dicussed in the outpatient setting.   _____________  Discharge Vitals Blood pressure (!) 130/45, pulse 70, temperature 98.4 F (36.9 C), temperature source Oral, resp. rate 19, height 5' 2 (1.575 m), weight 75.6 kg, SpO2 98%.  Filed Weights   01/01/24 0728 01/02/24 0300  Weight: 73.3 kg 75.6 kg     GEN: Well nourished, well developed in no acute distress NECK: No JVD CARDIAC: RR, frequent ectopy, no murmurs, rubs, gallops RESPIRATORY:  Clear to auscultation without rales, wheezing or rhonchi  ABDOMEN: Soft, non-tender, non-distended EXTREMITIES:  No edema; No deformity.  Groin sites clear without hematoma or ecchymosis.    Disposition   Pt is being discharged home today in good condition.  Follow-up Plans & Appointments     Follow-up Information     Ladona Heinz, MD. Go on 01/17/2024.   Specialty: Cardiology Why: @ 9am. Please arrive 15 minutes early. Contact information: [address] [place] [address] [phone]                  Discharge Medications   Allergies as of 01/02/2024       Reactions   Codeine Itching   After 3 days   Dilaudid [hydromorphone Hcl] Itching   After 3 days        Medication List     STOP taking these medications    metoprolol  succinate 25 MG  24 hr tablet Commonly known as: TOPROL -XL       TAKE these medications    Abreva 10 % Crea Generic drug: Docosanol Apply 1 application  topically as needed (fever blister).   amiodarone  200 MG tablet Commonly known as: PACERONE  Take 0.5 tablets (100 mg total) by mouth daily.   amoxicillin 500 MG capsule Commonly known as: AMOXIL Take 4 capsules (2,000 mg total) by mouth 1 hour prior to dental work including cleanings as directed.   apixaban  5 MG Tabs tablet Commonly known as: Eliquis  Take 1 tablet (5 mg total) by mouth 2 (two) times daily.   atorvastatin  40 MG tablet Commonly known as: LIPITOR Take 1 tablet (40 mg total) by mouth daily at 6PM.   Cholecalciferol 25 MCG (1000 UT) capsule Take 1,000 Units by mouth daily.   cyanocobalamin  1000 MCG tablet Commonly known as: VITAMIN B12 Take 1 tablet (1,000 mcg total) by mouth 3 (three) times a week. Take on Mon-Wed-Fr.   FeroSul 325 (65 Fe) MG tablet Generic drug: ferrous sulfate  Take 1 tablet (325 mg total) by mouth 2 (two) times daily with a meal.   furosemide  20 MG tablet Commonly known as: LASIX  Take 1 tablet (20 mg total) by mouth 2 (two) times daily.   levothyroxine  88 MCG tablet Commonly known as: SYNTHROID  Take 1 tablet (88 mcg total) by mouth daily.   OneTouch Delica Plus Lancet33G Misc use one in the morning, at noon, and at bedtime to check blood glucose levels   OneTouch Verio test strip Generic drug: glucose blood use to test blood glucose levels in the morning, at noon, and at bedtime.   pantoprazole  40 MG tablet Commonly known as: Protonix  Take 1 tablet (40 mg total)  by mouth daily.   PRESERVISION AREDS 2 PO Take 1 tablet by mouth 2 (two) times daily.   pyridOXINE 50 MG tablet Commonly known as: B-6 Take 50 mg by mouth daily.   rOPINIRole  0.25 MG tablet Commonly known as: REQUIP  Take 1 tablet (0.25 mg total) by mouth at bedtime.   valACYclovir  500 MG tablet Commonly known as: VALTREX  Take 1 tablet (500 mg total) by mouth daily as needed (fever blister).   VITAMIN C PO Take 500 mg by mouth in the morning.         Outstanding Labs/Studies   none ______________________  Duration of Discharge Encounter: APP Time: 20 minutes. MD Time: 25 minutes.     Bonney Lamarr Hummer, PA-C 01/02/2024, 11:09 AM [phone]

## 2024-01-02 ENCOUNTER — Inpatient Hospital Stay (HOSPITAL_COMMUNITY)

## 2024-01-02 ENCOUNTER — Inpatient Hospital Stay (HOSPITAL_COMMUNITY)
Admit: 2024-01-02 | Discharge: 2024-01-02 | Disposition: A | Discharge status: Home / Self Care | Attending: Physician Assistant

## 2024-01-02 ENCOUNTER — Other Ambulatory Visit: Payer: Self-pay

## 2024-01-02 ENCOUNTER — Ambulatory Visit: Payer: Self-pay | Admitting: Family Medicine

## 2024-01-02 ENCOUNTER — Other Ambulatory Visit: Payer: Self-pay | Admitting: Physician Assistant

## 2024-01-02 ENCOUNTER — Other Ambulatory Visit (HOSPITAL_COMMUNITY): Payer: Self-pay

## 2024-01-02 DIAGNOSIS — I4891 Unspecified atrial fibrillation: Secondary | ICD-10-CM

## 2024-01-02 DIAGNOSIS — Z952 Presence of prosthetic heart valve: Secondary | ICD-10-CM

## 2024-01-02 LAB — BASIC METABOLIC PANEL WITH GFR
Anion gap: 11 (ref 5–15)
BUN: 13 mg/dL (ref 8–23)
CO2: 24 mmol/L (ref 22–32)
Calcium: 8.5 mg/dL — ABNORMAL LOW (ref 8.9–10.3)
Chloride: 103 mmol/L (ref 98–111)
Creatinine, Ser: 0.99 mg/dL (ref 0.44–1.00)
GFR, Estimated: 56 mL/min — ABNORMAL LOW (ref >60)
Glucose, Bld: 128 mg/dL — ABNORMAL HIGH (ref 70–99)
Potassium: 3.9 mmol/L (ref 3.5–5.1)
Sodium: 138 mmol/L (ref 135–145)

## 2024-01-02 LAB — CBC
HCT: 31.6 % — ABNORMAL LOW (ref 36.0–46.0)
Hemoglobin: 10.1 g/dL — ABNORMAL LOW (ref 12.0–15.0)
MCH: 27.9 pg (ref 26.0–34.0)
MCHC: 32 g/dL (ref 30.0–36.0)
MCV: 87.3 fL (ref 80.0–100.0)
Platelets: 347 10*3/uL (ref 150–400)
RBC: 3.62 MIL/uL — ABNORMAL LOW (ref 3.87–5.11)
RDW: 15.8 % — ABNORMAL HIGH (ref 11.5–15.5)
WBC: 11.3 10*3/uL — ABNORMAL HIGH (ref 4.0–10.5)
nRBC: 0 % (ref 0.0–0.2)

## 2024-01-02 LAB — ECHOCARDIOGRAM COMPLETE
AV Mean grad: 8 mmHg
AV Peak grad: 16.5 mmHg
Ao pk vel: 2.03 m/s
Calc EF: 70.2 %
Height: 62 in
MV M vel: 5.68 m/s
MV Peak grad: 129 mmHg
S' Lateral: 3 cm
Single Plane A2C EF: 73.8 %
Single Plane A4C EF: 67.6 %
Weight: 2667.2 [oz_av]

## 2024-01-02 LAB — MAGNESIUM: Magnesium: 1.7 mg/dL (ref 1.7–2.4)

## 2024-01-02 MED ORDER — AMOXICILLIN 500 MG PO CAPS
2000.0000 mg | ORAL_CAPSULE | ORAL | 6 refills | Status: AC
  Filled 2024-01-02: qty 12, 30d supply, fill #0

## 2024-01-02 NOTE — Progress Notes (Signed)
*  PRELIMINARY RESULTS* Echocardiogram 2D Echocardiogram has been performed.  Ndrew Creason D Roanna Reaves 01/02/2024, 11:52 AM

## 2024-01-02 NOTE — Progress Notes (Signed)
 ZIO AT to be applied at hospital.  Dr. Ladona to read.

## 2024-01-02 NOTE — Progress Notes (Signed)
   01/02/24 1342  TOC Brief Assessment  Insurance and Status Reviewed  Patient has primary care physician Yes  Home environment has been reviewed home w/ spouse  Prior level of function: independent  Prior/Current Home Services No current home services  Social Drivers of Health Review SDOH reviewed no interventions necessary  Readmission risk has been reviewed Yes  Transition of care needs no transition of care needs at this time    Pt stable for transition home today s/p TAVR, no HH or DME needs noted.

## 2024-01-02 NOTE — Progress Notes (Signed)
 CARDIAC REHAB PHASE I    Post TAVR education including site care, restrictions, risk factors, heart healthy diabetic diet, exercise guidelines and CRP2 reviewed. All questions and concerns addressed. Pt not interested in CRP2 at this time. Plan for discharge home later today.   [id number] Vaughn Asberry Hacking, RN BSN 01/02/2024 11:05 AM

## 2024-01-03 ENCOUNTER — Telehealth: Payer: Self-pay | Admitting: Physician Assistant

## 2024-01-03 DIAGNOSIS — I4891 Unspecified atrial fibrillation: Secondary | ICD-10-CM | POA: Diagnosis not present

## 2024-01-03 DIAGNOSIS — Z952 Presence of prosthetic heart valve: Secondary | ICD-10-CM | POA: Diagnosis not present

## 2024-01-03 NOTE — Telephone Encounter (Signed)
  HEART AND VASCULAR CENTER   MULTIDISCIPLINARY HEART VALVE TEAM   Patient contacted regarding discharge from [facility] on 01/02/24  Patient understands to follow up with Dr. Ladona 7/10. Patient understands discharge instructions? yes Patient understands medications and regimen? yes Patient understands to bring all medications to this visit? yes  Lamarr Hummer PA-C  [facility]

## 2024-01-08 ENCOUNTER — Other Ambulatory Visit: Payer: Self-pay

## 2024-01-12 ENCOUNTER — Other Ambulatory Visit (HOSPITAL_COMMUNITY): Payer: Self-pay

## 2024-01-17 ENCOUNTER — Other Ambulatory Visit (HOSPITAL_COMMUNITY): Payer: Self-pay

## 2024-01-17 ENCOUNTER — Encounter: Payer: Self-pay | Admitting: Cardiology

## 2024-01-17 ENCOUNTER — Other Ambulatory Visit: Payer: Self-pay | Admitting: *Deleted

## 2024-01-17 ENCOUNTER — Ambulatory Visit: Attending: Cardiology | Admitting: Cardiology

## 2024-01-17 VITALS — BP 157/64 | HR 61 | Resp 16 | Ht 62.0 in | Wt 165.0 lb

## 2024-01-17 DIAGNOSIS — I350 Nonrheumatic aortic (valve) stenosis: Secondary | ICD-10-CM | POA: Diagnosis not present

## 2024-01-17 DIAGNOSIS — I10 Essential (primary) hypertension: Secondary | ICD-10-CM

## 2024-01-17 DIAGNOSIS — I340 Nonrheumatic mitral (valve) insufficiency: Secondary | ICD-10-CM

## 2024-01-17 DIAGNOSIS — Z952 Presence of prosthetic heart valve: Secondary | ICD-10-CM

## 2024-01-17 DIAGNOSIS — I480 Paroxysmal atrial fibrillation: Secondary | ICD-10-CM | POA: Diagnosis not present

## 2024-01-17 MED ORDER — LOSARTAN POTASSIUM-HCTZ 50-12.5 MG PO TABS
1.0000 | ORAL_TABLET | ORAL | 2 refills | Status: DC
  Filled 2024-01-17: qty 30, 30d supply, fill #0
  Filled 2024-02-07 – 2024-02-09 (×2): qty 30, 30d supply, fill #1
  Filled 2024-03-12: qty 30, 30d supply, fill #0
  Filled 2024-03-12: qty 30, 30d supply, fill #2

## 2024-01-17 NOTE — Progress Notes (Signed)
 Cardiology Office Note:  .   Date:  01/17/2024  ID:  JESTINE BICKNELL, DOB 1941-01-09, MRN [id number] PCP: [name], MD   HeartCare Providers Cardiologist:  Gordy Bergamo, MD Structural Heart:  Ozell Fell, MD  History of Present Illness: .   Dana Sandoval is a 83 y.o. Caucasian female with history of hyperlipidemia, controlled diabetes, HTN, hypothyroidism,  abdominal atherosclerosis and abdominal ectasia, TIA with visual disturbances in Mar 2023 with AF noted on Zio and presently on anticoagulation underwent successful TAVR on 01/01/2024 and presents for 49-month follow-up.  Post procedure patient developed new onset left bundle branch block that resolved and also had paroxysmal episodes of atrial fibrillation.  She is presently wearing an event monitor to exclude high degree AV block post discharge.  Discussed the use of AI scribe software for clinical note transcription with the patient, who gave verbal consent to proceed.  History of Present Illness Dana Sandoval is an 83 year old female with atrial fibrillation and aortic valve replacement who presents for cardiovascular follow-up. She feels similar to her condition before recent medical events and does not feel unwell. Her breathing remains stable, allowing her to walk upstairs without significant difficulty. Since her discharge on June 5th, after she was evaluated for GI bleed and received 2 units of packed RBCs, she has been doing well.  She was hospitalized on June 2nd for low hemoglobin and bleeding, during which gastric ulcers were identified. Her hemoglobin has improved to 10.8 g/dL with iron supplements, and PPI which she continues to take.  She underwent a TAVR procedure on June 24th for aortic valve replacement. Her current medications include Eliquis  for atrial fibrillation and amiodarone  (100 mg daily) for rhythm control. She also takes Lasix  (20 mg twice daily).  She regularly monitors her heart rate and  oxygen levels, noting her heart rate is usually between 60 to 70 bpm, occasionally dropping below 60. Her blood pressure was 142/70 recently. No significant changes in her breathing and no disturbance in her sleep related to her current medications.   Labs   No results found for: LIPOA  Lab Results  Component Value Date   CHOL 120 10/09/2023   HDL 33.70 (L) 10/09/2023   LDLCALC 67 10/09/2023   TRIG 94.0 10/09/2023   CHOLHDL 4 10/09/2023   No results found for: LIPOA  Lab Results  Component Value Date   NA 138 01/02/2024   K 3.9 01/02/2024   CO2 24 01/02/2024   GLUCOSE 128 (H) 01/02/2024   BUN 13 01/02/2024   CREATININE 0.99 01/02/2024   CALCIUM  8.5 (L) 01/02/2024   GFR 65.68 10/09/2023   EGFR 46 (L) 12/20/2023   GFRNONAA 56 (L) 01/02/2024      Latest Ref Rng & Units 01/02/2024    3:17 AM 01/01/2024   11:02 AM 12/28/2023    9:29 AM  BMP  Glucose 70 - 99 mg/dL 871  803  861   BUN 8 - 23 mg/dL 13  11  12    Creatinine 0.44 - 1.00 mg/dL 9.00  8.89  8.85   Sodium 135 - 145 mmol/L 138  141  138   Potassium 3.5 - 5.1 mmol/L 3.9  3.4  3.5   Chloride 98 - 111 mmol/L 103  100  102   CO2 22 - 32 mmol/L 24   27   Calcium  8.9 - 10.3 mg/dL 8.5   8.2       Latest Ref Rng & Units 01/02/2024  3:17 AM 01/01/2024   11:02 AM 12/28/2023    9:29 AM  CBC  WBC 4.0 - 10.5 K/uL 11.3   7.4   Hemoglobin 12.0 - 15.0 g/dL 89.8  8.8  89.0   Hematocrit 36.0 - 46.0 % 31.6  26.0  34.9   Platelets 150 - 400 K/uL 347   409    Lab Results  Component Value Date   HGBA1C 6.4 10/09/2023    Lab Results  Component Value Date   TSH 1.15 10/09/2023     ROS  Review of Systems  Cardiovascular:  Negative for chest pain, dyspnea on exertion and leg swelling.    Physical Exam:   VS:  BP (!) 157/64 (BP Location: Left Arm, Patient Position: Sitting, Cuff Size: Normal)   Pulse 61   Resp 16   Ht 5' 2 (1.575 m)   Wt 165 lb (74.8 kg)   SpO2 98%   BMI 30.18 kg/m    Wt Readings from Last 3  Encounters:  01/17/24 165 lb (74.8 kg)  01/02/24 166 lb 11.2 oz (75.6 kg)  12/20/23 161 lb 9.6 oz (73.3 kg)    Physical Exam Neck:     Vascular: No JVD.  Cardiovascular:     Rate and Rhythm: Normal rate and regular rhythm.     Pulses: Intact distal pulses.     Heart sounds: S1 normal and S2 normal. Murmur heard.     Blowing holosystolic murmur is present with a grade of 3/6 at the apex.     No gallop.  Pulmonary:     Effort: Pulmonary effort is normal.     Breath sounds: Normal breath sounds.  Abdominal:     General: Bowel sounds are normal.     Palpations: Abdomen is soft.  Musculoskeletal:     Right lower leg: No edema.     Left lower leg: No edema.    Studies Reviewed: .    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  Normal coronary arteries.   ECHOCARDIOGRAM COMPLETE 01/02/2024  1. Left ventricular ejection fraction, by estimation, is 60 to 65%. The left ventricle has normal function. The left ventricle has no regional wall motion abnormalities. There is moderate concentric left ventricular hypertrophy. Left ventricular diastolic function could not be evaluated. 2. Right ventricular systolic function is normal. The right ventricular size is normal. 3. Left atrial size was moderately dilated. 4. The mitral valve is degenerative. Moderate mitral valve regurgitation. No evidence of mitral stenosis. The mean mitral valve gradient is 3.0 mmHg. Moderate to severe mitral annular calcification. 5. Tricuspid valve regurgitation is mild to moderate. 6. The aortic valve has been repaired/replaced. Aortic valve regurgitation is not visualized. No aortic stenosis is present. There is a 23 mm Sapien prosthetic (TAVR) valve present in the aortic position. Aortic valve mean gradient measures 8.0 mmHg. Aortic valve Vmax measures 2.03 m/s.  EKG:    EKG Interpretation Date/Time:  Thursday January 17 2024 08:59:15 EDT Ventricular Rate:  63 PR  Interval:  220 QRS Duration:  90 QT Interval:  460 QTC Calculation: 470 R Axis:   34  Text Interpretation: EKG 01/17/2024: Sinus rhythm with first-degree AV block at rate of 63 bpm, otherwise normal EKG.  Compared to 01/02/2024, first-degree block is new. Confirmed by Evaline Waltman, Jagadeesh ([id number]) on 01/17/2024 9:34:05 AM    Medications ordered    Meds ordered this encounter  Medications   losartan -hydrochlorothiazide  (HYZAAR ) 50-12.5 MG tablet    Sig:  Take 1 tablet by mouth every morning.    Dispense:  30 tablet    Refill:  2     ASSESSMENT AND PLAN: .      ICD-10-CM   1. Nonrheumatic aortic valve stenosis  I35.0 EKG 12-Lead    CBC    Basic Metabolic Panel (BMET)    CANCELED: CBC    CANCELED: Basic Metabolic Panel (BMET)    2. S/P TAVR (transcatheter aortic valve replacement) 01/01/2024: 23 mm Edwards S3UR via the TF approach  Z95.2 CBC    Basic Metabolic Panel (BMET)    CANCELED: CBC    CANCELED: Basic Metabolic Panel (BMET)    3. Moderate mitral regurgitation  I34.0 CBC    Basic Metabolic Panel (BMET)    CANCELED: CBC    CANCELED: Basic Metabolic Panel (BMET)    4. Paroxysmal atrial fibrillation (HCC)  I48.0 CBC    CANCELED: CBC    CANCELED: Basic Metabolic Panel (BMET)    5. Primary hypertension  I10 losartan -hydrochlorothiazide  (HYZAAR ) 50-12.5 MG tablet    CBC    Basic Metabolic Panel (BMET)    CANCELED: Basic Metabolic Panel (BMET)     Assessment & Plan Mitral valve regurgitation Moderate mitral valve regurgitation with a pansystolic murmur. No significant symptoms warranting surgical intervention.  Paroxysmal atrial fibrillation Currently in regular rhythm with no atrial fibrillation detected. - Continue amiodarone  100 mg daily. - Continue Eliquis  as prescribed.  Hypertension Variable blood pressure readings with some elevated measurements. Transitioning from Lasix  to losartan  HCT for improved management. - Discontinue Lasix  20 mg twice daily. - Initiate  losartan  HCT, one pill once daily in the morning. - Order BMP and CBC in three weeks to monitor response to new medication.  Aortic valve stenosis, post-TAVR Status post-TAVR on January 01, 2024, with resolution of aortic stenosis and no complications.  Iron deficiency anemia Previously low hemoglobin due to bleeding, currently improving with hemoglobin at 10.8. - Continue current iron supplementation until the supply is finished.  Gastroesophageal reflux disease (GERD) GERD symptoms improved with Protonix , initially prescribed due to blood thinners and acid reflux. - Continue Protonix  for at least the next three months.  Office visit in 4 to 5 months.  She has an appointment to see Izetta Hummer, PA from structural heart disease standpoint, we will ask her to follow-up on CBC and also BMP.  Signed,  Gordy Bergamo, MD, [facility] 01/17/2024, 10:10 AM [facility] [address] [place], [place] [address] Phone: [phone]. Fax:  [phone]

## 2024-01-17 NOTE — Patient Instructions (Addendum)
 Medication Instructions:  Start losartan  hydrochlorothiazide  50/12.5 mg by mouth daily    *If you need a refill on your cardiac medications before your next appointment, please call your pharmacy*  Lab Work: Have lab work (CBC and BMP) done in 3 weeks.  This is not fasting.  Can be done at any [facility] location.  There is a [employer] on the first floor of our building If you have labs (blood work) drawn today and your tests are completely normal, you will receive your results only by: MyChart Message (if you have MyChart) OR A paper copy in the mail If you have any lab test that is abnormal or we need to change your treatment, we will call you to review the results.  Testing/Procedures: none  Follow-Up: At [facility], you and your health needs are our priority.  As part of our continuing mission to provide you with exceptional heart care, our providers are all part of one team.  This team includes your primary Cardiologist (physician) and Advanced Practice Providers or APPs (Physician Assistants and Nurse Practitioners) who all work together to provide you with the care you need, when you need it.  Your next appointment:   November 24 at 9 AM  Provider:   Gordy Bergamo, MD    We recommend signing up for the patient portal called MyChart.  Sign up information is provided on this After Visit Summary.  MyChart is used to connect with patients for Virtual Visits (Telemedicine).  Patients are able to view lab/test results, encounter notes, upcoming appointments, etc.  Non-urgent messages can be sent to your provider as well.   To learn more about what you can do with MyChart, go to [email or web address].   Other Instructions You are scheduled for an echocardiogram and appointment with K. Sebastian, [place] on August 7

## 2024-01-30 DIAGNOSIS — Z952 Presence of prosthetic heart valve: Secondary | ICD-10-CM

## 2024-01-30 DIAGNOSIS — I4891 Unspecified atrial fibrillation: Secondary | ICD-10-CM | POA: Diagnosis not present

## 2024-01-30 NOTE — Addendum Note (Signed)
 Encounter addended by: Malvina Pina A on: 01/30/2024 9:25 AM  Actions taken: Imaging Exam ended

## 2024-02-04 NOTE — Progress Notes (Signed)
 [facility] - Clinic Note  02/08/2024     CHIEF COMPLAINT Patient presents for Retina Follow Up  HISTORY OF PRESENT ILLNESS: Dana Sandoval is a 83 y.o. 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  This started 7 weeks ago.  I, the attending physician,  performed the HPI with the patient and updated documentation appropriately.        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 Patient states vision doing about the same. OS saw a couple things different. No eye pain. Had heart surgery aortic valve replaced since last here.      Last edited by Dezra Mandella, MD on 02/13/2024 10:49 PM.     Patient states she is seeing new floaters since the last injections  Referring physician: [name], MD [address], [address] [place],  [place] [address]  HISTORICAL INFORMATION:  Selected notes from the MEDICAL RECORD NUMBER Referred by Dr. CANDIE Gaudy for eval of ret heme OD   CURRENT MEDICATIONS: No current outpatient medications on file. (Ophthalmic Drugs)   No current facility-administered medications for this visit. (Ophthalmic Drugs)   Current Outpatient Medications (Other)  Medication Sig   amiodarone  (PACERONE ) 200 MG tablet Take 0.5 tablets (100 mg total) by mouth daily.   amoxicillin  (AMOXIL ) 500 MG capsule Take 4 capsules (2,000 mg total) by mouth 1 hour prior to dental work including cleanings as directed.   apixaban  (ELIQUIS ) 5 MG TABS tablet Take 1 tablet (5 mg total) by mouth 2 (two) times daily.   Ascorbic Acid (VITAMIN C PO) Take 500 mg by mouth in the morning.   atorvastatin  (LIPITOR) 40 MG tablet Take 1 tablet (40 mg total) by mouth daily at 6PM.   Cholecalciferol 25 MCG (1000 UT) capsule Take 1,000 Units by mouth daily.   cyanocobalamin  (VITAMIN B12) 1000 MCG tablet Take 1 tablet (1,000 mcg total) by mouth 3 (three) times a week. Take  on Mon-Wed-Fr.   Docosanol (ABREVA) 10 % CREA Apply 1 application  topically as needed (fever blister).   ferrous sulfate  325 (65 FE) MG tablet Take 1 tablet (325 mg total) by mouth 2 (two) times daily with a meal.   glucose blood (ONETOUCH VERIO) test strip use to test blood glucose levels in the morning, at noon, and at bedtime.   levothyroxine  (SYNTHROID ) 88 MCG tablet Take 1 tablet (88 mcg total) by mouth daily.   losartan -hydrochlorothiazide  (HYZAAR ) 50-12.5 MG tablet Take 1 tablet by mouth every morning.   Multiple Vitamins-Minerals (PRESERVISION AREDS 2 PO) Take 1 tablet by mouth 2 (two) times daily.    OneTouch Delica Lancets 33G MISC use one in the morning, at noon, and at bedtime to check blood glucose levels   pantoprazole  (PROTONIX ) 40 MG tablet Take 1 tablet (40 mg total) by mouth daily.   pyridOXINE (B-6) 50 MG tablet Take 50 mg by mouth daily.   rOPINIRole  (REQUIP ) 0.25 MG tablet Take 1 tablet (0.25 mg total) by mouth at bedtime.   valACYclovir  (VALTREX ) 500 MG tablet Take 1 tablet (500 mg total) by mouth daily as needed (fever blister).   No current facility-administered medications for this visit. (Other)   REVIEW OF SYSTEMS: ROS   Positive for: Endocrine, Cardiovascular, Eyes Negative for: Constitutional, Gastrointestinal, Neurological, Skin, Genitourinary, Musculoskeletal, HENT, Respiratory, Psychiatric,  Allergic/Imm, Heme/Lymph Last edited by Orval Asberry RAMAN, COA on 02/08/2024  8:18 AM.      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 Wonda and Dr. Shyrl   Severe aortic stenosis    Thyroid  disease    HYPOTHYROIDISM   Past Surgical History:  Procedure Laterality Date   ABDOMINAL HYSTERECTOMY  1977   TAH  (FIBROIDS)   CATARACT EXTRACTION Bilateral    COLONOSCOPY N/A 12/12/2023   Procedure: COLONOSCOPY;  Surgeon: Kristie Lamprey, MD;  Location: [facility] ENDOSCOPY;  Service: Gastroenterology;  Laterality: N/A;   DILATION AND CURETTAGE OF UTERUS     DOPPLER ECHOCARDIOGRAPHY  2009   HAD AN ECHOCARDIOGRAM THAT SHOWED MODERATE MITRAL  ANNULAR CALCIFICATION AND A NORMAL EJECTION FRACTION OF 55%   ESOPHAGOGASTRODUODENOSCOPY N/A 12/12/2023   Procedure: EGD (ESOPHAGOGASTRODUODENOSCOPY);  Surgeon: Kristie Lamprey, MD;  Location: [facility] ENDOSCOPY;  Service: Gastroenterology;  Laterality: N/A;   GIVENS CAPSULE STUDY N/A 12/12/2023   Procedure: IMAGING PROCEDURE, GI TRACT, INTRALUMINAL, VIA CAPSULE;  Surgeon: Kristie Lamprey, MD;  Location: [facility] ENDOSCOPY;  Service: Gastroenterology;  Laterality: N/A;   INTRAOPERATIVE TRANSTHORACIC ECHOCARDIOGRAM N/A 01/01/2024   Procedure: ECHOCARDIOGRAM, TRANSTHORACIC;  Surgeon: Wonda Sharper, MD;  Location: [facility] INVASIVE CV LAB;  Service: Cardiovascular;  Laterality: N/A;   RIGHT HEART CATH AND CORONARY ANGIOGRAPHY N/A 10/24/2023   Procedure: RIGHT HEART CATH AND CORONARY ANGIOGRAPHY;  Surgeon: Ladona Heinz, MD;  Location: [facility] INVASIVE CV LAB;  Service: Cardiovascular;  Laterality: N/A;   SHOULDER SURGERY     RIGHT SHOULDER SURGERY   THYROIDECTOMY     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   RIGHT   TOTAL KNEE ARTHROPLASTY Left 09/10/2018   Procedure: TOTAL KNEE ARTHROPLASTY;  Surgeon: Sheril Coy, MD;  Location: [facility] ORS;  Service: Orthopedics;  Laterality: Left;   FAMILY HISTORY Family History  Problem Relation Age of Onset   Osteoporosis Mother    Heart disease Mother    Macular degeneration Mother    Heart disease Father    Cancer Father        BRAIN TUMOR   Cancer Brother        BRAIN  TUMOR   Diabetes Maternal Aunt    Diabetes Maternal Uncle    Stroke Paternal Grandfather    Colon cancer Neg Hx    Breast cancer Neg Hx    Migraines Neg Hx    SOCIAL HISTORY Social History   Tobacco Use   Smoking status: Never   Smokeless tobacco: Never  Vaping Use   Vaping status: Never Used  Substance Use Topics   Alcohol use: No    Alcohol/week: 0.0 standard drinks of alcohol   Drug use: No       OPHTHALMIC EXAM: Base Eye Exam     Visual Acuity (Snellen - Linear)       Right Left   Dist [place] 20/40 -2 20/40 +1         Tonometry (  Tonopen, 8:16 AM)       Right Left   Pressure 20 20         Pupils       Dark Light Shape React APD   Right 3 2 Round Brisk None   Left 3 2 Round Brisk None         Visual Fields (Counting fingers)       Left Right    Full Full         Extraocular Movement       Right Left    Full, Ortho Full, Ortho         Neuro/Psych     Oriented x3: Yes   Mood/Affect: Normal         Dilation     Both eyes: 1.0% Mydriacyl, 2.5% Phenylephrine  @ 8:15 AM           Slit Lamp and Fundus Exam     Slit Lamp Exam       Right Left   Lids/Lashes Dermatochalasis - upper lid Dermatochalasis - upper lid   Conjunctiva/Sclera White and quiet White and quiet   Cornea 1+ Punctate epithelial erosions, fine endo pigment, trace tear film debris 1+ fine Punctate epithelial erosions, fine endo pigment, trace tear film debris   Anterior Chamber Deep and quiet Deep and quiet   Iris Round and dilated Round and dilated   Lens Toric PC IOL with marks at 1200 and 0600, 1+ Posterior capsular opacification nasal side approaching visual axis Toric PC IOL with marks at 1200 and 0600, 1+ Posterior capsular opacification   Anterior Vitreous Vitreous syneresis, trace pigment, Posterior vitreous detachment, vitreous condensations Vitreous syneresis, Posterior vitreous detachment, vitreous condensations         Fundus Exam       Right Left    Disc mild Pallor, Sharp rim, +cupping, PPA mild Pallor, Sharp rim, +cupping, PPA   C/D Ratio 0.7 0.7   Macula Blunted foveal reflex, central CNV with subretinal heme -- stably resolved -- now with pigment clumping, RPE mottling, clumping and atrophy, Drusen, stable improvement in shallow SRF overlying CNV, trace cystic changes temporal mac -- stably improved Flat, Blunted foveal reflex, Drusen, RPE mottling, clumping and atrophy, focal IRH/SRH inferior to fovea -- stably improved, scattered patches of GA, no heme or fluid   Vessels attenuated, Tortuous attenuated, mild tortuosity   Periphery Attached, reticular degeneration, paving stone degeneration IT quad, No heme Attached, reticular degeneration, mild inferior paving stone degeneration, No heme           IMAGING AND PROCEDURES  Imaging and Procedures for 02/08/2024  OCT, Retina - OU - Both Eyes       Right Eye Quality was good. Central Foveal Thickness: 348. Progression has been stable. Findings include no SRF, abnormal foveal contour, retinal drusen , subretinal hyper-reflective material, intraretinal fluid, pigment epithelial detachment, outer retinal atrophy (persistent PED, persistent IRF / cystic changes, stable improvement in SRF overlying PED, +ORA).   Left Eye Quality was good. Central Foveal Thickness: 231. Progression has been stable. Findings include normal foveal contour, no IRF, no SRF, retinal drusen , outer retinal atrophy (Stable improvement in focal central SRHM ).   Notes *Images captured and stored on drive  Diagnosis / Impression:  Ex ARMD OU OD: persistent PED, persistent IRF / cystic changes, stable improvement in SRF overlying PED, +ORA OS: Stable improvement in focal central [facility]   Clinical management:  See below  Abbreviations: NFP - Normal foveal  profile. CME - cystoid macular edema. PED - pigment epithelial detachment. IRF - intraretinal fluid. SRF - subretinal fluid. EZ - ellipsoid zone. ERM -  epiretinal membrane. ORA - outer retinal atrophy. ORT - outer retinal tubulation. SRHM - subretinal hyper-reflective material. IRHM - intraretinal hyper-reflective material      Intravitreal Injection, Pharmacologic Agent - OD - Right Eye       Time Out 02/08/2024. 9:15 AM. Confirmed correct patient, procedure, site, and patient consented.   Anesthesia Topical anesthesia was used. Anesthetic medications included Lidocaine  2%, Proparacaine 0.5%.   Procedure Preparation included 5% betadine to ocular surface, eyelid speculum. A supplied (32g) needle was used.   Injection: 6 mg faricimab -svoa 6 MG/0.05ML (Patient supplied)   Route: Intravitreal, Site: Right Eye   NDC: 49757-903-98, Lot: [id number], Expiration date: 04/08/2026, Waste: 0 mL   Post-op Post injection exam found visual acuity of at least counting fingers. The patient tolerated the procedure well. There were no complications. The patient received written and verbal post procedure care education. Post injection medications were not given.   Notes **SAMPLE MEDICATION ADMINISTERED**      Intravitreal Injection, Pharmacologic Agent - OS - Left Eye       Time Out 02/08/2024. 9:15 AM. Confirmed correct patient, procedure, site, and patient consented.   Anesthesia Topical anesthesia was used. Anesthetic medications included Lidocaine  2%, Proparacaine 0.5%.   Procedure Preparation included 5% betadine to ocular surface, eyelid speculum. A supplied (33g) needle was used.   Injection: 1.25 mg Bevacizumab  1.25mg /0.55ml   Route: Intravitreal, Site: Left Eye   NDC: C2662926, Lot: [id number]$$[id number]_khpmPdaepuKcQbjBDRhJwGtzFGlGMqlx$ , Expiration date: 03/02/2024   Post-op Post injection exam found visual acuity of at least counting fingers. The patient tolerated the procedure well. There were no complications. The patient received written and verbal post procedure care education.            ASSESSMENT/PLAN:   ICD-10-CM   1. Exudative age-related macular  degeneration of right eye with active choroidal neovascularization (HCC)  H35.3211 OCT, Retina - OU - Both Eyes    Intravitreal Injection, Pharmacologic Agent - OD - Right Eye    faricimab -svoa (VABYSMO ) 6mg /0.50mL intravitreal injection    2. Exudative age-related macular degeneration of left eye with active choroidal neovascularization (HCC)  H35.3221 OCT, Retina - OU - Both Eyes    Intravitreal Injection, Pharmacologic Agent - OS - Left Eye    Bevacizumab  (AVASTIN ) SOLN 1.25 mg    3. Essential hypertension  I10     4. Hypertensive retinopathy of both eyes  H35.033     5. Pseudophakia, both eyes  Z96.1     6. PCO (posterior capsule opacification), bilateral  H26.493      1. Exudative age related macular degeneration, OD - s/p IVA OD #1 (05.06.22), #2 (06.03.22), #3 (07.01.22), #4 (07.29.22), #5 (08.26.22), #6 (09.23.22), #7 (10.21.22), #8 (11.18.22), #9 (12.16.22), #10 (01.20.23) -- IVA resistance ===================================== - s/p IVE OD #1 (02.24.23) -- sample, #2 (03.24.23), #3 (04.21.23), #4 (05.19.23), #5 (06.16.23), #6 (07.21.23), #7 (09.01.23), #8 (10.27.23), #9 (12.15.23), #10 (02.02.24), #11 (03.29.24), #12 (06.06.24) #13 (07.26.24), #14 (09.13.24), #15 (10.31.24), #16 (12.20.24) -- IVE resistance ====================================== - s/p IVV OD #1 (02.07.25, sample), #2 (03.21.25, sample), #3 (05.02.25, sample), #4 (06.16.25, sample) - OCT shows  OD: persistent PED, persistent IRF / cystic changes, stable improvement in SRF overlying PED, +ORA at 6+ weeks  - BCVA 20/40 OD -- stable - recommend IVV OD #5 (SAMPLE) today, 08.01.25 w/ f/u extended to 7 wks  -  RBA of procedure discussed, questions answered - see procedure note - IVV informed consent obtained and signed, 02.07.25 (OD)  - f/u 7 wks -- DFE/OCT, possible injection  2. Exudative age related macular degeneration, left eye   - early conversion to exudative ARMD -- noted on 01.04.24 - s/p IVA OS #1  (01.04.24), #2 (02.02.24), #3 (03.01.24), #4 (03.29.24), #5 (05.03.24), #6 (06.06.24), #7 (07.26.24), #8 (09.13.24), #9 (10.31.24), #10 (12.20.24), #11 (02.07.25), #12 (06.16.25)  - BCVA OS 20/40 -- stable - OCT shows ND:Dujaoz improvement in focal central SRHM at 6+ weeks - recommend IVA OS #13 today, 08.01.25 - pt wishes to proceed with injection - RBA of procedure discussed, questions answered - informed consent obtained and signed - see procedure note  - f/u in 7 wks, DFE, OCT, possible injxns  3,4. Hypertensive retinopathy OU - discussed importance of tight BP control - monitor  5. Pseudophakia OU  - s/p CE/IOL  - IOL in good position, doing well  - monitor   6. PCO OU (OD > OS)  - continue to monitor   Ophthalmic Meds Ordered this visit:  Meds ordered this encounter  Medications   Bevacizumab  (AVASTIN ) SOLN 1.25 mg   faricimab -svoa (VABYSMO ) 6mg /0.63mL intravitreal injection     Return in about 7 weeks (around 03/28/2024) for f/u exu ARMD OU, DFE, OCT, Possible Injxn.  There are no Patient Instructions on file for this visit.   This document serves as a record of services personally performed by Redell JUDITHANN Hans, MD, PhD. It was created on their behalf by Avelina Pereyra, COA an ophthalmic technician. The creation of this record is the provider's dictation and/or activities during the visit.   Electronically signed by: Avelina GORMAN Pereyra, COT  02/13/24  10:55 PM   This document serves as a record of services personally performed by Redell JUDITHANN Hans, MD, PhD. It was created on their behalf by Alan PARAS. Delores, OA an ophthalmic technician. The creation of this record is the provider's dictation and/or activities during the visit.    Electronically signed by: Alan PARAS. Delores, OA 02/13/24 10:55 PM   Redell JUDITHANN Hans, M.D., Ph.D. Diseases & Surgery of the Retina and Vitreous [facility]  I have reviewed the above documentation for accuracy and  completeness, and I agree with the above. Redell JUDITHANN Hans, M.D., Ph.D. 02/13/24 10:56 PM   Abbreviations: M myopia (nearsighted); A astigmatism; H hyperopia (farsighted); P presbyopia; Mrx spectacle prescription;  CTL contact lenses; OD right eye; OS left eye; OU both eyes  XT exotropia; ET esotropia; PEK punctate epithelial keratitis; PEE punctate epithelial erosions; DES dry eye syndrome; MGD meibomian gland dysfunction; ATs artificial tears; PFAT's preservative free artificial tears; NSC nuclear sclerotic cataract; PSC posterior subcapsular cataract; ERM epi-retinal membrane; PVD posterior vitreous detachment; RD retinal detachment; DM diabetes mellitus; DR diabetic retinopathy; NPDR non-proliferative diabetic retinopathy; PDR proliferative diabetic retinopathy; CSME clinically significant macular edema; DME diabetic macular edema; dbh dot blot hemorrhages; CWS cotton wool spot; POAG primary open angle glaucoma; C/D cup-to-disc ratio; HVF humphrey visual field; GVF goldmann visual field; OCT optical coherence tomography; IOP intraocular pressure; BRVO Branch retinal vein occlusion; CRVO central retinal vein occlusion; CRAO central retinal artery occlusion; BRAO branch retinal artery occlusion; RT retinal tear; SB scleral buckle; PPV pars plana vitrectomy; VH Vitreous hemorrhage; PRP panretinal laser photocoagulation; IVK intravitreal kenalog; VMT vitreomacular traction; MH Macular hole;  NVD neovascularization of the disc; NVE neovascularization elsewhere; AREDS age related eye disease study; ARMD  age related macular degeneration; POAG primary open angle glaucoma; EBMD epithelial/anterior basement membrane dystrophy; ACIOL anterior chamber intraocular lens; IOL intraocular lens; PCIOL posterior chamber intraocular lens; Phaco/IOL phacoemulsification with intraocular lens placement; PRK photorefractive keratectomy; LASIK laser assisted in situ keratomileusis; HTN hypertension; DM diabetes mellitus; COPD chronic  obstructive pulmonary disease

## 2024-02-08 ENCOUNTER — Other Ambulatory Visit (HOSPITAL_COMMUNITY): Payer: Self-pay

## 2024-02-08 ENCOUNTER — Ambulatory Visit (INDEPENDENT_AMBULATORY_CARE_PROVIDER_SITE_OTHER): Admitting: Ophthalmology

## 2024-02-08 ENCOUNTER — Encounter (INDEPENDENT_AMBULATORY_CARE_PROVIDER_SITE_OTHER): Payer: Self-pay | Admitting: Ophthalmology

## 2024-02-08 DIAGNOSIS — H353231 Exudative age-related macular degeneration, bilateral, with active choroidal neovascularization: Secondary | ICD-10-CM | POA: Diagnosis not present

## 2024-02-08 DIAGNOSIS — Z952 Presence of prosthetic heart valve: Secondary | ICD-10-CM | POA: Diagnosis not present

## 2024-02-08 DIAGNOSIS — Z961 Presence of intraocular lens: Secondary | ICD-10-CM

## 2024-02-08 DIAGNOSIS — H26493 Other secondary cataract, bilateral: Secondary | ICD-10-CM | POA: Diagnosis not present

## 2024-02-08 DIAGNOSIS — H35033 Hypertensive retinopathy, bilateral: Secondary | ICD-10-CM

## 2024-02-08 DIAGNOSIS — H353211 Exudative age-related macular degeneration, right eye, with active choroidal neovascularization: Secondary | ICD-10-CM

## 2024-02-08 DIAGNOSIS — I10 Essential (primary) hypertension: Secondary | ICD-10-CM

## 2024-02-08 DIAGNOSIS — I340 Nonrheumatic mitral (valve) insufficiency: Secondary | ICD-10-CM | POA: Diagnosis not present

## 2024-02-08 DIAGNOSIS — H353221 Exudative age-related macular degeneration, left eye, with active choroidal neovascularization: Secondary | ICD-10-CM

## 2024-02-08 DIAGNOSIS — I480 Paroxysmal atrial fibrillation: Secondary | ICD-10-CM | POA: Diagnosis not present

## 2024-02-08 DIAGNOSIS — I350 Nonrheumatic aortic (valve) stenosis: Secondary | ICD-10-CM | POA: Diagnosis not present

## 2024-02-08 LAB — CBC
Hematocrit: 32.4 % — ABNORMAL LOW (ref 34.0–46.6)
Hemoglobin: 10.4 g/dL — ABNORMAL LOW (ref 11.1–15.9)
MCH: 29.6 pg (ref 26.6–33.0)
MCHC: 32.1 g/dL (ref 31.5–35.7)
MCV: 92 fL (ref 79–97)
Platelets: 338 x10E3/uL (ref 150–450)
RBC: 3.51 x10E6/uL — ABNORMAL LOW (ref 3.77–5.28)
RDW: 15.6 % — ABNORMAL HIGH (ref 11.7–15.4)
WBC: 6 x10E3/uL (ref 3.4–10.8)

## 2024-02-09 ENCOUNTER — Encounter (INDEPENDENT_AMBULATORY_CARE_PROVIDER_SITE_OTHER): Payer: Self-pay | Admitting: Ophthalmology

## 2024-02-09 LAB — BASIC METABOLIC PANEL WITH GFR
BUN/Creatinine Ratio: 14 (ref 12–28)
BUN: 16 mg/dL (ref 8–27)
CO2: 23 mmol/L (ref 20–29)
Calcium: 8.5 mg/dL — ABNORMAL LOW (ref 8.7–10.3)
Chloride: 103 mmol/L (ref 96–106)
Creatinine, Ser: 1.11 mg/dL — ABNORMAL HIGH (ref 0.57–1.00)
Glucose: 148 mg/dL — ABNORMAL HIGH (ref 70–99)
Potassium: 4.2 mmol/L (ref 3.5–5.2)
Sodium: 141 mmol/L (ref 134–144)
eGFR: 49 mL/min/1.73 — ABNORMAL LOW (ref >59)

## 2024-02-09 MED ORDER — BEVACIZUMAB CHEMO INJECTION 1.25MG/0.05ML SYRINGE FOR KALEIDOSCOPE
1.2500 mg | INTRAVITREAL | Status: AC | PRN
  Administered 2024-02-09: 1.25 mg via INTRAVITREAL

## 2024-02-11 ENCOUNTER — Ambulatory Visit: Payer: Self-pay | Admitting: *Deleted

## 2024-02-11 ENCOUNTER — Other Ambulatory Visit: Payer: Self-pay

## 2024-02-11 ENCOUNTER — Other Ambulatory Visit (HOSPITAL_COMMUNITY): Payer: Self-pay

## 2024-02-12 NOTE — Progress Notes (Signed)
 Hb is coming up and encourage to increase greens and veggies. Renal function is very stable. Continue present meds

## 2024-02-13 MED ORDER — FARICIMAB-SVOA 6 MG/0.05ML IZ SOLN
6.0000 mg | INTRAVITREAL | Status: AC | PRN
  Administered 2024-02-13: 6 mg via INTRAVITREAL

## 2024-02-13 NOTE — Progress Notes (Unsigned)
 [facility]   MULTIDISCIPLINARY HEART VALVE CLINIC                                     Cardiology Office Note:    Date:  02/15/2024   ID:  [name], DOB [birth year], MRN [id number]  PCP:  [name], MD  [facility] Cardiologist:  Gordy Bergamo, MD  [facility] Structural heart: Ozell Fell, MD [facility] Electrophysiologist:  None   Referring MD: [name], MD   1 month s/p TAVR  History of Present Illness:    Dana Sandoval is a 83 y.o. 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   Echo 10/11/23 showed EF 60%, mild left ventricular hypertrophy of the basal-septal segment, elevated LVEDP, RSVP 51.4 mm hg, severe MAC with mild MS/mod MR, and severe AS with mean grad 48 mmHg AVA 0.40 cm2. She underwent cardiac catheterization on 10/24/23 which demonstrated no significant obstructive disease. Of note, her filling pressures were elevated with a mean RA of 14 mmHg mean PA of 44 mmHg, mean wedge pressure of 33 mmHg with V waves to 54 mmHg and a PVR consistent with pulmonary venous hypertension. She was started on Lasix  20mg  BID. She was then admitted 6/2-12/14/23 with acute anemia with Hg down to 4.8. FOBT +. She was transfused and underwent extensive GI work up including EGD, colonoscopy and capsule endoscopy which were unremarkable. She was restarted on her Eliqius and Hg remained stable. S/p successful TAVR with a 23 mm Edwards Sapien 3 Ultra Resilia THV via the TF approach on 01/01/24. Post operative echo showed EF 60%, mod concentric LVH, mod-severe MAC w/ mod MR, mild-mod TR, normally functioning TAVR with a mean gradient of 8 mmHg and no PVL. Given transient new LBBB and bradycardia, Torpol 12.5mg  daily was discontinued  and she was discharged with a Zio AT which did not show HAVB and 3% burden PAF. Average HR 67 bpm and 97 when in afib.  She was seen for post hospital follow up on 01/17/24 and lasix  was discontinued and started on Losartan -HCT. Follow up labs looked good.   Today the patient presents to clinic for follow up. Here with with daughter and husband. No CP. Still has some moderate SOB with exertion. No LE edema, orthopnea or PND. No dizziness or syncope. No blood in stool or urine. No palpitations. Had an eye injection and subsequent visual disturbance and occasional floaters.     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 Fell and Dr. Shyrl   Severe aortic stenosis    Thyroid  disease    HYPOTHYROIDISM     Current Medications: Current Meds  Medication Sig   amiodarone  (PACERONE ) 200 MG tablet Take 0.5 tablets (100 mg total) by mouth daily.  amoxicillin  (AMOXIL ) 500 MG capsule Take 4 capsules (2,000 mg total) by mouth 1 hour prior to dental work including cleanings as directed.   apixaban  (ELIQUIS ) 5 MG TABS tablet Take 1 tablet (5 mg total) by mouth 2 (two) times daily.   Ascorbic Acid (VITAMIN C PO) Take 500 mg by mouth in the morning.   atorvastatin  (LIPITOR) 40 MG tablet Take 1 tablet (40 mg total) by mouth daily at 6PM.   Cholecalciferol 25 MCG (1000 UT) capsule Take 1,000 Units by mouth daily.   cyanocobalamin  (VITAMIN B12) 1000 MCG tablet Take 1 tablet (1,000 mcg total) by mouth 3 (three) times a week. Take on Mon-Wed-Fr.   Docosanol (ABREVA) 10 % CREA Apply 1 application  topically as needed (fever blister).   ferrous sulfate  325 (65 FE) MG tablet Take 1 tablet (325 mg total) by  mouth 2 (two) times daily with a meal.   glucose blood (ONETOUCH VERIO) test strip use to test blood glucose levels in the morning, at noon, and at bedtime.   levothyroxine  (SYNTHROID ) 88 MCG tablet Take 1 tablet (88 mcg total) by mouth daily.   losartan -hydrochlorothiazide  (HYZAAR ) 50-12.5 MG tablet Take 1 tablet by mouth every morning.   Multiple Vitamins-Minerals (PRESERVISION AREDS 2 PO) Take 1 tablet by mouth 2 (two) times daily.    OneTouch Delica Lancets 33G MISC use one in the morning, at noon, and at bedtime to check blood glucose levels   pantoprazole  (PROTONIX ) 40 MG tablet Take 1 tablet (40 mg total) by mouth daily.   pyridOXINE (B-6) 50 MG tablet Take 50 mg by mouth daily.   rOPINIRole  (REQUIP ) 0.25 MG tablet Take 1 tablet (0.25 mg total) by mouth at bedtime.   valACYclovir  (VALTREX ) 500 MG tablet Take 1 tablet (500 mg total) by mouth daily as needed (fever blister).      ROS:   Please see the history of present illness.    All other systems reviewed and are negative.  EKGs       Risk Assessment/Calculations:    CHA2DS2-VASc Score =     This indicates a  % annual risk of stroke. The patient's score is based upon:            Physical Exam:    VS:  BP (!) 136/50   Pulse 68   Ht 5' 2 (1.575 m)   Wt 165 lb 6.4 oz (75 kg)   SpO2 96%   BMI 30.25 kg/m     Wt Readings from Last 3 Encounters:  02/14/24 165 lb 6.4 oz (75 kg)  01/17/24 165 lb (74.8 kg)  01/02/24 166 lb 11.2 oz (75.6 kg)     GEN: Well nourished, well developed in no acute distress NECK: No JVD CARDIAC: RRR, 3/6 holosystolic murmur loudest as LUSB. No, rubs, gallops RESPIRATORY:  Clear to auscultation without rales, wheezing or rhonchi  ABDOMEN: Soft, non-tender, non-distended EXTREMITIES:  No edema; No deformity.    ASSESSMENT:    1. 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 LBBB (left bundle branch block)   3. Paroxysmal  atrial fibrillation (HCC)   4. Primary hypertension   5. Chronic heart failure with preserved ejection fraction (HCC)   6. Mitral valve disease   7. Anemia, unspecified type   8. Lesion of pancreas     PLAN:    In order of problems listed above:  Severe AS s/p TAVR:  -- Echo today shows  EF 65%, mod LVH, mod pulm THN, normally functioning TAVR with a mean gradient of 9 mm hg and no PVL as well as severe MAC with severe MR and mild-mod TR. -- NYHA class II symptoms.  -- Continue Eliquis  5mg  BID.  -- SBE discussed; she has amoxicillin . -- I will see back for 1 year office visit with echo.   Paroxysmal atrial fibrillation: -- 3% burden on recent Zio AT. -- Continue amiodarone  100 mg daily and Eliquis  5 mg twice daily.    HTN: -- BP well controlled. -- Lasix  20mg  BID recently transitioned to Hyzaar  50-12.5mg  daily.    Chronic HFpEF: -- Appears euvolemic.  -- Continue Hyzaar  50-12.5mg  daily.    Mitral valve disease: -- Severe MAC with severe MR. -- Continue medical therapy.    Acute blood loss anemia: -- Hg stable at 10.4 on 02/08/24 labs (personally reviewed).  -- Continue iron.    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  -- A follow-up multiphase MRI or CT of the abdomen is suggested in one year. -- Discussed with pt and we will ask Dr. Cleatus to weigh in.       Medication Adjustments/Labs and Tests Ordered: Current medicines are reviewed at length with the patient today.  Concerns regarding medicines are outlined above.  Orders Placed This Encounter  Procedures   ECHOCARDIOGRAM COMPLETE   No orders of the defined types were placed in this encounter.   Patient Instructions  Medication Instructions:  Your physician recommends that you continue on your current medications as directed. Please refer to the Current Medication list given to you today.   *If you need a refill on your cardiac medications before your next appointment, please call your pharmacy*  Lab Work: NONE NEEDED If you have labs (blood work) drawn today and your tests are completely normal, you will receive your results only by: MyChart Message (if you have MyChart) OR A paper copy in the mail If you have any lab test that is abnormal or we need to change your treatment, we will call you to review the results.  Testing/Procedures: 12/2024 Your physician has requested that you have an echocardiogram. Echocardiography is a painless test that uses sound waves to create images of your heart. It provides your doctor with information about the size and shape of your heart and how well your heart's chambers and valves are working. This procedure takes approximately one hour. There are no restrictions for this procedure. Please do NOT wear cologne, perfume, aftershave, or lotions (deodorant is allowed). Please arrive 15 minutes prior to your appointment time.  Please note: We ask at that you not bring children with you during ultrasound (echo/ vascular) testing. Due to room size and safety concerns, children are not allowed in the ultrasound rooms during exams. Our front office staff cannot provide observation of children in our lobby area while testing is being conducted. An adult accompanying a patient to their appointment will only be allowed in the ultrasound room at the discretion of the ultrasound technician under special circumstances. We apologize for any inconvenience.   Follow-Up: At [facility], you and your health needs are our priority.  As part of our continuing mission to provide you with exceptional heart care, our providers are all part of one team.  This team includes your primary Cardiologist (physician) and Advanced Practice Providers or APPs (Physician Assistants and  Nurse Practitioners) who all work together to provide you with the care you need, when you  need it.  Your next appointment:   As scheduled  Provider:   Izetta Hummer, PA-C    We recommend signing up for the patient portal called MyChart.  Sign up information is provided on this After Visit Summary.  MyChart is used to connect with patients for Virtual Visits (Telemedicine).  Patients are able to view lab/test results, encounter notes, upcoming appointments, etc.  Non-urgent messages can be sent to your provider as well.   To learn more about what you can do with MyChart, go to [email or web address].        Signed, Lamarr Hummer, PA-C  02/15/2024 1:00 PM    [facility]

## 2024-02-14 ENCOUNTER — Ambulatory Visit (HOSPITAL_COMMUNITY)
Admission: RE | Admit: 2024-02-14 | Discharge: 2024-02-14 | Disposition: A | Discharge status: Home / Self Care | Source: Ambulatory Visit | Attending: Cardiology | Admitting: Cardiology

## 2024-02-14 ENCOUNTER — Ambulatory Visit: Attending: Physician Assistant | Admitting: Physician Assistant

## 2024-02-14 VITALS — BP 136/50 | HR 68 | Ht 62.0 in | Wt 165.4 lb

## 2024-02-14 DIAGNOSIS — K869 Disease of pancreas, unspecified: Secondary | ICD-10-CM | POA: Diagnosis not present

## 2024-02-14 DIAGNOSIS — I059 Rheumatic mitral valve disease, unspecified: Secondary | ICD-10-CM

## 2024-02-14 DIAGNOSIS — I5032 Chronic diastolic (congestive) heart failure: Secondary | ICD-10-CM | POA: Diagnosis not present

## 2024-02-14 DIAGNOSIS — Z952 Presence of prosthetic heart valve: Secondary | ICD-10-CM | POA: Diagnosis not present

## 2024-02-14 DIAGNOSIS — I447 Left bundle-branch block, unspecified: Secondary | ICD-10-CM

## 2024-02-14 DIAGNOSIS — D649 Anemia, unspecified: Secondary | ICD-10-CM

## 2024-02-14 DIAGNOSIS — I480 Paroxysmal atrial fibrillation: Secondary | ICD-10-CM

## 2024-02-14 DIAGNOSIS — I10 Essential (primary) hypertension: Secondary | ICD-10-CM | POA: Diagnosis not present

## 2024-02-14 NOTE — Patient Instructions (Signed)
 Medication Instructions:  Your physician recommends that you continue on your current medications as directed. Please refer to the Current Medication list given to you today.  *If you need a refill on your cardiac medications before your next appointment, please call your pharmacy*  Lab Work: NONE NEEDED If you have labs (blood work) drawn today and your tests are completely normal, you will receive your results only by: MyChart Message (if you have MyChart) OR A paper copy in the mail If you have any lab test that is abnormal or we need to change your treatment, we will call you to review the results.  Testing/Procedures: 12/2024 Your physician has requested that you have an echocardiogram. Echocardiography is a painless test that uses sound waves to create images of your heart. It provides your doctor with information about the size and shape of your heart and how well your heart's chambers and valves are working. This procedure takes approximately one hour. There are no restrictions for this procedure. Please do NOT wear cologne, perfume, aftershave, or lotions (deodorant is allowed). Please arrive 15 minutes prior to your appointment time.  Please note: We ask at that you not bring children with you during ultrasound (echo/ vascular) testing. Due to room size and safety concerns, children are not allowed in the ultrasound rooms during exams. Our front office staff cannot provide observation of children in our lobby area while testing is being conducted. An adult accompanying a patient to their appointment will only be allowed in the ultrasound room at the discretion of the ultrasound technician under special circumstances. We apologize for any inconvenience.   Follow-Up: At [facility], you and your health needs are our priority.  As part of our continuing mission to provide you with exceptional heart care, our providers are all part of one team.  This team includes your primary  Cardiologist (physician) and Advanced Practice Providers or APPs (Physician Assistants and Nurse Practitioners) who all work together to provide you with the care you need, when you need it.  Your next appointment:   As scheduled  Provider:   Izetta Hummer, PA-C    We recommend signing up for the patient portal called MyChart.  Sign up information is provided on this After Visit Summary.  MyChart is used to connect with patients for Virtual Visits (Telemedicine).  Patients are able to view lab/test results, encounter notes, upcoming appointments, etc.  Non-urgent messages can be sent to your provider as well.   To learn more about what you can do with MyChart, go to [email or web address].

## 2024-02-15 ENCOUNTER — Encounter: Payer: Self-pay | Admitting: Family Medicine

## 2024-02-15 ENCOUNTER — Ambulatory Visit: Payer: Self-pay | Admitting: Physician Assistant

## 2024-02-15 LAB — ECHOCARDIOGRAM COMPLETE
AR max vel: 1.27 cm2
AV Area VTI: 1.37 cm2
AV Area mean vel: 1.36 cm2
AV Mean grad: 9 mmHg
AV Peak grad: 19.7 mmHg
Ao pk vel: 2.22 m/s
Area-P 1/2: 2.61 cm2
S' Lateral: 2.8 cm

## 2024-02-17 ENCOUNTER — Other Ambulatory Visit: Payer: Self-pay | Admitting: Family Medicine

## 2024-02-17 MED ORDER — PANTOPRAZOLE SODIUM 40 MG PO TBEC
40.0000 mg | DELAYED_RELEASE_TABLET | Freq: Every day | ORAL | 1 refills | Status: DC
  Filled 2024-02-17: qty 30, 30d supply, fill #0
  Filled 2024-03-29: qty 30, 30d supply, fill #1

## 2024-02-18 ENCOUNTER — Other Ambulatory Visit: Payer: Self-pay

## 2024-02-18 ENCOUNTER — Other Ambulatory Visit (HOSPITAL_COMMUNITY): Payer: Self-pay

## 2024-02-29 ENCOUNTER — Other Ambulatory Visit: Payer: Self-pay | Admitting: Cardiology

## 2024-02-29 ENCOUNTER — Other Ambulatory Visit (HOSPITAL_COMMUNITY): Payer: Self-pay

## 2024-02-29 ENCOUNTER — Telehealth: Payer: Self-pay | Admitting: Cardiology

## 2024-02-29 ENCOUNTER — Other Ambulatory Visit: Payer: Self-pay

## 2024-02-29 DIAGNOSIS — I480 Paroxysmal atrial fibrillation: Secondary | ICD-10-CM

## 2024-02-29 MED ORDER — AMIODARONE HCL 200 MG PO TABS
100.0000 mg | ORAL_TABLET | Freq: Every day | ORAL | 3 refills | Status: DC
  Filled 2024-02-29 (×2): qty 45, 90d supply, fill #0

## 2024-02-29 NOTE — Telephone Encounter (Signed)
*  STAT* If patient is at the pharmacy, call can be transferred to refill team.   1. Which medications need to be refilled? (please list name of each medication and dose if known)   amiodarone  (PACERONE ) 200 MG tablet    2. Which pharmacy/location (including street and city if local pharmacy) is medication to be sent to?  [place] [facility]      3. Do they need a 30 day or 90 day supply? 90   Pt is out of medication

## 2024-02-29 NOTE — Telephone Encounter (Signed)
 Pt's medication was sent to pt's pharmacy as requested. Confirmation received.

## 2024-03-03 ENCOUNTER — Other Ambulatory Visit (HOSPITAL_COMMUNITY): Payer: Self-pay

## 2024-03-03 MED ORDER — AMIODARONE HCL 200 MG PO TABS
200.0000 mg | ORAL_TABLET | Freq: Every day | ORAL | 3 refills | Status: DC
  Filled 2024-03-03: qty 90, 90d supply, fill #0

## 2024-03-04 ENCOUNTER — Other Ambulatory Visit (HOSPITAL_COMMUNITY): Payer: Self-pay

## 2024-03-07 ENCOUNTER — Ambulatory Visit: Admitting: Family Medicine

## 2024-03-07 VITALS — BP 120/78 | HR 70 | Temp 97.8°F | Ht 62.0 in | Wt 171.6 lb

## 2024-03-07 DIAGNOSIS — E119 Type 2 diabetes mellitus without complications: Secondary | ICD-10-CM | POA: Diagnosis not present

## 2024-03-07 DIAGNOSIS — D649 Anemia, unspecified: Secondary | ICD-10-CM | POA: Diagnosis not present

## 2024-03-07 DIAGNOSIS — I480 Paroxysmal atrial fibrillation: Secondary | ICD-10-CM | POA: Diagnosis not present

## 2024-03-07 LAB — FOLATE: Folate: 18.2 ng/mL (ref >5.9)

## 2024-03-07 LAB — VITAMIN B12: Vitamin B-12: 1113 pg/mL — ABNORMAL HIGH (ref 211–911)

## 2024-03-07 LAB — FERRITIN: Ferritin: 71.1 ng/mL (ref 10.0–291.0)

## 2024-03-07 LAB — HEMOGLOBIN A1C: Hgb A1c MFr Bld: 7.9 % — ABNORMAL HIGH (ref 4.6–6.5)

## 2024-03-07 MED ORDER — AMIODARONE HCL 200 MG PO TABS: 100.0000 mg | ORAL_TABLET | Freq: Every day | ORAL | Status: DC

## 2024-03-07 NOTE — Patient Instructions (Signed)
 Go to the lab on the way out.   If you have mychart we'll likely use that to update you.    Don't change you meds for now.  Take care.  Glad to see you.

## 2024-03-07 NOTE — Progress Notes (Signed)
 D/w pt about persistent anemia.  Sugar had been lower until the last 10 days, most recently 140-150s.  It was prev 120-130s.  No meds for DM2.   D/w pt about ddx re: anemia, ie loss vs lack of absorption, lack of production, peripheral destruction.  No black stools like prev but darker and that is attributed to iron. No blood in stool. Diarrhea is resolved off metformin .  Discussed getting follow-up labs.  Not lightheaded on standing.   All questions about workup and treatment answered at the office visit.  Transfusion not indicated given her hemoglobin.  Discussed.  Detailed conversation.  Meds, vitals, and allergies reviewed.   ROS: Per HPI unless specifically indicated in ROS section   GEN: nad, alert and oriented HEENT: mucous membranes moist NECK: supple w/o LA CV: rrr.  SEM noted.  PULM: ctab, no inc wob ABD: soft, +bs EXT: no edema SKIN: Well-perfused.  32 minutes were devoted to patient care in this encounter (this includes time spent reviewing the patient's file/history, interviewing and examining the patient, counseling/reviewing plan with patient).

## 2024-03-10 ENCOUNTER — Ambulatory Visit: Payer: Self-pay | Admitting: Family Medicine

## 2024-03-10 DIAGNOSIS — D649 Anemia, unspecified: Secondary | ICD-10-CM

## 2024-03-10 NOTE — Assessment & Plan Note (Signed)
 See notes on follow-up labs.  No change in meds at this point.

## 2024-03-11 LAB — FRUCTOSAMINE: Fructosamine: 397 umol/L — ABNORMAL HIGH (ref 205–285)

## 2024-03-11 LAB — LACTATE DEHYDROGENASE: LDH: 271 U/L — ABNORMAL HIGH (ref 120–250)

## 2024-03-11 LAB — CBC WITH DIFFERENTIAL/PLATELET
Absolute Lymphocytes: 743 {cells}/uL — ABNORMAL LOW (ref 850–3900)
Absolute Monocytes: 727 {cells}/uL (ref 200–950)
Basophils Absolute: 47 {cells}/uL (ref 0–200)
Basophils Relative: 0.6 %
Eosinophils Absolute: 292 {cells}/uL (ref 15–500)
Eosinophils Relative: 3.7 %
HCT: 34.4 % — ABNORMAL LOW (ref 35.0–45.0)
Hemoglobin: 11.1 g/dL — ABNORMAL LOW (ref 11.7–15.5)
MCH: 29.4 pg (ref 27.0–33.0)
MCHC: 32.3 g/dL (ref 32.0–36.0)
MCV: 91.2 fL (ref 80.0–100.0)
MPV: 10.7 fL (ref 7.5–12.5)
Monocytes Relative: 9.2 %
Neutro Abs: 6091 {cells}/uL (ref 1500–7800)
Neutrophils Relative %: 77.1 %
Platelets: 334 Thousand/uL (ref 140–400)
RBC: 3.77 Million/uL — ABNORMAL LOW (ref 3.80–5.10)
RDW: 14.8 % (ref 11.0–15.0)
Total Lymphocyte: 9.4 %
WBC: 7.9 Thousand/uL (ref 3.8–10.8)

## 2024-03-11 LAB — RETICULOCYTES
ABS Retic: 82940 {cells}/uL — ABNORMAL HIGH (ref 20000–80000)
Retic Ct Pct: 2.2 %

## 2024-03-11 LAB — HAPTOGLOBIN: Haptoglobin: 10 mg/dL — ABNORMAL LOW (ref 43–212)

## 2024-03-12 ENCOUNTER — Other Ambulatory Visit: Payer: Self-pay

## 2024-03-12 ENCOUNTER — Other Ambulatory Visit (HOSPITAL_COMMUNITY): Payer: Self-pay

## 2024-03-12 ENCOUNTER — Other Ambulatory Visit: Payer: Self-pay | Admitting: Radiology

## 2024-03-12 DIAGNOSIS — D649 Anemia, unspecified: Secondary | ICD-10-CM

## 2024-03-12 LAB — FECAL OCCULT BLOOD, IMMUNOCHEMICAL: Fecal Occult Bld: NEGATIVE

## 2024-03-13 ENCOUNTER — Other Ambulatory Visit (HOSPITAL_COMMUNITY): Payer: Self-pay

## 2024-03-13 ENCOUNTER — Other Ambulatory Visit: Payer: Self-pay

## 2024-03-18 ENCOUNTER — Encounter: Payer: Self-pay | Admitting: Family Medicine

## 2024-03-26 NOTE — Progress Notes (Signed)
 [facility] - Clinic Note  03/28/2024     CHIEF COMPLAINT Patient presents for Retina Follow Up  HISTORY OF PRESENT ILLNESS: [facility] is a 83 y.o. 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  This started 7 weeks ago.  I, the attending physician,  performed the HPI with the patient and updated documentation appropriately.        Comments   Pt states when she looks directly down she will notice a floater/bubble like she sees when she has the injection, it only lasts a few seconds and she just waits for it to move. Pt uses AREDS. Pt denies any distortion/changes in vision. Pt has a few sparkles in her left eye but those have been around for years.      Last edited by Dana Rogue, MD on 03/28/2024  1:01 PM.     Patient states she saw a lot of floaters in the left eye after the last injection. The floaters lasted for about 2-3 weeks.   Referring physician: [name], MD [address] [place],  [place] [address]  HISTORICAL INFORMATION:  Selected notes from the MEDICAL RECORD NUMBER Referred by Dr. CANDIE [facility] for eval of ret heme OD   CURRENT MEDICATIONS: No current outpatient medications on file. (Ophthalmic Drugs)   No current facility-administered medications for this visit. (Ophthalmic Drugs)   Current Outpatient Medications (Other)  Medication Sig   amiodarone  (PACERONE ) 200 MG tablet Take 0.5 tablets (100 mg total) by mouth daily.   amoxicillin  (AMOXIL ) 500 MG capsule Take 4 capsules (2,000 mg total) by mouth 1 hour prior to dental work including cleanings as directed.   apixaban  (ELIQUIS ) 5 MG TABS tablet Take 1 tablet (5 mg total) by mouth 2 (two) times daily.   Ascorbic Acid (VITAMIN C PO) Take 500 mg by mouth in the morning.   atorvastatin  (LIPITOR) 40 MG tablet Take 1 tablet (40 mg total) by mouth daily at 6PM.   Cholecalciferol 25 MCG (1000 UT) capsule  Take 1,000 Units by mouth daily.   cyanocobalamin  (VITAMIN B12) 1000 MCG tablet Take 1 tablet (1,000 mcg total) by mouth 3 (three) times a week. Take on Mon-Wed-Fr.   Docosanol (ABREVA) 10 % CREA Apply 1 application  topically as needed (fever blister).   ferrous sulfate  325 (65 FE) MG tablet Take 1 tablet (325 mg total) by mouth 2 (two) times daily with a meal.   glucose blood (ONETOUCH VERIO) test strip use to test blood glucose levels in the morning, at noon, and at bedtime.   levothyroxine  (SYNTHROID ) 88 MCG tablet Take 1 tablet (88 mcg total) by mouth daily.   losartan -hydrochlorothiazide  (HYZAAR ) 50-12.5 MG tablet Take 1 tablet by mouth every morning.   Multiple Vitamins-Minerals (PRESERVISION AREDS 2 PO) Take 1 tablet by mouth 2 (two) times daily.    OneTouch Delica Lancets 33G MISC use one in the morning, at noon, and at bedtime to check blood glucose levels   pantoprazole  (PROTONIX ) 40 MG tablet Take 1 tablet (40 mg total) by mouth daily.   pyridOXINE (B-6) 50 MG tablet Take 50 mg by mouth daily.   rOPINIRole  (REQUIP ) 0.25 MG tablet Take 1 tablet (0.25 mg total) by mouth at bedtime.   valACYclovir  (VALTREX ) 500 MG tablet Take 1 tablet (500 mg total) by mouth daily as needed (fever blister).  No current facility-administered medications for this visit. (Other)   REVIEW OF SYSTEMS: ROS   Positive for: Endocrine, Cardiovascular, Eyes Negative for: Constitutional, Gastrointestinal, Neurological, Skin, Genitourinary, Musculoskeletal, HENT, Respiratory, Psychiatric, Allergic/Imm, Heme/Lymph Last edited by [facility], COT on 03/28/2024  8:24 AM.     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 Wonda and Dr. Shyrl   Severe aortic stenosis    Thyroid  disease    HYPOTHYROIDISM   Past Surgical History:  Procedure Laterality Date   ABDOMINAL HYSTERECTOMY  1977   TAH  (FIBROIDS)   CATARACT EXTRACTION Bilateral    COLONOSCOPY N/A 12/12/2023   Procedure: COLONOSCOPY;  Surgeon: Dana Lamprey, MD;  [facility];  Service: Gastroenterology;  Laterality: N/A;   DILATION AND CURETTAGE OF UTERUS     DOPPLER ECHOCARDIOGRAPHY  2009   HAD AN ECHOCARDIOGRAM THAT SHOWED MODERATE MITRAL  ANNULAR CALCIFICATION AND A NORMAL EJECTION FRACTION OF 55%   ESOPHAGOGASTRODUODENOSCOPY N/A 12/12/2023   Procedure: EGD (ESOPHAGOGASTRODUODENOSCOPY);  Surgeon: Dana Lamprey, MD;  Location: [facility];  Service: Gastroenterology;  Laterality: N/A;   GIVENS CAPSULE STUDY N/A 12/12/2023   Procedure: IMAGING PROCEDURE, GI TRACT, INTRALUMINAL, VIA CAPSULE;  Surgeon: Dana Lamprey, MD;  Location: [facility];  Service: Gastroenterology;  Laterality: N/A;   INTRAOPERATIVE TRANSTHORACIC ECHOCARDIOGRAM N/A 01/01/2024   Procedure: ECHOCARDIOGRAM, TRANSTHORACIC;  Surgeon: [facility] Sharper, MD;  Location: [facility] INVASIVE CV LAB;  Service: Cardiovascular;  Laterality: N/A;   RIGHT HEART CATH AND CORONARY ANGIOGRAPHY N/A 10/24/2023   Procedure: RIGHT HEART CATH AND CORONARY ANGIOGRAPHY;  Surgeon: Dana Heinz, MD;  Location: [facility] INVASIVE CV LAB;  Service: Cardiovascular;  Laterality: N/A;   SHOULDER SURGERY     RIGHT SHOULDER SURGERY   THYROIDECTOMY     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   RIGHT   TOTAL KNEE ARTHROPLASTY Left 09/10/2018   Procedure: TOTAL KNEE ARTHROPLASTY;  Surgeon: Dana Coy, MD;  Location: [facility];  Service: Orthopedics;  Laterality: Left;   FAMILY HISTORY Family History  Problem Relation Age of  Onset   Osteoporosis Mother    Heart disease Mother    Macular degeneration Mother    Heart disease Father    Cancer Father        BRAIN TUMOR   Cancer Brother        BRAIN TUMOR   Diabetes Maternal Aunt    Diabetes Maternal Uncle    Stroke Paternal Grandfather    Colon cancer Neg Hx    Breast cancer Neg Hx    Migraines Neg Hx    SOCIAL HISTORY Social History   Tobacco Use   Smoking status: Never   Smokeless tobacco: Never  Vaping Use   Vaping status: Never Used  Substance Use Topics   Alcohol use: No    Alcohol/week: 0.0 standard drinks of alcohol   Drug use: No  OPHTHALMIC EXAM: Base Eye Exam     Visual Acuity (Snellen - Linear)       Right Left   Dist [place] 20/40 -3 20/40 -1   Dist ph [place] 20/40 -1 20/30 -3         Tonometry (Tonopen, 8:33 AM)       Right Left   Pressure 19 21         Pupils       Pupils Dark Light Shape React APD   Right PERRL 3 2 Round Brisk None   Left PERRL 3 2 Round Brisk None         Visual Fields       Left Right    Full Full         Extraocular Movement       Right Left    Full, Ortho Full, Ortho         Neuro/Psych     Oriented x3: Yes   Mood/Affect: Normal         Dilation     Both eyes: 1.0% Mydriacyl, 2.5% Phenylephrine  @ 8:34 AM           Slit Lamp and Fundus Exam     Slit Lamp Exam       Right Left   Lids/Lashes Dermatochalasis - upper lid Dermatochalasis - upper lid   Conjunctiva/Sclera White and quiet White and quiet   Cornea 1+ Punctate epithelial erosions, fine endo pigment, trace tear film debris 1+ fine Punctate epithelial erosions, fine endo pigment, trace tear film debris   Anterior Chamber Deep and quiet Deep and quiet   Iris Round and dilated Round and dilated   Lens Toric PC IOL with marks at 1200 and 0600, 1+ Posterior capsular opacification nasal side approaching visual axis Toric PC IOL with marks at 1200 and 0600, 1+ Posterior capsular opacification   Anterior  Vitreous Vitreous syneresis, trace pigment, Posterior vitreous detachment, vitreous condensations Vitreous syneresis, Posterior vitreous detachment, vitreous condensations         Fundus Exam       Right Left   Disc mild Pallor, Sharp rim, +cupping, PPA mild Pallor, Sharp rim, +cupping, PPA   C/D Ratio 0.7 0.7   Macula Blunted foveal reflex, central CNV with subretinal heme -- stably resolved -- now with pigment clumping, RPE mottling, clumping and atrophy, Drusen, stable improvement in shallow SRF overlying CNV, trace cystic changes temporal mac -- stably improved Flat, Blunted foveal reflex, Drusen, RPE mottling, clumping and atrophy, focal IRH/SRH inferior to fovea -- stably improved, scattered patches of GA, no heme or fluid   Vessels attenuated, Tortuous attenuated, mild tortuosity   Periphery Attached, reticular degeneration, paving stone degeneration IT quad, No heme Attached, reticular degeneration, mild inferior paving stone degeneration, No heme           IMAGING AND PROCEDURES  Imaging and Procedures for 03/28/2024  OCT, Retina - OU - Both Eyes       Right Eye Quality was good. Central Foveal Thickness: 358. Progression has improved. Findings include no SRF, abnormal foveal contour, retinal drusen , subretinal hyper-reflective material, intraretinal fluid, pigment epithelial detachment, outer retinal atrophy (persistent PED, persistent IRF / cystic changes-- slightly improved, stable improvement in SRF overlying PED, +ORA).   Left Eye Quality was good. Central Foveal Thickness: 221. Progression has been stable. Findings include normal foveal contour, no IRF, no SRF, retinal drusen , outer retinal atrophy (Stable improvement in focal central SRHM ).  Notes *Images captured and stored on drive  Diagnosis / Impression:  Ex ARMD OU OD: persistent PED, persistent IRF / cystic changes- slightly improved, stable improvement in SRF overlying PED, +ORA OS: Stable improvement in  focal central [facility]   Clinical management:  See below  Abbreviations: NFP - Normal foveal profile. CME - cystoid macular edema. PED - pigment epithelial detachment. IRF - intraretinal fluid. SRF - subretinal fluid. EZ - ellipsoid zone. ERM - epiretinal membrane. ORA - outer retinal atrophy. ORT - outer retinal tubulation. SRHM - subretinal hyper-reflective material. IRHM - intraretinal hyper-reflective material      Intravitreal Injection, Pharmacologic Agent - OD - Right Eye       Time Out 03/28/2024. 8:50 AM. Confirmed correct patient, procedure, site, and patient consented.   Anesthesia Topical anesthesia was used. Anesthetic medications included Lidocaine  2%, Proparacaine 0.5%.   Procedure Preparation included 5% betadine to ocular surface, eyelid speculum. A supplied (32g) needle was used.   Injection: 6 mg faricimab -svoa 6 MG/0.05ML (Patient supplied)   Route: Intravitreal, Site: Right Eye   NDC: 49757-903-93, Lot: [id number], Expiration date: 04/08/2025, Waste: 0 mL   Post-op Post injection exam found visual acuity of at least counting fingers. The patient tolerated the procedure well. There were no complications. The patient received written and verbal post procedure care education. Post injection medications were not given.   Notes **PAP MEDICATION ADMINISTERED**      Intravitreal Injection, Pharmacologic Agent - OS - Left Eye       Time Out 03/28/2024. 8:51 AM. Confirmed correct patient, procedure, site, and patient consented.   Anesthesia Topical anesthesia was used. Anesthetic medications included Lidocaine  2%, Proparacaine 0.5%.   Procedure Preparation included 5% betadine to ocular surface, eyelid speculum. A supplied (33g) needle was used.   Injection: 6 mg faricimab -svoa 6 MG/0.05ML (Patient supplied)   Route: Intravitreal, Site: Left Eye   NDC: 49757-903-93, Lot: [id number], Expiration date: 04/08/2025, Waste: 0 mL   Post-op Post injection exam found  visual acuity of at least counting fingers. The patient tolerated the procedure well. There were no complications. The patient received written and verbal post procedure care education.   Notes **PAP MEDICATION ADMINISTERED**            ASSESSMENT/PLAN:   ICD-10-CM   1. Exudative age-related macular degeneration of right eye with active choroidal neovascularization (HCC)  H35.3211 OCT, Retina - OU - Both Eyes    Intravitreal Injection, Pharmacologic Agent - OD - Right Eye    faricimab -svoa (VABYSMO ) 6mg /0.60mL intravitreal injection    2. Exudative age-related macular degeneration of left eye with active choroidal neovascularization (HCC)  H35.3221 OCT, Retina - OU - Both Eyes    Intravitreal Injection, Pharmacologic Agent - OS - Left Eye    faricimab -svoa (VABYSMO ) 6mg /0.26mL intravitreal injection    3. Essential hypertension  I10     4. Hypertensive retinopathy of both eyes  H35.033     5. Pseudophakia, both eyes  Z96.1     6. PCO (posterior capsule opacification), bilateral  H26.493       1. Exudative age related macular degeneration, OD - s/p IVA OD #1 (05.06.22), #2 (06.03.22), #3 (07.01.22), #4 (07.29.22), #5 (08.26.22), #6 (09.23.22), #7 (10.21.22), #8 (11.18.22), #9 (12.16.22), #10 (01.20.23) -- IVA resistance ===================================== - s/p IVE OD #1 (02.24.23) -- sample, #2 (03.24.23), #3 (04.21.23), #4 (05.19.23), #5 (06.16.23), #6 (07.21.23), #7 (09.01.23), #8 (10.27.23), #9 (12.15.23), #10 (02.02.24), #11 (03.29.24), #12 (06.06.24) #13 (07.26.24), #14 (09.13.24), #15 (10.31.24), #16 (12.20.24) --  IVE resistance ====================================== - s/p IVV OD #1 (02.07.25, sample), #2 (03.21.25, sample), #3 (05.02.25, sample), #4 (06.16.25, sample), #6 (08.01.25 sample) - OCT shows  OD: persistent PED, persistent IRF / cystic changes- slightly improved, stable improvement in SRF overlying PED, +ORA at 7 weeks  - BCVA 20/40 OD  - recommend IVV OD #7  (PAP) today, 09.19.25 w/ f/u 7 wks  - RBA of procedure discussed, questions answered - see procedure note - IVV informed consent obtained and signed, 02.07.25 (OD)  - f/u 7 wks -- DFE/OCT, possible injection  2. Exudative age related macular degeneration, left eye   - early conversion to exudative ARMD -- noted on 01.04.24 - s/p IVA OS #1 (01.04.24), #2 (02.02.24), #3 (03.01.24), #4 (03.29.24), #5 (05.03.24), #6 (06.06.24), #7 (07.26.24), #8 (09.13.24), #9 (10.31.24), #10 (12.20.24), #11 (02.07.25), #12 (06.16.25) #13 (08.01.2025)  - BCVA OS 20/30 from 20/40  - OCT shows OS: Stable improvement in focal central SRHM at 7 weeks - recommend IVV OS #1 (PAP) today, 09.19.25 - pt wishes to proceed with injection - RBA of procedure discussed, questions answered - informed consent obtained and signed - see procedure note - IVV informed consent obtained and signed, 09.19.25 (OS)  - f/u in 7 wks, DFE, OCT, possible injxns  3,4. Hypertensive retinopathy OU - discussed importance of tight BP control - monitor  5. Pseudophakia OU  - s/p CE/IOL  - IOL in good position, doing well  - monitor   6. PCO OU (OD > OS)  - continue to monitor   Ophthalmic Meds Ordered this visit:  Meds ordered this encounter  Medications   faricimab -svoa (VABYSMO ) 6mg /0.4mL intravitreal injection   faricimab -svoa (VABYSMO ) 6mg /0.38mL intravitreal injection     Return in about 7 weeks (around 05/16/2024) for f/u, Ex. AMD, DFE, OCT, Possible, IVV, OU.  There are no Patient Instructions on file for this visit.   This document serves as a record of services personally performed by Redell JUDITHANN Hans, MD, PhD. It was created on their behalf by Delon Newness COT, an ophthalmic technician. The creation of this record is the provider's dictation and/or activities during the visit.    Electronically signed by: Delon Newness COT 09.17.2025 4:18 PM  This document serves as a record of services personally  performed by Redell JUDITHANN Hans, MD, PhD. It was created on their behalf by Wanda GEANNIE Keens, COT an ophthalmic technician. The creation of this record is the provider's dictation and/or activities during the visit.    Electronically signed by:  Wanda GEANNIE Keens, COT  04/05/24 4:18 PM  Redell JUDITHANN Hans, M.D., Ph.D. Diseases & Surgery of the Retina and Vitreous [facility] 03/28/2024   I have reviewed the above documentation for accuracy and completeness, and I agree with the above. Redell JUDITHANN Hans, M.D., Ph.D. 04/05/24 4:20 PM   Abbreviations: M myopia (nearsighted); A astigmatism; H hyperopia (farsighted); P presbyopia; Mrx spectacle prescription;  CTL contact lenses; OD right eye; OS left eye; OU both eyes  XT exotropia; ET esotropia; PEK punctate epithelial keratitis; PEE punctate epithelial erosions; DES dry eye syndrome; MGD meibomian gland dysfunction; ATs artificial tears; PFAT's preservative free artificial tears; NSC nuclear sclerotic cataract; PSC posterior subcapsular cataract; ERM epi-retinal membrane; PVD posterior vitreous detachment; RD retinal detachment; DM diabetes mellitus; DR diabetic retinopathy; NPDR non-proliferative diabetic retinopathy; PDR proliferative diabetic retinopathy; CSME clinically significant macular edema; DME diabetic macular edema; dbh dot blot hemorrhages; CWS cotton wool spot; POAG primary open angle glaucoma;  C/D cup-to-disc ratio; HVF humphrey visual field; GVF goldmann visual field; OCT optical coherence tomography; IOP intraocular pressure; BRVO Branch retinal vein occlusion; CRVO central retinal vein occlusion; CRAO central retinal artery occlusion; BRAO branch retinal artery occlusion; RT retinal tear; SB scleral buckle; PPV pars plana vitrectomy; VH Vitreous hemorrhage; PRP panretinal laser photocoagulation; IVK intravitreal kenalog; VMT vitreomacular traction; MH Macular hole;  NVD neovascularization of the disc; NVE  neovascularization elsewhere; AREDS age related eye disease study; ARMD age related macular degeneration; POAG primary open angle glaucoma; EBMD epithelial/anterior basement membrane dystrophy; ACIOL anterior chamber intraocular lens; IOL intraocular lens; PCIOL posterior chamber intraocular lens; Phaco/IOL phacoemulsification with intraocular lens placement; PRK photorefractive keratectomy; LASIK laser assisted in situ keratomileusis; HTN hypertension; DM diabetes mellitus; COPD chronic obstructive pulmonary disease

## 2024-03-28 ENCOUNTER — Ambulatory Visit (INDEPENDENT_AMBULATORY_CARE_PROVIDER_SITE_OTHER): Admitting: Ophthalmology

## 2024-03-28 ENCOUNTER — Encounter (INDEPENDENT_AMBULATORY_CARE_PROVIDER_SITE_OTHER): Payer: Self-pay | Admitting: Ophthalmology

## 2024-03-28 DIAGNOSIS — H353231 Exudative age-related macular degeneration, bilateral, with active choroidal neovascularization: Secondary | ICD-10-CM

## 2024-03-28 DIAGNOSIS — H353221 Exudative age-related macular degeneration, left eye, with active choroidal neovascularization: Secondary | ICD-10-CM

## 2024-03-28 DIAGNOSIS — I10 Essential (primary) hypertension: Secondary | ICD-10-CM

## 2024-03-28 DIAGNOSIS — Z961 Presence of intraocular lens: Secondary | ICD-10-CM | POA: Diagnosis not present

## 2024-03-28 DIAGNOSIS — H35033 Hypertensive retinopathy, bilateral: Secondary | ICD-10-CM | POA: Diagnosis not present

## 2024-03-28 DIAGNOSIS — H353211 Exudative age-related macular degeneration, right eye, with active choroidal neovascularization: Secondary | ICD-10-CM

## 2024-03-28 DIAGNOSIS — H26493 Other secondary cataract, bilateral: Secondary | ICD-10-CM | POA: Diagnosis not present

## 2024-03-28 MED ORDER — FARICIMAB-SVOA 6 MG/0.05ML IZ SOSY
6.0000 mg | PREFILLED_SYRINGE | INTRAVITREAL | Status: AC | PRN
  Administered 2024-03-28: 6 mg via INTRAVITREAL

## 2024-03-29 ENCOUNTER — Other Ambulatory Visit (HOSPITAL_COMMUNITY): Payer: Self-pay

## 2024-04-07 ENCOUNTER — Other Ambulatory Visit (HOSPITAL_COMMUNITY): Payer: Self-pay

## 2024-04-07 ENCOUNTER — Other Ambulatory Visit: Payer: Self-pay | Admitting: Cardiology

## 2024-04-07 ENCOUNTER — Other Ambulatory Visit: Payer: Self-pay

## 2024-04-07 DIAGNOSIS — I10 Essential (primary) hypertension: Secondary | ICD-10-CM

## 2024-04-07 MED ORDER — LOSARTAN POTASSIUM-HCTZ 50-12.5 MG PO TABS
1.0000 | ORAL_TABLET | ORAL | 2 refills | Status: DC
  Filled 2024-04-07: qty 90, 90d supply, fill #0
  Filled 2024-07-06: qty 90, 90d supply, fill #1

## 2024-04-09 ENCOUNTER — Other Ambulatory Visit

## 2024-04-09 DIAGNOSIS — D649 Anemia, unspecified: Secondary | ICD-10-CM | POA: Diagnosis not present

## 2024-04-09 LAB — LACTATE DEHYDROGENASE: LDH: 239 U/L (ref 120–250)

## 2024-04-10 ENCOUNTER — Ambulatory Visit: Payer: Self-pay | Admitting: Family Medicine

## 2024-04-10 ENCOUNTER — Encounter: Payer: Self-pay | Admitting: Family Medicine

## 2024-04-10 DIAGNOSIS — D649 Anemia, unspecified: Secondary | ICD-10-CM

## 2024-04-10 LAB — CBC WITH DIFFERENTIAL/PLATELET
Basophils Absolute: 0.1 K/uL (ref 0.0–0.1)
Basophils Relative: 1.4 % (ref 0.0–3.0)
Eosinophils Absolute: 0.2 K/uL (ref 0.0–0.7)
Eosinophils Relative: 2.3 % (ref 0.0–5.0)
HCT: 27.1 % — ABNORMAL LOW (ref 36.0–46.0)
Hemoglobin: 9.2 g/dL — ABNORMAL LOW (ref 12.0–15.0)
Lymphocytes Relative: 10.9 % — ABNORMAL LOW (ref 12.0–46.0)
Lymphs Abs: 1.1 K/uL (ref 0.7–4.0)
MCHC: 33.9 g/dL (ref 30.0–36.0)
MCV: 90.6 fl (ref 78.0–100.0)
Monocytes Absolute: 1.1 K/uL — ABNORMAL HIGH (ref 0.1–1.0)
Monocytes Relative: 10.3 % (ref 3.0–12.0)
Neutro Abs: 7.7 K/uL (ref 1.4–7.7)
Neutrophils Relative %: 75.1 % (ref 43.0–77.0)
Platelets: 375 K/uL (ref 150.0–400.0)
RBC: 2.99 Mil/uL — ABNORMAL LOW (ref 3.87–5.11)
RDW: 14.8 % (ref 11.5–15.5)
WBC: 10.3 K/uL (ref 4.0–10.5)

## 2024-04-10 LAB — FERRITIN: Ferritin: 74.4 ng/mL (ref 10.0–291.0)

## 2024-04-11 ENCOUNTER — Encounter: Payer: Self-pay | Admitting: *Deleted

## 2024-04-11 ENCOUNTER — Encounter: Payer: Self-pay | Admitting: Cardiology

## 2024-04-11 DIAGNOSIS — K922 Gastrointestinal hemorrhage, unspecified: Secondary | ICD-10-CM

## 2024-04-11 DIAGNOSIS — I480 Paroxysmal atrial fibrillation: Secondary | ICD-10-CM

## 2024-04-11 NOTE — Telephone Encounter (Signed)
 Copied from CRM #[id number]. Topic: Referral - Question >> Apr 11, 2024 10:06 AM Mercedes MATSU wrote: Reason for CRM: Patient had a question for the referral coordinator, ashland greene. Patient is requesting a call back and can be reached at [phone].

## 2024-04-11 NOTE — Telephone Encounter (Signed)
 [facility] Referral for Left Atrial Appendage Closure with Non-Valvular Atrial Fibrillation   [name] is a 83 y.o. female is being referred to the [facility] Team for evaluation for Left Atrial Appendage Closure with Watchman device for the management of stroke risk resulting form non-valvular atrial fibrillation.    Base upon [name]'s history, she is felt to be a poor candidate for long-term anticoagulation because of a history of bleeding (e.g. intracerebral, subdural, GI, retro-peritoneal).  The patient has a HAS-BLED score of 4 indicating a Yearly Major Bleeding Risk of 8.7%.      Her CHADS2-VASc Score is 5 with an unadjusted Ischemic Stroke Rate (% per year) of 7.2%.    Her stroke risk necessitates a strategy of stroke prevention with either long-term oral anticoagulation or left atrial appendage occlusion therapy. We have discussed their bleeding risk in the context of their comorbid medical problems, as well as the rationale for referral for evaluation of Watchman left atrial appendage occlusion therapy. While the patient is at high long-term bleeding risk, they may be appropriate for short-term anticoagulation. Based on this individual patient's stroke and bleeding risk, a shared decision has been made to refer the patient for consideration of Watchman left atrial appendage closure utilizing the [employer] shared decision tool.  Patient has been treated for GI bleed in the past and again now having dark melanotic stools, Eliquis  is on hold.    ICD-10-CM   1. Paroxysmal atrial fibrillation (HCC)  I48.0 Ambulatory referral to Cardiac Electrophysiology    2. Lower GI bleed  K92.2 Ambulatory referral to Cardiac Electrophysiology     Orders Placed This Encounter  Procedures   Ambulatory referral to Cardiac Electrophysiology    Referral Priority:   Routine    Referral Type:   Consultation    Referral Reason:   Specialty  Services Required    Requested Specialty:   Cardiology    Number of Visits Requested:   1      Gordy Bergamo, MD, [facility] 04/11/2024, 3:22 PM [facility] [address] [place], [place] [address] Phone: [phone]. Fax:  [phone]

## 2024-04-11 NOTE — Telephone Encounter (Signed)
 Please call pt about labs and get update on her symptoms.  Thanks.

## 2024-04-11 NOTE — Telephone Encounter (Signed)
 Documented made in another encounter

## 2024-04-15 DIAGNOSIS — D509 Iron deficiency anemia, unspecified: Secondary | ICD-10-CM | POA: Diagnosis not present

## 2024-04-15 DIAGNOSIS — K219 Gastro-esophageal reflux disease without esophagitis: Secondary | ICD-10-CM | POA: Diagnosis not present

## 2024-04-16 ENCOUNTER — Other Ambulatory Visit: Payer: Self-pay | Admitting: Family Medicine

## 2024-04-16 ENCOUNTER — Other Ambulatory Visit (HOSPITAL_COMMUNITY): Payer: Self-pay

## 2024-04-16 MED ORDER — FERROUS SULFATE 325 (65 FE) MG PO TABS
325.0000 mg | ORAL_TABLET | Freq: Two times a day (BID) | ORAL | 3 refills | Status: AC
  Filled 2024-04-16: qty 60, 30d supply, fill #0
  Filled 2024-05-20: qty 60, 30d supply, fill #1
  Filled 2024-06-17: qty 60, 30d supply, fill #2
  Filled 2024-07-06: qty 60, 30d supply, fill #3

## 2024-04-16 NOTE — Telephone Encounter (Signed)
 Sent. Thanks.

## 2024-04-16 NOTE — Telephone Encounter (Signed)
 Not prescribed by you. Do you want to fill it?

## 2024-04-17 ENCOUNTER — Other Ambulatory Visit (HOSPITAL_COMMUNITY): Payer: Self-pay

## 2024-04-17 ENCOUNTER — Encounter: Payer: Self-pay | Admitting: Family Medicine

## 2024-04-17 ENCOUNTER — Other Ambulatory Visit: Payer: Self-pay

## 2024-04-20 ENCOUNTER — Other Ambulatory Visit: Payer: Self-pay | Admitting: Family Medicine

## 2024-04-20 DIAGNOSIS — D649 Anemia, unspecified: Secondary | ICD-10-CM

## 2024-04-20 NOTE — Telephone Encounter (Signed)
 Please call referrals and make sure they are processing her hematology referral as a stat referral.  I would like hematology input about potential iron infusions.

## 2024-04-21 NOTE — Telephone Encounter (Signed)
 Referrals team has been made aware hematology referral is placed as STAT/Urgent.

## 2024-04-24 ENCOUNTER — Telehealth: Payer: Self-pay | Admitting: Cardiology

## 2024-04-24 NOTE — Telephone Encounter (Signed)
 Not to start Eliquis  and to keep the appointment with Watchman device otherwise there will be delay in care. Thank you

## 2024-04-24 NOTE — Telephone Encounter (Signed)
  Pt c/o medication issue:  1. Name of Medication: Eliquis    2. How are you currently taking this medication (dosage and times per day)?   3. Are you having a reaction (difficulty breathing--STAT)? No   4. What is your medication issue? Patient said she's been off of her Eliquis   since 10/03. She wanted to ask Dr. Ladona if that is ok or she need to star taking it again

## 2024-04-24 NOTE — Telephone Encounter (Signed)
 Patient would like advice on restarting Eliquis . Per pt she has been off of Eliquis  for 2 weeks. Patient verbalized she is going to [facility] for referral  made to hematology/ oncology. Patient reports some dark stool. Patient verbalized she does not feel fatigue and denies any active bleeding or bruising.  Per patient she received call about Watchman and patient verbalized she would like to wait and would like to talk to Dr. Ladona before proceeding with Watchman. Made pt aware that this will be forward to provider for advice. Pt verbalized understanding

## 2024-04-25 ENCOUNTER — Ambulatory Visit: Payer: Self-pay | Admitting: Oncology

## 2024-04-25 ENCOUNTER — Encounter: Payer: Self-pay | Admitting: Oncology

## 2024-04-25 ENCOUNTER — Inpatient Hospital Stay: Attending: Oncology | Admitting: Oncology

## 2024-04-25 ENCOUNTER — Inpatient Hospital Stay

## 2024-04-25 VITALS — BP 118/54 | HR 64 | Temp 97.6°F | Resp 18 | Ht 62.0 in | Wt 170.0 lb

## 2024-04-25 DIAGNOSIS — M1990 Unspecified osteoarthritis, unspecified site: Secondary | ICD-10-CM | POA: Insufficient documentation

## 2024-04-25 DIAGNOSIS — I10 Essential (primary) hypertension: Secondary | ICD-10-CM | POA: Diagnosis not present

## 2024-04-25 DIAGNOSIS — Z79890 Hormone replacement therapy (postmenopausal): Secondary | ICD-10-CM | POA: Insufficient documentation

## 2024-04-25 DIAGNOSIS — Z809 Family history of malignant neoplasm, unspecified: Secondary | ICD-10-CM | POA: Diagnosis not present

## 2024-04-25 DIAGNOSIS — Z79899 Other long term (current) drug therapy: Secondary | ICD-10-CM | POA: Insufficient documentation

## 2024-04-25 DIAGNOSIS — D649 Anemia, unspecified: Secondary | ICD-10-CM | POA: Insufficient documentation

## 2024-04-25 DIAGNOSIS — E119 Type 2 diabetes mellitus without complications: Secondary | ICD-10-CM | POA: Insufficient documentation

## 2024-04-25 DIAGNOSIS — N1832 Chronic kidney disease, stage 3b: Secondary | ICD-10-CM | POA: Diagnosis not present

## 2024-04-25 DIAGNOSIS — I350 Nonrheumatic aortic (valve) stenosis: Secondary | ICD-10-CM | POA: Insufficient documentation

## 2024-04-25 DIAGNOSIS — K589 Irritable bowel syndrome without diarrhea: Secondary | ICD-10-CM | POA: Insufficient documentation

## 2024-04-25 DIAGNOSIS — E785 Hyperlipidemia, unspecified: Secondary | ICD-10-CM | POA: Diagnosis not present

## 2024-04-25 DIAGNOSIS — Z90710 Acquired absence of both cervix and uterus: Secondary | ICD-10-CM | POA: Insufficient documentation

## 2024-04-25 DIAGNOSIS — E039 Hypothyroidism, unspecified: Secondary | ICD-10-CM | POA: Diagnosis not present

## 2024-04-25 DIAGNOSIS — I4891 Unspecified atrial fibrillation: Secondary | ICD-10-CM | POA: Diagnosis not present

## 2024-04-25 DIAGNOSIS — Z7901 Long term (current) use of anticoagulants: Secondary | ICD-10-CM | POA: Insufficient documentation

## 2024-04-25 LAB — COMPREHENSIVE METABOLIC PANEL WITH GFR
ALT: 34 U/L (ref 0–44)
AST: 39 U/L (ref 15–41)
Albumin: 3.8 g/dL (ref 3.5–5.0)
Alkaline Phosphatase: 73 U/L (ref 38–126)
Anion gap: 10 (ref 5–15)
BUN: 14 mg/dL (ref 8–23)
CO2: 25 mmol/L (ref 22–32)
Calcium: 8.5 mg/dL — ABNORMAL LOW (ref 8.9–10.3)
Chloride: 101 mmol/L (ref 98–111)
Creatinine, Ser: 1.34 mg/dL — ABNORMAL HIGH (ref 0.44–1.00)
GFR, Estimated: 39 mL/min — ABNORMAL LOW (ref >60)
Glucose, Bld: 161 mg/dL — ABNORMAL HIGH (ref 70–99)
Potassium: 3.8 mmol/L (ref 3.5–5.1)
Sodium: 136 mmol/L (ref 135–145)
Total Bilirubin: 1 mg/dL (ref 0.0–1.2)
Total Protein: 6.2 g/dL — ABNORMAL LOW (ref 6.5–8.1)

## 2024-04-25 LAB — CBC WITH DIFFERENTIAL/PLATELET
Abs Immature Granulocytes: 0 K/uL (ref 0.00–0.07)
Band Neutrophils: 0 %
Basophils Absolute: 0.1 K/uL (ref 0.0–0.1)
Basophils Relative: 1 %
Blasts: 0 %
Eosinophils Absolute: 0.2 K/uL (ref 0.0–0.5)
Eosinophils Relative: 3 %
HCT: 25.9 % — ABNORMAL LOW (ref 36.0–46.0)
Hemoglobin: 8.7 g/dL — ABNORMAL LOW (ref 12.0–15.0)
Immature Granulocytes: 0 %
Lymphocytes Relative: 9 %
Lymphs Abs: 0.7 K/uL (ref 0.7–4.0)
MCH: 31.8 pg (ref 26.0–34.0)
MCHC: 33.6 g/dL (ref 30.0–36.0)
MCV: 94.5 fL (ref 80.0–100.0)
Metamyelocytes Relative: 0 %
Monocytes Absolute: 0.8 K/uL (ref 0.1–1.0)
Monocytes Relative: 10 %
Myelocytes: 0 %
Neutro Abs: 5.7 K/uL (ref 1.7–7.7)
Neutrophils Relative %: 77 %
Other: 0 %
Platelets: 363 K/uL (ref 150–400)
Promyelocytes Relative: 0 %
RBC: 2.74 MIL/uL — ABNORMAL LOW (ref 3.87–5.11)
RDW: 15.7 % — ABNORMAL HIGH (ref 11.5–15.5)
WBC: 7.5 K/uL (ref 4.0–10.5)
nRBC: 0 % (ref 0.0–0.2)
nRBC: 0 /100{WBCs}

## 2024-04-25 LAB — IRON AND TIBC
Iron: 97 ug/dL (ref 28–170)
Saturation Ratios: 35 % — ABNORMAL HIGH (ref 10.4–31.8)
TIBC: 279 ug/dL (ref 250–450)
UIBC: 182 ug/dL

## 2024-04-25 LAB — TECHNOLOGIST SMEAR REVIEW: Plt Morphology: ADEQUATE

## 2024-04-25 LAB — RETIC PANEL
Immature Retic Fract: 17.6 % — ABNORMAL HIGH (ref 2.3–15.9)
RBC.: 2.75 MIL/uL — ABNORMAL LOW (ref 3.87–5.11)
Retic Count, Absolute: 137.8 K/uL (ref 19.0–186.0)
Retic Ct Pct: 5 % — ABNORMAL HIGH (ref 0.4–3.1)
Reticulocyte Hemoglobin: 33.4 pg (ref >27.9)

## 2024-04-25 LAB — LACTATE DEHYDROGENASE: LDH: 208 U/L — ABNORMAL HIGH (ref 98–192)

## 2024-04-25 LAB — VITAMIN B12: Vitamin B-12: 1189 pg/mL — ABNORMAL HIGH (ref 180–914)

## 2024-04-25 LAB — FERRITIN: Ferritin: 56 ng/mL (ref 11–307)

## 2024-04-25 LAB — FOLATE: Folate: 15.7 ng/mL (ref >5.9)

## 2024-04-25 NOTE — Assessment & Plan Note (Signed)
 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  Check CBC, smear, multiple myeloma panel, light chain ratio, LDH, iron TIBC ferritin, vitamin B12, folate, CMP, haptoglobin.  Lab Results  Component Value Date   HGB 8.7 (L) 04/25/2024   TIBC 279 04/25/2024   IRONPCTSAT 35 (H) 04/25/2024   FERRITIN 56 04/25/2024    I recommend patient to get IV Venofer weekly x 4 to further increase iron stores.

## 2024-04-25 NOTE — Assessment & Plan Note (Signed)
 Encourage oral hydration and avoid nephrotoxins.

## 2024-04-25 NOTE — Progress Notes (Signed)
 Hematology/Oncology Consult note [phone] [phone]        REFERRING PROVIDER: [name], MD   CHIEF COMPLAINTS/REASON FOR VISIT:  Evaluation of anemia   ASSESSMENT & PLAN:   Normocytic anemia Anemia is likely due to chronic kidney disease, rule out other etiologies.  Check CBC, smear, multiple myeloma panel, light chain ratio, LDH, iron TIBC ferritin, vitamin B12, folate, CMP, haptoglobin.  Lab Results  Component Value Date   HGB 8.7 (L) 04/25/2024   TIBC 279 04/25/2024   IRONPCTSAT 35 (H) 04/25/2024   FERRITIN 56 04/25/2024    I recommend patient to get IV Venofer weekly x 4 to further increase iron stores.  Chronic kidney disease, stage 3b (HCC) Encourage oral hydration and avoid nephrotoxins.    Orders Placed This Encounter  Procedures   CBC with Differential/Platelet    Standing Status:   Future    Number of Occurrences:   1    Expected Date:   04/25/2024    Expiration Date:   07/24/2024   Comprehensive metabolic panel with GFR    Standing Status:   Future    Number of Occurrences:   1    Expected Date:   04/25/2024    Expiration Date:   07/24/2024   Iron and TIBC    Standing Status:   Future    Number of Occurrences:   1    Expected Date:   04/25/2024    Expiration Date:   07/24/2024   Ferritin    Standing Status:   Future    Number of Occurrences:   1    Expected Date:   04/25/2024    Expiration Date:   07/24/2024   Retic Panel    Standing Status:   Future    Number of Occurrences:   1    Expected Date:   04/25/2024    Expiration Date:   07/24/2024   Vitamin B12    Standing Status:   Future    Number of Occurrences:   1    Expected Date:   04/25/2024    Expiration Date:   07/24/2024   Folate    Standing Status:   Future    Number of Occurrences:   1    Expected Date:   04/25/2024    Expiration Date:   07/24/2024   Kappa/lambda light chains    Standing Status:   Future    Number of Occurrences:   1    Expected  Date:   04/25/2024    Expiration Date:   07/24/2024   Multiple Myeloma Panel (SPEP&IFE w/QIG)    Standing Status:   Future    Number of Occurrences:   1    Expected Date:   04/25/2024    Expiration Date:   07/24/2024   Lactate dehydrogenase    Standing Status:   Future    Number of Occurrences:   1    Expected Date:   04/25/2024    Expiration Date:   07/24/2024   Haptoglobin    Standing Status:   Future    Number of Occurrences:   1    Expected Date:   04/25/2024    Expiration Date:   07/24/2024   Technologist smear review    Standing Status:   Future    Number of Occurrences:   1    Expected Date:   04/25/2024    Expiration Date:   07/24/2024    Clinical information::   anemia  Follow up in few weeks to review results.   All questions were answered. The patient knows to call the clinic with any problems, questions or concerns.  Zelphia Cap, MD, PhD [facility] Hematology Oncology 04/25/2024   HISTORY OF PRESENTING ILLNESS:   Dana Sandoval is a  83 y.o.  female with PMH listed below was seen in consultation at the request of  [name], MD  for evaluation of anemia.  Discussed the use of AI scribe software for clinical note transcription with the patient, who gave verbal consent to proceed.   She has a history of chronic anemia with fluctuating hemoglobin levels since 2012. Her hemoglobin dropped to a critically low level of 4.8 g/dL in June 2025, necessitating a blood transfusion. Despite undergoing a colonoscopy, endoscopy, and capsule study, no active gastrointestinal bleeding was identified.  Her hemoglobin improved post-transfusion but began to decline again two weeks ago. No current symptoms of shortness of breath, lightheadedness, or chest pain. She is currently taking iron supplements twice daily.  She has severe aortic valve stenosis. June 2025 She underwent aortic valve replacement   Her creatinine was 1.11 mg/dL and her estimated GFR was 49 mL/min/1.51m as of  August 2025. In June 2025, her creatinine was 1.17 mg/dL and her eGFR was 46 fO/fpw/8.26f. She was previously on Lasix  but is now taking losartan  and chlorothiazide.  She has a history of atrial fibrillation and is on Eliquis  5 mg twice daily, which she has been off for two weeks per cardiology advise. She has no history of blood clots. She also has macular degeneration and is scheduled for an ophthalmology appointment in November 2025.    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 Wonda and Dr. Shyrl   Severe aortic stenosis    Thyroid  disease    HYPOTHYROIDISM    SURGICAL HISTORY: Past Surgical History:  Procedure Laterality Date   ABDOMINAL HYSTERECTOMY  1977   TAH  (FIBROIDS)   CATARACT EXTRACTION Bilateral    COLONOSCOPY N/A 12/12/2023   Procedure: COLONOSCOPY;  Surgeon: Kristie Lamprey, MD;  Location: [facility] ENDOSCOPY;  Service: Gastroenterology;  Laterality: N/A;   DILATION AND CURETTAGE OF UTERUS     DOPPLER ECHOCARDIOGRAPHY  2009   HAD AN ECHOCARDIOGRAM THAT SHOWED MODERATE MITRAL  ANNULAR CALCIFICATION AND A NORMAL EJECTION FRACTION OF 55%   ESOPHAGOGASTRODUODENOSCOPY N/A 12/12/2023   Procedure: EGD (ESOPHAGOGASTRODUODENOSCOPY);  Surgeon: Kristie Lamprey, MD;  Location: [facility] ENDOSCOPY;  Service: Gastroenterology;  Laterality: N/A;   GIVENS CAPSULE STUDY N/A 12/12/2023   Procedure: IMAGING PROCEDURE, GI TRACT, INTRALUMINAL, VIA CAPSULE;  Surgeon: Kristie Lamprey, MD;  Location: [facility] ENDOSCOPY;  Service: Gastroenterology;  Laterality: N/A;   INTRAOPERATIVE TRANSTHORACIC ECHOCARDIOGRAM N/A 01/01/2024   Procedure: ECHOCARDIOGRAM, TRANSTHORACIC;  Surgeon: Wonda Sharper, MD;   Location: [facility] INVASIVE CV LAB;  Service: Cardiovascular;  Laterality: N/A;   RIGHT HEART CATH AND CORONARY ANGIOGRAPHY N/A 10/24/2023   Procedure: RIGHT HEART CATH AND CORONARY ANGIOGRAPHY;  Surgeon: Ladona Heinz, MD;  Location: [facility] INVASIVE CV LAB;  Service: Cardiovascular;  Laterality: N/A;   SHOULDER SURGERY  RIGHT SHOULDER SURGERY   THYROIDECTOMY     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   RIGHT   TOTAL KNEE ARTHROPLASTY Left 09/10/2018   Procedure: TOTAL KNEE ARTHROPLASTY;  Surgeon: Sheril Coy, MD;  Location: [facility] ORS;  Service: Orthopedics;  Laterality: Left;    SOCIAL HISTORY: Social History   Socioeconomic History   Marital status: Married    Spouse name: Not on file   Number of children: 4   Years of education: Not on file   Highest education level: 12th grade  Occupational History   Not on file  Tobacco Use   Smoking status: Never   Smokeless tobacco: Never  Vaping Use   Vaping status: Never Used  Substance and Sexual Activity   Alcohol use: No    Alcohol/week: 0.0 standard drinks of alcohol   Drug use: No   Sexual activity: Yes    Partners: Male    Birth control/protection: Surgical    Comment: 1st intercourse- 50, partners- 1, married- 57 yrs   Other Topics Concern   Not on file  Social History Narrative   Lives with husband, Dana Sandoval- daughter, documents at home   Married 1960   Social Drivers of Health   Financial Resource Strain: Low Risk  (12/10/2023)   Overall Financial Resource Strain (CARDIA)    Difficulty of Paying Living Expenses: Not hard at all  Food Insecurity: No Food Insecurity (04/24/2024)   Hunger Vital Sign    Worried About Running Out of Food in the Last Year: Never true    Ran Out of Food in the Last Year: Never true  Transportation Needs: No Transportation Needs (04/24/2024)   PRAPARE - Administrator, Civil Service (Medical): No    Lack of Transportation (Non-Medical): No  Physical Activity: Inactive (12/10/2023)    Exercise Vital Sign    Days of Exercise per Week: 0 days    Minutes of Exercise per Session: 10 min  Stress: Stress Concern Present (12/10/2023)   [employer] of Occupational Health - Occupational Stress Questionnaire    Feeling of Stress : Very much  Social Connections: Unknown (01/01/2024)   Social Connection and Isolation Panel    Frequency of Communication with Friends and Family: More than three times a week    Frequency of Social Gatherings with Friends and Family: More than three times a week    Attends Religious Services: Not on Marketing executive or Organizations: Not on file    Attends Banker Meetings: Not on file    Marital Status: Not on file  Recent Concern: Social Connections - Moderately Isolated (12/11/2023)   Social Connection and Isolation Panel    Frequency of Communication with Friends and Family: More than three times a week    Frequency of Social Gatherings with Friends and Family: More than three times a week    Attends Religious Services: Never    Database administrator or Organizations: No    Attends Banker Meetings: Never    Marital Status: Married  Catering manager Violence: Not At Risk (01/01/2024)   Humiliation, Afraid, Rape, and Kick questionnaire    Fear of Current or Ex-Partner: No    Emotionally Abused: No    Physically Abused: No    Sexually Abused: No    FAMILY HISTORY: Family History  Problem Relation Age of Onset   Osteoporosis Mother    Heart disease Mother    Macular degeneration  Mother    Heart disease Father    Cancer Father        BRAIN TUMOR   Cancer Brother        BRAIN TUMOR   Diabetes Maternal Aunt    Diabetes Maternal Uncle    Stroke Paternal Grandfather    Colon cancer Neg Hx    Breast cancer Neg Hx    Migraines Neg Hx     ALLERGIES:  is allergic to metformin  and related, codeine, and dilaudid [hydromorphone hcl].  MEDICATIONS:  Current Outpatient Medications  Medication Sig  Dispense Refill   amiodarone  (PACERONE ) 200 MG tablet Take 0.5 tablets (100 mg total) by mouth daily.     apixaban  (ELIQUIS ) 5 MG TABS tablet Take 1 tablet (5 mg total) by mouth 2 (two) times daily. 180 tablet 1   Ascorbic Acid (VITAMIN C PO) Take 500 mg by mouth in the morning.     atorvastatin  (LIPITOR) 40 MG tablet Take 1 tablet (40 mg total) by mouth daily at 6PM. 90 tablet 3   Cholecalciferol 25 MCG (1000 UT) capsule Take 1,000 Units by mouth daily.     cyanocobalamin  (VITAMIN B12) 1000 MCG tablet Take 1 tablet (1,000 mcg total) by mouth 3 (three) times a week. Take on Mon-Wed-Fr.     Docosanol (ABREVA) 10 % CREA Apply 1 application  topically as needed (fever blister).     ferrous sulfate  (FEROSUL) 325 (65 FE) MG tablet Take 1 tablet (325 mg total) by mouth 2 (two) times daily with a meal. 60 tablet 3   glucose blood (ONETOUCH VERIO) test strip use to test blood glucose levels in the morning, at noon, and at bedtime. 300 each 3   levothyroxine  (SYNTHROID ) 88 MCG tablet Take 1 tablet (88 mcg total) by mouth daily. 90 tablet 3   losartan -hydrochlorothiazide  (HYZAAR ) 50-12.5 MG tablet Take 1 tablet by mouth every morning. 90 tablet 2   Multiple Vitamins-Minerals (PRESERVISION AREDS 2 PO) Take 1 tablet by mouth 2 (two) times daily.      OneTouch Delica Lancets 33G MISC use one in the morning, at noon, and at bedtime to check blood glucose levels 300 each 3   pantoprazole  (PROTONIX ) 40 MG tablet Take 1 tablet (40 mg total) by mouth daily. 30 tablet 1   pyridOXINE (B-6) 50 MG tablet Take 50 mg by mouth daily.     valACYclovir  (VALTREX ) 500 MG tablet Take 1 tablet (500 mg total) by mouth daily as needed (fever blister). 30 tablet 2   amoxicillin  (AMOXIL ) 500 MG capsule Take 4 capsules (2,000 mg total) by mouth 1 hour prior to dental work including cleanings as directed. (Patient not taking: Reported on 04/25/2024) 12 capsule 6   rOPINIRole  (REQUIP ) 0.25 MG tablet Take 1 tablet (0.25 mg total) by  mouth at bedtime. (Patient not taking: Reported on 04/25/2024) 30 tablet 1   No current facility-administered medications for this visit.    Review of Systems - Oncology PHYSICAL EXAMINATION:  Vitals:   04/25/24 1116  BP: (!) 118/54  Pulse: 64  Resp: 18  Temp: 97.6 F (36.4 C)  SpO2: 100%   Filed Weights   04/25/24 1116  Weight: 170 lb (77.1 kg)    Physical Exam  LABORATORY DATA:  I have reviewed the data as listed    Latest Ref Rng & Units 04/25/2024   11:48 AM 04/09/2024   11:12 AM 03/07/2024   10:30 AM  CBC  WBC 4.0 - 10.5 K/uL 7.5  10.3  7.9   Hemoglobin 12.0 - 15.0 g/dL 8.7  9.2  88.8   Hematocrit 36.0 - 46.0 % 25.9  27.1  34.4   Platelets 150 - 400 K/uL 363  375.0  334       Latest Ref Rng & Units 04/25/2024   11:48 AM 02/08/2024    7:54 AM 01/02/2024    3:17 AM  CMP  Glucose 70 - 99 mg/dL 838  851  871   BUN 8 - 23 mg/dL 14  16  13    Creatinine 0.44 - 1.00 mg/dL 8.65  8.88  9.00   Sodium 135 - 145 mmol/L 136  141  138   Potassium 3.5 - 5.1 mmol/L 3.8  4.2  3.9   Chloride 98 - 111 mmol/L 101  103  103   CO2 22 - 32 mmol/L 25  23  24    Calcium  8.9 - 10.3 mg/dL 8.5  8.5  8.5   Total Protein 6.5 - 8.1 g/dL 6.2     Total Bilirubin 0.0 - 1.2 mg/dL 1.0     Alkaline Phos 38 - 126 U/L 73     AST 15 - 41 U/L 39     ALT 0 - 44 U/L 34         RADIOGRAPHIC STUDIES: I have personally reviewed the radiological images as listed and agreed with the findings in the report. Intravitreal Injection, Pharmacologic Agent - OD - Right Eye Result Date: 03/28/2024 Time Out 03/28/2024. 8:50 AM. Confirmed correct patient, procedure, site, and patient consented. Anesthesia Topical anesthesia was used. Anesthetic medications included Lidocaine  2%, Proparacaine 0.5%. Procedure Preparation included 5% betadine to ocular surface, eyelid speculum. A supplied (32g) needle was used. Injection: 6 mg faricimab -svoa 6 MG/0.05ML (Patient supplied)   Route: Intravitreal, Site: Right Eye   NDC:  49757-903-93, Lot: [id number], Expiration date: 04/08/2025, Waste: 0 mL Post-op Post injection exam found visual acuity of at least counting fingers. The patient tolerated the procedure well. There were no complications. The patient received written and verbal post procedure care education. Post injection medications were not given. Notes **PAP MEDICATION ADMINISTERED**   Intravitreal Injection, Pharmacologic Agent - OS - Left Eye Result Date: 03/28/2024 Time Out 03/28/2024. 8:51 AM. Confirmed correct patient, procedure, site, and patient consented. Anesthesia Topical anesthesia was used. Anesthetic medications included Lidocaine  2%, Proparacaine 0.5%. Procedure Preparation included 5% betadine to ocular surface, eyelid speculum. A supplied (33g) needle was used. Injection: 6 mg faricimab -svoa 6 MG/0.05ML (Patient supplied)   Route: Intravitreal, Site: Left Eye   NDC: 49757-903-93, Lot: [id number], Expiration date: 04/08/2025, Waste: 0 mL Post-op Post injection exam found visual acuity of at least counting fingers. The patient tolerated the procedure well. There were no complications. The patient received written and verbal post procedure care education. Notes **PAP MEDICATION ADMINISTERED**   OCT, Retina - OU - Both Eyes Result Date: 03/28/2024 Right Eye Quality was good. Central Foveal Thickness: 358. Progression has improved. Findings include no SRF, abnormal foveal contour, retinal drusen , subretinal hyper-reflective material, intraretinal fluid, pigment epithelial detachment, outer retinal atrophy (persistent PED, persistent IRF / cystic changes-- slightly improved, stable improvement in SRF overlying PED, +ORA). Left Eye Quality was good. Central Foveal Thickness: 221. Progression has been stable. Findings include normal foveal contour, no IRF, no SRF, retinal drusen , outer retinal atrophy (Stable improvement in focal central SRHM ). 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 CME - cystoid macular edema. PED - pigment epithelial detachment. IRF - intraretinal fluid. SRF - subretinal fluid. EZ - ellipsoid zone. ERM - epiretinal membrane. ORA - outer retinal atrophy. ORT - outer retinal tubulation. SRHM - subretinal hyper-reflective material. IRHM - intraretinal hyper-reflective material   ECHOCARDIOGRAM COMPLETE Result Date: 02/15/2024    ECHOCARDIOGRAM REPORT   Patient Name:   ARAIYAH CUMPTON Appenzeller Date of Exam: 02/14/2024 Medical Rec #:  [id number]        Height:       62.0 in Accession #:    [id number]       Weight:       165.0 lb Date of Birth:  [birth year]       BSA:          1.762 m Patient Age:    82 years         BP:           157/64 mmHg Patient Gender: F                HR:           73 bpm. Exam Location:  [address] Procedure: 2D Echo, Cardiac Doppler and Color Doppler (Both Spectral and Color            Flow Doppler were utilized during procedure). Indications:    Z95.2 S/p TAVR  History:        Patient has prior history of Echocardiogram examinations, most                 recent 01/02/2024. S/p TAVR (23mm Celestia MOLDER),                 Arrythmias:Paroxysmal atrial fibrillation and LBBB,                 Signs/Symptoms:MR; Risk Factors:Hypertension, Dyslipidemia and                 Diabetes.  Sonographer:    Elsie Celestia RDCS Referring Phys: [id number] KATHRYN R THOMPSON IMPRESSIONS  1. Left ventricular ejection fraction, by estimation, is 65 to 70%. The left ventricle has normal function. The left ventricle has no regional wall motion abnormalities. There is moderate concentric left ventricular hypertrophy. Left ventricular diastolic parameters are indeterminate.  2. Right ventricular systolic function is normal. The right ventricular size is  normal. There is moderately elevated pulmonary artery systolic pressure. The estimated right ventricular systolic pressure is 52.3 mmHg.  3. Left atrial size was moderately dilated.  4. The mitral valve is abnormal. Severe mitral valve regurgitation. No evidence of mitral stenosis. Severe mitral annular calcification.  5. Tricuspid valve regurgitation is mild to moderate.  6. The aortic valve has been repaired/replaced. Aortic valve regurgitation is not visualized. No aortic stenosis is present. Echo findings are consistent with normal structure and function of the aortic valve prosthesis. Aortic valve area, by VTI measures 1.37 cm. Aortic valve mean gradient measures 9.0 mmHg. Aortic valve Vmax measures 2.22 m/s.  7. The inferior vena cava is normal in size with greater than 50% respiratory variability, suggesting right atrial pressure of 3 mmHg. FINDINGS  Left Ventricle: Left ventricular ejection fraction, by estimation, is 65 to 70%. The left ventricle has normal function. The left ventricle has no regional wall motion abnormalities. The left ventricular internal cavity size was normal in size.  There is  moderate concentric left ventricular hypertrophy. Left ventricular diastolic parameters are indeterminate. Right Ventricle: The right ventricular size is normal. No increase in right ventricular wall thickness. Right ventricular systolic function is normal. There is moderately elevated pulmonary artery systolic pressure. The tricuspid regurgitant velocity is 3.51 m/s, and with an assumed right atrial pressure of 3 mmHg, the estimated right ventricular systolic pressure is 52.3 mmHg. Left Atrium: Left atrial size was moderately dilated. Right Atrium: Right atrial size was normal in size. Pericardium: There is no evidence of pericardial effusion. Mitral Valve: The mitral valve is abnormal. There is moderate thickening of the mitral valve leaflet(s). Severe mitral annular calcification. Severe mitral valve  regurgitation, with anteriorly-directed jet. No evidence of mitral valve stenosis. Tricuspid Valve: The tricuspid valve is normal in structure. Tricuspid valve regurgitation is mild to moderate. No evidence of tricuspid stenosis. Aortic Valve: The aortic valve has been repaired/replaced. Aortic valve regurgitation is not visualized. No aortic stenosis is present. Aortic valve mean gradient measures 9.0 mmHg. Aortic valve peak gradient measures 19.7 mmHg. Aortic valve area, by VTI  measures 1.37 cm. There is a 23 mm Sapien prosthetic, stented (TAVR) valve present in the aortic position. Echo findings are consistent with normal structure and function of the aortic valve prosthesis. Pulmonic Valve: The pulmonic valve was normal in structure. Pulmonic valve regurgitation is trivial. No evidence of pulmonic stenosis. Aorta: The aortic root is normal in size and structure. Venous: The inferior vena cava is normal in size with greater than 50% respiratory variability, suggesting right atrial pressure of 3 mmHg. IAS/Shunts: No atrial level shunt detected by color flow Doppler.  LEFT VENTRICLE PLAX 2D LVIDd:         3.90 cm   Diastology LVIDs:         2.80 cm   LV e' medial:    5.44 cm/s LV PW:         1.20 cm   LV E/e' medial:  29.0 LV IVS:        1.50 cm   LV e' lateral:   7.97 cm/s LVOT diam:     1.85 cm   LV E/e' lateral: 19.8 LV SV:         63 LV SV Index:   36 LVOT Area:     2.69 cm  RIGHT VENTRICLE             IVC RV S prime:     14.00 cm/s  IVC diam: 1.00 cm TAPSE (M-mode): 2.3 cm RVSP:           52.3 mmHg LEFT ATRIUM             Index        RIGHT ATRIUM           Index LA diam:        3.60 cm 2.04 cm/m   RA Pressure: 3.00 mmHg LA Vol (A2C):   47.9 ml 27.19 ml/m  RA Area:     12.20 cm LA Vol (A4C):   69.6 ml 39.51 ml/m  RA Volume:   29.40 ml  16.69 ml/m LA Biplane Vol: 62.1 ml 35.25 ml/m  AORTIC VALVE AV Area (Vmax):    1.27 cm AV Area (Vmean):   1.36 cm AV Area (VTI):     1.37 cm AV Vmax:            221.67 cm/s AV Vmean:          135.333 cm/s AV  VTI:            0.458 m AV Peak Grad:      19.7 mmHg AV Mean Grad:      9.0 mmHg LVOT Vmax:         105.00 cm/s LVOT Vmean:        68.300 cm/s LVOT VTI:          0.233 m LVOT/AV VTI ratio: 0.51  AORTA Ao Root diam: 2.80 cm Ao Asc diam:  3.20 cm MITRAL VALVE                TRICUSPID VALVE MV Area (PHT): 2.61 cm     TR Peak grad:   49.3 mmHg MV Decel Time: 291 msec     TR Vmax:        351.00 cm/s MV E velocity: 158.00 cm/s  Estimated RAP:  3.00 mmHg MV A velocity: 112.00 cm/s  RVSP:           52.3 mmHg MV E/A ratio:  1.41                             SHUNTS                             Systemic VTI:  0.23 m                             Systemic Diam: 1.85 cm Toribio Fuel MD Electronically signed by Toribio Fuel MD Signature Date/Time: 02/15/2024/1:09:50 AM    Final    Intravitreal Injection, Pharmacologic Agent - OD - Right Eye Result Date: 02/13/2024 Time Out 02/08/2024. 9:15 AM. Confirmed correct patient, procedure, site, and patient consented. Anesthesia Topical anesthesia was used. Anesthetic medications included Lidocaine  2%, Proparacaine 0.5%. Procedure Preparation included 5% betadine to ocular surface, eyelid speculum. A supplied (32g) needle was used. Injection: 6 mg faricimab -svoa 6 MG/0.05ML (Patient supplied)   Route: Intravitreal, Site: Right Eye   NDC: 49757-903-98, Lot: [id number], Expiration date: 04/08/2026, Waste: 0 mL Post-op Post injection exam found visual acuity of at least counting fingers. The patient tolerated the procedure well. There were no complications. The patient received written and verbal post procedure care education. Post injection medications were not given. Notes **SAMPLE MEDICATION ADMINISTERED**   Intravitreal Injection, Pharmacologic Agent - OS - Left Eye Result Date: 02/09/2024 Time Out 02/08/2024. 9:15 AM. Confirmed correct patient, procedure, site, and patient consented. Anesthesia Topical anesthesia was used. Anesthetic  medications included Lidocaine  2%, Proparacaine 0.5%. Procedure Preparation included 5% betadine to ocular surface, eyelid speculum. A supplied (33g) needle was used. Injection: 1.25 mg Bevacizumab  1.25mg /0.53ml   Route: Intravitreal, Site: Left Eye   NDC: [id number], [id number]: [id number]$ , Expiration date: 03/02/2024 Post-op Post injection exam found visual acuity of at least counting fingers. The patient tolerated the procedure well. There were no complications. The patient received written and verbal post procedure care education.   OCT, Retina - OU - Both Eyes Result Date: 02/09/2024 Right Eye Quality was good. Central Foveal Thickness: 348. Progression has been stable. Findings include no SRF, abnormal foveal contour, retinal drusen , subretinal hyper-reflective material, intraretinal fluid, pigment epithelial detachment, outer retinal atrophy (persistent PED, persistent IRF / cystic changes, stable improvement in SRF overlying PED, +ORA). Left Eye Quality was good. Central Foveal Thickness: 231. Progression has been stable. Findings include normal foveal contour,  no IRF, no SRF, retinal drusen , outer retinal atrophy (Stable improvement in focal central SRHM ). Notes *Images captured and stored on drive Diagnosis / Impression: Ex ARMD OU OD: persistent PED, persistent IRF / cystic changes, stable improvement in SRF overlying PED, +ORA OS: Stable improvement in focal central [facility] Clinical management: See below Abbreviations: NFP - Normal foveal profile. CME - cystoid macular edema. PED - pigment epithelial detachment. IRF - intraretinal fluid. SRF - subretinal fluid. EZ - ellipsoid zone. ERM - epiretinal membrane. ORA - outer retinal atrophy. ORT - outer retinal tubulation. SRHM - subretinal hyper-reflective material. IRHM - intraretinal hyper-reflective material   LONG TERM MONITOR-LIVE TELEMETRY (3-14 DAYS) Result Date: 01/30/2024 Images from the original result were not included. Remote EKG monitoring for 2  weeks starting 01/02/2024 for PAF: Predominant rhythm was sinus rhythm.  First-degree AV block was present.  Minimum heart rate was 44 bpm, maximum of 90 bpm with average heartbeat of 67 bpm. Paroxysmal episodes of atrial fibrillation occurred (3% burden) with average heart rate of 97 bpm, longest lasting 8 hours and 48 minutes. Occasional PVCs and PACs.

## 2024-04-27 LAB — HAPTOGLOBIN: Haptoglobin: 76 mg/dL (ref 41–333)

## 2024-04-28 ENCOUNTER — Telehealth: Payer: Self-pay

## 2024-04-28 LAB — KAPPA/LAMBDA LIGHT CHAINS
Kappa free light chain: 29.5 mg/L — ABNORMAL HIGH (ref 3.3–19.4)
Kappa, lambda light chain ratio: 1.33 (ref 0.26–1.65)
Lambda free light chains: 22.2 mg/L (ref 5.7–26.3)

## 2024-04-28 NOTE — Telephone Encounter (Signed)
 Spoke to pt and informed her of about plan to move appt appt and to add venofer to visit. Pt aware and would like a call to set up appt.   Please call pt to Move up follow-up appt to be in 1-2 weeks MD/ Venofer

## 2024-04-28 NOTE — Telephone Encounter (Signed)
-----   Message from Zelphia Cap sent at 04/25/2024  9:41 PM EDT ----- Hb is trending low. Please move up her follow up to 1-2 weeks MD, add Venofer.  ----- Message ----- From: Rebecka, Lab In [place] Sent: 04/25/2024  12:00 PM EDT To: Zelphia Cap, MD

## 2024-04-28 NOTE — Telephone Encounter (Signed)
 Per Dr. Babara, Dana Sandoval is under her care and decided to cancel her appts with Dr. Tina. I spoke with Roderica and she is aware of her cancelled appointment.

## 2024-04-29 ENCOUNTER — Inpatient Hospital Stay

## 2024-04-29 ENCOUNTER — Inpatient Hospital Stay: Admitting: Oncology

## 2024-04-29 LAB — MULTIPLE MYELOMA PANEL, SERUM
Albumin SerPl Elph-Mcnc: 3.3 g/dL (ref 2.9–4.4)
Albumin/Glob SerPl: 1.3 (ref 0.7–1.7)
Alpha 1: 0.3 g/dL (ref 0.0–0.4)
Alpha2 Glob SerPl Elph-Mcnc: 0.7 g/dL (ref 0.4–1.0)
B-Globulin SerPl Elph-Mcnc: 0.8 g/dL (ref 0.7–1.3)
Gamma Glob SerPl Elph-Mcnc: 0.8 g/dL (ref 0.4–1.8)
Globulin, Total: 2.6 g/dL (ref 2.2–3.9)
IgA: 131 mg/dL (ref 64–422)
IgG (Immunoglobin G), Serum: 928 mg/dL (ref 586–1602)
IgM (Immunoglobulin M), Srm: 45 mg/dL (ref 26–217)
Total Protein ELP: 5.9 g/dL — ABNORMAL LOW (ref 6.0–8.5)

## 2024-05-01 ENCOUNTER — Inpatient Hospital Stay

## 2024-05-01 ENCOUNTER — Inpatient Hospital Stay: Admitting: Oncology

## 2024-05-01 ENCOUNTER — Encounter: Payer: Self-pay | Admitting: Oncology

## 2024-05-01 ENCOUNTER — Telehealth: Payer: Self-pay

## 2024-05-01 VITALS — BP 119/53 | HR 65 | Temp 98.2°F | Resp 19 | Ht 62.0 in | Wt 172.0 lb

## 2024-05-01 VITALS — BP 131/56 | HR 63 | Temp 98.0°F | Resp 17

## 2024-05-01 DIAGNOSIS — D649 Anemia, unspecified: Secondary | ICD-10-CM

## 2024-05-01 DIAGNOSIS — N1832 Chronic kidney disease, stage 3b: Secondary | ICD-10-CM

## 2024-05-01 MED ORDER — SODIUM CHLORIDE 0.9% FLUSH
10.0000 mL | Freq: Once | INTRAVENOUS | Status: AC | PRN
  Administered 2024-05-01: 10 mL
  Filled 2024-05-01: qty 10

## 2024-05-01 MED ORDER — IRON SUCROSE 20 MG/ML IV SOLN
200.0000 mg | Freq: Once | INTRAVENOUS | Status: AC
  Administered 2024-05-01: 200 mg via INTRAVENOUS
  Filled 2024-05-01: qty 10

## 2024-05-01 NOTE — Assessment & Plan Note (Signed)
 Encourage oral hydration and avoid nephrotoxins.

## 2024-05-01 NOTE — Telephone Encounter (Signed)
 Chicken wing with long neck Max 20.5/ AVG 18/ Depth 16 Likely use a 24mm device Inf/Mid TSP RAO 15 CAU 12

## 2024-05-01 NOTE — Assessment & Plan Note (Addendum)
 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 Normal folate. Normal haptoglobin.  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   Lab Results  Component Value Date   HGB 8.7 (L) 04/25/2024   TIBC 279 04/25/2024   IRONPCTSAT 35 (H) 04/25/2024   FERRITIN 56 04/25/2024    I recommend IV venofer to improve iron store.  I discussed the potential risks including but not limited to allergic reactions/infusion reactions including anaphylactic reactions, diarrhea, phlebitis, high blood pressure, wheezing, SOB, skin rash, weight gain,dark urine, leg swelling, back pain, headache, nausea and fatigue, etc. Patient agrees witht plan. Plan IV venofer weekly x 3 If no improvement of Hb, consider erythropoietin therapy replacement. Rationale and side effects were reviewed with patient.  She agrees with the plan.

## 2024-05-01 NOTE — Progress Notes (Signed)
 Patient would like to know possible side effects from infusion.

## 2024-05-01 NOTE — Progress Notes (Signed)
 Hematology/Oncology Progress note [phone] [phone]           REFERRING PROVIDER: [name], MD   CHIEF COMPLAINTS/REASON FOR VISIT:  anemia   ASSESSMENT & PLAN:   Normocytic anemia Anemia is likely due to chronic kidney disease, can not rule out MDS Negative M protein, normal light chain ratio. Normal folate. Normal haptoglobin.  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   Lab Results  Component Value Date   HGB 8.7 (L) 04/25/2024   TIBC 279 04/25/2024   IRONPCTSAT 35 (H) 04/25/2024   FERRITIN 56 04/25/2024    I recommend IV venofer to improve iron store.  I discussed the potential risks including but not limited to allergic reactions/infusion reactions including anaphylactic reactions, diarrhea, phlebitis, high blood pressure, wheezing, SOB, skin rash, weight gain,dark urine, leg swelling, back pain, headache, nausea and fatigue, etc. Patient agrees witht plan. Plan IV venofer weekly x 3 If no improvement of Hb, consider erythropoietin therapy replacement. Rationale and side effects were reviewed with patient.  She agrees with the plan.    Chronic kidney disease, stage 3b (HCC) Encourage oral hydration and avoid nephrotoxins.    Orders Placed This Encounter  Procedures   CBC with Differential (Cancer Center Only)    Standing Status:   Future    Expected Date:   08/01/2024    Expiration Date:   10/30/2024   Iron and TIBC    Standing Status:   Future    Expected Date:   08/01/2024    Expiration Date:   10/30/2024   Ferritin    Standing Status:   Future    Expected Date:   08/01/2024    Expiration Date:   10/30/2024   Retic Panel    Standing Status:   Future    Expected Date:   08/01/2024    Expiration Date:   10/30/2024   Hemoglobin and Hematocrit (Cancer Center Only)    Standing Status:   Future    Expected Date:   05/15/2024    Expiration Date:   08/13/2024   Follow up 3 months All  questions were answered. The patient knows to call the clinic with any problems, questions or concerns.  Zelphia Cap, MD, PhD [facility] Hematology Oncology 05/01/2024   HISTORY OF PRESENTING ILLNESS:   Dana Sandoval is a  83 y.o.  female with PMH listed below was seen in consultation at the request of  [name], MD  for evaluation of anemia.  Discussed the use of AI scribe software for clinical note transcription with the patient, who gave verbal consent to proceed.   She has a history of chronic anemia with fluctuating hemoglobin levels since 2012. Her hemoglobin dropped to a critically low level of 4.8 g/dL in June 2025, necessitating a blood transfusion. Despite undergoing a colonoscopy, endoscopy, and capsule study, no active gastrointestinal bleeding was identified.  Her hemoglobin improved post-transfusion but began to decline again two weeks ago. No current symptoms of shortness of breath, lightheadedness, or chest pain. She is currently taking iron supplements twice daily.  She has severe aortic valve stenosis. June 2025 She underwent aortic valve replacement   Her creatinine was 1.11 mg/dL and her estimated GFR was 49 mL/min/1.43m as of August 2025. In June 2025, her creatinine was 1.17 mg/dL and her eGFR was 46 fO/fpw/8.26f. She was previously on Lasix  but is now taking losartan  and chlorothiazide.  She has  a history of atrial fibrillation and is on Eliquis  5 mg twice daily, which she has been off for two weeks per cardiology advise. She has no history of blood clots. She also has macular degeneration and is scheduled for an ophthalmology appointment in November 2025.   INTERVAL HISTORY Dana Sandoval is a 83 y.o. female who has above history reviewed by me today presents for follow up visit for  anemia.  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 Wonda and Dr. Shyrl   Severe aortic stenosis    Thyroid  disease    HYPOTHYROIDISM    SURGICAL HISTORY: Past Surgical History:  Procedure Laterality Date   ABDOMINAL HYSTERECTOMY  1977   TAH  (FIBROIDS)   CATARACT EXTRACTION Bilateral    COLONOSCOPY N/A 12/12/2023   Procedure: COLONOSCOPY;  Surgeon: Kristie Lamprey, MD;  Location: [facility] ENDOSCOPY;  Service: Gastroenterology;  Laterality: N/A;   DILATION AND CURETTAGE OF UTERUS     DOPPLER ECHOCARDIOGRAPHY  2009   HAD AN ECHOCARDIOGRAM THAT SHOWED MODERATE MITRAL  ANNULAR CALCIFICATION AND A NORMAL EJECTION FRACTION OF 55%   ESOPHAGOGASTRODUODENOSCOPY N/A 12/12/2023   Procedure: EGD (ESOPHAGOGASTRODUODENOSCOPY);  Surgeon: Kristie Lamprey, MD;  Location: [facility] ENDOSCOPY;  Service: Gastroenterology;  Laterality: N/A;   GIVENS CAPSULE STUDY N/A 12/12/2023   Procedure: IMAGING PROCEDURE, GI TRACT, INTRALUMINAL, VIA CAPSULE;  Surgeon: Kristie Lamprey, MD;  Location: [facility] ENDOSCOPY;  Service: Gastroenterology;  Laterality: N/A;   INTRAOPERATIVE TRANSTHORACIC ECHOCARDIOGRAM N/A 01/01/2024   Procedure: ECHOCARDIOGRAM, TRANSTHORACIC;  Surgeon: Wonda Sharper, MD;  Location: [facility] INVASIVE CV LAB;  Service: Cardiovascular;  Laterality: N/A;   RIGHT HEART CATH AND CORONARY ANGIOGRAPHY N/A 10/24/2023   Procedure: RIGHT HEART CATH AND CORONARY ANGIOGRAPHY;  Surgeon: Ladona Heinz, MD;  Location: [facility] INVASIVE CV LAB;  Service: Cardiovascular;  Laterality: N/A;   SHOULDER SURGERY     RIGHT SHOULDER SURGERY   THYROIDECTOMY     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   RIGHT   TOTAL KNEE ARTHROPLASTY Left 09/10/2018   Procedure: TOTAL KNEE ARTHROPLASTY;  Surgeon: Sheril Coy, MD;  Location: [facility] ORS;  Service: Orthopedics;   Laterality: Left;    SOCIAL HISTORY: Social History   Socioeconomic History   Marital status: Married    Spouse name: Not on file   Number of children: 4   Years of education: Not on file   Highest education level: 12th grade  Occupational History   Not on file  Tobacco Use   Smoking status: Never   Smokeless tobacco: Never  Vaping Use   Vaping status: Never Used  Substance and Sexual Activity   Alcohol use: No    Alcohol/week: 0.0 standard drinks of alcohol   Drug use: No   Sexual activity: Yes    Partners: Male    Birth control/protection: Surgical    Comment: 1st intercourse- 52, partners- 1, married- 57 yrs   Other Topics Concern   Not on file  Social History Narrative   Lives with husband, Helayne FISHER- daughter, documents at home  Married 1960   Social Drivers of Corporate investment banker Strain: Low Risk  (12/10/2023)   Overall Financial Resource Strain (CARDIA)    Difficulty of Paying Living Expenses: Not hard at all  Food Insecurity: No Food Insecurity (04/24/2024)   Hunger Vital Sign    Worried About Running Out of Food in the Last Year: Never true    Ran Out of Food in the Last Year: Never true  Transportation Needs: No Transportation Needs (04/24/2024)   PRAPARE - Administrator, Civil Service (Medical): No    Lack of Transportation (Non-Medical): No  Physical Activity: Inactive (12/10/2023)   Exercise Vital Sign    Days of Exercise per Week: 0 days    Minutes of Exercise per Session: 10 min  Stress: Stress Concern Present (12/10/2023)   [employer] of Occupational Health - Occupational Stress Questionnaire    Feeling of Stress : Very much  Social Connections: Unknown (01/01/2024)   Social Connection and Isolation Panel    Frequency of Communication with Friends and Family: More than three times a week    Frequency of Social Gatherings with Friends and Family: More than three times a week    Attends Religious Services: Not on Environmental health practitioner or Organizations: Not on file    Attends Banker Meetings: Not on file    Marital Status: Not on file  Recent Concern: Social Connections - Moderately Isolated (12/11/2023)   Social Connection and Isolation Panel    Frequency of Communication with Friends and Family: More than three times a week    Frequency of Social Gatherings with Friends and Family: More than three times a week    Attends Religious Services: Never    Database administrator or Organizations: No    Attends Banker Meetings: Never    Marital Status: Married  Catering manager Violence: Not At Risk (01/01/2024)   Humiliation, Afraid, Rape, and Kick questionnaire    Fear of Current or Ex-Partner: No    Emotionally Abused: No    Physically Abused: No    Sexually Abused: No    FAMILY HISTORY: Family History  Problem Relation Age of Onset   Osteoporosis Mother    Heart disease Mother    Macular degeneration Mother    Heart disease Father    Cancer Father        BRAIN TUMOR   Cancer Brother        BRAIN TUMOR   Diabetes Maternal Aunt    Diabetes Maternal Uncle    Stroke Paternal Grandfather    Colon cancer Neg Hx    Breast cancer Neg Hx    Migraines Neg Hx     ALLERGIES:  is allergic to metformin  and related, codeine, and dilaudid [hydromorphone hcl].  MEDICATIONS:  Current Outpatient Medications  Medication Sig Dispense Refill   amiodarone  (PACERONE ) 200 MG tablet Take 0.5 tablets (100 mg total) by mouth daily.     Ascorbic Acid (VITAMIN C PO) Take 500 mg by mouth in the morning.     atorvastatin  (LIPITOR) 40 MG tablet Take 1 tablet (40 mg total) by mouth daily at 6PM. 90 tablet 3   Cholecalciferol 25 MCG (1000 UT) capsule Take 1,000 Units by mouth daily.     cyanocobalamin  (VITAMIN B12) 1000 MCG tablet Take 1 tablet (1,000 mcg total) by mouth 3 (three) times a week. Take on Mon-Wed-Fr.     Docosanol (ABREVA) 10 %  CREA Apply 1 application  topically as  needed (fever blister).     ferrous sulfate  (FEROSUL) 325 (65 FE) MG tablet Take 1 tablet (325 mg total) by mouth 2 (two) times daily with a meal. 60 tablet 3   glucose blood (ONETOUCH VERIO) test strip use to test blood glucose levels in the morning, at noon, and at bedtime. 300 each 3   levothyroxine  (SYNTHROID ) 88 MCG tablet Take 1 tablet (88 mcg total) by mouth daily. 90 tablet 3   losartan -hydrochlorothiazide  (HYZAAR ) 50-12.5 MG tablet Take 1 tablet by mouth every morning. 90 tablet 2   Multiple Vitamins-Minerals (PRESERVISION AREDS 2 PO) Take 1 tablet by mouth 2 (two) times daily.      OneTouch Delica Lancets 33G MISC use one in the morning, at noon, and at bedtime to check blood glucose levels 300 each 3   pantoprazole  (PROTONIX ) 40 MG tablet Take 1 tablet (40 mg total) by mouth daily. 30 tablet 1   pyridOXINE (B-6) 50 MG tablet Take 50 mg by mouth daily.     valACYclovir  (VALTREX ) 500 MG tablet Take 1 tablet (500 mg total) by mouth daily as needed (fever blister). 30 tablet 2   amoxicillin  (AMOXIL ) 500 MG capsule Take 4 capsules (2,000 mg total) by mouth 1 hour prior to dental work including cleanings as directed. (Patient not taking: Reported on 04/25/2024) 12 capsule 6   apixaban  (ELIQUIS ) 5 MG TABS tablet Take 1 tablet (5 mg total) by mouth 2 (two) times daily. (Patient not taking: Reported on 05/01/2024) 180 tablet 1   rOPINIRole  (REQUIP ) 0.25 MG tablet Take 1 tablet (0.25 mg total) by mouth at bedtime. (Patient not taking: Reported on 05/01/2024) 30 tablet 1   No current facility-administered medications for this visit.    Review of Systems  Constitutional:  Positive for fatigue. Negative for appetite change, chills and fever.  HENT:   Negative for hearing loss and voice change.   Eyes:  Negative for eye problems.  Respiratory:  Negative for chest tightness and cough.   Cardiovascular:  Negative for chest pain.  Gastrointestinal:  Negative for abdominal distention, abdominal pain  and blood in stool.  Endocrine: Negative for hot flashes.  Genitourinary:  Negative for difficulty urinating and frequency.   Musculoskeletal:  Negative for arthralgias.  Skin:  Negative for itching and rash.  Neurological:  Negative for extremity weakness.  Hematological:  Negative for adenopathy.  Psychiatric/Behavioral:  Negative for confusion.    PHYSICAL EXAMINATION:  Vitals:   05/01/24 1011  BP: (!) 119/53  Pulse: 65  Resp: 19  Temp: 98.2 F (36.8 C)  SpO2: 99%   Filed Weights   05/01/24 1011  Weight: 172 lb (78 kg)    Physical Exam Constitutional:      General: She is not in acute distress. HENT:     Head: Normocephalic and atraumatic.  Eyes:     General: No scleral icterus. Cardiovascular:     Rate and Rhythm: Normal rate.  Pulmonary:     Effort: Pulmonary effort is normal. No respiratory distress.  Abdominal:     General: There is no distension.  Musculoskeletal:        General: Normal range of motion.     Cervical back: Normal range of motion and neck supple.  Skin:    Findings: No erythema or rash.  Neurological:     Mental Status: She is alert and oriented to person, place, and time. Mental status is at baseline.  Psychiatric:  Mood and Affect: Mood normal.     LABORATORY DATA:  I have reviewed the data as listed    Latest Ref Rng & Units 04/25/2024   11:48 AM 04/09/2024   11:12 AM 03/07/2024   10:30 AM  CBC  WBC 4.0 - 10.5 K/uL 7.5  10.3  7.9   Hemoglobin 12.0 - 15.0 g/dL 8.7  9.2  88.8   Hematocrit 36.0 - 46.0 % 25.9  27.1  34.4   Platelets 150 - 400 K/uL 363  375.0  334       Latest Ref Rng & Units 04/25/2024   11:48 AM 02/08/2024    7:54 AM 01/02/2024    3:17 AM  CMP  Glucose 70 - 99 mg/dL 838  851  871   BUN 8 - 23 mg/dL 14  16  13    Creatinine 0.44 - 1.00 mg/dL 8.65  8.88  9.00   Sodium 135 - 145 mmol/L 136  141  138   Potassium 3.5 - 5.1 mmol/L 3.8  4.2  3.9   Chloride 98 - 111 mmol/L 101  103  103   CO2 22 - 32 mmol/L 25   23  24    Calcium  8.9 - 10.3 mg/dL 8.5  8.5  8.5   Total Protein 6.5 - 8.1 g/dL 6.2     Total Bilirubin 0.0 - 1.2 mg/dL 1.0     Alkaline Phos 38 - 126 U/L 73     AST 15 - 41 U/L 39     ALT 0 - 44 U/L 34         RADIOGRAPHIC STUDIES: I have personally reviewed the radiological images as listed and agreed with the findings in the report. Intravitreal Injection, Pharmacologic Agent - OD - Right Eye Result Date: 03/28/2024 Time Out 03/28/2024. 8:50 AM. Confirmed correct patient, procedure, site, and patient consented. Anesthesia Topical anesthesia was used. Anesthetic medications included Lidocaine  2%, Proparacaine 0.5%. Procedure Preparation included 5% betadine to ocular surface, eyelid speculum. A supplied (32g) needle was used. Injection: 6 mg faricimab -svoa 6 MG/0.05ML (Patient supplied)   Route: Intravitreal, Site: Right Eye   NDC: 49757-903-93, Lot: [id number], Expiration date: 04/08/2025, Waste: 0 mL Post-op Post injection exam found visual acuity of at least counting fingers. The patient tolerated the procedure well. There were no complications. The patient received written and verbal post procedure care education. Post injection medications were not given. Notes **PAP MEDICATION ADMINISTERED**   Intravitreal Injection, Pharmacologic Agent - OS - Left Eye Result Date: 03/28/2024 Time Out 03/28/2024. 8:51 AM. Confirmed correct patient, procedure, site, and patient consented. Anesthesia Topical anesthesia was used. Anesthetic medications included Lidocaine  2%, Proparacaine 0.5%. Procedure Preparation included 5% betadine to ocular surface, eyelid speculum. A supplied (33g) needle was used. Injection: 6 mg faricimab -svoa 6 MG/0.05ML (Patient supplied)   Route: Intravitreal, Site: Left Eye   NDC: 49757-903-93, Lot: [id number], Expiration date: 04/08/2025, Waste: 0 mL Post-op Post injection exam found visual acuity of at least counting fingers. The patient tolerated the procedure well. There were no  complications. The patient received written and verbal post procedure care education. Notes **PAP MEDICATION ADMINISTERED**   OCT, Retina - OU - Both Eyes Result Date: 03/28/2024 Right Eye Quality was good. Central Foveal Thickness: 358. Progression has improved. Findings include no SRF, abnormal foveal contour, retinal drusen , subretinal hyper-reflective material, intraretinal fluid, pigment epithelial detachment, outer retinal atrophy (persistent PED, persistent IRF / cystic changes-- slightly improved, stable improvement in SRF overlying PED, +ORA). Left Eye Quality  was good. Central Foveal Thickness: 221. Progression has been stable. Findings include normal foveal contour, no IRF, no SRF, retinal drusen , outer retinal atrophy (Stable improvement in focal central SRHM ). 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 CME - cystoid macular edema. PED - pigment epithelial detachment. IRF - intraretinal fluid. SRF - subretinal fluid. EZ - ellipsoid zone. ERM - epiretinal membrane. ORA - outer retinal atrophy. ORT - outer retinal tubulation. SRHM - subretinal hyper-reflective material. IRHM - intraretinal hyper-reflective material   ECHOCARDIOGRAM COMPLETE Result Date: 02/15/2024    ECHOCARDIOGRAM REPORT   Patient Name:   JENEANE PIECZYNSKI [place] Date of Exam: 02/14/2024 Medical Rec #:  [id number]        Height:       62.0 in Accession #:    [id number]       Weight:       165.0 lb Date of Birth:  Nov 16, 1940       BSA:          1.762 m Patient Age:    82 years         BP:           157/64 mmHg Patient Gender: F                HR:           73 bpm. Exam Location:  [address] Procedure: 2D Echo, Cardiac Doppler and Color Doppler (Both Spectral and Color             Flow Doppler were utilized during procedure). Indications:    Z95.2 S/p TAVR  History:        Patient has prior history of Echocardiogram examinations, most                 recent 01/02/2024. S/p TAVR (23mm Celestia MOLDER),                 Arrythmias:Paroxysmal atrial fibrillation and LBBB,                 Signs/Symptoms:MR; Risk Factors:Hypertension, Dyslipidemia and                 Diabetes.  Sonographer:    Elsie Celestia RDCS Referring Phys: [id number] KATHRYN R THOMPSON IMPRESSIONS  1. Left ventricular ejection fraction, by estimation, is 65 to 70%. The left ventricle has normal function. The left ventricle has no regional wall motion abnormalities. There is moderate concentric left ventricular hypertrophy. Left ventricular diastolic parameters are indeterminate.  2. Right ventricular systolic function is normal. The right ventricular size is normal. There is moderately elevated pulmonary artery systolic pressure. The estimated right ventricular systolic pressure is 52.3 mmHg.  3. Left atrial size was moderately dilated.  4. The mitral valve is abnormal. Severe mitral valve regurgitation. No evidence of mitral stenosis. Severe mitral annular calcification.  5. Tricuspid valve regurgitation is mild to moderate.  6. The aortic valve has been repaired/replaced. Aortic valve regurgitation is not visualized. No aortic stenosis is present. Echo findings are consistent with normal structure and function of the aortic valve prosthesis. Aortic valve area, by VTI measures 1.37 cm. Aortic valve mean gradient measures 9.0 mmHg. Aortic valve Vmax measures 2.22 m/s.  7. The inferior vena cava is normal  in size with greater than 50% respiratory variability, suggesting right atrial pressure of 3 mmHg. FINDINGS  Left Ventricle: Left ventricular ejection fraction, by estimation, is 65 to 70%. The left ventricle has normal function. The left ventricle has no regional wall motion abnormalities. The left ventricular internal  cavity size was normal in size. There is  moderate concentric left ventricular hypertrophy. Left ventricular diastolic parameters are indeterminate. Right Ventricle: The right ventricular size is normal. No increase in right ventricular wall thickness. Right ventricular systolic function is normal. There is moderately elevated pulmonary artery systolic pressure. The tricuspid regurgitant velocity is 3.51 m/s, and with an assumed right atrial pressure of 3 mmHg, the estimated right ventricular systolic pressure is 52.3 mmHg. Left Atrium: Left atrial size was moderately dilated. Right Atrium: Right atrial size was normal in size. Pericardium: There is no evidence of pericardial effusion. Mitral Valve: The mitral valve is abnormal. There is moderate thickening of the mitral valve leaflet(s). Severe mitral annular calcification. Severe mitral valve regurgitation, with anteriorly-directed jet. No evidence of mitral valve stenosis. Tricuspid Valve: The tricuspid valve is normal in structure. Tricuspid valve regurgitation is mild to moderate. No evidence of tricuspid stenosis. Aortic Valve: The aortic valve has been repaired/replaced. Aortic valve regurgitation is not visualized. No aortic stenosis is present. Aortic valve mean gradient measures 9.0 mmHg. Aortic valve peak gradient measures 19.7 mmHg. Aortic valve area, by VTI  measures 1.37 cm. There is a 23 mm Sapien prosthetic, stented (TAVR) valve present in the aortic position. Echo findings are consistent with normal structure and function of the aortic valve prosthesis. Pulmonic Valve: The pulmonic valve was normal in structure. Pulmonic valve regurgitation is trivial. No evidence of pulmonic stenosis. Aorta: The aortic root is normal in size and structure. Venous: The inferior vena cava is normal in size with greater than 50% respiratory variability, suggesting right atrial pressure of 3 mmHg. IAS/Shunts: No atrial level shunt detected by color flow Doppler.  LEFT  VENTRICLE PLAX 2D LVIDd:         3.90 cm   Diastology LVIDs:         2.80 cm   LV e' medial:    5.44 cm/s LV PW:         1.20 cm   LV E/e' medial:  29.0 LV IVS:        1.50 cm   LV e' lateral:   7.97 cm/s LVOT diam:     1.85 cm   LV E/e' lateral: 19.8 LV SV:         63 LV SV Index:   36 LVOT Area:     2.69 cm  RIGHT VENTRICLE             IVC RV S prime:     14.00 cm/s  IVC diam: 1.00 cm TAPSE (M-mode): 2.3 cm RVSP:           52.3 mmHg LEFT ATRIUM             Index        RIGHT ATRIUM           Index LA diam:        3.60 cm 2.04 cm/m   RA Pressure: 3.00 mmHg LA Vol (A2C):   47.9 ml 27.19 ml/m  RA Area:     12.20 cm LA Vol (A4C):   69.6 ml 39.51 ml/m  RA Volume:   29.40 ml  16.69 ml/m LA Biplane Vol: 62.1 ml 35.25 ml/m  AORTIC VALVE AV Area (Vmax):    1.27 cm AV Area (Vmean):   1.36 cm AV Area (VTI):     1.37 cm AV Vmax:           221.67 cm/s AV Vmean:          135.333 cm/s AV VTI:            0.458 m AV Peak Grad:      19.7 mmHg AV Mean Grad:      9.0 mmHg LVOT Vmax:         105.00 cm/s LVOT Vmean:        68.300 cm/s LVOT VTI:          0.233 m LVOT/AV VTI ratio: 0.51  AORTA Ao Root diam: 2.80 cm Ao Asc diam:  3.20 cm MITRAL VALVE                TRICUSPID VALVE MV Area (PHT): 2.61 cm     TR Peak grad:   49.3 mmHg MV Decel Time: 291 msec     TR Vmax:        351.00 cm/s MV E velocity: 158.00 cm/s  Estimated RAP:  3.00 mmHg MV A velocity: 112.00 cm/s  RVSP:           52.3 mmHg MV E/A ratio:  1.41                             SHUNTS                             Systemic VTI:  0.23 m                             Systemic Diam: 1.85 cm Toribio Fuel MD Electronically signed by Toribio Fuel MD Signature Date/Time: 02/15/2024/1:09:50 AM    Final    Intravitreal Injection, Pharmacologic Agent - OD - Right Eye Result Date: 02/13/2024 Time Out 02/08/2024. 9:15 AM. Confirmed correct patient, procedure, site, and patient consented. Anesthesia Topical anesthesia was used. Anesthetic medications included Lidocaine  2%,  Proparacaine 0.5%. Procedure Preparation included 5% betadine to ocular surface, eyelid speculum. A supplied (32g) needle was used. Injection: 6 mg faricimab -svoa 6 MG/0.05ML (Patient supplied)   Route: Intravitreal, Site: Right Eye   NDC: 49757-903-98, Lot: [id number], Expiration date: 04/08/2026, Waste: 0 mL Post-op Post injection exam found visual acuity of at least counting fingers. The patient tolerated the procedure well. There were no complications. The patient received written and verbal post procedure care education. Post injection medications were not given. Notes **SAMPLE MEDICATION ADMINISTERED**   Intravitreal Injection, Pharmacologic Agent - OS - Left Eye Result Date: 02/09/2024 Time Out 02/08/2024. 9:15 AM. Confirmed correct patient, procedure, site, and patient consented. Anesthesia Topical anesthesia was used. Anesthetic medications included Lidocaine  2%, Proparacaine 0.5%. Procedure Preparation included 5% betadine to ocular surface, eyelid speculum. A supplied (33g) needle was used. Injection: 1.25 mg Bevacizumab  1.25mg /0.44ml   Route: Intravitreal, Site: Left Eye   NDC: 49757-939-98, Lot: [id number]$$[id number]$ , Expiration date: 03/02/2024 Post-op Post injection exam found visual acuity of at least counting fingers. The patient tolerated the procedure well. There were no complications. The patient received written and verbal post procedure care education.   OCT, Retina - OU - Both Eyes Result Date: 02/09/2024 Right Eye Quality was good. Central Foveal Thickness: 348. Progression has been  stable. Findings include no SRF, abnormal foveal contour, retinal drusen , subretinal hyper-reflective material, intraretinal fluid, pigment epithelial detachment, outer retinal atrophy (persistent PED, persistent IRF / cystic changes, stable improvement in SRF overlying PED, +ORA). Left Eye Quality was good. Central Foveal Thickness: 231. Progression has been stable. Findings include normal foveal contour, no IRF, no SRF,  retinal drusen , outer retinal atrophy (Stable improvement in focal central SRHM ). Notes *Images captured and stored on drive Diagnosis / Impression: Ex ARMD OU OD: persistent PED, persistent IRF / cystic changes, stable improvement in SRF overlying PED, +ORA OS: Stable improvement in focal central [facility] Clinical management: See below Abbreviations: NFP - Normal foveal profile. CME - cystoid macular edema. PED - pigment epithelial detachment. IRF - intraretinal fluid. SRF - subretinal fluid. EZ - ellipsoid zone. ERM - epiretinal membrane. ORA - outer retinal atrophy. ORT - outer retinal tubulation. SRHM - subretinal hyper-reflective material. IRHM - intraretinal hyper-reflective material

## 2024-05-02 ENCOUNTER — Other Ambulatory Visit (HOSPITAL_COMMUNITY): Payer: Self-pay

## 2024-05-02 ENCOUNTER — Other Ambulatory Visit: Payer: Self-pay | Admitting: Family Medicine

## 2024-05-02 ENCOUNTER — Other Ambulatory Visit: Payer: Self-pay

## 2024-05-02 MED ORDER — PANTOPRAZOLE SODIUM 40 MG PO TBEC
40.0000 mg | DELAYED_RELEASE_TABLET | Freq: Every day | ORAL | 1 refills | Status: DC
  Filled 2024-05-02: qty 30, 30d supply, fill #0
  Filled 2024-05-23: qty 30, 30d supply, fill #1

## 2024-05-02 NOTE — Telephone Encounter (Signed)
 Called and made patient aware per Dr. Ladona not to start Eliquis  and to keep the appointment with Watchman device otherwise there will be delay in care. Pt verbalized an understanding.

## 2024-05-04 NOTE — H&P (View-Only) (Signed)
 Cardiology Office Note   Date:  05/06/2024  ID:  Dana Sandoval, DOB [birth year], MRN [id number] PCP: [name], MD  [facility] Providers Cardiologist: Gordy Bergamo, MD  Electrophysiologist:  Donnice DELENA Primus, MD   History of Present Illness Dana Sandoval is a 83 y.o. 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  She was hospitalized in June 2025 with fatigue, weakness, exertional dyspnea with associated black tarry stools and was found to have profound anemia to 4.8 requiring admission and transfusion.  She required 2 units PRBCs with EGD and colonoscopy unable to identify a source.  She had capsule endoscopy without any obvious abnormalities. History of Present Illness Dana Sandoval is an 83 year old female with atrial fibrillation who presents for evaluation of bleeding issues and consideration of a Watchman device. She was referred by Dr. Bergamo for evaluation of her atrial fibrillation and bleeding issues.  She has a history of atrial fibrillation and has been experiencing bleeding issues, which led to hospitalization and the need for blood transfusions. Despite an extensive workup, the source of bleeding remains unidentified. She was previously on apixaban  but was taken off it approximately three and a half weeks ago due to these bleeding complications.  She does not feel symptoms when she is in atrial fibrillation.  She has undergone multiple diagnostic studies, including CT scans and a capsule endoscopy, which did not reveal the source of bleeding. She is currently receiving iron infusions to address her low hemoglobin levels, with one infusion completed and more scheduled.  Her stools have been lighter in color over the past four to  five days, which she notes is a change from when she was on blood thinners. She is concerned about the possibility of a slow bleed that has not been identified.  ROS: recurrent GIB  Studies Reviewed  ECG review 01/17/24: NSR 63, PR 220, QRS 90, QT/c 460/470, 1' AVB  12/10/23: sinus arrhythmia 90, QRS 92, QT/c 448/548 10/18/23: AF/RVR 125, QRS 74, QT/c 334/482  TTE  Result date: 02/14/24  1. Left ventricular ejection fraction, by estimation, is 65 to 70%. The  left ventricle has normal function. The left ventricle has no regional  wall motion abnormalities. There is moderate concentric left ventricular  hypertrophy. Left ventricular  diastolic parameters are indeterminate.   2. Right ventricular systolic function is normal. The right ventricular  size is normal. There is moderately elevated pulmonary artery systolic  pressure. The estimated right ventricular systolic pressure is 52.3 mmHg.   3. Left atrial size was moderately dilated.   4. The mitral valve is abnormal. Severe mitral valve regurgitation. No  evidence of mitral stenosis. Severe mitral annular calcification.   5. Tricuspid valve regurgitation is mild to moderate.   6. The aortic valve has been repaired/replaced. Aortic valve  regurgitation is not visualized. No aortic stenosis is present. Echo  findings are consistent with normal structure and function of the aortic  valve prosthesis. Aortic valve area, by VTI  measures 1.37 cm. Aortic valve mean gradient measures 9.0 mmHg. Aortic  valve Vmax measures 2.22 m/s.   7. The inferior vena cava is normal in size with greater than 50%  respiratory variability, suggesting right atrial pressure of 3 mmHg.   Risk Assessment/Calculations  CHA2DS2-VASc Score = 5  This indicates a 7.2% annual risk of stroke. The patient's score is based upon: CHF History: 1 HTN History: 1 Diabetes History: 0 Stroke History: 0 Vascular Disease History: 0 Age Score: 2 Gender Score:  1  Physical Exam VS:  BP 136/70   Pulse 68   Ht 5' 2 (1.575 m)   Wt 174 lb 9.6 oz (79.2 kg)   SpO2 97%   BMI 31.93 kg/m       Wt Readings from Last 3 Encounters:  05/06/24 174 lb 9.6 oz (79.2 kg)  05/01/24 172 lb (78 kg)  04/25/24 170 lb (77.1 kg)    GEN: Well nourished, well developed in no acute distress NECK: No JVD; No carotid bruits CARDIAC: RRR, 3/6 systolic murmur most prominent at apex RESPIRATORY:  Clear to auscultation without rales, wheezing or rhonchi  EXTREMITIES:  No edema; No deformity   ASSESSMENT AND PLAN Dana Sandoval is a 83 y.o. 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    Paroxysmal AF GIB 2/2 gastric ulcers I reviewed different options for management of stroke risk related to her atrial fibrillation.  She required 4 units PRBCs in June 2025 with no etiology for anemia identified on EGD/colonoscopy/capsule endoscopy.  Her hemoglobin remained stable following discontinuation of OAC.  We reviewed different options for management including excepting a 7 %/year stroke risk and continuing off of anticoagulation versus Watchman implant.  I do not think that she will tolerate long-term anticoagulation use with her most recent hospitalization no etiology for bleed.  I have seen Dana Sandoval in the office today who is being considered for a Watchman left atrial appendage closure device. I believe they will benefit from this procedure given their history of atrial fibrillation, CHA2DS2-VASc score of 5 and unadjusted ischemic stroke rate of 7.2% per year. Unfortunately, the patient is not felt to be a long term anticoagulation candidate secondary to GIB and anemia. The patient's chart has been reviewed and I feel that they would be a  candidate for short term oral anticoagulation after Watchman implant.   It is my belief that after undergoing a LAA closure procedure, Dana Sandoval will not need long term anticoagulation which eliminates anticoagulation side effects and major bleeding risk.   Procedural risks for the Watchman implant have been reviewed with the patient including a 0.5% risk of stroke, <1% risk of perforation and <1% risk of device embolization. Other risks include bleeding, vascular damage, tamponade, worsening renal function, and death. The patient understands these risk and wishes to proceed.    The published clinical data on the safety and effectiveness of WATCHMAN include but are not limited to the following: - Holmes DR, Jess BEARD, Sick P et al. for the PROTECT AF Investigators. Percutaneous closure of the left atrial appendage versus warfarin therapy for prevention of stroke in patients with atrial fibrillation: a randomised non-inferiority trial. Lancet 2009; 374: 534-42. GLENWOOD Jess BEARD, Doshi SK, Jonita VEAR Satchel D et al. on behalf of the PROTECT AF Investigators. Percutaneous Left Atrial Appendage Closure for Stroke Prophylaxis in Patients With Atrial Fibrillation 2.3-Year Follow-up of the PROTECT AF (Watchman Left Atrial Appendage System for Embolic Protection in Patients With Atrial Fibrillation) Trial. Circulation 2013; 127:720-729. - Alli O, Doshi S,  Kar S, Reddy VY, Sievert H et al.  Quality of Life Assessment in the Randomized PROTECT AF (Percutaneous Closure of the Left Atrial Appendage Versus Warfarin Therapy for Prevention of Stroke in Patients With Atrial Fibrillation) Trial of Patients at Risk for Stroke With Nonvalvular Atrial Fibrillation. J Am Coll Cardiol 2013; 61:1790-8. GLENWOOD Satchel DR, Archer RAMAN, Price M, Whisenant B, Sievert H, Doshi S, Huber K, Reddy V. Prospective randomized evaluation of the Watchman left atrial appendage Device in patients with atrial fibrillation versus long-term warfarin therapy;  the PREVAIL trial. Journal of the [employer] of Cardiology, Vol. 4, No. 1, 2014, 1-11. - Kar S, Doshi SK, Sadhu A, Horton R, Osorio J et al. Primary outcome evaluation of a next-generation left atrial appendage closure device: results from the PINNACLE FLX trial. Circulation 2021;143(18)1754-1762.   After today's visit with the patient which was dedicated solely for shared decision making visit regarding LAA closure device, the patient decided to proceed with the LAA appendage closure procedure. CT scan for TAVR already has been completed and used for Watchman planning.   Will plan for DAPT x 6 months following implant followed by ASA monotherapy thereafter.   HAS-BLED score 3 Hypertension Yes  Abnormal renal and liver function (Dialysis, transplant, Cr >2.26 mg/dL /Cirrhosis or Bilirubin >2x Normal or AST/ALT/AP >3x Normal) No  Stroke No  Bleeding Yes  Labile INR (Unstable/high INR) No  Elderly (>65) Yes  Drugs or alcohol (>= 8 drinks/week, anti-plt or NSAID) No   CHA2DS2-VASc Score = 5  The patient's score is based upon: CHF History: 1 HTN History: 1 Diabetes History: 0 Stroke History: 0 Vascular Disease History: 0 Age Score: 2 Gender Score: 1  Procedural details:  NPO night prior  Start DAPT (ASA 81 mg daily, plavix  75 mg daily) post implant, continue x 6 months then continue ASA monotherapy after Schedule for 05/15/24  Dispo: RTC post Watchman implant  A total of 70 minutes was spent preparing for the patient, reviewing history, performing exam, document encounter, coordinating care and counseling the patient. 45 minutes was spent with direct patient care.   Signed,  Donnice DELENA Primus, MD    05/06/2024 1:00 PM

## 2024-05-04 NOTE — Progress Notes (Signed)
 Cardiology Office Note   Date:  05/06/2024  ID:  DUNIA PRINGLE, DOB [birth year], MRN [id number] PCP: [name], MD  [facility] Providers Cardiologist: Gordy Bergamo, MD  Electrophysiologist:  Donnice DELENA Primus, MD   History of Present Illness Dana Sandoval is a 83 y.o. 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  She was hospitalized in June 2025 with fatigue, weakness, exertional dyspnea with associated black tarry stools and was found to have profound anemia to 4.8 requiring admission and transfusion.  She required 2 units PRBCs with EGD and colonoscopy unable to identify a source.  She had capsule endoscopy without any obvious abnormalities. History of Present Illness Dana Sandoval is an 83 year old female with atrial fibrillation who presents for evaluation of bleeding issues and consideration of a Watchman device. She was referred by Dr. Bergamo for evaluation of her atrial fibrillation and bleeding issues.  She has a history of atrial fibrillation and has been experiencing bleeding issues, which led to hospitalization and the need for blood transfusions. Despite an extensive workup, the source of bleeding remains unidentified. She was previously on apixaban  but was taken off it approximately three and a half weeks ago due to these bleeding complications.  She does not feel symptoms when she is in atrial fibrillation.  She has undergone multiple diagnostic studies, including CT scans and a capsule endoscopy, which did not reveal the source of bleeding. She is currently receiving iron infusions to address her low hemoglobin levels, with one infusion completed and more scheduled.  Her stools have been lighter in color over the past four to  five days, which she notes is a change from when she was on blood thinners. She is concerned about the possibility of a slow bleed that has not been identified.  ROS: recurrent GIB  Studies Reviewed  ECG review 01/17/24: NSR 63, PR 220, QRS 90, QT/c 460/470, 1' AVB  12/10/23: sinus arrhythmia 90, QRS 92, QT/c 448/548 10/18/23: AF/RVR 125, QRS 74, QT/c 334/482  TTE  Result date: 02/14/24  1. Left ventricular ejection fraction, by estimation, is 65 to 70%. The  left ventricle has normal function. The left ventricle has no regional  wall motion abnormalities. There is moderate concentric left ventricular  hypertrophy. Left ventricular  diastolic parameters are indeterminate.   2. Right ventricular systolic function is normal. The right ventricular  size is normal. There is moderately elevated pulmonary artery systolic  pressure. The estimated right ventricular systolic pressure is 52.3 mmHg.   3. Left atrial size was moderately dilated.   4. The mitral valve is abnormal. Severe mitral valve regurgitation. No  evidence of mitral stenosis. Severe mitral annular calcification.   5. Tricuspid valve regurgitation is mild to moderate.   6. The aortic valve has been repaired/replaced. Aortic valve  regurgitation is not visualized. No aortic stenosis is present. Echo  findings are consistent with normal structure and function of the aortic  valve prosthesis. Aortic valve area, by VTI  measures 1.37 cm. Aortic valve mean gradient measures 9.0 mmHg. Aortic  valve Vmax measures 2.22 m/s.   7. The inferior vena cava is normal in size with greater than 50%  respiratory variability, suggesting right atrial pressure of 3 mmHg.   Risk Assessment/Calculations  CHA2DS2-VASc Score = 5  This indicates a 7.2% annual risk of stroke. The patient's score is based upon: CHF History: 1 HTN History: 1 Diabetes History: 0 Stroke History: 0 Vascular Disease History: 0 Age Score: 2 Gender Score:  1  Physical Exam VS:  BP 136/70   Pulse 68   Ht 5' 2 (1.575 m)   Wt 174 lb 9.6 oz (79.2 kg)   SpO2 97%   BMI 31.93 kg/m       Wt Readings from Last 3 Encounters:  05/06/24 174 lb 9.6 oz (79.2 kg)  05/01/24 172 lb (78 kg)  04/25/24 170 lb (77.1 kg)    GEN: Well nourished, well developed in no acute distress NECK: No JVD; No carotid bruits CARDIAC: RRR, 3/6 systolic murmur most prominent at apex RESPIRATORY:  Clear to auscultation without rales, wheezing or rhonchi  EXTREMITIES:  No edema; No deformity   ASSESSMENT AND PLAN Dana Sandoval is a 83 y.o. 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    Paroxysmal AF GIB 2/2 gastric ulcers I reviewed different options for management of stroke risk related to her atrial fibrillation.  She required 4 units PRBCs in June 2025 with no etiology for anemia identified on EGD/colonoscopy/capsule endoscopy.  Her hemoglobin remained stable following discontinuation of OAC.  We reviewed different options for management including excepting a 7 %/year stroke risk and continuing off of anticoagulation versus Watchman implant.  I do not think that she will tolerate long-term anticoagulation use with her most recent hospitalization no etiology for bleed.  I have seen Dana Sandoval in the office today who is being considered for a Watchman left atrial appendage closure device. I believe they will benefit from this procedure given their history of atrial fibrillation, CHA2DS2-VASc score of 5 and unadjusted ischemic stroke rate of 7.2% per year. Unfortunately, the patient is not felt to be a long term anticoagulation candidate secondary to GIB and anemia. The patient's chart has been reviewed and I feel that they would be a  candidate for short term oral anticoagulation after Watchman implant.   It is my belief that after undergoing a LAA closure procedure, Dana Sandoval will not need long term anticoagulation which eliminates anticoagulation side effects and major bleeding risk.   Procedural risks for the Watchman implant have been reviewed with the patient including a 0.5% risk of stroke, <1% risk of perforation and <1% risk of device embolization. Other risks include bleeding, vascular damage, tamponade, worsening renal function, and death. The patient understands these risk and wishes to proceed.    The published clinical data on the safety and effectiveness of WATCHMAN include but are not limited to the following: - Holmes DR, Jess BEARD, Sick P et al. for the PROTECT AF Investigators. Percutaneous closure of the left atrial appendage versus warfarin therapy for prevention of stroke in patients with atrial fibrillation: a randomised non-inferiority trial. Lancet 2009; 374: 534-42. GLENWOOD Jess BEARD, Doshi SK, Jonita VEAR Satchel D et al. on behalf of the PROTECT AF Investigators. Percutaneous Left Atrial Appendage Closure for Stroke Prophylaxis in Patients With Atrial Fibrillation 2.3-Year Follow-up of the PROTECT AF (Watchman Left Atrial Appendage System for Embolic Protection in Patients With Atrial Fibrillation) Trial. Circulation 2013; 127:720-729. - Alli O, Doshi S,  Kar S, Reddy VY, Sievert H et al.  Quality of Life Assessment in the Randomized PROTECT AF (Percutaneous Closure of the Left Atrial Appendage Versus Warfarin Therapy for Prevention of Stroke in Patients With Atrial Fibrillation) Trial of Patients at Risk for Stroke With Nonvalvular Atrial Fibrillation. J Am Coll Cardiol 2013; 61:1790-8. GLENWOOD Satchel DR, Archer RAMAN, Price M, Whisenant B, Sievert H, Doshi S, Huber K, Reddy V. Prospective randomized evaluation of the Watchman left atrial appendage Device in patients with atrial fibrillation versus long-term warfarin therapy;  the PREVAIL trial. Journal of the [employer] of Cardiology, Vol. 4, No. 1, 2014, 1-11. - Kar S, Doshi SK, Sadhu A, Horton R, Osorio J et al. Primary outcome evaluation of a next-generation left atrial appendage closure device: results from the PINNACLE FLX trial. Circulation 2021;143(18)1754-1762.   After today's visit with the patient which was dedicated solely for shared decision making visit regarding LAA closure device, the patient decided to proceed with the LAA appendage closure procedure. CT scan for TAVR already has been completed and used for Watchman planning.   Will plan for DAPT x 6 months following implant followed by ASA monotherapy thereafter.   HAS-BLED score 3 Hypertension Yes  Abnormal renal and liver function (Dialysis, transplant, Cr >2.26 mg/dL /Cirrhosis or Bilirubin >2x Normal or AST/ALT/AP >3x Normal) No  Stroke No  Bleeding Yes  Labile INR (Unstable/high INR) No  Elderly (>65) Yes  Drugs or alcohol (>= 8 drinks/week, anti-plt or NSAID) No   CHA2DS2-VASc Score = 5  The patient's score is based upon: CHF History: 1 HTN History: 1 Diabetes History: 0 Stroke History: 0 Vascular Disease History: 0 Age Score: 2 Gender Score: 1  Procedural details:  NPO night prior  Start DAPT (ASA 81 mg daily, plavix  75 mg daily) post implant, continue x 6 months then continue ASA monotherapy after Schedule for 05/15/24  Dispo: RTC post Watchman implant  A total of 70 minutes was spent preparing for the patient, reviewing history, performing exam, document encounter, coordinating care and counseling the patient. 45 minutes was spent with direct patient care.   Signed,  Donnice DELENA Primus, MD    05/06/2024 1:00 PM

## 2024-05-06 ENCOUNTER — Telehealth: Payer: Self-pay

## 2024-05-06 ENCOUNTER — Ambulatory Visit
Attending: Student in an Organized Health Care Education/Training Program | Admitting: Student in an Organized Health Care Education/Training Program

## 2024-05-06 ENCOUNTER — Other Ambulatory Visit: Payer: Self-pay

## 2024-05-06 ENCOUNTER — Encounter: Payer: Self-pay | Admitting: Student in an Organized Health Care Education/Training Program

## 2024-05-06 VITALS — BP 136/70 | HR 68 | Ht 62.0 in | Wt 174.6 lb

## 2024-05-06 DIAGNOSIS — K922 Gastrointestinal hemorrhage, unspecified: Secondary | ICD-10-CM

## 2024-05-06 DIAGNOSIS — I480 Paroxysmal atrial fibrillation: Secondary | ICD-10-CM

## 2024-05-06 DIAGNOSIS — D649 Anemia, unspecified: Secondary | ICD-10-CM

## 2024-05-06 DIAGNOSIS — Z01812 Encounter for preprocedural laboratory examination: Secondary | ICD-10-CM | POA: Diagnosis not present

## 2024-05-06 LAB — CBC

## 2024-05-06 NOTE — Progress Notes (Signed)
 Eliquis  removed from med list. Patient is no longer taking this medication. Dr. Almetta aware.

## 2024-05-06 NOTE — Telephone Encounter (Signed)
 SABRA

## 2024-05-06 NOTE — Patient Instructions (Signed)
 Medication Instructions:  Your physician recommends that you continue on your current medications as directed. Please refer to the Current Medication list given to you today.  *If you need a refill on your cardiac medications before your next appointment, please call your pharmacy*  Lab Work: CBC and BMET If you have labs (blood work) drawn today and your tests are completely normal, you will receive your results only by: MyChart Message (if you have MyChart) OR A paper copy in the mail If you have any lab test that is abnormal or we need to change your treatment, we will call you to review the results.  Testing/Procedures: Your physician has requested that you have Left atrial appendage (LAA) closure device implantation is a procedure to put a small device in the LAA of the heart. The LAA is a small sac in the wall of the heart's left upper chamber. Blood clots can form in this area. The device, Watchman closes the LAA to help prevent a blood clot and stroke.    Follow-Up: At [facility], you and your health needs are our priority.  As part of our continuing mission to provide you with exceptional heart care, our providers are all part of one team.  This team includes your primary Cardiologist (physician) and Advanced Practice Providers or APPs (Physician Assistants and Nurse Practitioners) who all work together to provide you with the care you need, when you need it.  Your next appointment:   As scheduled

## 2024-05-06 NOTE — Progress Notes (Signed)
 [facility] - Clinic Note  05/12/2024     CHIEF COMPLAINT Patient presents for Retina Follow Up  HISTORY OF PRESENT ILLNESS: Dana Sandoval is a 83 y.o. 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  This started 7 weeks ago.  I, the attending physician,  performed the HPI with the patient and updated documentation appropriately.        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 Patient states vision no difference. No eye pain. Having a watchman put in for Afib Nov 6th.      Last edited by Valdemar Rogue, MD on 05/12/2024  1:00 PM.     When looking down and then looks up she sees a little circle. The circle has been lingering. Next week she is having the watchman procedure.  Referring physician: [name], MD [address] [place],  [place] [address]  HISTORICAL INFORMATION:  Selected notes from the MEDICAL RECORD NUMBER Referred by Dr. CANDIE Gaudy for eval of ret heme OD   CURRENT MEDICATIONS: No current outpatient medications on file. (Ophthalmic Drugs)   No current facility-administered medications for this visit. (Ophthalmic Drugs)   Current Outpatient Medications (Other)  Medication Sig   amiodarone  (PACERONE ) 200 MG tablet Take 0.5 tablets (100 mg total) by mouth daily.   amoxicillin  (AMOXIL ) 500 MG capsule Take 4 capsules (2,000 mg total) by mouth 1 hour prior to dental work including cleanings as directed.   Ascorbic Acid (VITAMIN C PO) Take 500 mg by mouth in the morning.   atorvastatin  (LIPITOR) 40 MG tablet Take 1 tablet (40 mg total) by mouth daily at [facility].   Cholecalciferol 50 MCG (2000 UT) TBDP Take 50 mcg by mouth daily.   Docosanol (ABREVA) 10 % CREA Apply 1 application  topically as needed (fever blister).   ferrous sulfate  (FEROSUL) 325 (65 FE) MG tablet Take 1 tablet (325 mg total) by mouth 2 (two) times daily with a meal.    levothyroxine  (SYNTHROID ) 88 MCG tablet Take 1 tablet (88 mcg total) by mouth daily.   losartan -hydrochlorothiazide  (HYZAAR ) 50-12.5 MG tablet Take 1 tablet by mouth every morning.   Multiple Vitamins-Minerals (PRESERVISION AREDS 2 PO) Take 1 tablet by mouth 2 (two) times daily.    OneTouch Delica Lancets 33G MISC use one in the morning, at noon, and at bedtime to check blood glucose levels   pantoprazole  (PROTONIX ) 40 MG tablet Take 1 tablet (40 mg total) by mouth daily.   pyridoxine (B-6) 100 MG tablet Take 100 mg by mouth daily.   rOPINIRole  (REQUIP ) 0.25 MG tablet Take 1 tablet (0.25 mg total) by mouth at bedtime.   valACYclovir  (VALTREX ) 500 MG tablet Take 1 tablet (500 mg total) by mouth daily as needed (fever blister).   aspirin  EC 81 MG tablet Take 1 tablet (81 mg total) by mouth daily. Swallow whole.   clopidogrel  (PLAVIX ) 75 MG tablet Take 1 tablet (75 mg total) by mouth daily.   Cyanocobalamin  (VITAMIN B-12) 1000 MCG/15ML LIQD Take 1 mL by mouth 3 (three) times a week.   glucose blood (ONETOUCH VERIO) test strip use to test blood glucose levels in the morning, at noon, and at bedtime.   No current facility-administered medications for this visit. (Other)   REVIEW OF SYSTEMS: ROS   Positive for:  Endocrine, Cardiovascular, Eyes Negative for: Constitutional, Gastrointestinal, Neurological, Skin, Genitourinary, Musculoskeletal, HENT, Respiratory, Psychiatric, Allergic/Imm, Heme/Lymph Last edited by Orval Asberry RAMAN, COA on 05/12/2024  8:47 AM.      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 Wonda and Dr. Shyrl   Severe aortic stenosis    Thyroid  disease    HYPOTHYROIDISM   Past Surgical History:  Procedure Laterality Date   ABDOMINAL HYSTERECTOMY  1977   TAH  (FIBROIDS)   CATARACT EXTRACTION Bilateral    COLONOSCOPY N/A 12/12/2023   Procedure: COLONOSCOPY;  Surgeon: Kristie Lamprey, MD;  Location: [facility] ENDOSCOPY;  Service: Gastroenterology;  Laterality: N/A;   DILATION AND CURETTAGE OF UTERUS     DOPPLER ECHOCARDIOGRAPHY  2009   HAD AN ECHOCARDIOGRAM THAT SHOWED MODERATE MITRAL  ANNULAR CALCIFICATION AND A NORMAL EJECTION FRACTION OF 55%   ESOPHAGOGASTRODUODENOSCOPY N/A 12/12/2023   Procedure: EGD (ESOPHAGOGASTRODUODENOSCOPY);  Surgeon: Kristie Lamprey, MD;  Location: [facility] ENDOSCOPY;  Service: Gastroenterology;  Laterality: N/A;   GIVENS CAPSULE STUDY N/A 12/12/2023   Procedure: IMAGING PROCEDURE, GI TRACT, INTRALUMINAL, VIA CAPSULE;  Surgeon: Kristie Lamprey, MD;  Location: [facility] ENDOSCOPY;  Service: Gastroenterology;  Laterality: N/A;   INTRAOPERATIVE TRANSTHORACIC ECHOCARDIOGRAM N/A 01/01/2024   Procedure: ECHOCARDIOGRAM, TRANSTHORACIC;  Surgeon: Wonda Sharper, MD;  Location: [facility] INVASIVE CV LAB;  Service: Cardiovascular;  Laterality: N/A;   LEFT ATRIAL APPENDAGE OCCLUSION N/A 05/15/2024   Procedure: LEFT ATRIAL APPENDAGE OCCLUSION;  Surgeon: Almetta Donnice LABOR, MD;  Location: [facility] INVASIVE CV LAB;  Service: Cardiovascular;  Laterality: N/A;   RIGHT HEART CATH AND CORONARY ANGIOGRAPHY N/A 10/24/2023   Procedure: RIGHT HEART CATH AND CORONARY ANGIOGRAPHY;  Surgeon: Ladona Heinz, MD;  Location: [facility] INVASIVE CV LAB;  Service: Cardiovascular;  Laterality: N/A;   SHOULDER SURGERY     RIGHT SHOULDER SURGERY   THYROIDECTOMY     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   RIGHT   TOTAL KNEE ARTHROPLASTY Left 09/10/2018   Procedure: TOTAL KNEE ARTHROPLASTY;  Surgeon: Sheril Coy, MD;  Location: [facility] ORS;   Service: Orthopedics;  Laterality: Left;   TRANSESOPHAGEAL ECHOCARDIOGRAM (CATH LAB) N/A 05/15/2024   Procedure: TRANSESOPHAGEAL ECHOCARDIOGRAM;  Surgeon: Almetta Donnice LABOR, MD;  Location: [facility] INVASIVE CV LAB;  Service: Cardiovascular;  Laterality: N/A;   FAMILY HISTORY Family History  Problem Relation Age of Onset   Osteoporosis Mother    Heart disease Mother    Macular degeneration Mother    Heart disease Father    Cancer Father        BRAIN TUMOR   Cancer Brother        BRAIN TUMOR   Diabetes Maternal Aunt    Diabetes Maternal Uncle    Stroke Paternal Grandfather    Colon cancer Neg Hx    Breast cancer Neg Hx    Migraines Neg Hx    SOCIAL HISTORY Social History   Tobacco  Use   Smoking status: Never   Smokeless tobacco: Never  Vaping Use   Vaping status: Never Used  Substance Use Topics   Alcohol use: No    Alcohol/week: 0.0 standard drinks of alcohol   Drug use: No       OPHTHALMIC EXAM: Base Eye Exam     Visual Acuity (Snellen - Linear)       Right Left   Dist [place] 20/40 -2 20/30 -2         Tonometry (Tonopen, 8:45 AM)       Right Left   Pressure 17 20         Pupils       Dark Light Shape React APD   Right 3 2 Round Brisk None   Left 3 2 Round Brisk None         Visual Fields (Counting fingers)       Left Right    Full Full         Extraocular Movement       Right Left    Full, Ortho Full, Ortho         Neuro/Psych     Oriented x3: Yes   Mood/Affect: Normal         Dilation     Both eyes: 1.0% Mydriacyl, 2.5% Phenylephrine  @ 8:45 AM           Slit Lamp and Fundus Exam     Slit Lamp Exam       Right Left   Lids/Lashes Dermatochalasis - upper lid Dermatochalasis - upper lid   Conjunctiva/Sclera White and quiet White and quiet   Cornea 1+ Punctate epithelial erosions, fine endo pigment, trace tear film debris 1+ fine Punctate epithelial erosions, fine endo pigment, trace tear film debris   Anterior Chamber Deep  and quiet Deep and quiet   Iris Round and dilated Round and dilated   Lens Toric PC IOL with marks at 1200 and 0600, 1+ Posterior capsular opacification nasal side approaching visual axis Toric PC IOL with marks at 1200 and 0600, 1+ Posterior capsular opacification   Anterior Vitreous Vitreous syneresis, trace pigment, Posterior vitreous detachment, vitreous condensations Vitreous syneresis, Posterior vitreous detachment, vitreous condensations         Fundus Exam       Right Left   Disc mild Pallor, Sharp rim, +cupping, PPA mild Pallor, Sharp rim, +cupping, PPA   C/D Ratio 0.7 0.7   Macula Blunted foveal reflex, central CNV with subretinal heme -- stably resolved -- now with pigment clumping, RPE mottling, clumping and atrophy, Drusen, stable improvement in shallow SRF overlying CNV, trace cystic changes temporal mac -- stably improved Flat, Blunted foveal reflex, Drusen, RPE mottling, clumping and atrophy, focal IRH/SRH inferior to fovea -- stably improved, scattered patches of GA, no heme or fluid   Vessels attenuated, Tortuous attenuated, mild tortuosity   Periphery Attached, reticular degeneration, paving stone degeneration IT quad, No heme Attached, reticular degeneration, mild inferior paving stone degeneration, No heme           IMAGING AND PROCEDURES  Imaging and Procedures for 05/12/2024  OCT, Retina - OU - Both Eyes       Right Eye Quality was good. Central Foveal Thickness: 418. Progression has been stable. Findings include no SRF, abnormal foveal contour, retinal drusen , subretinal hyper-reflective material, intraretinal fluid, pigment epithelial detachment, outer retinal atrophy (persistent PED- slightly improved, stable improvement in IRF / cystic changes, and SRF overlying PED, +  ORA).   Left Eye Quality was good. Central Foveal Thickness: 259. Progression has been stable. Findings include normal foveal contour, no IRF, no SRF, retinal drusen , outer retinal atrophy  (Stable improvement in focal central SRHM ).   Notes *Images captured and stored on drive  Diagnosis / Impression:  Ex ARMD OU OD: persistent PED- slightly improved, stable improvement in IRF / cystic changes, and SRF overlying PED, +ORA OS: Stable improvement in focal central SRHM   Clinical management:  See below  Abbreviations: NFP - Normal foveal profile. CME - cystoid macular edema. PED - pigment epithelial detachment. IRF - intraretinal fluid. SRF - subretinal fluid. EZ - ellipsoid zone. ERM - epiretinal membrane. ORA - outer retinal atrophy. ORT - outer retinal tubulation. SRHM - subretinal hyper-reflective material. IRHM - intraretinal hyper-reflective material      Intravitreal Injection, Pharmacologic Agent - OD - Right Eye       Time Out 05/12/2024. 10:08 AM. Confirmed correct patient, procedure, site, and patient consented.   Anesthesia Topical anesthesia was used. Anesthetic medications included Lidocaine  2%, Proparacaine 0.5%.   Procedure Preparation included 5% betadine to ocular surface, eyelid speculum. A supplied (32g) needle was used.   Injection: 6 mg faricimab -svoa 6 MG/0.05ML (Patient supplied)   Route: Intravitreal, Site: Right Eye   NDC: 49757-903-93, Lot: [id number], Expiration date: 05/08/2025, Waste: 0 mL   Post-op Post injection exam found visual acuity of at least counting fingers. The patient tolerated the procedure well. There were no complications. The patient received written and verbal post procedure care education. Post injection medications were not given.   Notes **PAP MEDICATION ADMINISTERED**      Intravitreal Injection, Pharmacologic Agent - OS - Left Eye       Time Out 05/12/2024. 10:09 AM. Confirmed correct patient, procedure, site, and patient consented.   Anesthesia Topical anesthesia was used. Anesthetic medications included Lidocaine  2%, Proparacaine 0.5%.   Procedure Preparation included 5% betadine to ocular surface,  eyelid speculum. A supplied (33g) needle was used.   Injection: 6 mg faricimab -svoa 6 MG/0.05ML (Patient supplied)   Route: Intravitreal, Site: Left Eye   NDC: 49757-903-93, Lot: [id number], Expiration date: 05/08/2025, Waste: 0 mL   Post-op Post injection exam found visual acuity of at least counting fingers. The patient tolerated the procedure well. There were no complications. The patient received written and verbal post procedure care education.   Notes **PAP MEDICATION ADMINISTERED**             ASSESSMENT/PLAN:   ICD-10-CM   1. Exudative age-related macular degeneration of right eye with active choroidal neovascularization (HCC)  H35.3211 OCT, Retina - OU - Both Eyes    Intravitreal Injection, Pharmacologic Agent - OD - Right Eye    faricimab -svoa (VABYSMO ) 6mg /0.34mL intravitreal injection    2. Exudative age-related macular degeneration of left eye with active choroidal neovascularization (HCC)  H35.3221 OCT, Retina - OU - Both Eyes    Intravitreal Injection, Pharmacologic Agent - OS - Left Eye    faricimab -svoa (VABYSMO ) 6mg /0.78mL intravitreal injection    3. Essential hypertension  I10     4. Hypertensive retinopathy of both eyes  H35.033     5. Pseudophakia, both eyes  Z96.1     6. PCO (posterior capsule opacification), bilateral  H26.493      1. Exudative age related macular degeneration, OD - s/p IVA OD #1 (05.06.22), #2 (06.03.22), #3 (07.01.22), #4 (07.29.22), #5 (08.26.22), #6 (09.23.22), #7 (10.21.22), #8 (11.18.22), #9 (12.16.22), #10 (01.20.23) -- IVA  resistance ===================================== - s/p IVE OD #1 (02.24.23) -- sample, #2 (03.24.23), #3 (04.21.23), #4 (05.19.23), #5 (06.16.23), #6 (07.21.23), #7 (09.01.23), #8 (10.27.23), #9 (12.15.23), #10 (02.02.24), #11 (03.29.24), #12 (06.06.24) #13 (07.26.24), #14 (09.13.24), #15 (10.31.24), #16 (12.20.24) -- IVE resistance ====================================== - s/p IVV OD #1 (02.07.25, sample), #2  (03.21.25, sample), #3 (05.02.25, sample), #4 (06.16.25, sample), #6 (08.01.25 sample), #7 (PAP--09.19.25) - OCT shows OD: persistent PED- slightly improved, stable improvement in IRF / cystic changes, and SRF overlying PED, +ORA at 7 weeks  - BCVA 20/40 OD - stable - recommend IVV today OD #8 (PAP-11.03.25) w/ f/u ext to 8 wks  - RBA of procedure discussed, questions answered - see procedure note - IVV informed consent obtained and signed, 02.07.25 (OD)  - f/u 8 wks -- DFE/OCT, possible injection  2. Exudative age related macular degeneration, left eye   - early conversion to exudative ARMD -- noted on 01.04.24 - s/p IVA OS #1 (01.04.24), #2 (02.02.24), #3 (03.01.24), #4 (03.29.24), #5 (05.03.24), #6 (06.06.24), #7 (07.26.24), #8 (09.13.24), #9 (10.31.24), #10 (12.20.24), #11 (02.07.25), #12 (06.16.25) #13 (08.01.25) ============== -s/p IVV OS #1 (PAP--09.19.25)  - BCVA OS 20/30 stable - OCT shows OS: Stable improvement in focal central SRHM at 7 weeks - recommend IVV today OS #2 (PAP-11.03.25) with f/u ext to 8 weeks - pt wishes to proceed with injection - RBA of procedure discussed, questions answered - informed consent obtained and signed - see procedure note - IVV informed consent obtained and signed, 09.19.25 (OS)  - f/u in 8 wks, DFE, OCT, possible injection  3,4. Hypertensive retinopathy OU - discussed importance of tight BP control - monitor  5. Pseudophakia OU  - s/p CE/IOL  - IOL in good position, doing well  - monitor   6. PCO OU (OD > OS)  - continue to monitor   Ophthalmic Meds Ordered this visit:  Meds ordered this encounter  Medications   faricimab -svoa (VABYSMO ) 6mg /0.2mL intravitreal injection   faricimab -svoa (VABYSMO ) 6mg /0.56mL intravitreal injection     Return in about 8 weeks (around 07/07/2024) for f/u, Ex. AMD, DFE, OCT, Possible, IVV, PAP medication, OU.  There are no Patient Instructions on file for this visit.  This document serves as a  record of services personally performed by Redell JUDITHANN Hans, MD, PhD. It was created on their behalf by Avelina Pereyra, COA an ophthalmic technician. The creation of this record is the provider's dictation and/or activities during the visit.   Electronically signed by: Avelina GORMAN Pereyra, COT  05/18/24  3:50 PM   This document serves as a record of services personally performed by Redell JUDITHANN Hans, MD, PhD. It was created on their behalf by Wanda GEANNIE Keens, COT an ophthalmic technician. The creation of this record is the provider's dictation and/or activities during the visit.    Electronically signed by:  Wanda GEANNIE Keens, COT  05/18/24 3:50 PM  Redell JUDITHANN Hans, M.D., Ph.D. Diseases & Surgery of the Retina and Vitreous [facility]  I have reviewed the above documentation for accuracy and completeness, and I agree with the above. Redell JUDITHANN Hans, M.D., Ph.D. 05/18/24 3:52 PM   Abbreviations: M myopia (nearsighted); A astigmatism; H hyperopia (farsighted); P presbyopia; Mrx spectacle prescription;  CTL contact lenses; OD right eye; OS left eye; OU both eyes  XT exotropia; ET esotropia; PEK punctate epithelial keratitis; PEE punctate epithelial erosions; DES dry eye syndrome; MGD meibomian gland dysfunction; ATs artificial tears; PFAT's preservative free artificial  tears; NSC nuclear sclerotic cataract; PSC posterior subcapsular cataract; ERM epi-retinal membrane; PVD posterior vitreous detachment; RD retinal detachment; DM diabetes mellitus; DR diabetic retinopathy; NPDR non-proliferative diabetic retinopathy; PDR proliferative diabetic retinopathy; CSME clinically significant macular edema; DME diabetic macular edema; dbh dot blot hemorrhages; CWS cotton wool spot; POAG primary open angle glaucoma; C/D cup-to-disc ratio; HVF humphrey visual field; GVF goldmann visual field; OCT optical coherence tomography; IOP intraocular pressure; BRVO Branch retinal vein occlusion; CRVO  central retinal vein occlusion; CRAO central retinal artery occlusion; BRAO branch retinal artery occlusion; RT retinal tear; SB scleral buckle; PPV pars plana vitrectomy; VH Vitreous hemorrhage; PRP panretinal laser photocoagulation; IVK intravitreal kenalog; VMT vitreomacular traction; MH Macular hole;  NVD neovascularization of the disc; NVE neovascularization elsewhere; AREDS age related eye disease study; ARMD age related macular degeneration; POAG primary open angle glaucoma; EBMD epithelial/anterior basement membrane dystrophy; ACIOL anterior chamber intraocular lens; IOL intraocular lens; PCIOL posterior chamber intraocular lens; Phaco/IOL phacoemulsification with intraocular lens placement; PRK photorefractive keratectomy; LASIK laser assisted in situ keratomileusis; HTN hypertension; DM diabetes mellitus; COPD chronic obstructive pulmonary disease

## 2024-05-07 LAB — CBC
Hematocrit: 31.6 % — AB (ref 34.0–46.6)
Hemoglobin: 10 g/dL — AB (ref 11.1–15.9)
MCH: 31 pg (ref 26.6–33.0)
MCHC: 31.6 g/dL (ref 31.5–35.7)
MCV: 98 fL — AB (ref 79–97)
Platelets: 393 x10E3/uL (ref 150–450)
RBC: 3.23 x10E6/uL — AB (ref 3.77–5.28)
RDW: 12.9 % (ref 11.7–15.4)
WBC: 8.4 x10E3/uL (ref 3.4–10.8)

## 2024-05-07 LAB — BASIC METABOLIC PANEL WITH GFR
BUN/Creatinine Ratio: 19 (ref 12–28)
BUN: 23 mg/dL (ref 8–27)
CO2: 23 mmol/L (ref 20–29)
Calcium: 9.1 mg/dL (ref 8.7–10.3)
Chloride: 104 mmol/L (ref 96–106)
Creatinine, Ser: 1.19 mg/dL — ABNORMAL HIGH (ref 0.57–1.00)
Glucose: 142 mg/dL — ABNORMAL HIGH (ref 70–99)
Potassium: 4.2 mmol/L (ref 3.5–5.2)
Sodium: 142 mmol/L (ref 134–144)
eGFR: 45 mL/min/1.73 — ABNORMAL LOW (ref >59)

## 2024-05-07 NOTE — Telephone Encounter (Signed)
 Spoke with patient. She does not recall exactly what she needed the day that she called the office.   She has been seen by Dr Nola office as of 04/15/24 nad has had 2 iron infusions since.   She does not need anything further from our office at this time pertaining to her GI Referral.   Nothing further needed.

## 2024-05-08 ENCOUNTER — Inpatient Hospital Stay

## 2024-05-08 ENCOUNTER — Telehealth: Payer: Self-pay

## 2024-05-08 VITALS — BP 129/48 | HR 50 | Temp 96.0°F | Resp 18

## 2024-05-08 DIAGNOSIS — N1832 Chronic kidney disease, stage 3b: Secondary | ICD-10-CM

## 2024-05-08 DIAGNOSIS — D649 Anemia, unspecified: Secondary | ICD-10-CM | POA: Diagnosis not present

## 2024-05-08 MED ORDER — SODIUM CHLORIDE 0.9% FLUSH
10.0000 mL | Freq: Once | INTRAVENOUS | Status: AC | PRN
  Administered 2024-05-08: 10 mL
  Filled 2024-05-08: qty 10

## 2024-05-08 MED ORDER — IRON SUCROSE 20 MG/ML IV SOLN
200.0000 mg | Freq: Once | INTRAVENOUS | Status: AC
  Administered 2024-05-08: 200 mg via INTRAVENOUS
  Filled 2024-05-08: qty 10

## 2024-05-08 NOTE — Telephone Encounter (Signed)
 Santo Stanly LABOR, MD  Josue Lamarr LABOR, RN Patent windsock appendage.  ~ 27 mm Watchman FLX.  Double Curve.  Can't look at the Fluoro angle on PACS.  Should be straightforward anatomy.  Thanks, MAC

## 2024-05-08 NOTE — Telephone Encounter (Signed)
 Patient returned call. Reviewed new arrival time of 0530.  Confirmed procedure date of 05/15/2024. Confirmed arrival time of 0530 for procedure time at 0730. Reviewed pre-procedure instructions with patient. Contrast allergy? No PPM or defibrillator? No The patient understands to call if questions/concerns arise prior to procedure. The patient was grateful for call and agreed with plan.

## 2024-05-08 NOTE — Telephone Encounter (Signed)
 Called patient to do pre-watchman call. Will need to adjust arrival time of 0530 instead of 0645. Spoke with patient's spouse and she will call back.

## 2024-05-10 ENCOUNTER — Encounter: Payer: Self-pay | Admitting: Student in an Organized Health Care Education/Training Program

## 2024-05-10 NOTE — Progress Notes (Signed)
 Messaged Dr. Babara for rescheduling of the infusion appt this Thursday for Venofer.   NYHA class I sx for pre procedural documentation.   Dana DELENA Primus, MD [facility]  Cardiac Electrophysiology

## 2024-05-12 ENCOUNTER — Encounter (INDEPENDENT_AMBULATORY_CARE_PROVIDER_SITE_OTHER): Payer: Self-pay | Admitting: Ophthalmology

## 2024-05-12 ENCOUNTER — Inpatient Hospital Stay

## 2024-05-12 ENCOUNTER — Telehealth: Payer: Self-pay | Admitting: Oncology

## 2024-05-12 ENCOUNTER — Ambulatory Visit (INDEPENDENT_AMBULATORY_CARE_PROVIDER_SITE_OTHER): Admitting: Ophthalmology

## 2024-05-12 DIAGNOSIS — Z961 Presence of intraocular lens: Secondary | ICD-10-CM | POA: Diagnosis not present

## 2024-05-12 DIAGNOSIS — I10 Essential (primary) hypertension: Secondary | ICD-10-CM | POA: Diagnosis not present

## 2024-05-12 DIAGNOSIS — H353231 Exudative age-related macular degeneration, bilateral, with active choroidal neovascularization: Secondary | ICD-10-CM

## 2024-05-12 DIAGNOSIS — H353221 Exudative age-related macular degeneration, left eye, with active choroidal neovascularization: Secondary | ICD-10-CM

## 2024-05-12 DIAGNOSIS — H26493 Other secondary cataract, bilateral: Secondary | ICD-10-CM

## 2024-05-12 DIAGNOSIS — H35033 Hypertensive retinopathy, bilateral: Secondary | ICD-10-CM | POA: Diagnosis not present

## 2024-05-12 DIAGNOSIS — H353211 Exudative age-related macular degeneration, right eye, with active choroidal neovascularization: Secondary | ICD-10-CM

## 2024-05-12 MED ORDER — FARICIMAB-SVOA 6 MG/0.05ML IZ SOSY
6.0000 mg | PREFILLED_SYRINGE | INTRAVITREAL | Status: AC | PRN
  Administered 2024-05-12: 6 mg via INTRAVITREAL

## 2024-05-12 NOTE — Telephone Encounter (Signed)
 Patient had concerns regarding infusion appt needing to be rescheduled due to watchman procedure. It appears appts have been rescheduled.  Left voicemail for patient to return call to confirm appts have been taken care of.

## 2024-05-12 NOTE — Telephone Encounter (Signed)
 Patient had returned call and confirm appts were rescheduled. She was grateful for the assistance.

## 2024-05-12 NOTE — Telephone Encounter (Signed)
 Received inbasket that pt is scheduled for a watchman implant on 11/6 and iron appts would need to be r/s.   I have r/s 11/6 iron appt to 11/14.   I called and left vm for pt with the above information.

## 2024-05-14 NOTE — Anesthesia Preprocedure Evaluation (Signed)
 Anesthesia Evaluation  Patient identified by MRN, date of birth, ID band Patient awake    Reviewed: Allergy & Precautions, NPO status , Patient's Chart, lab work & pertinent test results  History of Anesthesia Complications Negative for: history of anesthetic complications  Airway Mallampati: II  TM Distance: >3 FB     Dental  (+) Dental Advisory Given, Implants, Teeth Intact   Pulmonary neg shortness of breath, neg COPD   breath sounds clear to auscultation       Cardiovascular hypertension, Pt. on medications pulmonary hypertension+ Valvular Problems/Murmurs (Mild-Mod TR) MR  Rhythm:Regular Rate:Normal + Systolic murmurs Echo 02/2024  1. Left ventricular ejection fraction, by estimation, is 65 to 70%. The  left ventricle has normal function. The left ventricle has no regional  wall motion abnormalities. There is moderate concentric left ventricular  hypertrophy. Left ventricular  diastolic parameters are indeterminate.   2. Right ventricular systolic function is normal. The right ventricular  size is normal. There is moderately elevated pulmonary artery systolic  pressure. The estimated right ventricular systolic pressure is 52.3 mmHg.   3. Left atrial size was moderately dilated.   4. The mitral valve is abnormal. Severe mitral valve regurgitation. No  evidence of mitral stenosis. Severe mitral annular calcification.   5. Tricuspid valve regurgitation is mild to moderate.   6. The aortic valve has been repaired/replaced. Aortic valve  regurgitation is not visualized. No aortic stenosis is present. Echo  findings are consistent with normal structure and function of the aortic  valve prosthesis. Aortic valve area, by VTI  measures 1.37 cm. Aortic valve mean gradient measures 9.0 mmHg. Aortic  valve Vmax measures 2.22 m/s.   7. The inferior vena cava is normal in size with greater than 50%  respiratory variability, suggesting  right atrial pressure of 3 mmHg.       1. Left ventricular ejection fraction, by estimation, is 60 to 65%. The  left ventricle has normal function. The left ventricle has no regional  wall motion abnormalities. There is mild left ventricular hypertrophy of  the basal-septal segment. Left  ventricular diastolic parameters are indeterminate. Elevated left  ventricular end-diastolic pressure. The average left ventricular global  longitudinal strain is -16.0 %. The global longitudinal strain is  abnormal.   2. Right ventricular systolic function is normal. The right ventricular  size is normal. There is moderately elevated pulmonary artery systolic  pressure. The estimated right ventricular systolic pressure is 51.4 mmHg.   3. The mitral valve is degenerative. Mild to moderate mitral valve  regurgitation. Mild mitral stenosis. The mean mitral valve gradient is 4.5  mmHg. Severe mitral annular calcification.   4. The aortic valve is calcified. There is severe calcifcation of the  aortic valve. There is severe thickening of the aortic valve. Aortic valve  regurgitation is trivial. Severe aortic valve stenosis. Aortic valve area,  by VTI measures 0.41 cm. Aortic  valve mean gradient measures 48.0 mmHg. Aortic valve Vmax measures 4.41  m/s.   5. The inferior vena cava is normal in size with greater than 50%  respiratory variability, suggesting right atrial pressure of 3 mmHg.   6. Compared to study dated 09/29/21, the mean AVG has increased from 25 to  , VMax has increased from 3.42 to 4.59m/s and DVI has decreased from  0.3 to 0.18.     Neuro/Psych  Headaches TIA Neuromuscular disease    GI/Hepatic ,neg GERD  ,,(+) neg Cirrhosis        Endo/Other  diabetesHypothyroidism    Renal/GU CRFRenal disease     Musculoskeletal  (+) Arthritis ,    Abdominal  (+) + obese  Peds  Hematology  (+) Blood dyscrasia, anemia   Anesthesia Other Findings    Reproductive/Obstetrics                              Anesthesia Physical Anesthesia Plan  ASA: 3  Anesthesia Plan: General   Post-op Pain Management: Tylenol  PO (pre-op)*   Induction: Intravenous  PONV Risk Score and Plan: 2 and Ondansetron , Dexamethasone  and Treatment may vary due to age or medical condition  Airway Management Planned: Oral ETT  Additional Equipment: ClearSight  Intra-op Plan:   Post-operative Plan: Extubation in OR  Informed Consent: I have reviewed the patients History and Physical, chart, labs and discussed the procedure including the risks, benefits and alternatives for the proposed anesthesia with the patient or authorized representative who has indicated his/her understanding and acceptance.     Dental advisory given  Plan Discussed with: CRNA  Anesthesia Plan Comments:          Anesthesia Quick Evaluation

## 2024-05-15 ENCOUNTER — Inpatient Hospital Stay (HOSPITAL_COMMUNITY)
Admission: RE | Admit: 2024-05-15 | Discharge: 2024-05-15 | DRG: 274 | Disposition: A | Discharge status: Home / Self Care | Attending: Student in an Organized Health Care Education/Training Program | Admitting: Student in an Organized Health Care Education/Training Program

## 2024-05-15 ENCOUNTER — Inpatient Hospital Stay

## 2024-05-15 ENCOUNTER — Inpatient Hospital Stay (HOSPITAL_COMMUNITY): Payer: Self-pay | Admitting: Certified Registered Nurse Anesthetist

## 2024-05-15 ENCOUNTER — Inpatient Hospital Stay (HOSPITAL_COMMUNITY)
Admission: RE | Disposition: A | Source: Home / Self Care | Attending: Student in an Organized Health Care Education/Training Program

## 2024-05-15 ENCOUNTER — Other Ambulatory Visit: Payer: Self-pay

## 2024-05-15 ENCOUNTER — Encounter (HOSPITAL_COMMUNITY): Payer: Self-pay | Admitting: Student in an Organized Health Care Education/Training Program

## 2024-05-15 ENCOUNTER — Other Ambulatory Visit (HOSPITAL_COMMUNITY): Payer: Self-pay

## 2024-05-15 ENCOUNTER — Encounter: Payer: Self-pay | Admitting: Oncology

## 2024-05-15 ENCOUNTER — Inpatient Hospital Stay (HOSPITAL_COMMUNITY)

## 2024-05-15 DIAGNOSIS — Z006 Encounter for examination for normal comparison and control in clinical research program: Secondary | ICD-10-CM

## 2024-05-15 DIAGNOSIS — Z95818 Presence of other cardiac implants and grafts: Principal | ICD-10-CM | POA: Diagnosis present

## 2024-05-15 DIAGNOSIS — E039 Hypothyroidism, unspecified: Secondary | ICD-10-CM | POA: Diagnosis present

## 2024-05-15 DIAGNOSIS — N1832 Chronic kidney disease, stage 3b: Secondary | ICD-10-CM

## 2024-05-15 DIAGNOSIS — Z952 Presence of prosthetic heart valve: Secondary | ICD-10-CM | POA: Diagnosis not present

## 2024-05-15 DIAGNOSIS — I447 Left bundle-branch block, unspecified: Secondary | ICD-10-CM | POA: Diagnosis not present

## 2024-05-15 DIAGNOSIS — E1122 Type 2 diabetes mellitus with diabetic chronic kidney disease: Secondary | ICD-10-CM

## 2024-05-15 DIAGNOSIS — E119 Type 2 diabetes mellitus without complications: Secondary | ICD-10-CM | POA: Diagnosis not present

## 2024-05-15 DIAGNOSIS — I480 Paroxysmal atrial fibrillation: Secondary | ICD-10-CM

## 2024-05-15 DIAGNOSIS — D649 Anemia, unspecified: Secondary | ICD-10-CM

## 2024-05-15 DIAGNOSIS — I129 Hypertensive chronic kidney disease with stage 1 through stage 4 chronic kidney disease, or unspecified chronic kidney disease: Secondary | ICD-10-CM | POA: Diagnosis not present

## 2024-05-15 DIAGNOSIS — Z8673 Personal history of transient ischemic attack (TIA), and cerebral infarction without residual deficits: Secondary | ICD-10-CM

## 2024-05-15 DIAGNOSIS — D500 Iron deficiency anemia secondary to blood loss (chronic): Secondary | ICD-10-CM | POA: Diagnosis not present

## 2024-05-15 DIAGNOSIS — K922 Gastrointestinal hemorrhage, unspecified: Secondary | ICD-10-CM

## 2024-05-15 DIAGNOSIS — E785 Hyperlipidemia, unspecified: Secondary | ICD-10-CM | POA: Diagnosis present

## 2024-05-15 DIAGNOSIS — I4891 Unspecified atrial fibrillation: Secondary | ICD-10-CM | POA: Diagnosis not present

## 2024-05-15 HISTORY — PX: LEFT ATRIAL APPENDAGE OCCLUSION: EP1229

## 2024-05-15 HISTORY — PX: TRANSESOPHAGEAL ECHOCARDIOGRAM (CATH LAB): EP1270

## 2024-05-15 LAB — ECHO TEE
AR max vel: 1.71 cm2
AV Area VTI: 1.97 cm2
AV Area mean vel: 1.99 cm2
AV Mean grad: 6 mmHg
AV Peak grad: 13.7 mmHg
Ao pk vel: 1.85 m/s
Area-P 1/2: 4.79 cm2
MV M vel: 3.98 m/s
MV Peak grad: 63.2 mmHg
MV VTI: 1.72 cm2
MV Vena cont: 0.39 cm
Radius: 0.74 cm

## 2024-05-15 LAB — TYPE AND SCREEN
ABO/RH(D): A POS
Antibody Screen: NEGATIVE

## 2024-05-15 LAB — SURGICAL PCR SCREEN
MRSA, PCR: NEGATIVE
Staphylococcus aureus: NEGATIVE

## 2024-05-15 LAB — GLUCOSE, CAPILLARY
Glucose-Capillary: 140 mg/dL — ABNORMAL HIGH (ref 70–99)
Glucose-Capillary: 138 mg/dL — ABNORMAL HIGH (ref 70–99)

## 2024-05-15 LAB — POCT ACTIVATED CLOTTING TIME: Activated Clotting Time: 418 s

## 2024-05-15 MED ORDER — CEFAZOLIN SODIUM-DEXTROSE 2-4 GM/100ML-% IV SOLN
2.0000 g | INTRAVENOUS | Status: AC
  Administered 2024-05-15: 2 g via INTRAVENOUS
  Filled 2024-05-15: qty 100

## 2024-05-15 MED ORDER — ASPIRIN 81 MG PO CHEW
81.0000 mg | CHEWABLE_TABLET | Freq: Every day | ORAL | Status: DC
  Administered 2024-05-15: 81 mg via ORAL

## 2024-05-15 MED ORDER — FENTANYL CITRATE (PF) 250 MCG/5ML IJ SOLN
INTRAMUSCULAR | Status: DC | PRN
  Administered 2024-05-15: 50 ug via INTRAVENOUS

## 2024-05-15 MED ORDER — IODIXANOL 320 MG/ML IV SOLN
INTRAVENOUS | Status: DC | PRN
  Administered 2024-05-15: 25 mL

## 2024-05-15 MED ORDER — PHENYLEPHRINE 80 MCG/ML (10ML) SYRINGE FOR IV PUSH (FOR BLOOD PRESSURE SUPPORT)
PREFILLED_SYRINGE | INTRAVENOUS | Status: DC | PRN
  Administered 2024-05-15 (×2): 80 ug via INTRAVENOUS
  Administered 2024-05-15 (×2): 40 ug via INTRAVENOUS

## 2024-05-15 MED ORDER — PROTAMINE SULFATE 10 MG/ML IV SOLN
INTRAVENOUS | Status: DC | PRN
Start: 2024-05-15 — End: 2024-05-15
  Administered 2024-05-15: 40 mg via INTRAVENOUS

## 2024-05-15 MED ORDER — SODIUM CHLORIDE 0.9 % IV SOLN: INTRAVENOUS | Status: DC

## 2024-05-15 MED ORDER — PHENYLEPHRINE HCL-NACL 20-0.9 MG/250ML-% IV SOLN
INTRAVENOUS | Status: DC | PRN
  Administered 2024-05-15: 30 ug/min via INTRAVENOUS

## 2024-05-15 MED ORDER — CHLORHEXIDINE GLUCONATE 0.12 % MT SOLN
OROMUCOSAL | Status: AC
Start: 2024-05-15 — End: 2024-05-15
  Administered 2024-05-15: 15 mL
  Filled 2024-05-15: qty 15

## 2024-05-15 MED ORDER — INSULIN ASPART 100 UNIT/ML IJ SOLN: 0.0000 [IU] | INTRAMUSCULAR | Status: DC | PRN

## 2024-05-15 MED ORDER — ASPIRIN 81 MG PO TBEC
81.0000 mg | DELAYED_RELEASE_TABLET | Freq: Every day | ORAL | 3 refills | Status: AC
  Filled 2024-05-15 – 2024-08-04 (×2): qty 90, 90d supply, fill #0

## 2024-05-15 MED ORDER — CLOPIDOGREL BISULFATE 75 MG PO TABS
75.0000 mg | ORAL_TABLET | Freq: Every day | ORAL | Status: DC
  Administered 2024-05-15: 75 mg via ORAL

## 2024-05-15 MED ORDER — PROPOFOL 10 MG/ML IV BOLUS
INTRAVENOUS | Status: DC | PRN
  Administered 2024-05-15: 80 mg via INTRAVENOUS

## 2024-05-15 MED ORDER — ROCURONIUM BROMIDE 10 MG/ML (PF) SYRINGE
PREFILLED_SYRINGE | INTRAVENOUS | Status: DC | PRN
  Administered 2024-05-15: 50 mg via INTRAVENOUS

## 2024-05-15 MED ORDER — LIDOCAINE HCL (PF) 1 % IJ SOLN
INTRAMUSCULAR | Status: AC
Start: 2024-05-15 — End: 2024-05-15
  Filled 2024-05-15: qty 90

## 2024-05-15 MED ORDER — CLOPIDOGREL BISULFATE 75 MG PO TABS
ORAL_TABLET | ORAL | Status: AC
  Filled 2024-05-15: qty 1

## 2024-05-15 MED ORDER — HEPARIN SODIUM (PORCINE) 1000 UNIT/ML IJ SOLN
INTRAMUSCULAR | Status: AC
  Filled 2024-05-15: qty 20

## 2024-05-15 MED ORDER — HEPARIN SODIUM (PORCINE) 1000 UNIT/ML IJ SOLN
INTRAMUSCULAR | Status: DC | PRN
Start: 2024-05-15 — End: 2024-05-15
  Administered 2024-05-15: 13000 [IU] via INTRAVENOUS

## 2024-05-15 MED ORDER — HEPARIN (PORCINE) IN NACL 1000-0.9 UT/500ML-% IV SOLN
INTRAVENOUS | Status: DC | PRN
  Administered 2024-05-15: 500 mL

## 2024-05-15 MED ORDER — SUGAMMADEX SODIUM 200 MG/2ML IV SOLN
INTRAVENOUS | Status: DC | PRN
  Administered 2024-05-15: 153.4 mg via INTRAVENOUS

## 2024-05-15 MED ORDER — LACTATED RINGERS IV SOLN: INTRAVENOUS | Status: DC

## 2024-05-15 MED ORDER — ONDANSETRON HCL 4 MG/2ML IJ SOLN: 4.0000 mg | Freq: Four times a day (QID) | INTRAMUSCULAR | Status: DC | PRN

## 2024-05-15 MED ORDER — CLOPIDOGREL BISULFATE 75 MG PO TABS
75.0000 mg | ORAL_TABLET | Freq: Every day | ORAL | 1 refills | Status: AC
  Filled 2024-05-15 – 2024-08-04 (×2): qty 90, 90d supply, fill #0

## 2024-05-15 MED ORDER — ACETAMINOPHEN 325 MG PO TABS
650.0000 mg | ORAL_TABLET | ORAL | Status: DC | PRN
Start: 2024-05-15 — End: 2024-05-15

## 2024-05-15 MED ORDER — CHLORHEXIDINE GLUCONATE 4 % EX SOLN: Freq: Once | CUTANEOUS | Status: DC

## 2024-05-15 MED ORDER — FENTANYL CITRATE (PF) 100 MCG/2ML IJ SOLN
INTRAMUSCULAR | Status: AC
  Filled 2024-05-15: qty 2

## 2024-05-15 MED ORDER — LIDOCAINE 2% (20 MG/ML) 5 ML SYRINGE
INTRAMUSCULAR | Status: DC | PRN
  Administered 2024-05-15: 70 mg via INTRAVENOUS

## 2024-05-15 MED ORDER — ONDANSETRON HCL 4 MG/2ML IJ SOLN
INTRAMUSCULAR | Status: DC | PRN
  Administered 2024-05-15: 4 mg via INTRAVENOUS

## 2024-05-15 MED ORDER — ASPIRIN 81 MG PO CHEW
CHEWABLE_TABLET | ORAL | Status: AC
  Filled 2024-05-15: qty 1

## 2024-05-15 NOTE — Progress Notes (Signed)
 Cath lab RN and Cardiology NP, state that the patient has received Asprin and Plavix  dose.

## 2024-05-15 NOTE — Op Note (Signed)
   Procedure:  Watchman FLX Pro implant (CPT [id number])  Indication: GIB  Attending: Adina Primus, MD   CHA2DS2-VASc Score = 5  This indicates a 7.2% annual risk of stroke. The patient's score is based upon: CHF History: 1 HTN History: 1 Diabetes History: 0 Stroke History: 0 Vascular Disease History: 0 Age Score: 2 Gender Score: 1   HAS-BLED = 3 >85 yo, prior bleeding event, past TIA   NYHA class I  Procedure Summary: Vascular ultrasound and standard access needle used to access the right femoral vein. Perclose ProStyle sutures were deployed (x2).  A 16 Fr Cook sheath was placed in the RFV and the 14 Fr OD WATCHMAN outer sheath with [employer] was advanced to the mid inferior septum.  TEE demonstrated an intact atrial septum and no evidence of thrombus. Echocardiographic examination of the left atrial appendage was reviewed in multiple views. LAA measurements: 19 mm (max width) x 18 mm (max depth). Based upon these images a 24 mm WATCHMAN FLX Pro device was selected. Heparinization with an ACT >350 seconds. Successful inferior and mid transeptal catheterization using the VersaCross Connect radiofrequency needle with TEE and fluoroscopic guidance.   We placed the wire in the left upper pulmonary vein, Versa Cross sheath pulled back into RA, and TEE was advanced into the LA over the guidewire. The outer Watchman sheath was advanced over the RF wire to the mid LAA. Multiple TEE views obtained to exclude thrombus and determine sizing of the Watchman device. LAP 24 mmHg. Contrast injection through the Watchman outer sheath in multiple views demonstrated a chicken wing morphology with significant trabeculations. Contast injections confirmed the TEE measurements.   The 24 mm WATCHMAN FLX Pro device was deployed. The device was well seated in its final position at the LAA ostium and was stable to multiple tug tests. There was no TEE evidence of leak and all of the PASS criteria were met  (position, anchor, size, and seal). The device was compressed 21-26 % in 4 different ICE views. < 1/3 mitral shoulder. The WATCHMAN was released and the catheters were withdrawn to the right atrium. Final TEE examination revealed no effusion and a stable WATCHMAN position. Protamine  was given. Perclose ProStyle sutures utilized for hemostasis.   Summary: 1. Successful implant of Watchman FLX Pro device  Recommendations: 1.    IP only procedure, discharge following admission  2.    Start DAPT (ASA 81 mg daily+plavix  75 mg daily) and continue for 6    months then continue monotherapy ASA thereafter 4.    Discharge home next day if no issues  5.    EP f/u to be scheduled   Donnice DELENA Primus, MD [facility]  Cardiac Electrophysiology

## 2024-05-15 NOTE — Progress Notes (Signed)
 Patient admitted post watchman procedure. Right femoral access site is WNL. No hematoma and no bleeding at site. Patient successfully ambulated hallway with out complications. Post ambulation, right femoral site remains WNL.   Patient is alert an oriented x4, in NSR, lung sounds clear and on room air. Patient denies pain.    05/15/24 1240  Vitals  Temp 97.7 F (36.5 C)  Temp Source Oral  BP (!) 147/61  MAP (mmHg) 85  BP Location Left Arm  BP Method Automatic  Patient Position (if appropriate) Lying  Pulse Rate 70  Pulse Rate Source Monitor  ECG Heart Rate 73  Resp 17  MEWS COLOR  MEWS Score Color Green  Oxygen Therapy  SpO2 100 %  O2 Device Room Air  MEWS Score  MEWS Temp 0  MEWS Systolic 0  MEWS Pulse 0  MEWS RR 0  MEWS LOC 0  MEWS Score 0

## 2024-05-15 NOTE — Anesthesia Postprocedure Evaluation (Signed)
 Anesthesia Post Note  Patient: Dana Sandoval  Procedure(s) Performed: LEFT ATRIAL APPENDAGE OCCLUSION TRANSESOPHAGEAL ECHOCARDIOGRAM     Patient location during evaluation: PACU Anesthesia Type: General Level of consciousness: awake and alert Pain management: pain level controlled Vital Signs Assessment: post-procedure vital signs reviewed and stable Respiratory status: spontaneous breathing, nonlabored ventilation, respiratory function stable and patient connected to nasal cannula oxygen Cardiovascular status: blood pressure returned to baseline and stable Postop Assessment: no apparent nausea or vomiting Anesthetic complications: no   There were no known notable events for this encounter.  Last Vitals:  Vitals:   05/15/24 0920 05/15/24 0925  BP: (!) 128/49 (!) 123/53  Pulse: 67 68  Resp: 11 14  Temp:    SpO2: 94% 96%    Last Pain:  Vitals:   05/15/24 0915  TempSrc: Oral  PainSc: 0-No pain                 Thom JONELLE Peoples

## 2024-05-15 NOTE — Progress Notes (Signed)
 Discharge Nurse Summary: DC order noted per MD. DC RN at bedside with patient. Patient agreeable with discharge plan, family present at the bedside. AVS printed/reviewed. PIV removed, skin intact. No DME needs. No home meds. Confirmed dtr Nena pickup meds from [facility]. CP/Edu resolved. Telemonitor returned to charging station. All belongings accounted for. Dressing to wound, CDI w/o new bleeding or drainage. See LDAs. Patient wheeled downstairs for discharge by private auto.   Rosario EMERSON Lund, RN

## 2024-05-15 NOTE — Discharge Summary (Cosign Needed Addendum)
 Electrophysiology Discharge Summary   Patient ID: Dana Sandoval,  MRN: [id number], DOB/AGE: [birth year] 83 y.o.  Admit date: 05/15/2024 Discharge date: 05/15/2024  Primary Care Physician: [name], MD  Primary Cardiologist: Dr. Ladona Structural: Dr. 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   TIA 7.   DM 8.   abdominal atherosclerosis and abdominal ectasia   Procedures This Admission:  Transeptal Puncture Intra-procedural TEE which showed no LAA thrombus Left atrial appendage occlusive device placement on 05/15/24 by Dr. Primus.  Final procedure report pending In d/w Dr. Primus, implant was successful with no early apparent complications post implant medication plan is ASA 81mg  daily w/Plavix  75mg  daily NO OAC  Brief HPI: Dana Sandoval is a 83 y.o. female with a history of Paroxysmal Atrial Fibrillation who was referred to Electrophysiology in the outpatient setting  At her consult with Dr. Primus out patient, no plans for pre-procedure [facility] given severe and unidentified source GIB, with plan for DAPT x 6 months following implant followed by ASA monotherapy thereafter.   Hospital Course:  The patient was admitted and underwent left atrial appendage occlusive device placement as above.  The patient was monitored in the post procedure setting and has done very well with no concerns. Given this, she is planned for same day discharge, her bed rest has completed, she has ambulated, groin remains stable and has had + urine OP. Left groin site has been stable  without evidence of hematoma or bleeding. Wound care and restrictions were reviewed with the patient.  She feels well, no CP, SOB VSS  The patient has been scheduled for post procedure follow up with EP APP in approximately 6 weeks. They will require dental SBE for 6 month post op and should refrain from dental work or cleanings for the first 45 days post implant. SBE to be RXd at follow up.   A repeat CT scan will be performed in approximately 60 days to ensure proper seal of the device.   Discharge w/ ASA 81mg  every day Plavix  75mg  QD   Physical Exam: Vitals:   05/15/24 1130 05/15/24 1200 05/15/24 1215 05/15/24 1240  BP: 126/62 119/61 (!) 130/54 (!) 147/61  Pulse: 67 66 68 70  Resp: (!) 21 12 14 17   Temp:    97.7 F (36.5 C)  TempSrc:    Oral  SpO2: 99% 98% 92% 100%  Weight:      Height:        GEN: Well nourished, well developed in no acute distress NECK: No JVD; No carotid bruits CARDIAC: Regular rate and rhythm, no murmurs, rubs, gallops RESPIRATORY:  Clear to auscultation without rales, wheezing or rhonchi  ABDOMEN: Soft, non-tender, non-distended EXTREMITIES:  No edema; No deformity. Groin site Stable     Discharge Medications:  Allergies as of 05/15/2024       Reactions   Metformin  And Related    diarrhea   Codeine Itching   After 3 days   Dilaudid [hydromorphone Hcl] Itching   After 3 days        Medication List     TAKE these medications  Abreva 10 % Crea Generic drug: Docosanol Apply 1 application  topically as needed (fever blister).   amiodarone  200 MG tablet Commonly known as: PACERONE  Take 0.5 tablets (100 mg total) by mouth daily.   amoxicillin  500 MG capsule Commonly known as: AMOXIL  Take 4 capsules (2,000 mg total) by mouth 1 hour prior to dental work including cleanings as directed.   aspirin  EC 81 MG tablet Take 1 tablet (81 mg total) by mouth daily. Swallow whole.   atorvastatin  40 MG tablet Commonly known as: LIPITOR Take 1  tablet (40 mg total) by mouth daily at [facility].   Cholecalciferol 50 MCG (2000 UT) Tbdp Take 50 mcg by mouth daily.   clopidogrel  75 MG tablet Commonly known as: Plavix  Take 1 tablet (75 mg total) by mouth daily.   FeroSul 325 (65 Fe) MG tablet Generic drug: ferrous sulfate  Take 1 tablet (325 mg total) by mouth 2 (two) times daily with a meal.   levothyroxine  88 MCG tablet Commonly known as: SYNTHROID  Take 1 tablet (88 mcg total) by mouth daily.   losartan -hydrochlorothiazide  50-12.5 MG tablet Commonly known as: Hyzaar  Take 1 tablet by mouth every morning.   OneTouch Delica Plus Lancet33G Misc use one in the morning, at noon, and at bedtime to check blood glucose levels   OneTouch Verio test strip Generic drug: glucose blood use to test blood glucose levels in the morning, at noon, and at bedtime.   pantoprazole  40 MG tablet Commonly known as: Protonix  Take 1 tablet (40 mg total) by mouth daily.   PRESERVISION AREDS 2 PO Take 1 tablet by mouth 2 (two) times daily.   pyridoxine 100 MG tablet Commonly known as: B-6 Take 100 mg by mouth daily.   rOPINIRole  0.25 MG tablet Commonly known as: REQUIP  Take 1 tablet (0.25 mg total) by mouth at bedtime.   valACYclovir  500 MG tablet Commonly known as: VALTREX  Take 1 tablet (500 mg total) by mouth daily as needed (fever blister).   Vitamin B-12 1000 MCG/15ML Liqd Take 1 mL by mouth 3 (three) times a week.   VITAMIN C PO Take 500 mg by mouth in the morning.        Disposition:  Home with usual follow up as in AVS  Duration of Discharge Encounter:  APP Time: 12 minutes  Signed, Charlies Macario Arthur, PA-C  05/15/2024 12:52 PM

## 2024-05-15 NOTE — Discharge Instructions (Signed)
 [facility] Procedure, Care After  Procedure MD: Dr. Almetta Olds Clinical Coordinator: Rockie Redman, RN  This sheet gives you information about how to care for yourself after your procedure. Your health care provider may also give you more specific instructions. If you have problems or questions, contact your health care provider.  What can I expect after the procedure? After the procedure, it is common to have: Bruising around your puncture site. Tenderness around your puncture site. Tiredness (fatigue).  Medication instructions It is very important to continue to take your blood thinner as directed by your doctor after the Watchman procedure. Call your procedure doctor's office with question or concerns. If you are on Coumadin (warfarin), you will have your INR checked the week after your procedure, with a goal INR of 2.0 - 3.0. Please follow your medication instructions on your discharge summary. Only take the medications listed on your discharge paperwork.  Follow up You will be seen in 6 weeks after your procedure You will have a repeat CT scan or Echocardiogram approximately 8 weeks after your procedure mark to check your device You will follow up the MD/APP who performed your procedure 6 months after your procedure The Watchman Clinical Coordinator will check in with you from time to time, including 1 and 2 years after your procedure.  NO DENTAL CLEANINGS FOR 45 days. After that, you will require antibiotics for dental procedures the first 6 months.   Follow these instructions at home: Puncture site care  Follow instructions from your health care provider about how to take care of your puncture site. Make sure you: If present, leave stitches (sutures), skin glue, or adhesive strips in place.  If a large square bandage is present, this may be removed 24 hours after surgery.  Check your puncture site every day for signs of infection. Check for: Redness, swelling, or  pain. Fluid or blood. If your puncture site starts to bleed, lie down on your back, apply firm pressure to the area, and contact your health care provider. Warmth. Pus or a bad smell. Driving Do not drive yourself home if you received sedation Do not drive for at least 4 days after your procedure or however long your health care provider recommends. (Do not resume driving if you have previously been instructed not to drive for other health reasons.) Do not spend greater than 1 hour at a time in a car for the first 3 days. Stop and take a break with a 5 minute walk at least every hour.  Do not drive or use heavy machinery while taking prescription pain medicine.  Activity Avoid activities that take a lot of effort, including exercise, for at least 7 days after your procedure. For the first 3 days, avoid sitting for longer than one hour at a time.  Avoid alcoholic beverages, signing paperwork, or participating in legal proceedings for 24 hours after receiving sedation Do not lift anything that is heavier than 10 lb (4.5 kg) for one week.  No sexual activity for 1 week.  Return to your normal activities as told by your health care provider. Ask your health care provider what activities are safe for you. General instructions Take over-the-counter and prescription medicines only as told by your health care provider. Do not use any products that contain nicotine or tobacco, such as cigarettes and e-cigarettes. If you need help quitting, ask your health care provider. You may shower after 24 hours, but Do not take baths, swim, or use a hot tub for  1 week.  Do not drink alcohol for 24 hours after your procedure. Keep all follow-up visits as told by your health care provider. This is important. Dental Work: You will require antibiotics prior to any dental work, including cleanings, for 6 months after your Watchman implantation to help protect you from infection. After 6 months, antibiotics are no  longer required. Contact a health care provider if: You have redness, mild swelling, or pain around your puncture site. You have soreness in your throat or at your puncture site that does not improve after several days You have fluid or blood coming from your puncture site that stops after applying firm pressure to the area. Your puncture site feels warm to the touch. You have pus or a bad smell coming from your puncture site. You have a fever. You have chest pain or discomfort that spreads to your neck, jaw, or arm. You are sweating a lot. You feel nauseous. You have a fast or irregular heartbeat. You have shortness of breath. You are dizzy or light-headed and feel the need to lie down. You have pain or numbness in the arm or leg closest to your puncture site. Get help right away if: Your puncture site suddenly swells. Your puncture site is bleeding and the bleeding does not stop after applying firm pressure to the area. These symptoms may represent a serious problem that is an emergency. Do not wait to see if the symptoms will go away. Get medical help right away. Call your local emergency services (911 in the [place] Do not drive yourself to the hospital. Summary After the procedure, it is normal to have bruising and tenderness at the puncture site in your groin, neck, or forearm. Check your puncture site every day for signs of infection. Get help right away if your puncture site is bleeding and the bleeding does not stop after applying firm pressure to the area. This is a medical emergency.  This information is not intended to replace advice given to you by your health care provider. Make sure you discuss any questions you have with your health care provider.

## 2024-05-15 NOTE — Interval H&P Note (Signed)
 History and Physical Interval Note:  05/15/2024 6:56 AM  Dana Sandoval  has presented today for surgery, with the diagnosis of afib.  The various methods of treatment have been discussed with the patient and family. After consideration of risks, benefits and other options for treatment, the patient has consented to  Procedure(s): LEFT ATRIAL APPENDAGE OCCLUSION (N/A) TRANSESOPHAGEAL ECHOCARDIOGRAM (N/A) as a surgical intervention.  The patient's history has been reviewed, patient examined, no change in status, stable for surgery.  I have reviewed the patient's chart and labs.  Questions were answered to the patient's satisfaction.    Reviewed risks, benefits and alternatives again with patient and family. She is agreeable to proceed. Plan for DAPT starting today x 6 months then ASA monotherapy thereafter. Same day discharge.    Donnice DELENA Primus

## 2024-05-15 NOTE — Transfer of Care (Signed)
 Immediate Anesthesia Transfer of Care Note  Patient: Emon B Panozzo  Procedure(s) Performed: LEFT ATRIAL APPENDAGE OCCLUSION TRANSESOPHAGEAL ECHOCARDIOGRAM  Patient Location: Cath Lab  Anesthesia Type:General  Level of Consciousness: awake, alert , and oriented  Airway & Oxygen Therapy: Patient Spontanous Breathing and Patient connected to nasal cannula oxygen  Post-op Assessment: Report given to RN, Post -op Vital signs reviewed and stable, Patient moving all extremities X 4, and Patient able to stick tongue midline  Post vital signs: Reviewed and stable  Last Vitals:  Vitals Value Taken Time  BP 126/68 05/15/24 08:40  Temp 97.6   Pulse 67 05/15/24 08:42  Resp 9 05/15/24 08:42  SpO2 93 % 05/15/24 08:42  Vitals shown include unfiled device data.  Last Pain:  Vitals:   05/15/24 0734  PainSc: 0-No pain      Patients Stated Pain Goal: 0 (05/15/24 9375)  Complications: There were no known notable events for this encounter.

## 2024-05-16 ENCOUNTER — Telehealth: Payer: Self-pay

## 2024-05-16 ENCOUNTER — Encounter (INDEPENDENT_AMBULATORY_CARE_PROVIDER_SITE_OTHER): Admitting: Ophthalmology

## 2024-05-16 DIAGNOSIS — I480 Paroxysmal atrial fibrillation: Secondary | ICD-10-CM

## 2024-05-16 DIAGNOSIS — Z95818 Presence of other cardiac implants and grafts: Secondary | ICD-10-CM

## 2024-05-16 MED FILL — Lidocaine HCl Local Preservative Free (PF) Inj 1%: INTRAMUSCULAR | Qty: 30 | Status: AC

## 2024-05-16 MED FILL — Fentanyl Citrate Preservative Free (PF) Inj 100 MCG/2ML: INTRAMUSCULAR | Qty: 1 | Status: AC

## 2024-05-16 NOTE — Telephone Encounter (Signed)
  [facility]   Watchman Team  Contacted the patient regarding discharge from [facility] on 05/15/2024.  The patient understands to follow up with Daphne Barrack, NP on 06/30/2024. Offered appt in [place] and patient wanted to come to [place].  The patient understands discharge instructions? Yes  The patient understands medications and regimen? Yes   The patient reports groin site is soft and not painful. No concerns.   Scheduled post procedure imaging on 07/30/24.  The patient understands to call with any questions or concerns prior to scheduled visit.   The patient was grateful for call and agreed with plan.

## 2024-05-16 NOTE — Patient Instructions (Signed)
 Visit Information  Thank you for taking time to visit with me today. Please don't hesitate to contact me if I can be of assistance to you   Patient instructions:  take medications as prescribed.   follow up with providers as recommended.   follow up with her primary care provider office to determine if hospital follow up visit is needed.  notify provider for any new/ ongoing symptoms.   seek emergency medical services for severe symptoms with breathing and/or chest pain  Follow discharge instructions regarding wound site care and activity restrictions   Patient verbalizes understanding of instructions and care plan provided today and agrees to view in MyChart. Active MyChart status and patient understanding of how to access instructions and care plan via MyChart confirmed with patient.     The patient has been provided with contact information for the care management team and has been advised to call with any health related questions or concerns.   Please call the care guide team at [phone] if you need to cancel or reschedule your appointment.   Please call the Suicide and Crisis Lifeline: 988 call the [place]  National Suicide Prevention Lifeline: 905 351 1097 or TTY: (210) 401-7679 TTY 712-220-8338) to talk to a trained counselor call 1-800-273-TALK (toll free, 24 hour hotline) if you are experiencing a Mental Health or Behavioral Health Crisis or need someone to talk to.  Arvin Seip RN, BSN, CCM [employer], Population Health Case Manager Phone: [phone]

## 2024-05-16 NOTE — Transitions of Care (Post Inpatient/ED Visit) (Signed)
 05/16/2024  Name: Dana Sandoval MRN: [id number] DOB: [birth year]  Today's TOC FU Call Status: Today's TOC FU Call Status:: Successful TOC FU Call Completed TOC FU Call Complete Date: 05/16/24 Patient's Name and Date of Birth confirmed.  Transition Care Management Follow-up Telephone Call Date of Discharge: 05/15/24 Discharge Facility: [facility]) Type of Discharge: Inpatient Admission Primary Inpatient Discharge Diagnosis:: PAF How have you been since you were released from the hospital?: Same Any questions or concerns?: No  Items Reviewed: Did you receive and understand the discharge instructions provided?: Yes Medications obtained,verified, and reconciled?: Yes (Medications Reviewed) Any new allergies since your discharge?: No Dietary orders reviewed?: Yes Type of Diet Ordered:: low salt heart healthy Do you have support at home?: Yes People in Home [RPT]: spouse Name of Support/Comfort Primary Source: [name]  Medications Reviewed Today: Medications Reviewed Today     Reviewed by Jahid Weida E, RN (Registered Nurse) on 05/16/24 at 1256  Med List Status: <None>   Medication Order Taking? Sig Documenting Provider Last Dose Status Informant  amiodarone  (PACERONE ) 200 MG tablet [id number] Yes Take 0.5 tablets (100 mg total) by mouth daily. [name], MD  Active Self  amoxicillin  (AMOXIL ) 500 MG capsule [id number] Yes Take 4 capsules (2,000 mg total) by mouth 1 hour prior to dental work including cleanings as directed. Sebastian Lamarr SAUNDERS, PA-C  Active Self  Ascorbic Acid (VITAMIN C PO) [id number] Yes Take 500 mg by mouth in the morning. [provider]  Active Self  aspirin  EC 81 MG tablet [id number] Yes Take 1 tablet (81 mg total) by mouth daily. Swallow whole. Almetta Donnice LABOR, MD  Active   atorvastatin  (LIPITOR) 40 MG tablet [id number] Yes Take 1 tablet (40 mg total) by mouth daily at 6PM. [name], MD  Active Self  Cholecalciferol 50 MCG  (2000 UT) TBDP [id number] Yes Take 50 mcg by mouth daily. [provider]  Active Self  clopidogrel  (PLAVIX ) 75 MG tablet [id number] Yes Take 1 tablet (75 mg total) by mouth daily. Almetta Donnice LABOR, MD  Active   Cyanocobalamin  (VITAMIN B-12) 1000 MCG/15ML LIQD [id number] Yes Take 1 mL by mouth 3 (three) times a week. [provider]  Active Self  Docosanol (ABREVA) 10 % CREA [id number] Yes Apply 1 application  topically as needed (fever blister). [provider]  Active Self  ferrous sulfate  (FEROSUL) 325 (65 FE) MG tablet [id number] Yes Take 1 tablet (325 mg total) by mouth 2 (two) times daily with a meal. [name], MD  Active Self  glucose blood [facility] VERIO) test strip [id number] Yes use to test blood glucose levels in the morning, at noon, and at bedtime. [name], MD  Active Self, Pharmacy Records, Child  levothyroxine  (SYNTHROID ) 88 MCG tablet [id number] Yes Take 1 tablet (88 mcg total) by mouth daily. [name], MD  Active Self  losartan -hydrochlorothiazide  (HYZAAR ) 50-12.5 MG tablet [id number] Yes Take 1 tablet by mouth every morning. Ladona Heinz, MD  Active Self  Multiple Vitamins-Minerals (PRESERVISION AREDS 2 PO) [id number] Yes Take 1 tablet by mouth 2 (two) times daily.  [provider]  Active Self  OneTouch Delica Lancets 33G MISC [id number] Yes use one in the morning, at noon, and at bedtime to check blood glucose levels [name], MD  Active Self  pantoprazole  (PROTONIX ) 40 MG tablet [id number] Yes Take 1 tablet (40 mg total) by mouth daily. [name], MD  Active Self  pyridoxine (B-6) 100  MG tablet [id number] Yes Take 100 mg by mouth daily. [provider]  Active Self  rOPINIRole  (REQUIP ) 0.25 MG tablet [id number] Yes Take 1 tablet (0.25 mg total) by mouth at bedtime. Raenelle Donalda HERO, MD  Active Self           Med Note ADOLPHUS SHU   Fri Mar 07, 2024  9:30 AM) PRN  valACYclovir  (VALTREX ) 500 MG tablet  [id number] Yes Take 1 tablet (500 mg total) by mouth daily as needed (fever blister). [name], MD  Active Self            Home Care and Equipment/Supplies: Were Home Health Services Ordered?: No Any new equipment or medical supplies ordered?: No  Functional Questionnaire: Do you need assistance with bathing/showering or dressing?: No Do you need assistance with meal preparation?: No Do you need assistance with eating?: No Do you have difficulty maintaining continence: No Do you need assistance with getting out of bed/getting out of a chair/moving?: No Do you have difficulty managing or taking your medications?: No  Follow up appointments reviewed: PCP Follow-up appointment confirmed?: No (patient states she will contact her provider office to determine if a hospital follow up visit is needed) Specialist Hospital Follow-up appointment confirmed?: Yes Date of Specialist follow-up appointment?: 06/02/24 Follow-Up Specialty Provider:: Dr. Ladona Do you need transportation to your follow-up appointment?: No Do you understand care options if your condition(s) worsen?: Yes-patient verbalized understanding  SDOH Interventions Today    Flowsheet Row Most Recent Value  SDOH Interventions   Food Insecurity Interventions Intervention Not Indicated  Housing Interventions Intervention Not Indicated  Transportation Interventions Intervention Not Indicated  Utilities Interventions Intervention Not Indicated   Discussed and offered 30 day TOC program.  Patient declined.  The patient has been provided with contact information for the care management team and has been advised to call with any health -related questions or concerns.  The patient verbalized understanding with current plan of care.  The patient is directed to their insurance card regarding availability of benefits coverage.    Arvin Seip RN, BSN, CCM [employer], Population Health Case  Manager Phone: [phone]

## 2024-05-20 ENCOUNTER — Other Ambulatory Visit: Payer: Self-pay

## 2024-05-20 ENCOUNTER — Other Ambulatory Visit: Payer: Self-pay | Admitting: Cardiology

## 2024-05-20 ENCOUNTER — Other Ambulatory Visit (HOSPITAL_COMMUNITY): Payer: Self-pay

## 2024-05-20 ENCOUNTER — Encounter: Payer: Self-pay | Admitting: Oncology

## 2024-05-20 DIAGNOSIS — I480 Paroxysmal atrial fibrillation: Secondary | ICD-10-CM

## 2024-05-20 MED ORDER — AMIODARONE HCL 200 MG PO TABS
100.0000 mg | ORAL_TABLET | Freq: Every day | ORAL | 2 refills | Status: AC
  Filled 2024-05-20: qty 45, 90d supply, fill #0
  Filled 2024-08-15: qty 45, 90d supply, fill #1

## 2024-05-23 ENCOUNTER — Other Ambulatory Visit (HOSPITAL_COMMUNITY): Payer: Self-pay

## 2024-05-23 ENCOUNTER — Inpatient Hospital Stay

## 2024-05-23 ENCOUNTER — Inpatient Hospital Stay: Attending: Oncology

## 2024-05-23 VITALS — BP 110/44 | HR 59 | Temp 97.0°F | Resp 19

## 2024-05-23 DIAGNOSIS — N1832 Chronic kidney disease, stage 3b: Secondary | ICD-10-CM | POA: Insufficient documentation

## 2024-05-23 DIAGNOSIS — D649 Anemia, unspecified: Secondary | ICD-10-CM

## 2024-05-23 DIAGNOSIS — D631 Anemia in chronic kidney disease: Secondary | ICD-10-CM | POA: Diagnosis not present

## 2024-05-23 LAB — HEMOGLOBIN AND HEMATOCRIT (CANCER CENTER ONLY)
HCT: 31.5 % — ABNORMAL LOW (ref 36.0–46.0)
Hemoglobin: 10.4 g/dL — ABNORMAL LOW (ref 12.0–15.0)

## 2024-05-23 MED ORDER — IRON SUCROSE 20 MG/ML IV SOLN
200.0000 mg | Freq: Once | INTRAVENOUS | Status: AC
  Administered 2024-05-23: 200 mg via INTRAVENOUS
  Filled 2024-05-23: qty 10

## 2024-05-23 MED ORDER — SODIUM CHLORIDE 0.9% FLUSH
10.0000 mL | Freq: Once | INTRAVENOUS | Status: AC | PRN
  Administered 2024-05-23: 10 mL
  Filled 2024-05-23: qty 10

## 2024-05-26 ENCOUNTER — Inpatient Hospital Stay: Admitting: Oncology

## 2024-05-29 ENCOUNTER — Other Ambulatory Visit (HOSPITAL_COMMUNITY): Payer: Self-pay

## 2024-05-29 ENCOUNTER — Ambulatory Visit: Admitting: Family Medicine

## 2024-05-29 ENCOUNTER — Other Ambulatory Visit: Payer: Self-pay

## 2024-05-29 ENCOUNTER — Encounter: Payer: Self-pay | Admitting: Family Medicine

## 2024-05-29 VITALS — BP 120/66 | HR 70 | Temp 97.8°F | Ht 62.0 in | Wt 171.5 lb

## 2024-05-29 DIAGNOSIS — I480 Paroxysmal atrial fibrillation: Secondary | ICD-10-CM | POA: Diagnosis not present

## 2024-05-29 DIAGNOSIS — E119 Type 2 diabetes mellitus without complications: Secondary | ICD-10-CM

## 2024-05-29 DIAGNOSIS — Z23 Encounter for immunization: Secondary | ICD-10-CM

## 2024-05-29 DIAGNOSIS — D500 Iron deficiency anemia secondary to blood loss (chronic): Secondary | ICD-10-CM

## 2024-05-29 MED ORDER — ROPINIROLE HCL 0.25 MG PO TABS
0.2500 mg | ORAL_TABLET | Freq: Every evening | ORAL | 3 refills | Status: AC | PRN
  Filled 2024-05-29: qty 90, 90d supply, fill #0

## 2024-05-29 MED ORDER — PANTOPRAZOLE SODIUM 40 MG PO TBEC
40.0000 mg | DELAYED_RELEASE_TABLET | Freq: Every day | ORAL | 3 refills | Status: AC
  Filled 2024-05-29: qty 90, 90d supply, fill #0

## 2024-05-29 MED ORDER — ROPINIROLE HCL 0.25 MG PO TABS
0.2500 mg | ORAL_TABLET | Freq: Every evening | ORAL | 3 refills | Status: DC | PRN
  Filled 2024-05-29: qty 30, 30d supply, fill #0

## 2024-05-29 NOTE — Progress Notes (Signed)
 D/w pt about watchman placement, most recent IFOB neg.  On aspirin  and plavix  for now.    Had iron  infusion.  RLS sx are better recently.  Using requip  prn, not every night.  I asked patient to let me know if her RLS symptoms get worse or if she has any obvious blood loss.  Diarrhea is clearly better. Stools are not dark now- that improved.  Still taking iron  by mouth BID at baseline.   She had colonoscopy and EGD done 2025.  D/w pt.    D/w pt about recheck A1c later, most recently done last 02/2024.  Sugar has been 97-126. Generally reasonably controlled.  No meds for DM2 currently.  D/w pt getting flu shot today.    Meds, vitals, and allergies reviewed.   ROS: Per HPI unless specifically indicated in ROS section    Nad [place] Neck supple, no LA RRR with SEM noted at baseline Ctab Abdomen soft.  Nontender. No BLE edema.  Skin well-perfused.

## 2024-05-29 NOTE — Patient Instructions (Addendum)
 Assuming your sugar is reasonable at home, then recheck at a visit in April.  Update me as needed.  Take care.  Glad to see you.

## 2024-06-01 NOTE — Assessment & Plan Note (Signed)
 History of, status post watchman placement.  On aspirin  and Plavix  for now.  Continue as is with routine cautions discussed.

## 2024-06-01 NOTE — Assessment & Plan Note (Signed)
 Assuming her sugar is reasonable at home, then recheck at a visit in April.  Update me as needed.  No change in meds today.

## 2024-06-01 NOTE — Progress Notes (Signed)
 Cardiology Office Note:  .   Date:  06/02/2024  ID:  Dana Sandoval, DOB [birth year], MRN [id number] PCP: [name], MD  [facility] Providers Cardiologist:  None Electrophysiologist:  Donnice DELENA Primus, MD   History of Present Illness: .   Dana Sandoval is a 83 y.o. Caucasian female with history of hyperlipidemia, controlled diabetes, HTN, hypothyroidism,  abdominal atherosclerosis and abdominal ectasia, TIA with visual disturbances in Mar 2023 with AF noted on Zio on anticoagulation underwent successful TAVR on 01/01/2024, due to recurrent GI bleed, felt to be high risk for anticoagulation, underwent Watchman FLX Pro implant on 05/15/2024 without periprocedural complications.  She has severe mitral annular calcification and severe mitral regurgitation. She now presents for follow-up. She is presently doing well and denies any dyspnea, leg edema, PND or orthopnea.  Main issue is back pain but she sprained her back 2 days ago.   Discussed the use of AI scribe software for clinical note transcription with the patient, who gave verbal consent to proceed.  History of Present Illness Dana Sandoval is an 83 year old female with atrial fibrillation and severe aortic stenosis who presents with back pain.  She experiences back pain described as spasms for the past few days. Her daughter attempted massage for relief. She prefers Tylenol  for pain management and mistakenly took Aleve.  She underwent a left atrial appendage closure with the Watchman device without complications. She is on baby aspirin  and Plavix  post-procedure, with some confusion about the duration of this regimen.  Severe aortic stenosis was surgically addressed, improving her shortness of breath. She has severe mitral valve regurgitation.  Her anemia has improved since stopping blood thinners, with hemoglobin levels rising from 8.7 to 10.4 over the past month. She consumes iron -rich foods and is scheduled for a  follow-up in January for a possible iron  infusion.  She notes slight weight gain due to reduced physical activity, with occasional weight loss of two to three pounds over a few days. No significant dietary changes or signs of gastrointestinal bleeding.  Cardiac Studies relevent.    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  Normal coronary arteries.   ECHOCARDIOGRAM COMPLETE 02/14/2024  1. Left ventricular ejection fraction, by estimation, is 65 to 70%. The left ventricle has normal function. The left ventricle has no regional wall motion abnormalities. There is moderate concentric left ventricular hypertrophy. Left ventricular diastolic parameters are indeterminate. 2. Right ventricular systolic function is normal. The right ventricular size is normal. There is moderately elevated pulmonary artery systolic pressure. The estimated right ventricular systolic pressure is 52.3 mmHg. 3. Left atrial size was moderately dilated. 4. The mitral valve is abnormal. Severe mitral valve regurgitation. No evidence of mitral stenosis. Severe mitral annular calcification. 5. Tricuspid valve regurgitation is mild to moderate. 6. The aortic valve has been repaired/replaced. Aortic valve regurgitation is not visualized. No aortic stenosis is present. Echo findings are consistent with normal structure and function of the aortic valve prosthesis. Aortic valve area, by VTI measures 1.37 cm. Aortic valve mean gradient measures 9.0 mmHg. Aortic valve Vmax measures 2.22 m/s. 7. The inferior vena cava is normal in size with greater than 50% respiratory variability, suggesting right atrial pressure of 3 mmHg. 8.  Compared to 01/02/2024, mitral regurgitation has worsened from moderate mitral regurgitation.  Labs   Lab Results  Component Value Date   CHOL 120 10/09/2023   HDL 33.70 (L)  10/09/2023   LDLCALC 67 10/09/2023   TRIG  94.0 10/09/2023   CHOLHDL 4 10/09/2023   No results found for: LIPOA  Recent Labs    12/28/23 0929 01/01/24 1102 01/02/24 0317 02/08/24 0754 04/25/24 1148 05/06/24 0943  NA 138   < > 138 141 136 142  K 3.5   < > 3.9 4.2 3.8 4.2  CL 102   < > 103 103 101 104  CO2 27  --  24 23 25 23   GLUCOSE 138*   < > 128* 148* 161* 142*  BUN 12   < > 13 16 14 23   CREATININE 1.14*   < > 0.99 1.11* 1.34* 1.19*  CALCIUM  8.2*  --  8.5* 8.5* 8.5* 9.1  GFRNONAA 47*  --  56*  --  39*  --    < > = values in this interval not displayed.    Lab Results  Component Value Date   ALT 34 04/25/2024   AST 39 04/25/2024   ALKPHOS 73 04/25/2024   BILITOT 1.0 04/25/2024      Latest Ref Rng & Units 05/23/2024    2:28 PM 05/06/2024    9:43 AM 04/25/2024   11:48 AM  CBC  WBC 3.4 - 10.8 x10E3/uL  8.4  7.5   Hemoglobin 12.0 - 15.0 g/dL 89.5  89.9  8.7   Hematocrit 36.0 - 46.0 % 31.5  31.6  25.9   Platelets 150 - 450 x10E3/uL  393  363    Lab Results  Component Value Date   HGBA1C 7.9 (H) 03/07/2024    Lab Results  Component Value Date   TSH 1.15 10/09/2023    ROS  Review of Systems  Cardiovascular:  Negative for chest pain, dyspnea on exertion and leg swelling.  Musculoskeletal:  Positive for back pain.   Physical Exam:   VS:  BP 131/70 (BP Location: Left Arm, Patient Position: Sitting, Cuff Size: Normal)   Pulse 62   Resp 16   Ht 5' 2 (1.575 m)   Wt 172 lb (78 kg)   SpO2 99%   BMI 31.46 kg/m    Wt Readings from Last 3 Encounters:  06/02/24 172 lb (78 kg)  05/29/24 171 lb 8 oz (77.8 kg)  05/15/24 169 lb (76.7 kg)    BP Readings from Last 3 Encounters:  06/02/24 131/70  05/29/24 120/66  05/23/24 (!) 110/44   Physical Exam Neck:     Vascular: No JVD.  Cardiovascular:     Rate and Rhythm: Normal rate and regular rhythm.     Pulses: Intact distal pulses.     Heart sounds: S1 normal and S2 normal. Murmur heard.     High-pitched blowing holosystolic murmur is present with a grade  of 3/6 at the apex.     No gallop.  Pulmonary:     Effort: Pulmonary effort is normal.     Breath sounds: Normal breath sounds.  Abdominal:     General: Bowel sounds are normal.     Palpations: Abdomen is soft.  Musculoskeletal:     Right lower leg: No edema.     Left lower leg: No edema.    EKG:    EKG Interpretation Date/Time:  Monday June 02 2024 08:56:13 EST Ventricular Rate:  60 PR Interval:  170 QRS Duration:  90 QT Interval:  474 QTC Calculation: 474 R Axis:   17  Text Interpretation: Normal sinus rhythm Normal ECG When compared with ECG of 15-May-2024 09:51, No  significant change was found Confirmed by Noely Kuhnle, Jagadeesh [phone]) on 06/02/2024 9:42:29 AM    ASSESSMENT AND PLAN: .      ICD-10-CM   1. Paroxysmal atrial fibrillation (HCC)  I48.0 EKG 12-Lead    2. 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 Severe mitral regurgitation  I34.0     4. Presence of Watchman left atrial appendage closure device Watchman FLX Pro 05/15/2024  Z95.818     5. Back muscle spasm  M62.830 methocarbamol  (ROBAXIN ) 500 MG tablet     Assessment & Plan Acute back muscle spasm Recent onset of back muscle spasm, likely due to muscle strain. No prior orthopedic evaluation. - Prescribed Robaxin  500 mg three times a day as needed for muscle spasm. - Advised use of Tylenol  for pain management. - Recommended application of Voltaren gel three to four times a day. - Cautioned against driving and advised caution to prevent falls.  Paroxysmal atrial fibrillation status post left atrial appendage closure (Watchman device) with dual antiplatelet therapy Paroxysmal atrial fibrillation managed with dual antiplatelet therapy (aspirin  and Plavix ) following Watchman device placement. No current bleeding issues. Hemoglobin levels are stable. - Continue aspirin  and Plavix  for six months unless bleeding issues or hemoglobin drop  occur. - If bleeding were to occur, then stop aspirin  and continuing Plavix  to reduce bleeding risk.  Severe nonrheumatic mitral regurgitation Severe mitral regurgitation with significant valve leakage. Medical therapy is currently being used. Surgical intervention is not pursued due to the extent of calcification and the need for open heart surgery. - Advised weight loss and careful salt intake to prevent heart failure. - Scheduled echocardiogram in six months to monitor heart function.  Iron  deficiency anemia Improving hemoglobin levels. Current hemoglobin is 10.4, up from 8.7. Dietary intake of iron -rich foods like beets is contributing to improvement. - Continue iron  supplementation. - Scheduled follow-up in January for potential iron  infusion.   Follow up: 6 months.  Signed,  Gordy Bergamo, MD, [facility] 06/02/2024, 9:42 AM [employer] [address] [place], [place] [address] Phone: [phone]. Fax:  [phone]

## 2024-06-01 NOTE — Assessment & Plan Note (Signed)
 She had colonoscopy and EGD done 2025.  D/w pt. Her restless leg symptoms got better with iron  infusion.  Continue as is for now and she can update me as needed.

## 2024-06-02 ENCOUNTER — Other Ambulatory Visit (HOSPITAL_COMMUNITY): Payer: Self-pay

## 2024-06-02 ENCOUNTER — Encounter: Payer: Self-pay | Admitting: Cardiology

## 2024-06-02 ENCOUNTER — Ambulatory Visit: Attending: Cardiology | Admitting: Cardiology

## 2024-06-02 VITALS — BP 131/70 | HR 62 | Resp 16 | Ht 62.0 in | Wt 172.0 lb

## 2024-06-02 DIAGNOSIS — M62830 Muscle spasm of back: Secondary | ICD-10-CM

## 2024-06-02 DIAGNOSIS — Z952 Presence of prosthetic heart valve: Secondary | ICD-10-CM

## 2024-06-02 DIAGNOSIS — Z95818 Presence of other cardiac implants and grafts: Secondary | ICD-10-CM

## 2024-06-02 DIAGNOSIS — I480 Paroxysmal atrial fibrillation: Secondary | ICD-10-CM | POA: Diagnosis not present

## 2024-06-02 DIAGNOSIS — I10 Essential (primary) hypertension: Secondary | ICD-10-CM

## 2024-06-02 DIAGNOSIS — I340 Nonrheumatic mitral (valve) insufficiency: Secondary | ICD-10-CM | POA: Diagnosis not present

## 2024-06-02 MED ORDER — METHOCARBAMOL 500 MG PO TABS
500.0000 mg | ORAL_TABLET | Freq: Three times a day (TID) | ORAL | 0 refills | Status: DC | PRN
  Filled 2024-06-02: qty 20, 7d supply, fill #0

## 2024-06-02 NOTE — Patient Instructions (Signed)
 Medication Instructions:  Your physician recommends that you continue on your current medications as directed. Please refer to the Current Medication list given to you today.  *If you need a refill on your cardiac medications before your next appointment, please call your pharmacy*  Lab Work: NONE ordered at this time of appointment   Testing/Procedures: NONE ordered at this time of appointment   Follow-Up: At [facility], you and your health needs are our priority.  As part of our continuing mission to provide you with exceptional heart care, our providers are all part of one team.  This team includes your primary Cardiologist (physician) and Advanced Practice Providers or APPs (Physician Assistants and Nurse Practitioners) who all work together to provide you with the care you need, when you need it.  Your next appointment:   6 month(s)  Provider:   Dr. Ladona  We recommend signing up for the patient portal called MyChart.  Sign up information is provided on this After Visit Summary.  MyChart is used to connect with patients for Virtual Visits (Telemedicine).  Patients are able to view lab/test results, encounter notes, upcoming appointments, etc.  Non-urgent messages can be sent to your provider as well.   To learn more about what you can do with MyChart, go to [email or web address].

## 2024-06-16 ENCOUNTER — Encounter: Payer: Self-pay | Admitting: Family Medicine

## 2024-06-17 ENCOUNTER — Encounter: Payer: Self-pay | Admitting: Oncology

## 2024-06-17 ENCOUNTER — Other Ambulatory Visit: Payer: Self-pay

## 2024-06-21 LAB — HM MAMMOGRAPHY

## 2024-06-25 ENCOUNTER — Ambulatory Visit: Payer: Self-pay | Admitting: Family Medicine

## 2024-06-28 NOTE — Progress Notes (Unsigned)
" °  Electrophysiology Office Note:   Date:  06/30/2024  ID:  Dana Sandoval, DOB [birth year], MRN [id number]  Primary Cardiologist: None Primary Heart Failure: None Electrophysiologist: Donnice DELENA Primus, MD      History of Present Illness:   Dana Sandoval is a 83 y.o. female with h/o AF, severe aortic stenosis s/p TAVR, TIA, HTN, abdominal atherosclerosis, migraines, CKD IIIb, IDA, GIB, hypothyroidism, macular degeneration seen today for routine electrophysiology follow-up s/p LAAO / Watchman.  Since last being seen in our clinic the patient reports doing well post procedure.  She denies bleeding issues.   She reports she noted twice yesterday while out she had to take a deep breath after sitting down post exertion.  She indicated she didn't feel bad, just took a deep breath and her daughter noticed. She denies issues with amiodarone .  She denies chest pain, palpitations, PND, orthopnea, nausea, vomiting, dizziness, syncope, edema, weight gain, or early satiety.    Review of systems complete and found to be negative unless listed in HPI.   EP Information / Studies Reviewed:    EKG is not ordered today. EKG from 06/02/24 reviewed which showed NSR 60 bpm        Arrhythmia / AAD / Pertinent EP Studies AF  Amiodarone  10/2023 >  LAAO 05/15/24     Risk Assessment/Calculations:    CHA2DS2-VASc Score = 5   This indicates a 7.2% annual risk of stroke. The patient's score is based upon: CHF History: 1 HTN History: 1 Diabetes History: 0 Stroke History: 0 Vascular Disease History: 0 Age Score: 2 Gender Score: 1             Physical Exam:   VS:  BP 118/66   Pulse 62   Ht 5' 2 (1.575 m)   Wt 172 lb (78 kg)   SpO2 96%   BMI 31.46 kg/m    Wt Readings from Last 3 Encounters:  06/30/24 172 lb (78 kg)  06/02/24 172 lb (78 kg)  05/29/24 171 lb 8 oz (77.8 kg)     GEN: pleasant, well nourished, well developed in no acute distress NECK: No JVD; No carotid bruits CARDIAC:  Regular rate and rhythm, SEM 3-4/6, rubs, gallops RESPIRATORY:  Clear to auscultation without rales, wheezing or rhonchi  ABDOMEN: Soft, non-tender, non-distended EXTREMITIES:  No edema; No deformity   ASSESSMENT AND PLAN:    Paroxysmal Atrial Fibrillation High Risk Medication Monitoring: Amiodarone  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

## 2024-06-30 ENCOUNTER — Encounter: Payer: Self-pay | Admitting: Pulmonary Disease

## 2024-06-30 ENCOUNTER — Ambulatory Visit: Attending: Pulmonary Disease | Admitting: Pulmonary Disease

## 2024-06-30 VITALS — BP 118/66 | HR 62 | Ht 62.0 in | Wt 172.0 lb

## 2024-06-30 DIAGNOSIS — Z95818 Presence of other cardiac implants and grafts: Secondary | ICD-10-CM | POA: Diagnosis not present

## 2024-06-30 DIAGNOSIS — I480 Paroxysmal atrial fibrillation: Secondary | ICD-10-CM

## 2024-06-30 DIAGNOSIS — K922 Gastrointestinal hemorrhage, unspecified: Secondary | ICD-10-CM | POA: Diagnosis not present

## 2024-06-30 NOTE — Progress Notes (Signed)
 " [facility] - Clinic Note  07/14/2024     CHIEF COMPLAINT Patient presents for Retina Follow Up  HISTORY OF PRESENT ILLNESS: Dana Sandoval is a 83 y.o. 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  This started 8 weeks ago.  Duration of 8 weeks.  Since onset it is stable.  I, the attending physician,  performed the HPI with the patient and updated documentation appropriately.        Comments   8 week retina follow up AMD and IVV PAP pt is reporting no vision changes noticed she denies any flashes has some floaters       Last edited by Valdemar Rogue, MD on 07/14/2024 12:47 PM.     Patient feels the vision is the same but noticed it was harder to read the eye chart today. She is currently sick with a cold.   Referring physician: [name], MD [address] [place],  [place] [address]  HISTORICAL INFORMATION:  Selected notes from the MEDICAL RECORD NUMBER Referred by Dr. CANDIE Gaudy for eval of ret heme OD   CURRENT MEDICATIONS: No current outpatient medications on file. (Ophthalmic Drugs)   No current facility-administered medications for this visit. (Ophthalmic Drugs)   Current Outpatient Medications (Other)  Medication Sig   amiodarone  (PACERONE ) 200 MG tablet Take 0.5 tablets (100 mg total) by mouth daily.   amoxicillin  (AMOXIL ) 500 MG capsule Take 4 capsules (2,000 mg total) by mouth 1 hour prior to dental work including cleanings as directed.   Ascorbic Acid (VITAMIN C PO) Take 500 mg by mouth in the morning.   aspirin  EC 81 MG tablet Take 1 tablet (81 mg total) by mouth daily. Swallow whole.   atorvastatin  (LIPITOR) 40 MG tablet Take 1 tablet (40 mg total) by mouth daily at [facility].   Cholecalciferol 50 MCG (2000 UT) TBDP Take 50 mcg by mouth daily.   clopidogrel  (PLAVIX ) 75 MG tablet Take 1 tablet (75 mg total) by mouth daily.   Cyanocobalamin  (VITAMIN B-12) 1000  MCG/15ML LIQD Take 1 mL by mouth 3 (three) times a week.   Docosanol (ABREVA) 10 % CREA Apply 1 application  topically as needed (fever blister).   ferrous sulfate  (FEROSUL) 325 (65 FE) MG tablet Take 1 tablet (325 mg total) by mouth 2 (two) times daily with a meal.   glucose blood (ONETOUCH VERIO) test strip use to test blood glucose levels in the morning, at noon, and at bedtime.   levothyroxine  (SYNTHROID ) 88 MCG tablet Take 1 tablet (88 mcg total) by mouth daily.   losartan -hydrochlorothiazide  (HYZAAR ) 50-12.5 MG tablet Take 1 tablet by mouth every morning.   methocarbamol  (ROBAXIN ) 500 MG tablet Take 1 tablet (500 mg total) by mouth every 8 (eight) hours as needed for muscle spasms.   Multiple Vitamins-Minerals (PRESERVISION AREDS 2 PO) Take 1 tablet by mouth 2 (two) times daily.    OneTouch Delica Lancets 33G MISC use one in the morning, at noon, and at bedtime to check blood glucose levels   pantoprazole  (PROTONIX ) 40 MG tablet Take 1 tablet (40 mg total) by mouth daily.   rOPINIRole  (REQUIP ) 0.25 MG tablet Take 1 tablet (0.25 mg total) by mouth at bedtime as needed.   valACYclovir  (VALTREX ) 500 MG tablet Take 1 tablet (500 mg total) by mouth daily as needed (fever blister).  promethazine -dextromethorphan (PROMETHAZINE -DM) 6.25-15 MG/5ML syrup Take 2.5 mLs by mouth at bedtime for 10 days.   No current facility-administered medications for this visit. (Other)   REVIEW OF SYSTEMS: ROS   Positive for: Endocrine, Cardiovascular, Eyes Negative for: Constitutional, Gastrointestinal, Neurological, Skin, Genitourinary, Musculoskeletal, HENT, Respiratory, Psychiatric, Allergic/Imm, Heme/Lymph Last edited by Resa Delon ORN, COT on 07/14/2024  8:31 AM.       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 Wonda and Dr. Shyrl   Severe aortic stenosis    Thyroid  disease    HYPOTHYROIDISM   Past Surgical History:  Procedure Laterality Date   ABDOMINAL HYSTERECTOMY  1977   TAH  (FIBROIDS)   CATARACT EXTRACTION Bilateral    COLONOSCOPY N/A 12/12/2023   Procedure: COLONOSCOPY;  Surgeon: Kristie Lamprey, MD;  Location: [facility] ENDOSCOPY;  Service: Gastroenterology;  Laterality: N/A;   DILATION AND CURETTAGE OF UTERUS     DOPPLER ECHOCARDIOGRAPHY  2009   HAD AN ECHOCARDIOGRAM THAT SHOWED MODERATE MITRAL  ANNULAR CALCIFICATION AND A NORMAL EJECTION FRACTION OF 55%   ESOPHAGOGASTRODUODENOSCOPY N/A 12/12/2023   Procedure: EGD (ESOPHAGOGASTRODUODENOSCOPY);  Surgeon: Kristie Lamprey, MD;  Location: [facility] ENDOSCOPY;  Service: Gastroenterology;  Laterality: N/A;   GIVENS CAPSULE STUDY N/A 12/12/2023   Procedure: IMAGING PROCEDURE, GI TRACT, INTRALUMINAL, VIA CAPSULE;  Surgeon: Kristie Lamprey, MD;  Location: [facility] ENDOSCOPY;  Service: Gastroenterology;  Laterality: N/A;   INTRAOPERATIVE TRANSTHORACIC ECHOCARDIOGRAM N/A 01/01/2024   Procedure: ECHOCARDIOGRAM, TRANSTHORACIC;  Surgeon: Wonda Sharper, MD;  Location: [facility] INVASIVE CV LAB;  Service: Cardiovascular;  Laterality: N/A;   LEFT ATRIAL APPENDAGE OCCLUSION N/A 05/15/2024   Procedure: LEFT ATRIAL APPENDAGE OCCLUSION;  Surgeon: Almetta Donnice LABOR, MD;  Location: [facility] INVASIVE CV LAB;  Service: Cardiovascular;  Laterality: N/A;   RIGHT HEART CATH AND CORONARY ANGIOGRAPHY N/A 10/24/2023   Procedure: RIGHT HEART CATH AND CORONARY ANGIOGRAPHY;  Surgeon: Ladona Heinz, MD;  Location: [facility] INVASIVE CV LAB;  Service: Cardiovascular;  Laterality: N/A;   SHOULDER SURGERY     RIGHT  SHOULDER SURGERY   THYROIDECTOMY     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   RIGHT   TOTAL KNEE ARTHROPLASTY Left 09/10/2018   Procedure: TOTAL KNEE ARTHROPLASTY;  Surgeon: Sheril Coy, MD;  Location: [facility] ORS;  Service: Orthopedics;  Laterality: Left;   TRANSESOPHAGEAL ECHOCARDIOGRAM (CATH LAB) N/A 05/15/2024   Procedure: TRANSESOPHAGEAL ECHOCARDIOGRAM;  Surgeon: Almetta Donnice LABOR, MD;  Location: [facility] INVASIVE CV LAB;  Service: Cardiovascular;  Laterality: N/A;   FAMILY HISTORY Family History  Problem Relation Age of Onset   Osteoporosis Mother    Heart disease Mother    Macular degeneration Mother    Heart disease Father    Cancer Father        BRAIN TUMOR   Cancer Brother        BRAIN TUMOR   Diabetes Maternal Aunt    Diabetes Maternal Uncle  Stroke Paternal Grandfather    Colon cancer Neg Hx    Breast cancer Neg Hx    Migraines Neg Hx    SOCIAL HISTORY Social History   Tobacco Use   Smoking status: Never   Smokeless tobacco: Never  Vaping Use   Vaping status: Never Used  Substance Use Topics   Alcohol use: No    Alcohol/week: 0.0 standard drinks of alcohol   Drug use: No       OPHTHALMIC EXAM: Base Eye Exam     Visual Acuity (Snellen - Linear)       Right Left   Dist [place] 20/50 -1 20/40 -3   Dist ph [place] NI NI         Tonometry (Tonopen, 8:36 AM)       Right Left   Pressure 14 17         Pupils       Pupils Dark Light Shape React APD   Right PERRL 3 2 Round Brisk None   Left PERRL 3 2 Round Brisk None         Visual Fields       Left Right    Full Full         Extraocular Movement       Right Left    Full, Ortho Full, Ortho         Neuro/Psych     Oriented x3: Yes   Mood/Affect: Normal         Dilation     Both eyes: 2.5% Phenylephrine  @ 8:36 AM           Slit Lamp and Fundus Exam     Slit Lamp Exam       Right Left   Lids/Lashes Dermatochalasis - upper lid Dermatochalasis - upper lid   Conjunctiva/Sclera  White and quiet White and quiet   Cornea 1+ Punctate epithelial erosions, fine endo pigment, trace tear film debris 1+ fine Punctate epithelial erosions, fine endo pigment, trace tear film debris   Anterior Chamber Deep and quiet Deep and quiet   Iris Round and dilated Round and dilated   Lens Toric PC IOL with marks at 1200 and 0600, 1+ Posterior capsular opacification nasal side approaching visual axis Toric PC IOL with marks at 1200 and 0600, 1+ Posterior capsular opacification   Anterior Vitreous Vitreous syneresis, trace pigment, Posterior vitreous detachment, vitreous condensations Vitreous syneresis, Posterior vitreous detachment, vitreous condensations         Fundus Exam       Right Left   Disc mild Pallor, Sharp rim, +cupping, PPA mild Pallor, Sharp rim, +cupping, PPA   C/D Ratio 0.7 0.7   Macula Blunted foveal reflex, central CNV with subretinal heme -- stably resolved -- now with pigment clumping, RPE mottling, clumping and atrophy, Drusen, stable improvement in shallow SRF overlying CNV, trace cystic changes Flat, Blunted foveal reflex, Drusen, RPE mottling, clumping and atrophy, focal IRH/SRH inferior to fovea -- stably improved, scattered patches of GA, no heme or fluid   Vessels attenuated, Tortuous attenuated, mild tortuosity   Periphery Attached, reticular degeneration, paving stone degeneration IT quad, No heme Attached, reticular degeneration, mild inferior paving stone degeneration, No heme           IMAGING AND PROCEDURES  Imaging and Procedures for 07/14/2024  OCT, Retina - OU - Both Eyes       Right Eye Quality was good. Central Foveal Thickness: 336. Progression has worsened. Findings include no  SRF, abnormal foveal contour, retinal drusen , subretinal hyper-reflective material, intraretinal fluid, pigment epithelial detachment, outer retinal atrophy (persistent PED, trace IRF / cystic changes--slightly increased, and stable improvement in SRF overlying PED,  +ORA).   Left Eye Quality was good. Central Foveal Thickness: 214. Progression has been stable. Findings include normal foveal contour, no IRF, no SRF, retinal drusen , outer retinal atrophy (Stable improvement in focal central SRHM ).   Notes *Images captured and stored on drive  Diagnosis / Impression:  Ex ARMD OU OD: persistent PED, trace IRF / cystic changes--slightly increased, and stable improvement in SRF overlying PED, +ORA OS: Stable improvement in focal central [facility]   Clinical management:  See below  Abbreviations: NFP - Normal foveal profile. CME - cystoid macular edema. PED - pigment epithelial detachment. IRF - intraretinal fluid. SRF - subretinal fluid. EZ - ellipsoid zone. ERM - epiretinal membrane. ORA - outer retinal atrophy. ORT - outer retinal tubulation. SRHM - subretinal hyper-reflective material. IRHM - intraretinal hyper-reflective material      Intravitreal Injection, Pharmacologic Agent - OD - Right Eye       Time Out 07/14/2024. 9:01 AM. Confirmed correct patient, procedure, site, and patient consented.   Anesthesia Topical anesthesia was used. Anesthetic medications included Lidocaine  2%, Proparacaine 0.5%.   Procedure Preparation included 5% betadine to ocular surface, eyelid speculum. A supplied (32g) needle was used.   Injection: 6 mg faricimab -svoa 6 MG/0.05ML (Patient supplied)   Route: Intravitreal, Site: Right Eye   NDC: 49757-903-93, Lot: [id number], Expiration date: 04/08/2025, Waste: 0 mL   Post-op Post injection exam found visual acuity of at least counting fingers. The patient tolerated the procedure well. There were no complications. The patient received written and verbal post procedure care education. Post injection medications were not given.   Notes **PAP MEDICATION ADMINISTERED**      Intravitreal Injection, Pharmacologic Agent - OS - Left Eye       Time Out 07/14/2024. 9:01 AM. Confirmed correct patient, procedure, site, and  patient consented.   Anesthesia Topical anesthesia was used. Anesthetic medications included Lidocaine  2%, Proparacaine 0.5%.   Procedure Preparation included 5% betadine to ocular surface, eyelid speculum. A supplied (33g) needle was used.   Injection: 6 mg faricimab -svoa 6 MG/0.05ML (Patient supplied)   Route: Intravitreal, Site: Left Eye   NDC: 49757-903-93, Lot: [id number], Expiration date: 04/08/2025, Waste: 0 mL   Post-op Post injection exam found visual acuity of at least counting fingers. The patient tolerated the procedure well. There were no complications. The patient received written and verbal post procedure care education.   Notes **PAP MEDICATION ADMINISTERED**       CBC with Differential             Component Ref Range & Units Value Flag   WBC   4.0 - 10.5 K/uL 7.8     RBC   3.87 - 5.11 Mil/uL 3.85  (L)     Hemoglobin   12.0 - 15.0 g/dL 88.0  (L)     HCT   63.9 - 46.0 % 34.4  (L)     MCV   78.0 - 100.0 fl 89.4     MCHC   30.0 - 36.0 g/dL 65.3     RDW   88.4 - 84.4 % 13.9     Platelets   150.0 - 400.0 K/uL 303.0     Neutrophils Relative %   43.0 - 77.0 % 65.6     Lymphocytes Relative   12.0 -  46.0 % 13.2     Monocytes Relative   3.0 - 12.0 % 16.4  (H)     Eosinophils Relative   0.0 - 5.0 % 3.3     Basophils Relative   0.0 - 3.0 % 1.5     Neutro Abs   1.4 - 7.7 K/uL 5.1     Lymphs Abs   0.7 - 4.0 K/uL 1.0     Monocytes Absolute   0.1 - 1.0 K/uL 1.3  (H)     Eosinophils Absolute   0.0 - 0.7 K/uL 0.3     Basophils Absolute   0.0 - 0.1 K/uL 0.1                ASSESSMENT/PLAN:   ICD-10-CM   1. Exudative age-related macular degeneration of right eye with active choroidal neovascularization (HCC)  H35.3211 OCT, Retina - OU - Both Eyes    Intravitreal Injection, Pharmacologic Agent - OD - Right Eye    faricimab -svoa (VABYSMO ) 6mg /0.63mL intravitreal injection    2. Exudative age-related macular degeneration of left eye with active  choroidal neovascularization (HCC)  H35.3221 Intravitreal Injection, Pharmacologic Agent - OS - Left Eye    faricimab -svoa (VABYSMO ) 6mg /0.84mL intravitreal injection    3. Essential hypertension  I10     4. Hypertensive retinopathy of both eyes  H35.033     5. Pseudophakia, both eyes  Z96.1     6. PCO (posterior capsule opacification), bilateral  H26.493      1. Exudative age related macular degeneration, OD - s/p IVA OD #1 (05.06.22), #2 (06.03.22), #3 (07.01.22), #4 (07.29.22), #5 (08.26.22), #6 (09.23.22), #7 (10.21.22), #8 (11.18.22), #9 (12.16.22), #10 (01.20.23)  -- IVA resistance ===================================== - s/p IVE OD #1 (02.24.23) -- sample, #2 (03.24.23), #3 (04.21.23), #4 (05.19.23), #5 (06.16.23), #6 (07.21.23), #7 (09.01.23), #8 (10.27.23), #9 (12.15.23), #10 (02.02.24), #11 (03.29.24), #12 (06.06.24) #13 (07.26.24), #14 (09.13.24), #15 (10.31.24), #16 (12.20.24) -- IVE resistance ====================================== - s/p IVV OD #1 (02.07.25, sample), #2 (03.21.25, sample), #3 (05.02.25, sample), #4 (06.16.25, sample), #6 (08.01.25 sample), #7 (PAP--09.19.25),  #8 (PAP-11.03.25)  **h/o increased IRF at 9 wks on 01.05.26 - OCT shows OD: persistent PED, trace IRF / cystic changes--slightly increased, and stable improvement in SRF overlying PED, +ORA at 9 weeks  - BCVA OD 20/50 from 20/40 - recommend IVV today OD #9 (PAP-07/14/2024) w/ f/u back to 8 wks  - RBA of procedure discussed, questions answered - see procedure note - IVV informed consent obtained and signed, 02.07.25 (OD)  - f/u 8 wks -- DFE/OCT, possible injection  2. Exudative age related macular degeneration, left eye   - early conversion to exudative ARMD -- noted on 01.04.24 - s/p IVA OS #1 (01.04.24), #2 (02.02.24), #3 (03.01.24), #4 (03.29.24), #5 (05.03.24), #6 (06.06.24), #7 (07.26.24), #8 (09.13.24), #9 (10.31.24), #10 (12.20.24), #11 (02.07.25), #12 (06.16.25) #13  (08.01.25) ============== -s/p IVV OS #1 (PAP--09.19.25), #2 (PAP-11.03.25)  - BCVA OS 20/30 stable - OCT shows OS: Stable improvement in focal central SRHM at 9 weeks - recommend IVV today OS #3 (PAP-07/14/24) with f/u in 8 weeks - pt wishes to proceed with injection - RBA of procedure discussed, questions answered - informed consent obtained and signed - see procedure note - IVV informed consent obtained and signed, 09.19.25 (OS)  - f/u in 8 wks, DFE, OCT, possible injection  3,4. Hypertensive retinopathy OU - discussed importance of tight BP control - monitor   5. Pseudophakia OU  - s/p CE/IOL  -  IOL in good position, doing well  - monitor   6. PCO OU (OD > OS)  - continue to monitor   Ophthalmic Meds Ordered this visit:  Meds ordered this encounter  Medications   faricimab -svoa (VABYSMO ) 6mg /0.6mL intravitreal injection   faricimab -svoa (VABYSMO ) 6mg /0.42mL intravitreal injection     Return in about 8 weeks (around 09/08/2024) for f/u, Ex. AMD, DFE, OCT, Possible, PAP medication, IVV.  There are no Patient Instructions on file for this visit.  This document serves as a record of services personally performed by Redell JUDITHANN Hans, MD, PhD. It was created on their behalf by Avelina Pereyra, COA an ophthalmic technician. The creation of this record is the provider's dictation and/or activities during the visit.   Electronically signed by: Avelina GORMAN Pereyra, COT  07/17/2024  4:56 PM   This document serves as a record of services personally performed by Redell JUDITHANN Hans, MD, PhD. It was created on their behalf by Wanda GEANNIE Keens, COT an ophthalmic technician. The creation of this record is the provider's dictation and/or activities during the visit.    Electronically signed by:  Wanda GEANNIE Keens, COT  07/17/2024 4:56 PM  Redell JUDITHANN Hans, M.D., Ph.D. Diseases & Surgery of the Retina and Vitreous [facility]  I have reviewed the above documentation for  accuracy and completeness, and I agree with the above. Redell JUDITHANN Hans, M.D., Ph.D. 07/17/2024 5:02 PM   Abbreviations: M myopia (nearsighted); A astigmatism; H hyperopia (farsighted); P presbyopia; Mrx spectacle prescription;  CTL contact lenses; OD right eye; OS left eye; OU both eyes  XT exotropia; ET esotropia; PEK punctate epithelial keratitis; PEE punctate epithelial erosions; DES dry eye syndrome; MGD meibomian gland dysfunction; ATs artificial tears; PFAT's preservative free artificial tears; NSC nuclear sclerotic cataract; PSC posterior subcapsular cataract; ERM epi-retinal membrane; PVD posterior vitreous detachment; RD retinal detachment; DM diabetes mellitus; DR diabetic retinopathy; NPDR non-proliferative diabetic retinopathy; PDR proliferative diabetic retinopathy; CSME clinically significant macular edema; DME diabetic macular edema; dbh dot blot hemorrhages; CWS cotton wool spot; POAG primary open angle glaucoma; C/D cup-to-disc ratio; HVF humphrey visual field; GVF goldmann visual field; OCT optical coherence tomography; IOP intraocular pressure; BRVO Branch retinal vein occlusion; CRVO central retinal vein occlusion; CRAO central retinal artery occlusion; BRAO branch retinal artery occlusion; RT retinal tear; SB scleral buckle; PPV pars plana vitrectomy; VH Vitreous hemorrhage; PRP panretinal laser photocoagulation; IVK intravitreal kenalog; VMT vitreomacular traction; MH Macular hole;  NVD neovascularization of the disc; NVE neovascularization elsewhere; AREDS age related eye disease study; ARMD age related macular degeneration; POAG primary open angle glaucoma; EBMD epithelial/anterior basement membrane dystrophy; ACIOL anterior chamber intraocular lens; IOL intraocular lens; PCIOL posterior chamber intraocular lens; Phaco/IOL phacoemulsification with intraocular lens placement; PRK photorefractive keratectomy; LASIK laser assisted in situ keratomileusis; HTN hypertension; DM diabetes mellitus;  COPD chronic obstructive pulmonary disease "

## 2024-06-30 NOTE — Patient Instructions (Signed)
 Medication Instructions:  None  *If you need a refill on your cardiac medications before your next appointment, please call your pharmacy*  Lab Work: BMP today for pre-CT screening  If you have labs (blood work) drawn today and your tests are completely normal, you will receive your results only by: MyChart Message (if you have MyChart) OR A paper copy in the mail If you have any lab test that is abnormal or we need to change your treatment, we will call you to review the results.  Testing/Procedures: Pending CT scan post [place]  Follow-Up: At [facility], you and your health needs are our priority.  As part of our continuing mission to provide you with exceptional heart care, our providers are all part of one team.  This team includes your primary Cardiologist (physician) and Advanced Practice Providers or APPs (Physician Assistants and Nurse Practitioners) who all work together to provide you with the care you need, when you need it.  Your next appointment:   Follow up on 11/13/23 or as close to it as possible  Provider:   You will see one of the following Advanced Practice Providers on your designated Care Team:    Daphne Barrack, NP   Then, Donnice DELENA Primus, MD will plan to see you again.    We recommend signing up for the patient portal called MyChart.  Sign up information is provided on this After Visit Summary.  MyChart is used to connect with patients for Virtual Visits (Telemedicine).  Patients are able to view lab/test results, encounter notes, upcoming appointments, etc.  Non-urgent messages can be sent to your provider as well.   To learn more about what you can do with MyChart, go to [email or web address].   Other Instructions

## 2024-07-01 ENCOUNTER — Ambulatory Visit: Payer: Self-pay | Admitting: Pulmonary Disease

## 2024-07-01 LAB — BASIC METABOLIC PANEL WITH GFR
BUN/Creatinine Ratio: 13 (ref 12–28)
BUN: 17 mg/dL (ref 8–27)
CO2: 24 mmol/L (ref 20–29)
Calcium: 8.7 mg/dL (ref 8.7–10.3)
Chloride: 102 mmol/L (ref 96–106)
Creatinine, Ser: 1.27 mg/dL — ABNORMAL HIGH (ref 0.57–1.00)
Glucose: 124 mg/dL — ABNORMAL HIGH (ref 70–99)
Potassium: 4.1 mmol/L (ref 3.5–5.2)
Sodium: 142 mmol/L (ref 134–144)
eGFR: 41 mL/min/1.73 — ABNORMAL LOW

## 2024-07-04 ENCOUNTER — Other Ambulatory Visit (HOSPITAL_COMMUNITY): Payer: Self-pay

## 2024-07-06 ENCOUNTER — Other Ambulatory Visit (HOSPITAL_COMMUNITY): Payer: Self-pay

## 2024-07-06 ENCOUNTER — Encounter: Payer: Self-pay | Admitting: Oncology

## 2024-07-07 ENCOUNTER — Other Ambulatory Visit: Payer: Self-pay | Admitting: Cardiology

## 2024-07-07 DIAGNOSIS — M62830 Muscle spasm of back: Secondary | ICD-10-CM

## 2024-07-08 NOTE — Telephone Encounter (Signed)
 Pt of Dr. Ladona. Does Dr. Ladona want to refill? Please advise.

## 2024-07-11 ENCOUNTER — Encounter: Payer: Self-pay | Admitting: Oncology

## 2024-07-14 ENCOUNTER — Ambulatory Visit: Payer: Self-pay

## 2024-07-14 ENCOUNTER — Encounter: Payer: Self-pay | Admitting: Oncology

## 2024-07-14 ENCOUNTER — Ambulatory Visit (INDEPENDENT_AMBULATORY_CARE_PROVIDER_SITE_OTHER): Admitting: Ophthalmology

## 2024-07-14 ENCOUNTER — Ambulatory Visit (INDEPENDENT_AMBULATORY_CARE_PROVIDER_SITE_OTHER)
Admission: RE | Admit: 2024-07-14 | Discharge: 2024-07-14 | Disposition: A | Discharge status: Home / Self Care | Source: Ambulatory Visit

## 2024-07-14 ENCOUNTER — Encounter (INDEPENDENT_AMBULATORY_CARE_PROVIDER_SITE_OTHER): Payer: Self-pay | Admitting: Ophthalmology

## 2024-07-14 ENCOUNTER — Ambulatory Visit (INDEPENDENT_AMBULATORY_CARE_PROVIDER_SITE_OTHER)

## 2024-07-14 VITALS — BP 118/60 | HR 77 | Temp 98.6°F | Ht 62.0 in | Wt 173.0 lb

## 2024-07-14 DIAGNOSIS — H353231 Exudative age-related macular degeneration, bilateral, with active choroidal neovascularization: Secondary | ICD-10-CM | POA: Diagnosis not present

## 2024-07-14 DIAGNOSIS — F339 Major depressive disorder, recurrent, unspecified: Secondary | ICD-10-CM | POA: Insufficient documentation

## 2024-07-14 DIAGNOSIS — H26493 Other secondary cataract, bilateral: Secondary | ICD-10-CM

## 2024-07-14 DIAGNOSIS — R195 Other fecal abnormalities: Secondary | ICD-10-CM | POA: Diagnosis not present

## 2024-07-14 DIAGNOSIS — H35033 Hypertensive retinopathy, bilateral: Secondary | ICD-10-CM | POA: Diagnosis not present

## 2024-07-14 DIAGNOSIS — I10 Essential (primary) hypertension: Secondary | ICD-10-CM | POA: Diagnosis not present

## 2024-07-14 DIAGNOSIS — R051 Acute cough: Secondary | ICD-10-CM

## 2024-07-14 DIAGNOSIS — H353211 Exudative age-related macular degeneration, right eye, with active choroidal neovascularization: Secondary | ICD-10-CM

## 2024-07-14 DIAGNOSIS — Z961 Presence of intraocular lens: Secondary | ICD-10-CM

## 2024-07-14 DIAGNOSIS — H353221 Exudative age-related macular degeneration, left eye, with active choroidal neovascularization: Secondary | ICD-10-CM

## 2024-07-14 MED ORDER — FARICIMAB-SVOA 6 MG/0.05ML IZ SOSY
6.0000 mg | PREFILLED_SYRINGE | INTRAVITREAL | Status: AC | PRN
  Administered 2024-07-14: 6 mg via INTRAVITREAL

## 2024-07-14 MED ORDER — PROMETHAZINE-DM 6.25-15 MG/5ML PO SYRP: 2.5000 mL | ORAL_SOLUTION | Freq: Every evening | ORAL | 0 refills | Status: DC

## 2024-07-14 NOTE — Telephone Encounter (Signed)
 Message from Harlene ORN sent at 07/14/2024  8:16 AM EST  Reason for Triage: had a 'bug' for 4-5 days. Had 2 heart surgeries this year. Coughing up phlegm, no fever, diarrhea, loss of appetite. Would like to be seen this afternoon, if she can.

## 2024-07-14 NOTE — Progress Notes (Signed)
 "  Subjective:   This visit was conducted in person. The patient gave informed consent to the use of Abridge AI technology to record the contents of the encounter as documented below.   Patient ID: Dana Sandoval, female    DOB: 03/23/1941, 84 y.o.   MRN: [id number]   Discussed the use of AI scribe software for clinical note transcription with the patient, who gave verbal consent to proceed.  History of Present Illness Dana Sandoval is an 84 year old female with heart valve replacement and AFib who presents with a persistent cough and changes in stool color.  She has experienced a persistent cough for six days, which is not worsening but causes her to sleep in a recliner due to increased coughing when lying flat. No runny nose, sore throat, fever, body aches, or headaches. She experiences some shortness of breath, particularly when walking short distances. She has been using Mucinex and Delsym, with Mucinex providing some relief by helping to clear phlegm, which is clear in color. No leg swelling and does not feel like she cannot breathe when lying flat, but the cough persists.  She has a history of heart valve replacement in June 2025 and AFib, with a Watchman device placed in November 2025. She has a CT scan scheduled for July 30, 2024. She has also received iron  infusions for low hemoglobin and takes iron  pills twice daily.  Regarding her bowel movements, she reports that since June 2025, her bowel movements have improved, occurring once or twice daily without noticeable blood. However, she experienced two episodes of loose, dark stools over the weekend. She takes iron  supplements twice daily. Her bowel movements returned to normal by the day of the visit. No abdominal pain, nausea, vomiting, heartburn, or acid reflux. She has a history of low hemoglobin. She has not been diagnosed with ulcers.  She is currently taking pantoprazole , although she is unsure of the reason, and iron   supplements twice daily. She has had iron  infusions, with the last one in November 2025, and is scheduled for another evaluation in January 2026.   Review of Systems  All other systems reviewed and are negative.       Allergies[1]  Medications Ordered Prior to Encounter[2]  BP 118/60   Pulse 77   Temp 98.6 F (37 C) (Oral)   Ht 5' 2 (1.575 m)   Wt 173 lb (78.5 kg)   SpO2 98%   BMI 31.64 kg/m   Objective:      Physical Exam GENERAL: Alert, cooperative, well developed, no acute distress. HEAD: Normocephalic atraumatic. EYES: Extraocular movements intact BL, pupils round, equal and reactive to light BL, conjunctivae normal BL. CARDIOVASCULAR: Normal heart rate and rhythm, S1 and S2 normal without murmurs. CHEST: Clear to auscultation bilaterally, no wheezes, rhonchi, or crackles. EXTREMITIES: No cyanosis or edema. NEUROLOGICAL: Oriented to person, place and time, no gait abnormalities, moves all extremities without gross motor or sensory deficit.         Assessment & Plan:   Assessment & Plan Acute cough Cough for six days, worsens when supine, with mild dyspnea. No other URI or respiratory sx. Likely viral, possibly viral bronchitis. Will r/o bacterial pneumonia.  - Ordered chest x-ray to rule out bacterial pneumonia. - Prescribed promethazine  syrup for nighttime cough relief. - Continue Mucinex twice daily for congestion. - Follow up in 7-10 days if no improvement.  Dark stools Two brief episodes of Dark stools this past weekend which have since resolved, likely  due to high-dose iron  supplementation, she is on it twice daily. No other GI sx at this time, no weight loss. Pt has reported hx of gastric ulcer which resulted in chronic blood loss per chart review, however, Recent endoscopy and colonoscopy 6 mo negative for abnormal findings.  Hemoglobin was 10.4 a month ago. She has no abdominal pain at this time, will hold off on aggressive workup and simply check CBC,  if drastic drop in Hb, would recommend more aggressive workup.  - Ordered CBC - Follow up in 10 days to review stool color and hemoglobin level - Consider reducing iron  dosage if hemoglobin is WNL and dark stools persist.   Return in about 10 days (around 07/24/2024) for Cough and dark stools with PCP.   Jermale Crass K Cordarrius Coad, MD  07/14/2024     Contains text generated by Abridge.        [1]  Allergies Allergen Reactions   Metformin  And Related     diarrhea   Codeine Itching    After 3 days   Dilaudid [Hydromorphone Hcl] Itching    After 3 days  [2]  Current Outpatient Medications on File Prior to Visit  Medication Sig Dispense Refill   amiodarone  (PACERONE ) 200 MG tablet Take 0.5 tablets (100 mg total) by mouth daily. 45 tablet 2   amoxicillin  (AMOXIL ) 500 MG capsule Take 4 capsules (2,000 mg total) by mouth 1 hour prior to dental work including cleanings as directed. 12 capsule 6   Ascorbic Acid (VITAMIN C PO) Take 500 mg by mouth in the morning.     aspirin  EC 81 MG tablet Take 1 tablet (81 mg total) by mouth daily. Swallow whole. 90 tablet 3   atorvastatin  (LIPITOR) 40 MG tablet Take 1 tablet (40 mg total) by mouth daily at 6PM. 90 tablet 3   Cholecalciferol 50 MCG (2000 UT) TBDP Take 50 mcg by mouth daily.     clopidogrel  (PLAVIX ) 75 MG tablet Take 1 tablet (75 mg total) by mouth daily. 90 tablet 1   Cyanocobalamin  (VITAMIN B-12) 1000 MCG/15ML LIQD Take 1 mL by mouth 3 (three) times a week.     Docosanol (ABREVA) 10 % CREA Apply 1 application  topically as needed (fever blister).     ferrous sulfate  (FEROSUL) 325 (65 FE) MG tablet Take 1 tablet (325 mg total) by mouth 2 (two) times daily with a meal. 60 tablet 3   glucose blood (ONETOUCH VERIO) test strip use to test blood glucose levels in the morning, at noon, and at bedtime. 300 each 3   levothyroxine  (SYNTHROID ) 88 MCG tablet Take 1 tablet (88 mcg total) by mouth daily. 90 tablet 3   losartan -hydrochlorothiazide  (HYZAAR )  50-12.5 MG tablet Take 1 tablet by mouth every morning. 90 tablet 2   methocarbamol  (ROBAXIN ) 500 MG tablet Take 1 tablet (500 mg total) by mouth every 8 (eight) hours as needed for muscle spasms. 20 tablet 0   Multiple Vitamins-Minerals (PRESERVISION AREDS 2 PO) Take 1 tablet by mouth 2 (two) times daily.      OneTouch Delica Lancets 33G MISC use one in the morning, at noon, and at bedtime to check blood glucose levels 300 each 3   pantoprazole  (PROTONIX ) 40 MG tablet Take 1 tablet (40 mg total) by mouth daily. 90 tablet 3   rOPINIRole  (REQUIP ) 0.25 MG tablet Take 1 tablet (0.25 mg total) by mouth at bedtime as needed. 90 tablet 3   valACYclovir  (VALTREX ) 500 MG tablet Take 1 tablet (500  mg total) by mouth daily as needed (fever blister). 30 tablet 2   No current facility-administered medications on file prior to visit.   "

## 2024-07-14 NOTE — Telephone Encounter (Signed)
 FYI Only or Action Required?: FYI only for provider: appointment scheduled on 1/5. Offered appt today at 1040 and 2pm, pt declined, asked for 320 appt  Patient was last seen in primary care on 05/29/2024 by [name], MD.  Called Nurse Triage reporting Cough.  Symptoms began several days ago.  Interventions attempted: Rest, hydration, or home remedies.  Symptoms are: gradually worsening.  Triage Disposition: See HCP Within 4 Hours (Or PCP Triage)  Patient/caregiver understands and will follow disposition?: Yes, will follow disposition  Reason for Disposition  [1] MILD difficulty breathing (e.g., minimal/no SOB at rest, SOB with walking, pulse < 100) AND [2] still present when not coughing  Answer Assessment - Initial Assessment Questions 1. ONSET: When did the cough begin?      4 days 2. SEVERITY: How bad is the cough today?      moderate 3. SPUTUM: Describe the color of your sputum (e.g., none, dry cough; clear, white, yellow, green)     Clear yesterday 4. HEMOPTYSIS: Are you coughing up any blood? If Yes, ask: How much? (e.g., flecks, streaks, tablespoons, etc.)     denies 5. DIFFICULTY BREATHING: Are you having difficulty breathing? If Yes, ask: How bad is it? (e.g., mild, moderate, severe)      mild 6. FEVER: Do you have a fever? If Yes, ask: What is your temperature, how was it measured, and when did it start?     denies 7. CARDIAC HISTORY: Do you have any history of heart disease? (e.g., heart attack, congestive heart failure)      TAVR, Watchmen 10. OTHER SYMPTOMS: Do you have any other symptoms? (e.g., runny nose, wheezing, chest pain)       Diarrhea-yesterday  Protocols used: Cough - Acute Productive-A-AH

## 2024-07-14 NOTE — Telephone Encounter (Signed)
 Noted. Thanks.

## 2024-07-14 NOTE — Patient Instructions (Addendum)
 Thank you for visiting  Healthcare today! Here's what we talked about: - START Promethazine  syrup at night only, continue Mucinex twice daily.

## 2024-07-15 LAB — CBC WITH DIFFERENTIAL/PLATELET
Basophils Absolute: 0.1 K/uL (ref 0.0–0.1)
Basophils Relative: 1.5 % (ref 0.0–3.0)
Eosinophils Absolute: 0.3 K/uL (ref 0.0–0.7)
Eosinophils Relative: 3.3 % (ref 0.0–5.0)
HCT: 34.4 % — ABNORMAL LOW (ref 36.0–46.0)
Hemoglobin: 11.9 g/dL — ABNORMAL LOW (ref 12.0–15.0)
Lymphocytes Relative: 13.2 % (ref 12.0–46.0)
Lymphs Abs: 1 K/uL (ref 0.7–4.0)
MCHC: 34.6 g/dL (ref 30.0–36.0)
MCV: 89.4 fl (ref 78.0–100.0)
Monocytes Absolute: 1.3 K/uL — ABNORMAL HIGH (ref 0.1–1.0)
Monocytes Relative: 16.4 % — ABNORMAL HIGH (ref 3.0–12.0)
Neutro Abs: 5.1 K/uL (ref 1.4–7.7)
Neutrophils Relative %: 65.6 % (ref 43.0–77.0)
Platelets: 303 K/uL (ref 150.0–400.0)
RBC: 3.85 Mil/uL — ABNORMAL LOW (ref 3.87–5.11)
RDW: 13.9 % (ref 11.5–15.5)
WBC: 7.8 K/uL (ref 4.0–10.5)

## 2024-07-15 NOTE — Telephone Encounter (Signed)
 Can you see if this is appropriate.

## 2024-07-17 ENCOUNTER — Other Ambulatory Visit: Payer: Self-pay | Admitting: Family Medicine

## 2024-07-17 ENCOUNTER — Other Ambulatory Visit (HOSPITAL_COMMUNITY): Payer: Self-pay

## 2024-07-17 DIAGNOSIS — M62830 Muscle spasm of back: Secondary | ICD-10-CM

## 2024-07-18 ENCOUNTER — Other Ambulatory Visit (HOSPITAL_COMMUNITY): Payer: Self-pay

## 2024-07-18 ENCOUNTER — Other Ambulatory Visit: Payer: Self-pay

## 2024-07-18 ENCOUNTER — Ambulatory Visit: Payer: Self-pay

## 2024-07-18 MED ORDER — METHOCARBAMOL 500 MG PO TABS
500.0000 mg | ORAL_TABLET | Freq: Three times a day (TID) | ORAL | 0 refills | Status: DC | PRN
  Filled 2024-07-18: qty 20, 7d supply, fill #0

## 2024-07-24 ENCOUNTER — Other Ambulatory Visit (HOSPITAL_COMMUNITY): Payer: Self-pay

## 2024-07-24 ENCOUNTER — Other Ambulatory Visit: Payer: Self-pay

## 2024-07-24 ENCOUNTER — Encounter: Payer: Self-pay | Admitting: Oncology

## 2024-07-24 ENCOUNTER — Ambulatory Visit: Admitting: Family Medicine

## 2024-07-24 ENCOUNTER — Encounter: Payer: Self-pay | Admitting: Family Medicine

## 2024-07-24 VITALS — BP 100/40 | HR 65 | Temp 98.1°F | Wt 170.1 lb

## 2024-07-24 DIAGNOSIS — R051 Acute cough: Secondary | ICD-10-CM | POA: Diagnosis not present

## 2024-07-24 DIAGNOSIS — I10 Essential (primary) hypertension: Secondary | ICD-10-CM | POA: Diagnosis not present

## 2024-07-24 DIAGNOSIS — M62830 Muscle spasm of back: Secondary | ICD-10-CM

## 2024-07-24 DIAGNOSIS — M62838 Other muscle spasm: Secondary | ICD-10-CM | POA: Diagnosis not present

## 2024-07-24 MED ORDER — LOSARTAN POTASSIUM-HCTZ 50-12.5 MG PO TABS: 0.5000 | ORAL_TABLET | ORAL | Status: AC

## 2024-07-24 MED ORDER — PROMETHAZINE-DM 6.25-15 MG/5ML PO SYRP
2.5000 mL | ORAL_SOLUTION | Freq: Every evening | ORAL | 0 refills | Status: AC
  Filled 2024-07-24: qty 50, 20d supply, fill #0

## 2024-07-24 MED ORDER — METHOCARBAMOL 500 MG PO TABS
500.0000 mg | ORAL_TABLET | Freq: Three times a day (TID) | ORAL | 2 refills | Status: AC | PRN
  Filled 2024-07-24: qty 30, 10d supply, fill #0

## 2024-07-24 MED ORDER — DICLOFENAC SODIUM 1 % EX GEL: 2.0000 g | Freq: Four times a day (QID) | CUTANEOUS | Status: AC | PRN

## 2024-07-24 NOTE — Patient Instructions (Addendum)
 Skip losartan  hydrochlorothiazide  on Friday.  Then I would cut losartan  hydrochlorothiazide  in half and check your BP over the next few days.  If your BP stays below 120/60, then I would stop the medicine.  Update me as needed.  The cough should get better.

## 2024-07-24 NOTE — Progress Notes (Signed)
 On iron , aspirin  and plavix .  Still with dark stools but diarrhea resolved.  Most recent HGB 11.9.    Most recent CXR reassuring.  D/w pt.   Lower BP noted.  Still taking fluids.  Still making urine.  Not lightheaded.    Still with cough.  Post nasal drainage.  No fevers.  Cough medicine helped with sleep.    I had sent rx for robaxin .  Used prn for R lower back spasms.  No ADE on med.   Meds, vitals, and allergies reviewed.   ROS: Per HPI unless specifically indicated in ROS section   Nad Ncat Neck supple, no LA SEM with occ ectopy.   Ctab, no focal dec in BS Abd soft, no ttp Skin well perfused.  Recheck BP 102/42.

## 2024-07-27 DIAGNOSIS — M62838 Other muscle spasm: Secondary | ICD-10-CM | POA: Insufficient documentation

## 2024-07-27 NOTE — Assessment & Plan Note (Signed)
 I had sent rx for robaxin .  Used prn for R lower back spasms.  No ADE on med.  Continue as needed use.

## 2024-07-27 NOTE — Assessment & Plan Note (Signed)
 Lungs are clear.  No fevers.  Should resolve.  She can update me if it does not continue to improve.  Okay for outpatient follow-up.

## 2024-07-27 NOTE — Assessment & Plan Note (Signed)
 Likely overtreated.  Most recent hemoglobin reassuring at 11.9.  Discussed options. Skip losartan  hydrochlorothiazide  on Friday.  Then I would cut losartan  hydrochlorothiazide  in half and check BP over the next few days.  If your BP stays below 120/60, then I would stop the medicine.  Update me as needed.  She agrees to plan.

## 2024-07-30 ENCOUNTER — Ambulatory Visit (HOSPITAL_COMMUNITY)
Admission: RE | Admit: 2024-07-30 | Discharge: 2024-07-30 | Disposition: A | Discharge status: Home / Self Care | Source: Ambulatory Visit | Attending: Student in an Organized Health Care Education/Training Program | Admitting: Student in an Organized Health Care Education/Training Program

## 2024-07-30 DIAGNOSIS — I480 Paroxysmal atrial fibrillation: Secondary | ICD-10-CM | POA: Insufficient documentation

## 2024-07-30 DIAGNOSIS — Z95818 Presence of other cardiac implants and grafts: Secondary | ICD-10-CM | POA: Diagnosis not present

## 2024-07-30 MED ORDER — IOHEXOL 350 MG/ML SOLN
80.0000 mL | Freq: Once | INTRAVENOUS | Status: AC | PRN
  Administered 2024-07-30: 80 mL via INTRAVENOUS

## 2024-08-01 ENCOUNTER — Encounter (HOSPITAL_COMMUNITY): Payer: Self-pay

## 2024-08-04 ENCOUNTER — Other Ambulatory Visit (HOSPITAL_COMMUNITY): Payer: Self-pay

## 2024-08-04 ENCOUNTER — Inpatient Hospital Stay

## 2024-08-04 ENCOUNTER — Other Ambulatory Visit: Payer: Self-pay

## 2024-08-05 ENCOUNTER — Inpatient Hospital Stay: Payer: Self-pay

## 2024-08-05 ENCOUNTER — Inpatient Hospital Stay: Admitting: Oncology

## 2024-08-05 ENCOUNTER — Telehealth: Payer: Self-pay | Admitting: Cardiology

## 2024-08-05 ENCOUNTER — Inpatient Hospital Stay

## 2024-08-05 NOTE — Assessment & Plan Note (Signed)
 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 Normal folate. Normal haptoglobin.  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   Lab Results  Component Value Date   HGB 11.9 (L) 07/14/2024   TIBC 279 04/25/2024   IRONPCTSAT 35 (H) 04/25/2024   FERRITIN 56 04/25/2024    I recommend IV venofer  to improve iron  store.  I discussed the potential risks including but not limited to allergic reactions/infusion reactions including anaphylactic reactions, diarrhea, phlebitis, high blood pressure, wheezing, SOB, skin rash, weight gain,dark urine, leg swelling, back pain, headache, nausea and fatigue, etc. Patient agrees witht plan. Plan IV venofer  weekly x 3 If no improvement of Hb, consider erythropoietin therapy replacement. Rationale and side effects were reviewed with patient.  She agrees with the plan.

## 2024-08-05 NOTE — Telephone Encounter (Signed)
 Can you please check on this, I will also forward it to her PCP

## 2024-08-05 NOTE — Telephone Encounter (Signed)
 Randine from [facility] calling to report findings on patient  Call transferred to Triage

## 2024-08-05 NOTE — Telephone Encounter (Signed)
 Radiologist  tech called to  give results of the over read for CCTA  IMPRESSION: *There is an irregular 4 x 9 mm airspace opacity in the left lung lower lobe, inferolaterally, adjacent to a pulmonary vessel. This is nonspecific and most likely residual lung parenchymal opacities seen on the recent prior exam from 12/10/2023. However, consider short-term follow-up examination in 3-6 months to document resolution and exclude underlying neoplastic process.     Information has been sent to cardiologist for review an structural team

## 2024-08-06 NOTE — Telephone Encounter (Signed)
 Please call pt.  She has a 4 x 9 mm airspace opacity in the left lung.  This is nonspecific and most likely residual changes seen on the recent prior xray from 12/10/2023.   I would get a follow-up CT in 3 months to document resolution and make sure it isn't changing/enlarging.    I put a note in the EMR to order the CT in April 2026.   Thanks.   ---------- Also routed to dr. Ladona as FYI.

## 2024-08-06 NOTE — Telephone Encounter (Signed)
 Called patient reviewed all information and repeated back to me. Will call if any questions.   She will make note of CT in 3 months as well. She will call if any further questions.

## 2024-08-13 ENCOUNTER — Encounter: Payer: Self-pay | Admitting: Family Medicine

## 2024-08-15 ENCOUNTER — Inpatient Hospital Stay: Payer: Self-pay

## 2024-08-15 ENCOUNTER — Inpatient Hospital Stay: Payer: Self-pay | Admitting: Oncology

## 2024-08-15 ENCOUNTER — Inpatient Hospital Stay: Payer: Self-pay | Attending: Oncology

## 2024-08-15 ENCOUNTER — Other Ambulatory Visit (HOSPITAL_COMMUNITY): Payer: Self-pay

## 2024-08-15 ENCOUNTER — Ambulatory Visit: Payer: Self-pay | Admitting: Oncology

## 2024-08-15 ENCOUNTER — Encounter: Payer: Self-pay | Admitting: Oncology

## 2024-08-15 VITALS — BP 133/60 | HR 73 | Temp 96.8°F | Resp 18 | Wt 175.7 lb

## 2024-08-15 DIAGNOSIS — N1832 Chronic kidney disease, stage 3b: Secondary | ICD-10-CM

## 2024-08-15 DIAGNOSIS — D649 Anemia, unspecified: Secondary | ICD-10-CM

## 2024-08-15 DIAGNOSIS — D631 Anemia in chronic kidney disease: Secondary | ICD-10-CM

## 2024-08-15 LAB — CBC WITH DIFFERENTIAL (CANCER CENTER ONLY)
Abs Immature Granulocytes: 0.09 10*3/uL — ABNORMAL HIGH (ref 0.00–0.07)
Basophils Absolute: 0.1 10*3/uL (ref 0.0–0.1)
Basophils Relative: 1 %
Eosinophils Absolute: 0.4 10*3/uL (ref 0.0–0.5)
Eosinophils Relative: 5 %
HCT: 33.2 % — ABNORMAL LOW (ref 36.0–46.0)
Hemoglobin: 11 g/dL — ABNORMAL LOW (ref 12.0–15.0)
Immature Granulocytes: 1 %
Lymphocytes Relative: 13 %
Lymphs Abs: 1 10*3/uL (ref 0.7–4.0)
MCH: 30.8 pg (ref 26.0–34.0)
MCHC: 33.1 g/dL (ref 30.0–36.0)
MCV: 93 fL (ref 80.0–100.0)
Monocytes Absolute: 0.9 10*3/uL (ref 0.1–1.0)
Monocytes Relative: 11 %
Neutro Abs: 5.5 10*3/uL (ref 1.7–7.7)
Neutrophils Relative %: 69 %
Platelet Count: 358 10*3/uL (ref 150–400)
RBC: 3.57 MIL/uL — ABNORMAL LOW (ref 3.87–5.11)
RDW: 15.1 % (ref 11.5–15.5)
WBC Count: 7.9 10*3/uL (ref 4.0–10.5)
nRBC: 0 % (ref 0.0–0.2)

## 2024-08-15 LAB — IRON AND TIBC
Iron: 107 ug/dL (ref 28–170)
Saturation Ratios: 42 % — ABNORMAL HIGH (ref 10.4–31.8)
TIBC: 253 ug/dL (ref 250–450)
UIBC: 146 ug/dL

## 2024-08-15 LAB — RETIC PANEL
Immature Retic Fract: 17.5 % — ABNORMAL HIGH (ref 2.3–15.9)
RBC.: 3.52 MIL/uL — ABNORMAL LOW (ref 3.87–5.11)
Retic Count, Absolute: 126 10*3/uL (ref 19.0–186.0)
Retic Ct Pct: 3.6 % — ABNORMAL HIGH (ref 0.4–3.1)
Reticulocyte Hemoglobin: 33.1 pg

## 2024-08-15 LAB — FERRITIN: Ferritin: 251 ng/mL (ref 11–307)

## 2024-08-15 NOTE — Progress Notes (Signed)
 " Hematology/Oncology Progress note [phone] [phone]           REFERRING PROVIDER: [name], MD   CHIEF COMPLAINTS/REASON FOR VISIT:  Anemia in CKD   ASSESSMENT & PLAN:   Anemia in chronic kidney disease (CKD) Anemia is likely due to chronic kidney disease, can not rule out MDS Negative M protein, normal light chain ratio. Normal folate. Normal haptoglobin.  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   Lab Results  Component Value Date   HGB 11.0 (L) 08/15/2024   TIBC 253 08/15/2024   IRONPCTSAT 42 (H) 08/15/2024   FERRITIN 251 08/15/2024    S/p IV venofer .  Hemoglobin has improved and is >10, no need for retacrit.  Ferritin is >200, at goal    Chronic kidney disease, stage 3b (HCC) Encourage oral hydration and avoid nephrotoxins.    Orders Placed This Encounter  Procedures   CBC (Cancer Center Only)    Standing Status:   Future    Expected Date:   12/13/2024    Expiration Date:   03/13/2025   Iron  and TIBC    Standing Status:   Future    Expected Date:   12/13/2024    Expiration Date:   03/13/2025   Ferritin    Standing Status:   Future    Expected Date:   12/13/2024    Expiration Date:   03/13/2025   Follow up 4 months All questions were answered. The patient knows to call the clinic with any problems, questions or concerns.  Zelphia Cap, MD, PhD [facility] Hematology Oncology 08/15/2024   HISTORY OF PRESENTING ILLNESS:   Dana Sandoval is a  84 y.o.  female with PMH listed below was seen in consultation at the request of  [name], MD  for evaluation of anemia.  Discussed the use of AI scribe software for clinical note transcription with the patient, who gave verbal consent to proceed.   She has a history of chronic anemia with fluctuating hemoglobin levels since 2012. Her hemoglobin dropped to a critically low level of 4.8 g/dL in June 2025, necessitating a blood transfusion.  Despite undergoing a colonoscopy, endoscopy, and capsule study, no active gastrointestinal bleeding was identified.  Her hemoglobin improved post-transfusion but began to decline again two weeks ago. No current symptoms of shortness of breath, lightheadedness, or chest pain. She is currently taking iron  supplements twice daily.  She has severe aortic valve stenosis. June 2025 She underwent aortic valve replacement   Her creatinine was 1.11 mg/dL and her estimated GFR was 49 mL/min/1.29m as of August 2025. In June 2025, her creatinine was 1.17 mg/dL and her eGFR was 46 fO/fpw/8.26f. She was previously on Lasix  but is now taking losartan  and chlorothiazide.  She has a history of atrial fibrillation and is on Eliquis  5 mg twice daily, which she has been off for two weeks per cardiology advise. She has no history of blood clots. She also has macular degeneration and is scheduled for an ophthalmology appointment in November 2025.   INTERVAL HISTORY Dana Sandoval is a 85 y.o. female who has above history reviewed by me today presents for follow up visit for  anemia in CKD.  She tolerated IV venofer .  She feels ok today. Chronic fatigue.   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 Wonda and Dr. Shyrl   Severe aortic stenosis    Thyroid  disease    HYPOTHYROIDISM    SURGICAL HISTORY: Past Surgical History:  Procedure Laterality Date   ABDOMINAL HYSTERECTOMY  1977   TAH  (FIBROIDS)   CATARACT EXTRACTION Bilateral    COLONOSCOPY N/A 12/12/2023   Procedure: COLONOSCOPY;  Surgeon: Kristie Lamprey, MD;  Location: [facility] ENDOSCOPY;  Service: Gastroenterology;  Laterality: N/A;    DILATION AND CURETTAGE OF UTERUS     DOPPLER ECHOCARDIOGRAPHY  2009   HAD AN ECHOCARDIOGRAM THAT SHOWED MODERATE MITRAL  ANNULAR CALCIFICATION AND A NORMAL EJECTION FRACTION OF 55%   ESOPHAGOGASTRODUODENOSCOPY N/A 12/12/2023   Procedure: EGD (ESOPHAGOGASTRODUODENOSCOPY);  Surgeon: Kristie Lamprey, MD;  Location: [facility] ENDOSCOPY;  Service: Gastroenterology;  Laterality: N/A;   GIVENS CAPSULE STUDY N/A 12/12/2023   Procedure: IMAGING PROCEDURE, GI TRACT, INTRALUMINAL, VIA CAPSULE;  Surgeon: Kristie Lamprey, MD;  Location: [facility] ENDOSCOPY;  Service: Gastroenterology;  Laterality: N/A;   INTRAOPERATIVE TRANSTHORACIC ECHOCARDIOGRAM N/A 01/01/2024   Procedure: ECHOCARDIOGRAM, TRANSTHORACIC;  Surgeon: Wonda Sharper, MD;  Location: [facility] INVASIVE CV LAB;  Service: Cardiovascular;  Laterality: N/A;   LEFT ATRIAL APPENDAGE OCCLUSION N/A 05/15/2024   Procedure: LEFT ATRIAL APPENDAGE OCCLUSION;  Surgeon: Almetta Donnice LABOR, MD;  Location: [facility] INVASIVE CV LAB;  Service: Cardiovascular;  Laterality: N/A;   RIGHT HEART CATH AND CORONARY ANGIOGRAPHY N/A 10/24/2023   Procedure: RIGHT HEART CATH AND CORONARY ANGIOGRAPHY;  Surgeon: Ladona Heinz, MD;  Location: [facility] INVASIVE CV LAB;  Service: Cardiovascular;  Laterality: N/A;   SHOULDER SURGERY     RIGHT SHOULDER SURGERY   THYROIDECTOMY     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   RIGHT   TOTAL KNEE ARTHROPLASTY Left 09/10/2018   Procedure: TOTAL KNEE ARTHROPLASTY;  Surgeon: Sheril Coy, MD;  Location: [facility] ORS;  Service: Orthopedics;  Laterality: Left;   TRANSESOPHAGEAL ECHOCARDIOGRAM (CATH LAB) N/A 05/15/2024   Procedure: TRANSESOPHAGEAL ECHOCARDIOGRAM;  Surgeon: Almetta Donnice LABOR, MD;  Location: [facility] INVASIVE CV LAB;  Service: Cardiovascular;  Laterality: N/A;    SOCIAL HISTORY: Social History   Socioeconomic History   Marital status: Married    Spouse name: Not on file   Number of children: 4   Years of education: Not on file   Highest education level: 12th grade   Occupational History   Not on file  Tobacco Use   Smoking status: Never   Smokeless tobacco: Never  Vaping Use   Vaping status: Never Used  Substance and Sexual Activity   Alcohol use: No    Alcohol/week: 0.0 standard drinks of alcohol   Drug use: No   Sexual activity: Yes    Partners: Male    Birth control/protection: Surgical    Comment: 1st intercourse- 75, partners- 1, married- 59 yrs   Other Topics Concern   Not on file  Social History Narrative   Lives with husband, Helayne FISHER- daughter, documents at home   Married 1960   Social Drivers of Health   Tobacco Use: Low Risk (08/15/2024)   Patient History    Smoking Tobacco Use: Never    Smokeless Tobacco Use: Never    Passive Exposure: Not on file  Financial Resource Strain: Low  Risk (12/10/2023)   Overall Financial Resource Strain (CARDIA)    Difficulty of Paying Living Expenses: Not hard at all  Food Insecurity: No Food Insecurity (05/16/2024)   Epic    Worried About Programme Researcher, Broadcasting/film/video in the Last Year: Never true    Ran Out of Food in the Last Year: Never true  Transportation Needs: No Transportation Needs (05/16/2024)   Epic    Lack of Transportation (Medical): No    Lack of Transportation (Non-Medical): No  Physical Activity: Inactive (12/10/2023)   Exercise Vital Sign    Days of Exercise per Week: 0 days    Minutes of Exercise per Session: 10 min  Stress: Stress Concern Present (12/10/2023)   [employer] of Occupational Health - Occupational Stress Questionnaire    Feeling of Stress : Very much  Social Connections: Unknown (01/01/2024)   Social Connection and Isolation Panel    Frequency of Communication with Friends and Family: More than three times a week    Frequency of Social Gatherings with Friends and Family: More than three times a week    Attends Religious Services: Not on Marketing Executive or Organizations: Not on file    Attends Banker Meetings: Not on file     Marital Status: Not on file  Recent Concern: Social Connections - Moderately Isolated (12/11/2023)   Social Connection and Isolation Panel    Frequency of Communication with Friends and Family: More than three times a week    Frequency of Social Gatherings with Friends and Family: More than three times a week    Attends Religious Services: Never    Database Administrator or Organizations: No    Attends Banker Meetings: Never    Marital Status: Married  Catering Manager Violence: Not At Risk (05/16/2024)   Epic    Fear of Current or Ex-Partner: No    Emotionally Abused: No    Physically Abused: No    Sexually Abused: No  Depression (PHQ2-9): Low Risk (07/24/2024)   Depression (PHQ2-9)    PHQ-2 Score: 0  Alcohol Screen: Low Risk (09/26/2022)   Alcohol Screen    Last Alcohol Screening Score (AUDIT): 0  Housing: Unknown (05/16/2024)   Epic    Unable to Pay for Housing in the Last Year: No    Number of Times Moved in the Last Year: Not on file    Homeless in the Last Year: No  Utilities: Not At Risk (05/16/2024)   Epic    Threatened with loss of utilities: No  Health Literacy: Not on file    FAMILY HISTORY: Family History  Problem Relation Age of Onset   Osteoporosis Mother    Heart disease Mother    Macular degeneration Mother    Heart disease Father    Cancer Father        BRAIN TUMOR   Cancer Brother        BRAIN TUMOR   Diabetes Maternal Aunt    Diabetes Maternal Uncle    Stroke Paternal Grandfather    Colon cancer Neg Hx    Breast cancer Neg Hx    Migraines Neg Hx     ALLERGIES:  is allergic to metformin  and related, codeine, and dilaudid [hydromorphone hcl].  MEDICATIONS:  Current Outpatient Medications  Medication Sig Dispense Refill   amiodarone  (PACERONE ) 200 MG tablet Take 0.5 tablets (100 mg total) by mouth daily. 45 tablet 2   Ascorbic Acid (VITAMIN C  PO) Take 500 mg by mouth in the morning.     aspirin  EC 81 MG tablet Take 1 tablet (81 mg  total) by mouth daily. Swallow whole. 90 tablet 3   atorvastatin  (LIPITOR) 40 MG tablet Take 1 tablet (40 mg total) by mouth daily at 6PM. 90 tablet 3   Cholecalciferol 50 MCG (2000 UT) TBDP Take 50 mcg by mouth daily.     clopidogrel  (PLAVIX ) 75 MG tablet Take 1 tablet (75 mg total) by mouth daily. 90 tablet 1   Cyanocobalamin  (VITAMIN B-12) 1000 MCG/15ML LIQD Take 1 mL by mouth 3 (three) times a week.     diclofenac  Sodium (VOLTAREN ) 1 % GEL Apply 2 g topically 4 (four) times daily as needed.     Docosanol (ABREVA) 10 % CREA Apply 1 application  topically as needed (fever blister).     ferrous sulfate  (FEROSUL) 325 (65 FE) MG tablet Take 1 tablet (325 mg total) by mouth 2 (two) times daily with a meal. 60 tablet 3   glucose blood (ONETOUCH VERIO) test strip use to test blood glucose levels in the morning, at noon, and at bedtime. 300 each 3   levothyroxine  (SYNTHROID ) 88 MCG tablet Take 1 tablet (88 mcg total) by mouth daily. 90 tablet 3   losartan -hydrochlorothiazide  (HYZAAR ) 50-12.5 MG tablet Take 0.5-1 tablets by mouth every morning.     methocarbamol  (ROBAXIN ) 500 MG tablet Take 1 tablet (500 mg total) by mouth every 8 (eight) hours as needed for muscle spasms. 30 tablet 2   Multiple Vitamins-Minerals (PRESERVISION AREDS 2 PO) Take 1 tablet by mouth 2 (two) times daily.      OneTouch Delica Lancets 33G MISC use one in the morning, at noon, and at bedtime to check blood glucose levels 300 each 3   pantoprazole  (PROTONIX ) 40 MG tablet Take 1 tablet (40 mg total) by mouth daily. 90 tablet 3   promethazine -dextromethorphan (PROMETHAZINE -DM) 6.25-15 MG/5ML syrup Take 2.5 mLs by mouth at bedtime. 50 mL 0   rOPINIRole  (REQUIP ) 0.25 MG tablet Take 1 tablet (0.25 mg total) by mouth at bedtime as needed. 90 tablet 3   valACYclovir  (VALTREX ) 500 MG tablet Take 1 tablet (500 mg total) by mouth daily as needed (fever blister). 30 tablet 2   amoxicillin  (AMOXIL ) 500 MG capsule Take 4 capsules (2,000 mg  total) by mouth 1 hour prior to dental work including cleanings as directed. (Patient not taking: Reported on 08/15/2024) 12 capsule 6   No current facility-administered medications for this visit.    Review of Systems  Constitutional:  Positive for fatigue. Negative for appetite change, chills and fever.  HENT:   Negative for hearing loss and voice change.   Eyes:  Negative for eye problems.  Respiratory:  Negative for chest tightness and cough.   Cardiovascular:  Negative for chest pain.  Gastrointestinal:  Negative for abdominal distention, abdominal pain and blood in stool.  Endocrine: Negative for hot flashes.  Genitourinary:  Negative for difficulty urinating and frequency.   Musculoskeletal:  Negative for arthralgias.  Skin:  Negative for itching and rash.  Neurological:  Negative for extremity weakness.  Hematological:  Negative for adenopathy.  Psychiatric/Behavioral:  Negative for confusion.    PHYSICAL EXAMINATION:  Vitals:   08/15/24 0830  BP: 133/60  Pulse: 73  Resp: 18  Temp: (!) 96.8 F (36 C)  SpO2: 100%   Filed Weights   08/15/24 0830  Weight: 175 lb 11.2 oz (79.7 kg)    Physical Exam  Constitutional:      General: She is not in acute distress. HENT:     Head: Normocephalic and atraumatic.  Eyes:     General: No scleral icterus. Cardiovascular:     Rate and Rhythm: Normal rate.  Pulmonary:     Effort: Pulmonary effort is normal. No respiratory distress.  Abdominal:     General: There is no distension.  Musculoskeletal:        General: Normal range of motion.     Cervical back: Normal range of motion and neck supple.  Skin:    Findings: No erythema or rash.  Neurological:     Mental Status: She is alert and oriented to person, place, and time. Mental status is at baseline.  Psychiatric:        Mood and Affect: Mood normal.     LABORATORY DATA:  I have reviewed the data as listed    Latest Ref Rng & Units 08/15/2024    8:01 AM 07/14/2024     4:16 PM 05/23/2024    2:28 PM  CBC  WBC 4.0 - 10.5 K/uL 7.9  7.8    Hemoglobin 12.0 - 15.0 g/dL 88.9  88.0  89.5   Hematocrit 36.0 - 46.0 % 33.2  34.4  31.5   Platelets 150 - 400 K/uL 358  303.0        Latest Ref Rng & Units 06/30/2024   11:42 AM 05/06/2024    9:43 AM 04/25/2024   11:48 AM  CMP  Glucose 70 - 99 mg/dL 875  857  838   BUN 8 - 27 mg/dL 17  23  14    Creatinine 0.57 - 1.00 mg/dL 8.72  8.80  8.65   Sodium 134 - 144 mmol/L 142  142  136   Potassium 3.5 - 5.2 mmol/L 4.1  4.2  3.8   Chloride 96 - 106 mmol/L 102  104  101   CO2 20 - 29 mmol/L 24  23  25    Calcium  8.7 - 10.3 mg/dL 8.7  9.1  8.5   Total Protein 6.5 - 8.1 g/dL   6.2   Total Bilirubin 0.0 - 1.2 mg/dL   1.0   Alkaline Phos 38 - 126 U/L   73   AST 15 - 41 U/L   39   ALT 0 - 44 U/L   34       RADIOGRAPHIC STUDIES: I have personally reviewed the radiological images as listed and agreed with the findings in the report. CT CARDIAC MORPH/PULM VEIN W/CM&W/O CA SCORE Addendum Date: 08/03/2024 ADDENDUM REPORT: 08/03/2024 09:33 EXAM: OVER-READ INTERPRETATION  CT CHEST The following report is an over-read performed by radiologist Dr. Ree Levy [facility] on 08/03/2024. This over-read does not include interpretation of cardiac or coronary anatomy or pathology. The coronary CTA interpretation by the cardiologist is attached. COMPARISON:  None. FINDINGS: Mediastinum/Nodes: No solid / cystic mediastinal masses. The visualized esophagus is nondistended precluding optimal assessment. No thoracic lymphadenopathy by size criteria. Lungs/Pleura: Imaged tracheo-bronchial tree is patent. There are dependent changes in bilateral lungs. No mass or consolidation. No pleural effusion or pneumothorax. There is an irregular 4 x 9 mm airspace opacity in the left lung lower lobe, inferolaterally, adjacent to a pulmonary vessel (series 305, image 48). This is nonspecific and most likely residual lung parenchymal opacities  seen on the recent prior exam from 12/10/2023. However, correlate clinically. Consider short-term follow-up examination in 3-6 months to document resolution and exclude underlying neoplastic process.  Upper Abdomen: Visualized upper abdominal viscera within normal limits. There is a tiny sliding hiatal hernia. Musculoskeletal: The visualized soft tissues of the chest wall are grossly unremarkable. No suspicious osseous lesions. IMPRESSION: *There is an irregular 4 x 9 mm airspace opacity in the left lung lower lobe, inferolaterally, adjacent to a pulmonary vessel. This is nonspecific and most likely residual lung parenchymal opacities seen on the recent prior exam from 12/10/2023. However, consider short-term follow-up examination in 3-6 months to document resolution and exclude underlying neoplastic process. These results will be called to the ordering clinician or representative by the Radiologist Assistant, and communication documented in the PACS or [employer]. Electronically Signed   By: Ree Molt M.D.   On: 08/03/2024 09:33   Result Date: 08/03/2024 CLINICAL DATA:  S/p LAA closure with 24 mm Watchman FLX 05/15/2024. Post surveillance imaging. EXAM: Cardiac CT/CTA TECHNIQUE: A non-contrast, gated CT scan was obtained with axial slices of 2.5 mm through the heart for calcium  scoring. Calcium  scoring was performed using the Agatston method. A 100 kV prospective, gated, contrast cardiac scan was obtained. Gantry rotation speed was 230 msec and collimation was 0.63 mm. Nitroglycerin  was not given. A delayed scan was obtained to exclude left atrial appendage thrombus. The 3D dataset was reconstructed in systole with motion correction. The 3D dataset was reconstructed at 5% intervals of the 25%-50% of the R-R cycle. Images were analyzed on a dedicated workstation using MPR, MIP, and VRT modes. The patient received 95 cc of contrast. FINDINGS: Image quality: excellent. Noise artifact is: Limited. LAA: A 24  mm Watchman FLX is present in the LAA. There is no apparent device leak or device-related thrombus. There is contrast opacification of the LAA, suggesting trans-fabric leak or delayed endothelialization. Left Atrium: The left atrial size is dilated. Small PFO. There is normal pulmonary vein drainage into the left atrium (3 on the right and 2 on the left). Coronary Arteries: Normal coronary origin. Right dominance. The study was performed without use of NTG and is insufficient for plaque evaluation. Right Atrium: Right atrial size is dilated. Contrast reflux into the IVC consistent with elevated RA pressure. Right Ventricle: The right ventricular cavity is within normal limits. Left Ventricle: The ventricular cavity size is within normal limits. Pulmonary Artery: Dilated pulmonary artery suggestive of pulmonary hypertension. Cardiac valves: A TAVR prosthesis is present with normal appearance. 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 There is evidence of P1/2 prolapse. Aorta: Normal caliber with no significant disease. Pericardium: Normal thickness with no significant effusion or calcium  present. Extra-cardiac findings: See attached radiology report for non-cardiac structures. IMPRESSION: 1. A 24 mm Watchman FLX is present in the LAA. There is no apparent device leak or device-related thrombus. There is contrast opacification of the LAA, suggesting trans-fabric leak or delayed endothelialization. 2. Dilated pulmonary artery suggestive of pulmonary hypertension. Contrast reflux into the IVC consistent with elevated RA pressure. 3. Severe biatrial enlargement. 4. S/p TAVR. 5. 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 There is evidence of P1/2 prolapse. Correlate with echocardiogram. Darryle T. Barbaraann, MD Electronically Signed: By: Darryle Barbaraann M.D. On: 07/30/2024 10:46   DG Chest 2 View Result Date: 07/22/2024 CLINICAL DATA:  Acute cough EXAM: CHEST - 2 VIEW  COMPARISON:  12/28/2023 FINDINGS: Resolved bibasilar atelectasis and trace effusions. Stable borderline cardiac enlargement. Remote TAVR noted. No focal pneumonia, collapse or consolidation. Negative for edema, effusion or pneumothorax. Trachea midline. Aorta atherosclerotic. Mitral valve annulus calcifications also  noted. Degenerative changes and scoliosis of the spine. Bones are osteopenic. IMPRESSION: 1. Resolved bibasilar atelectasis and trace effusions. 2. No acute chest process. Electronically Signed   By: CHRISTELLA.  Shick M.D.   On: 07/22/2024 14:19   Intravitreal Injection, Pharmacologic Agent - OD - Right Eye Result Date: 07/14/2024 Time Out 07/14/2024. 9:01 AM. Confirmed correct patient, procedure, site, and patient consented. Anesthesia Topical anesthesia was used. Anesthetic medications included Lidocaine  2%, Proparacaine 0.5%. Procedure Preparation included 5% betadine to ocular surface, eyelid speculum. A supplied (32g) needle was used. Injection: 6 mg faricimab -svoa 6 MG/0.05ML (Patient supplied)   Route: Intravitreal, Site: Right Eye   NDC: 49757-903-93, Lot: [id number], Expiration date: 04/08/2025, Waste: 0 mL Post-op Post injection exam found visual acuity of at least counting fingers. The patient tolerated the procedure well. There were no complications. The patient received written and verbal post procedure care education. Post injection medications were not given. Notes **PAP MEDICATION ADMINISTERED**   Intravitreal Injection, Pharmacologic Agent - OS - Left Eye Result Date: 07/14/2024 Time Out 07/14/2024. 9:01 AM. Confirmed correct patient, procedure, site, and patient consented. Anesthesia Topical anesthesia was used. Anesthetic medications included Lidocaine  2%, Proparacaine 0.5%. Procedure Preparation included 5% betadine to ocular surface, eyelid speculum. A supplied (33g) needle was used. Injection: 6 mg faricimab -svoa 6 MG/0.05ML (Patient supplied)   Route: Intravitreal, Site: Left Eye   NDC:  49757-903-93, Lot: [id number], Expiration date: 04/08/2025, Waste: 0 mL Post-op Post injection exam found visual acuity of at least counting fingers. The patient tolerated the procedure well. There were no complications. The patient received written and verbal post procedure care education. Notes **PAP MEDICATION ADMINISTERED**   OCT, Retina - OU - Both Eyes Result Date: 07/14/2024 Right Eye Quality was good. Central Foveal Thickness: 336. Progression has worsened. Findings include no SRF, abnormal foveal contour, retinal drusen , subretinal hyper-reflective material, intraretinal fluid, pigment epithelial detachment, outer retinal atrophy (persistent PED, trace IRF / cystic changes--slightly increased, and stable improvement in SRF overlying PED, +ORA). Left Eye Quality was good. Central Foveal Thickness: 214. Progression has been stable. Findings include normal foveal contour, no IRF, no SRF, retinal drusen , outer retinal atrophy (Stable improvement in focal central SRHM ). 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 CME - cystoid macular edema. PED - pigment epithelial detachment. IRF - intraretinal fluid. SRF - subretinal fluid. EZ - ellipsoid zone. ERM - epiretinal membrane. ORA - outer retinal atrophy. ORT - outer retinal tubulation. SRHM - subretinal hyper-reflective material. IRHM - intraretinal hyper-reflective material         "

## 2024-08-15 NOTE — Assessment & Plan Note (Addendum)
 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 Normal folate. Normal haptoglobin.  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   Lab Results  Component Value Date   HGB 11.0 (L) 08/15/2024   TIBC 253 08/15/2024   IRONPCTSAT 42 (H) 08/15/2024   FERRITIN 251 08/15/2024    S/p IV venofer .  Hemoglobin has improved and is >10, no need for retacrit.  Ferritin is >200, at goal

## 2024-08-15 NOTE — Assessment & Plan Note (Signed)
 Encourage oral hydration and avoid nephrotoxins.

## 2024-09-12 ENCOUNTER — Encounter (INDEPENDENT_AMBULATORY_CARE_PROVIDER_SITE_OTHER): Admitting: Ophthalmology

## 2024-10-06 ENCOUNTER — Ambulatory Visit

## 2024-10-07 ENCOUNTER — Other Ambulatory Visit

## 2024-10-16 ENCOUNTER — Encounter: Admitting: Family Medicine

## 2024-10-17 ENCOUNTER — Encounter: Admitting: Family Medicine

## 2024-11-18 ENCOUNTER — Ambulatory Visit: Admitting: Pulmonary Disease

## 2024-12-12 ENCOUNTER — Inpatient Hospital Stay: Admitting: Oncology

## 2024-12-12 ENCOUNTER — Inpatient Hospital Stay

## 2025-01-01 ENCOUNTER — Ambulatory Visit: Admitting: Physician Assistant

## 2025-01-01 ENCOUNTER — Other Ambulatory Visit (HOSPITAL_COMMUNITY)
